# Patient Record
Sex: Female | Born: 1980 | Race: White | Hispanic: No | Smoking: Current every day smoker
Health system: Southern US, Academic
[De-identification: ages and names within clinical notes are randomized; demographics above are authoritative.]

## PROBLEM LIST (undated history)

## (undated) DIAGNOSIS — IMO0002 Reserved for concepts with insufficient information to code with codable children: Secondary | ICD-10-CM

## (undated) DIAGNOSIS — R51 Headache: Secondary | ICD-10-CM

## (undated) DIAGNOSIS — B019 Varicella without complication: Secondary | ICD-10-CM

## (undated) DIAGNOSIS — N390 Urinary tract infection, site not specified: Secondary | ICD-10-CM

## (undated) DIAGNOSIS — J309 Allergic rhinitis, unspecified: Secondary | ICD-10-CM

## (undated) DIAGNOSIS — F419 Anxiety disorder, unspecified: Secondary | ICD-10-CM

## (undated) DIAGNOSIS — Q796 Ehlers-Danlos syndrome, unspecified: Secondary | ICD-10-CM

## (undated) DIAGNOSIS — G43909 Migraine, unspecified, not intractable, without status migrainosus: Secondary | ICD-10-CM

## (undated) DIAGNOSIS — K219 Gastro-esophageal reflux disease without esophagitis: Secondary | ICD-10-CM

## (undated) DIAGNOSIS — R519 Headache, unspecified: Secondary | ICD-10-CM

## (undated) DIAGNOSIS — M1990 Unspecified osteoarthritis, unspecified site: Secondary | ICD-10-CM

## (undated) DIAGNOSIS — T8853XA Unintended awareness under general anesthesia during procedure, initial encounter: Secondary | ICD-10-CM

## (undated) DIAGNOSIS — G90A Postural orthostatic tachycardia syndrome (POTS): Secondary | ICD-10-CM

## (undated) DIAGNOSIS — H9190 Unspecified hearing loss, unspecified ear: Secondary | ICD-10-CM

## (undated) DIAGNOSIS — H5111 Convergence insufficiency: Secondary | ICD-10-CM

## (undated) DIAGNOSIS — Z973 Presence of spectacles and contact lenses: Secondary | ICD-10-CM

## (undated) DIAGNOSIS — Z87898 Personal history of other specified conditions: Secondary | ICD-10-CM

## (undated) DIAGNOSIS — K3184 Gastroparesis: Secondary | ICD-10-CM

## (undated) DIAGNOSIS — N809 Endometriosis, unspecified: Secondary | ICD-10-CM

## (undated) DIAGNOSIS — M419 Scoliosis, unspecified: Secondary | ICD-10-CM

## (undated) DIAGNOSIS — G8929 Other chronic pain: Secondary | ICD-10-CM

## (undated) DIAGNOSIS — G901 Familial dysautonomia [Riley-Day]: Secondary | ICD-10-CM

## (undated) DIAGNOSIS — E041 Nontoxic single thyroid nodule: Secondary | ICD-10-CM

## (undated) DIAGNOSIS — D8989 Other specified disorders involving the immune mechanism, not elsewhere classified: Secondary | ICD-10-CM

## (undated) DIAGNOSIS — M530 Cervicocranial syndrome: Secondary | ICD-10-CM

## (undated) DIAGNOSIS — R0602 Shortness of breath: Secondary | ICD-10-CM

## (undated) DIAGNOSIS — R6889 Other general symptoms and signs: Secondary | ICD-10-CM

## (undated) DIAGNOSIS — G935 Compression of brain: Secondary | ICD-10-CM

## (undated) HISTORY — DX: Unspecified osteoarthritis, unspecified site: M19.90

## (undated) HISTORY — DX: Gastro-esophageal reflux disease without esophagitis: K21.9

## (undated) HISTORY — PX: MYOMECTOMY: SHX85

## (undated) HISTORY — DX: Reserved for concepts with insufficient information to code with codable children: IMO0002

## (undated) HISTORY — DX: Anxiety disorder, unspecified: F41.9

## (undated) HISTORY — DX: Headache: R51

## (undated) HISTORY — DX: Urinary tract infection, site not specified: N39.0

## (undated) HISTORY — DX: Migraine, unspecified, not intractable, without status migrainosus: G43.909

## (undated) HISTORY — DX: Varicella without complication: B01.9

## (undated) HISTORY — DX: Headache, unspecified: R51.9

## (undated) HISTORY — DX: Ehlers-Danlos syndrome, unspecified: Q79.60

## (undated) HISTORY — PX: CERVICAL POLYPECTOMY: SHX88

## (undated) HISTORY — PX: OTHER SURGICAL HISTORY: SHX169

## (undated) HISTORY — DX: Allergic rhinitis, unspecified: J30.9

## (undated) HISTORY — DX: Cervicocranial syndrome: M53.0

## (undated) HISTORY — PX: ESOPHAGEAL DILATION: SHX303

## (undated) HISTORY — PX: HX MYOMECTOMY: SHX85

## (undated) HISTORY — PX: HX WISDOM TEETH EXTRACTION: SHX21

## (undated) HISTORY — DX: Unspecified hearing loss, unspecified ear: H91.90

## (undated) HISTORY — DX: Endometriosis, unspecified: N80.9

## (undated) HISTORY — PX: HX LAP CHOLECYSTECTOMY: SHX56

## (undated) HISTORY — PX: POSTERIOR FOSSA DECOMPRESSION: SHX2249

## (undated) HISTORY — DX: Scoliosis, unspecified: M41.9

## (undated) HISTORY — DX: Nontoxic single thyroid nodule: E04.1

---
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
## 2000-04-16 ENCOUNTER — Other Ambulatory Visit: Admission: RE | Admit: 2000-04-16 | Discharge: 2000-04-16 | Payer: Self-pay | Admitting: *Deleted

## 2000-04-17 ENCOUNTER — Other Ambulatory Visit: Admission: RE | Admit: 2000-04-17 | Discharge: 2000-04-17 | Payer: Self-pay | Admitting: *Deleted

## 2000-05-29 ENCOUNTER — Ambulatory Visit (HOSPITAL_COMMUNITY): Admission: RE | Admit: 2000-05-29 | Discharge: 2000-05-29 | Payer: Self-pay | Admitting: *Deleted

## 2000-07-16 ENCOUNTER — Ambulatory Visit: Admission: RE | Admit: 2000-07-16 | Discharge: 2000-07-16 | Payer: Self-pay | Admitting: Gynecology

## 2000-09-17 ENCOUNTER — Other Ambulatory Visit: Admission: RE | Admit: 2000-09-17 | Discharge: 2000-09-17 | Payer: Self-pay | Admitting: Gynecology

## 2000-09-17 ENCOUNTER — Ambulatory Visit: Admission: RE | Admit: 2000-09-17 | Discharge: 2000-09-17 | Payer: Self-pay | Admitting: Gynecology

## 2001-01-27 ENCOUNTER — Other Ambulatory Visit: Admission: RE | Admit: 2001-01-27 | Discharge: 2001-01-27 | Payer: Self-pay | Admitting: Gynecology

## 2001-01-27 ENCOUNTER — Ambulatory Visit: Admission: RE | Admit: 2001-01-27 | Discharge: 2001-01-27 | Payer: Self-pay | Admitting: Gynecology

## 2001-05-19 ENCOUNTER — Other Ambulatory Visit: Admission: RE | Admit: 2001-05-19 | Discharge: 2001-05-19 | Payer: Self-pay | Admitting: Gynecologic Oncology

## 2001-05-19 ENCOUNTER — Ambulatory Visit: Admission: RE | Admit: 2001-05-19 | Discharge: 2001-05-19 | Payer: Self-pay | Admitting: Gynecologic Oncology

## 2001-11-04 ENCOUNTER — Ambulatory Visit: Admission: RE | Admit: 2001-11-04 | Discharge: 2001-11-04 | Payer: Self-pay | Admitting: Gynecology

## 2001-11-04 ENCOUNTER — Other Ambulatory Visit: Admission: RE | Admit: 2001-11-04 | Discharge: 2001-11-04 | Payer: Self-pay | Admitting: Gynecology

## 2002-06-16 ENCOUNTER — Ambulatory Visit: Admission: RE | Admit: 2002-06-16 | Discharge: 2002-06-16 | Payer: Self-pay | Admitting: Gynecology

## 2002-06-16 ENCOUNTER — Other Ambulatory Visit: Admission: RE | Admit: 2002-06-16 | Discharge: 2002-06-16 | Payer: Self-pay | Admitting: Gynecology

## 2005-05-16 ENCOUNTER — Emergency Department: Payer: Self-pay | Admitting: Unknown Physician Specialty

## 2005-05-18 ENCOUNTER — Ambulatory Visit: Payer: Self-pay | Admitting: Unknown Physician Specialty

## 2006-02-24 ENCOUNTER — Ambulatory Visit: Payer: Self-pay | Admitting: Gastroenterology

## 2006-03-11 ENCOUNTER — Ambulatory Visit: Payer: Self-pay | Admitting: Gastroenterology

## 2006-03-26 ENCOUNTER — Ambulatory Visit: Payer: Self-pay | Admitting: Gastroenterology

## 2007-11-24 ENCOUNTER — Ambulatory Visit: Payer: Self-pay | Admitting: Obstetrics and Gynecology

## 2007-11-27 ENCOUNTER — Ambulatory Visit: Payer: Self-pay | Admitting: Obstetrics and Gynecology

## 2012-09-01 DIAGNOSIS — R634 Abnormal weight loss: Secondary | ICD-10-CM | POA: Insufficient documentation

## 2012-09-01 DIAGNOSIS — N96 Recurrent pregnancy loss: Secondary | ICD-10-CM | POA: Insufficient documentation

## 2012-09-01 DIAGNOSIS — M2550 Pain in unspecified joint: Secondary | ICD-10-CM | POA: Insufficient documentation

## 2014-04-05 NOTE — Telephone Encounter (Signed)
Returned pt call. 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 She has been referred to an EDS specialist near [facility], but the waiting list is 23 months. She is wondering if there is any EDS specialist closer that may be able to see her before her scheduled decompression surgery with her neurosurgeon next month. I discussed that I was not aware of anyone in the area that specializes in EDS. I would be happy to research this for her, but I would recommend formal genetic testing first. Will refer to genetics here at [facility] for this.

## 2014-06-17 DIAGNOSIS — Q796 Ehlers-Danlos syndrome, unspecified: Secondary | ICD-10-CM

## 2014-06-17 DIAGNOSIS — Q828 Other specified congenital malformations of skin: Secondary | ICD-10-CM | POA: Insufficient documentation

## 2014-11-23 ENCOUNTER — Ambulatory Visit (INDEPENDENT_AMBULATORY_CARE_PROVIDER_SITE_OTHER): Payer: BLUE CROSS/BLUE SHIELD | Admitting: Family Medicine

## 2014-11-23 ENCOUNTER — Encounter: Payer: Self-pay | Admitting: Family Medicine

## 2014-11-23 VITALS — BP 116/82 | HR 91 | Temp 98.5°F | Ht 64.17 in | Wt 104.2 lb

## 2014-11-23 DIAGNOSIS — R109 Unspecified abdominal pain: Secondary | ICD-10-CM

## 2014-11-23 DIAGNOSIS — M62838 Other muscle spasm: Secondary | ICD-10-CM | POA: Diagnosis not present

## 2014-11-23 DIAGNOSIS — R51 Headache: Secondary | ICD-10-CM

## 2014-11-23 DIAGNOSIS — R258 Other abnormal involuntary movements: Secondary | ICD-10-CM

## 2014-11-23 DIAGNOSIS — R519 Headache, unspecified: Secondary | ICD-10-CM

## 2014-11-23 DIAGNOSIS — Q796 Ehlers-Danlos syndrome, unspecified: Secondary | ICD-10-CM

## 2014-11-23 DIAGNOSIS — R253 Fasciculation: Secondary | ICD-10-CM

## 2014-11-23 DIAGNOSIS — R002 Palpitations: Secondary | ICD-10-CM | POA: Diagnosis not present

## 2014-11-23 DIAGNOSIS — R350 Frequency of micturition: Secondary | ICD-10-CM | POA: Diagnosis not present

## 2014-11-23 DIAGNOSIS — G8929 Other chronic pain: Secondary | ICD-10-CM

## 2014-11-23 LAB — POCT URINALYSIS DIPSTICK
Bilirubin, UA: NEGATIVE
Glucose, UA: NEGATIVE
Ketones, UA: NEGATIVE
LEUKOCYTES UA: NEGATIVE
NITRITE UA: NEGATIVE
PH UA: 6
PROTEIN UA: NEGATIVE
Spec Grav, UA: 1.02
UROBILINOGEN UA: 0.2

## 2014-11-23 LAB — BASIC METABOLIC PANEL
BUN: 17 mg/dL (ref 6–23)
CHLORIDE: 106 meq/L (ref 96–112)
CO2: 31 meq/L (ref 19–32)
CREATININE: 0.68 mg/dL (ref 0.40–1.20)
Calcium: 9.2 mg/dL (ref 8.4–10.5)
GFR: 104.85 mL/min (ref >60.00)
GLUCOSE: 79 mg/dL (ref 70–99)
Potassium: 4 mEq/L (ref 3.5–5.1)
Sodium: 141 mEq/L (ref 135–145)

## 2014-11-23 LAB — URINALYSIS, MICROSCOPIC ONLY

## 2014-11-23 LAB — POCT URINE PREGNANCY: PREG TEST UR: NEGATIVE

## 2014-11-23 NOTE — Progress Notes (Signed)
Pre visit review using our clinic review tool, if applicable. No additional management support is needed unless otherwise documented below in the visit note. 

## 2014-11-23 NOTE — Progress Notes (Signed)
Patient ID: Belinda Bryant, female   DOB: [birth year], 34 y.o.   MRN: [id number]  Belinda Rumps, MD Phone: [phone]  Belinda Bryant is a 34 y.o. female who presents today for new patient visit.   Patient notes a number of chronic issues today. These are unchanged in many years. She notes the only new issue is muscle twitching in her right arm that occurred several weeks ago and lasted for 4 days. She notes this occurred following being in bright lights in a store. She notes she feels like this is going to come on when she is in stores since that time. Has not recurred in several days. She noted a numb sensation in her posterior neck with this. She notes no numbness since the initial episode. No pain with this. No LOC. No generalized shaking. No post ictal issues. No incontinence with this.   Notes she has a history of chiari malformation. Has had headaches for decades and was recently diagnosed with chiari. 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 She notes constant headaches with this. She has had balance issues in the past. Bright lights bother her. She has been on fiorecet for headaches with some benefit. She has been to a nutritionist who tested a number of vitamin and minerals. Has anxiety and gets palpitations with this. No CP or SOB with this. Notes she gets flushed intermittently on standing and has light headedness on standing. 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 Had an echo that reportedly was normal. Has seen geneticist for her issues. Notes swallowing issues with solids. Has had EGD and colo, gastric emptying study, and barium swallow that were reportedly normal. Notes chronic abdominal pain due to fibroids and endometriosis. Has history of painful intercourse. Has seen GI and Gyn for these issues. Reports having CT abd/pelvis and [place] to  evaluate this with no apparent cause found. She notes all of these issues are chronic and stable. She is her today to meet a new PCP and for assistance in coordination of care.   Active Ambulatory Problems    Diagnosis Date Noted  . Muscle twitching 11/23/2014  . Headache 11/24/2014  . Chronic abdominal pain 11/24/2014  . Ehlers-Danlos disease 11/24/2014  . Palpitations 11/24/2014   Resolved Ambulatory Problems    Diagnosis Date Noted  . No Resolved Ambulatory Problems   Past Medical History  Diagnosis Date  . Arthritis   . Chicken pox   . Chiari malformation   . GERD (gastroesophageal reflux disease)   . Allergic rhinitis   . Migraine   . UTI (lower urinary tract infection)     Family History  Problem Relation Age of Onset  . Arthritis      parents, gradnparents, siblings  . Lung cancer      grandparent  . Hypertension      parent    Social History   Social History  . Marital Status: Single    Spouse Name: N/A  . Number of Children: N/A  . Years of Education: N/A   Occupational History  . Not on file.   Social History Main Topics  . Smoking status: Current Every Day Smoker  . Smokeless tobacco: Not on file  . Alcohol Use: No  . Drug Use: Yes     Comment: Marijuana  . Sexual Activity: Not on file  Other Topics Concern  . Not on file   Social History Narrative    ROS   General:  Positive for unexplained weight loss, Negative for fever Skin: Negative for new or changing mole, sore that won't heal HEENT: positive for trouble hearing, trouble seeing, ringing in ears, mouth sores, change in voice, dysphagia. CV:  Negative for chest pain, dyspnea, positive for palpitations Resp: positive for cough, Negative for dyspnea, hemoptysis GI: positive for nausea and abdominal pain, Negative for vomiting, diarrhea, constipation, melena, hematochezia. GU: positive for frequent urination, sexual difficulty, Negative for dysuria, incontinence, urinary hesitance,  hematuria, vaginal or penile discharge, polyuria, lumps in testicle or breasts MSK: positive for muscle cramps or aches, joint pain or swelling Neuro: Positive for headaches, weakness, numbness, dizziness, negative for passing out/fainting Psych: Negative for depression, positive for anxiety, memory problems  Objective  Physical Exam Filed Vitals:   11/23/14 0858  BP: 116/82  Pulse: 91  Temp: 98.5 F (36.9 C)   Lying BP 102/70 P 53 Sitting BP 96/68 P 57 Standing BP 96/68 P 53  Physical Exam  Constitutional:  Well appearing, thin, NAD  HENT:  Head: Normocephalic and atraumatic.  Right Ear: External ear normal.  Left Ear: External ear normal.  Mouth/Throat: No oropharyngeal exudate.  Eyes: Conjunctivae are normal. Pupils are equal, round, and reactive to light.  Neck: Neck supple.  Cardiovascular: Normal rate, regular rhythm and normal heart sounds.  Exam reveals no gallop and no friction rub.   No murmur heard. Pulmonary/Chest: Effort normal and breath sounds normal. No respiratory distress. She has no wheezes. She has no rales.  Abdominal: Soft. Bowel sounds are normal. She exhibits no distension and no mass. There is no tenderness. There is no rebound and no guarding.  Lymphadenopathy:    She has no cervical adenopathy.  Neurological: She is alert. Gait normal.  CN 2-12 intact, 5/5 strength in bilateral biceps, triceps, grip, quads, hamstrings, plantar and dorsiflexion, sensation to light touch intact in bilateral UE and LE, normal gait, 2+ patellar reflexes  Skin: Skin is warm and dry.  Psychiatric:  Mood anxious, affect anxious  Extremities WWP  EKG: sinus bradycardia, rate 52, no ST or T wave changes  Assessment/Plan:   Muscle twitching Patient with episode of muscle twitching recently. Mostly in RUE, though some in LUE. No other abnormal movements. No tonic clonic movements. No LOC. No post ictal phase. Unlikely seizure activity. Could be dehydration related or  electrolyte imbalance. Could be related to her chiari malformation. Neurologically intact. Will check BMET. Will refer to neurology for management of her headaches and for further evaluation of these twitches.   Headache Likely related to chiari malformation. Is neurologically intact. Will request records from [facility] from her neurosurgeon. Will refer to neurology at [facility] for further management of headaches. Given return precautions.   Chronic abdominal pain Chronic issue. Sounds like she has had an extensive work up for this with endoscopy, colonoscopy, CT scan, and [place]. Has seen GI and Gyn for this and has had several pelvic operations for fibroids and polyps. Also has swallowing issues, though has had extensive work up with barium swallow and gastric emptying study. Could be related to [employer]. UA with no signs of infection. Trace blood likely related to patient being on her period. Upreg negative. Benign exam today. Patient would like to continue to monitor this and consider referral to GI in the future. Given return precautions.   Ehlers-Danlos disease Has been evaluated by genetics  already. Will refer to Dr Oneida Alar at [facility] in [place] for further management of this issue.   Palpitations Occur mostly when anxious. Likely related to anxiety. No other cardiac symptoms. EKG with sinus brady, otherwise reassuring. 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 Discussed further evaluation with cardiology for this issue, though patient declined this opting to monitor and if remains persistent by her next visit she will consider referral. Given return precautions.     Orders Placed This Encounter  Procedures  . Basic Metabolic Panel (BMET)  . Urine Microscopic Only  . Ambulatory referral to Sports Medicine    Referral Priority:  Routine    Referral Type:  Consultation    Number of Visits Requested:  1  . Ambulatory referral to Neurology    Referral Priority:  Routine    Referral Type:   Consultation    Referral Reason:  Specialty Services Required    Requested Specialty:  Neurology    Number of Visits Requested:  1  . POCT Urinalysis Dipstick  . POCT urine pregnancy  . EKG 12-Lead    Belinda Bryant

## 2014-11-23 NOTE — Patient Instructions (Signed)
Nice to meet you. We will refer you to neurology for further management of your headaches and for further evaluation of your chiari malformation.  We will refer you to Dr Oneida Alar in [place] for your [employer]. If you have a change in your symptoms, nausea, vomiting, diarrhea, blood in your stool, abdominal pain, worsening headache, numbness, weakness, vision changes, tremor, shaking, loss of consciousness, vaginal discharge, or vaginal bleeding, or fever, please seek medical attention.

## 2014-11-24 ENCOUNTER — Encounter: Payer: Self-pay | Admitting: Family Medicine

## 2014-11-24 DIAGNOSIS — R109 Unspecified abdominal pain: Secondary | ICD-10-CM

## 2014-11-24 DIAGNOSIS — R002 Palpitations: Secondary | ICD-10-CM | POA: Insufficient documentation

## 2014-11-24 DIAGNOSIS — Q796 Ehlers-Danlos syndrome, unspecified: Secondary | ICD-10-CM | POA: Insufficient documentation

## 2014-11-24 DIAGNOSIS — R519 Headache, unspecified: Secondary | ICD-10-CM | POA: Insufficient documentation

## 2014-11-24 DIAGNOSIS — R51 Headache: Secondary | ICD-10-CM

## 2014-11-24 DIAGNOSIS — G8929 Other chronic pain: Secondary | ICD-10-CM | POA: Insufficient documentation

## 2014-11-24 NOTE — Assessment & Plan Note (Signed)
Has been evaluated by genetics already. Will refer to Dr Oneida Alar at [facility] in [place] for further management of this issue.

## 2014-11-24 NOTE — Assessment & Plan Note (Signed)
Likely related to chiari malformation. Is neurologically intact. Will request records from [facility] from her neurosurgeon. Will refer to neurology at [facility] for further management of headaches. Given return precautions.

## 2014-11-24 NOTE — Assessment & Plan Note (Signed)
Occur mostly when anxious. Likely related to anxiety. No other cardiac symptoms. EKG with sinus brady, otherwise reassuring. 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 Discussed further evaluation with cardiology for this issue, though patient declined this opting to monitor and if remains persistent by her next visit she will consider referral. Given return precautions.

## 2014-11-24 NOTE — Assessment & Plan Note (Signed)
Patient with episode of muscle twitching recently. Mostly in RUE, though some in LUE. No other abnormal movements. No tonic clonic movements. No LOC. No post ictal phase. Unlikely seizure activity. Could be dehydration related or electrolyte imbalance. Could be related to her chiari malformation. Neurologically intact. Will check BMET. Will refer to neurology for management of her headaches and for further evaluation of these twitches.

## 2014-11-24 NOTE — Assessment & Plan Note (Addendum)
Chronic issue. Sounds like she has had an extensive work up for this with endoscopy, colonoscopy, CT scan, and [place]. Has seen GI and Gyn for this and has had several pelvic operations for fibroids and polyps. Also has swallowing issues, though has had extensive work up with barium swallow and gastric emptying study. Could be related to [employer]. UA with no signs of infection. Trace blood likely related to patient being on her period. Upreg negative. Benign exam today. Patient would like to continue to monitor this and consider referral to GI in the future. Given return precautions.

## 2014-12-07 ENCOUNTER — Ambulatory Visit (INDEPENDENT_AMBULATORY_CARE_PROVIDER_SITE_OTHER): Payer: BLUE CROSS/BLUE SHIELD | Admitting: Family Medicine

## 2014-12-07 ENCOUNTER — Encounter: Payer: Self-pay | Admitting: Family Medicine

## 2014-12-07 VITALS — BP 96/58 | HR 75 | Temp 98.7°F | Ht 64.17 in | Wt 102.0 lb

## 2014-12-07 DIAGNOSIS — R519 Headache, unspecified: Secondary | ICD-10-CM

## 2014-12-07 DIAGNOSIS — R258 Other abnormal involuntary movements: Secondary | ICD-10-CM

## 2014-12-07 DIAGNOSIS — R253 Fasciculation: Secondary | ICD-10-CM

## 2014-12-07 DIAGNOSIS — M62838 Other muscle spasm: Secondary | ICD-10-CM | POA: Diagnosis not present

## 2014-12-07 DIAGNOSIS — E559 Vitamin D deficiency, unspecified: Secondary | ICD-10-CM | POA: Diagnosis not present

## 2014-12-07 DIAGNOSIS — Q796 Ehlers-Danlos syndrome, unspecified: Secondary | ICD-10-CM

## 2014-12-07 DIAGNOSIS — R05 Cough: Secondary | ICD-10-CM

## 2014-12-07 DIAGNOSIS — R51 Headache: Secondary | ICD-10-CM

## 2014-12-07 DIAGNOSIS — R059 Cough, unspecified: Secondary | ICD-10-CM

## 2014-12-07 DIAGNOSIS — J309 Allergic rhinitis, unspecified: Secondary | ICD-10-CM | POA: Insufficient documentation

## 2014-12-07 DIAGNOSIS — G8929 Other chronic pain: Secondary | ICD-10-CM

## 2014-12-07 DIAGNOSIS — F419 Anxiety disorder, unspecified: Secondary | ICD-10-CM

## 2014-12-07 LAB — VITAMIN D 25 HYDROXY (VIT D DEFICIENCY, FRACTURES): VITD: 25.36 ng/mL — AB (ref 30.00–100.00)

## 2014-12-07 LAB — MAGNESIUM: MAGNESIUM: 2.1 mg/dL (ref 1.5–2.5)

## 2014-12-07 LAB — IRON: Iron: 35 ug/dL — ABNORMAL LOW (ref 42–145)

## 2014-12-07 MED ORDER — CLONAZEPAM 1 MG PO TABS: 1.0000 mg | ORAL_TABLET | Freq: Two times a day (BID) | ORAL | Status: DC | PRN

## 2014-12-07 MED ORDER — CLONAZEPAM 0.5 MG PO TABS: 0.5000 mg | ORAL_TABLET | Freq: Two times a day (BID) | ORAL | Status: DC | PRN

## 2014-12-07 NOTE — Assessment & Plan Note (Signed)
Check vitamin D today. 

## 2014-12-07 NOTE — Assessment & Plan Note (Signed)
No recurrence. We'll check magnesium and iron level.

## 2014-12-07 NOTE — Assessment & Plan Note (Signed)
Stable at this time. Patient has an appointment next month with sports medicine. She did ask whether or not she should wear her soft neck brace. I advised her that she should wear this and that she should contact her neurosurgeon to obtain their advice on this and for further recommendations on this. Advised that she should wear the soft neck brace until she heard back from the neurosurgeon. She was given return precautions.

## 2014-12-07 NOTE — Assessment & Plan Note (Addendum)
This is a persistent intermittent issue that only occurs at night. She has had benefit from Prilosec and Zantac in the past. She's not taking these at this time. She has no shortness of breath with this. She does note an incline has helped. I did have a discussion with patient regarding possible workup of this considering that she does not feel well rested in the morning. We did discuss sleep study though she declined this at this time opting to monitor. Discussed Prilosec and Zantac for this though she preferred to continue to monitor at this time as the inclined bed has helped her. Given return precautions.

## 2014-12-07 NOTE — Assessment & Plan Note (Signed)
Stable at this time. Based on PHQ 9 scoring she does appear to have some level of depression with this. She has been tried on SSRIs previously and they've not been beneficial. Discussed that there are other options for treatment, though patient opted to continue with Klonopin and therapy. She has no SI or HI at this time. Refill given for Klonopin 1 mg twice a day when necessary for anxiety. Given return precautions.

## 2014-12-07 NOTE — Patient Instructions (Addendum)
Nice to see you. We will attempt to get you in to a neurologist sooner than you February appointment.  Please call your neurosurgeon at [facility] to ask about the neck brace. I would favor wearing this until you discuss with them.  Please keep follow-up with the therapist.  If you develop worsening headaches, neck pain, numbness, weakness, tingling, nausea, vomiting, thoughts of harming yourself or others, or any new symptoms please seek medical attention.

## 2014-12-07 NOTE — Progress Notes (Signed)
Pre visit review using our clinic review tool, if applicable. No additional management support is needed unless otherwise documented below in the visit note. 

## 2014-12-07 NOTE — Assessment & Plan Note (Addendum)
Continues to have persistent headache. She has no acute neurologic changes and notes that the changes in sensation in her fingertips and toes and left thigh have been chronic issues for greater than a year. She had an appointment scheduled with neurology though this is in February and patient and I would prefer that she be seen sooner than that. We will refer her to [facility] to see if they can get her in sooner and if not we'll refer her to local neurologist for further evaluation. She will continue Fioricet as needed for headaches. She was given return precautions.

## 2014-12-07 NOTE — Progress Notes (Signed)
Patient ID: Belinda Bryant, female   DOB: November 03, 1980, 34 y.o.   MRN: [id number]  [name], MD Phone: [phone]  Belinda Bryant is a 34 y.o. female who presents today for follow-up.  Headaches: Patient notes long history of these related to her Chiari malformation. These are chronic in nature. She's had decade's worth of headaches. She notes her headaches have progressively gotten worse over a number of years. She notes today that are stable compared to her last visit. She does note continued sensory issues with regards to light. She denies any more twitching episodes. She denies any numbness or weakness, though then she states there is an area in her left thigh that has been decreased in sensation for years and that she had an injection in that thigh that resulted in this decreased sensation. She notes this area has been improving. She also notes that her fingertips and toes feel cold and intermittently numb bilaterally. This is been an issue for greater than a year. She denies any numbness anywhere else. She has been taking Fioricet for her headaches with some benefit. She notes the soonest they could get her in to [facility] for a neurology appointment was 03/20/2015.  Ehlers-Danlos syndrome: Patient notes no issues with dislocation with regards to this. She does note her neck pops if she moves it though has not become unstable. She does not note any increased laxity in her neck. She notes she's been wearing a soft collar when she is sleeping and this has helped with her headaches. She notes no neck pain. She notes no radiation of the popping sensation. Notes she has an appointment with sports medicine for evaluation of this next month. She wonders if she should be wearing a soft collar all the time.  Anxiety: she notes this as a constant issue. She takes half a milligram of Klonopin 1-2 times a day though occasionally will take 1 mg 1-2 times a day. Her prescription is for 1 mg twice  a day. She started seeing a therapist. She notes she feels this will help with her anxiety. She does not feel depressed. She has tried SSRIs in the past and has not reacted well to them. She has no HI. She has had side effects in the past with the last episode greater than a year ago. She's never had an intent or plan to harm herself. She needs a refill on Klonopin. PHQ 9 score 11 GAD 7 score 15  Vitamin D deficiency: She was diagnosed with this at her last PCPs office. They started her on 50,000 IUs per week to replace this. She would like this rechecked today.  Additionally notes having episodes where she wakes up at night coughing. She notes her prior PCP started her on Prilosec and Zantac for this and she had some improvement in this. She does note the last EGD she did not reveal any signs of reflux. Her fianc bought her an inclined bed and this has helped significantly with this issue. She denies any apneic episodes and snoring. She does not wake well rested. She will not fall asleep at a stoplight. She will fall sleep in front of the TV. She states it has been suggested to her in the past that she obtain a sleep study though she has not done this yet. Denies reflux symptoms at this time.  Patient does additionally note nasal and sinus congestion intermittently. She notes this is a persistent issue for number of years. She notes no other symptoms  with this. She has just recently started on Zantac and notes this is helped some. She has tried Flonase in the past of this is made her jittery.  PMH: Smoker   ROS see history of present illness  Objective  Physical Exam Filed Vitals:   12/07/14 0956  BP: 96/58  Pulse: 75  Temp: 98.7 F (37.1 C)    Physical Exam  Constitutional: She is well-developed, well-nourished, and in no distress.  HENT:  Head: Normocephalic and atraumatic.  Right Ear: External ear normal.  Left Ear: External ear normal.  Mouth/Throat: Oropharynx is clear and  moist. No oropharyngeal exudate.  Normal TMs bilaterally  Eyes: Conjunctivae are normal. Pupils are equal, round, and reactive to light.  Cardiovascular: Normal rate, regular rhythm and normal heart sounds.  Exam reveals no gallop and no friction rub.   No murmur heard. Pulmonary/Chest: Effort normal and breath sounds normal. No respiratory distress. She has no wheezes. She has no rales.  Musculoskeletal: She exhibits no edema.  No midline neck or spine tenderness, full range of motion to neck with no apparent laxity on rotational and flexion and extension movements, she did have a popping noise with range of motion, there is no muscular tenderness in her neck  Neurological: She is alert.  CN 2-12 intact, 5/5 strength in bilateral biceps, triceps, grip, quads, hamstrings, plantar and dorsiflexion, sensation to light touch mildly diminished in left anterior and lateral thigh, and bilateral toes and fingers (patient notes these are chronic issues with sensation that are unchanged recently), otherwise intact in bilateral UE and LE, normal gait, 2+ patellar reflexes  Skin: Skin is warm and dry. She is not diaphoretic.  Psychiatric:  Intermittently tearful     Assessment/Plan: Please see individual problem list.  Ehlers-Danlos disease Stable at this time. Patient has an appointment next month with sports medicine. She did ask whether or not she should wear her soft neck brace. I advised her that she should wear this and that she should contact her neurosurgeon to obtain their advice on this and for further recommendations on this. Advised that she should wear the soft neck brace until she heard back from the neurosurgeon. She was given return precautions.  Headache Continues to have persistent headache. She has no acute neurologic changes and notes that the changes in sensation in her fingertips and toes and left thigh have been chronic issues for greater than a year. She had an appointment  scheduled with neurology though this is in February and patient and I would prefer that she be seen sooner than that. We will refer her to [facility] to see if they can get her in sooner and if not we'll refer her to local neurologist for further evaluation. She will continue Fioricet as needed for headaches. She was given return precautions.  Muscle twitching No recurrence. We'll check magnesium and iron level.  Allergic rhinitis Nasal symptoms likely related to allergic rhinitis. Patient was recently started on Zyrtec. She'll continue this and we will monitor for improvement. Given return precautions.  Cough This is a persistent intermittent issue that only occurs at night. She has had benefit from Prilosec and Zantac in the past. She's not taking these at this time. She has no shortness of breath with this. She does note an incline has helped. I did have a discussion with patient regarding possible workup of this considering that she does not feel well rested in the morning. We did discuss sleep study though she declined this at  this time opting to monitor. Discussed Prilosec and Zantac for this though she preferred to continue to monitor at this time as the inclined bed has helped her. Given return precautions.  Vitamin D deficiency Check vitamin D today.  Anxiety Stable at this time. Based on PHQ 9 scoring she does appear to have some level of depression with this. She has been tried on SSRIs previously and they've not been beneficial. Discussed that there are other options for treatment, though patient opted to continue with Klonopin and therapy. She has no SI or HI at this time. Refill given for Klonopin 1 mg twice a day when necessary for anxiety. Given return precautions.    Orders Placed This Encounter  Procedures  . Magnesium  . Vitamin D (25 hydroxy)  . Iron    [name]

## 2014-12-07 NOTE — Assessment & Plan Note (Signed)
Nasal symptoms likely related to allergic rhinitis. Patient was recently started on Zyrtec. She'll continue this and we will monitor for improvement. Given return precautions.

## 2014-12-13 ENCOUNTER — Other Ambulatory Visit: Payer: Self-pay | Admitting: Family Medicine

## 2014-12-13 MED ORDER — VITAMIN D3 1.25 MG (50000 UT) PO CAPS: 1.0000 | ORAL_CAPSULE | ORAL | Status: AC

## 2014-12-27 ENCOUNTER — Ambulatory Visit (INDEPENDENT_AMBULATORY_CARE_PROVIDER_SITE_OTHER): Payer: BLUE CROSS/BLUE SHIELD | Admitting: Neurology

## 2014-12-27 ENCOUNTER — Encounter: Payer: Self-pay | Admitting: Neurology

## 2014-12-27 VITALS — BP 104/60 | HR 89 | Ht 64.0 in | Wt 104.3 lb

## 2014-12-27 DIAGNOSIS — G935 Compression of brain: Secondary | ICD-10-CM | POA: Diagnosis not present

## 2014-12-27 DIAGNOSIS — Q796 Ehlers-Danlos syndrome, unspecified: Secondary | ICD-10-CM

## 2014-12-27 DIAGNOSIS — G43719 Chronic migraine without aura, intractable, without status migrainosus: Secondary | ICD-10-CM | POA: Diagnosis not present

## 2014-12-27 MED ORDER — PREDNISONE 10 MG PO TABS: ORAL_TABLET | ORAL | Status: DC

## 2014-12-27 NOTE — Progress Notes (Signed)
NEUROLOGY CONSULTATION NOTE  Belinda Bryant MRN: [id number] DOB: 08-24-1980  Referring provider: Dr. Caryl Bis Primary care provider: Dr. 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  History obtained by patient, her sister and PCP note.  Labs and MRI of brain and spinal cord MRI reviewed.  She has suffered from headaches since childhood.  They are holocephalic, pressure-like 123456 pain, in back of head and traveling in band like distribution.  There is associated nausea, photophobia, phonophobia, osmophobia.  It is constant.  For many years, she has taken Excedrin and Fioricet daily, which only dulls the pain.  Coughing, laughing, bending over, or sneezing triggers it.  Any photic or phonic stimulation is distressing.  Heating pads or ice packs help.  She cannot function.  She has tried multiple anti-depressants in the past, such as Effexor, Zoloft, Cymbalta, and amitriptyline, all with side effects and were ineffective.  She says topiramate is not an option as she already has poor appetite and weight loss and beta blockers are not an option because she has baseline low-blood pressure.  She also has significant lightheadedness and dizziness.  She feels unsteady on her feet.  She notes blurred vision.  She notes trouble with swallowing liquids, food and pills.  She sometimes wakes up at night choking.  She also reports problems with short term memory.  She will quickly forget conversations.  She also reports difficulty with spelling and recalling definitions of words.  She sometimes slurs her words.  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  She has not worked for a year due to her medical conditions.  When she gets episodes of lightheadedness and sensory overload, she sometimes has tremor.  Once, it  lasted a couple of days and she was unable to control it.  MRI of brain, cervical and thoracic spines from 05/17/14 showed "5-6 mm cerebellar tonsillar descent below the level of the foramen magnum" with no ventriculomegaly or syringohydromyelia.  Also, "patent fluid signal and biphasic flow is visualized within the cerebral aqueduct and foramen of Magendie."  This is apparently stable compared to prior imaging in October 2015.  Imaging of the cervical spine showed mild disc bulges but without significant central or foraminal stenosis.  She was evaluated by multiple specialists, including neurosurgery, dermatology, rheumatology, and GI.  She was seen by Dr. Ronalee Red, a neurosurgeon at [facility].  Due to hypermobile joints, she tested positive for Ehlers-Danlos syndrome.  As a result, Dr. Ronalee Red did not feel she was a surgical candidate.  Multiple labs, including hypercoagulable panel, lupus, Sed rate, ANA, HIV, and TSH, were unremarkable.  PAST MEDICAL HISTORY: Past Medical History  Diagnosis Date  . Arthritis   . Chicken pox   . Headache   . Chiari malformation   . GERD (gastroesophageal reflux disease)   . Allergic rhinitis   . Migraine   . UTI (lower urinary tract infection)   . Ehlers-Danlos disease     PAST SURGICAL HISTORY: Past Surgical History  Procedure Laterality Date  . Myomectomy    . Wisdom    . Dnc    . Cervical polypectomy      MEDICATIONS: Current Outpatient Prescriptions on File Prior to Visit  Medication Sig Dispense Refill  . aspirin-acetaminophen-caffeine (EXCEDRIN MIGRAINE) 250-250-65 MG tablet Take by mouth.    . butalbital-acetaminophen-caffeine (FIORICET, Gilliam)  50-325-40 MG tablet Take by mouth.    . Cholecalciferol (VITAMIN D3) 50000 UNITS CAPS Take 1 capsule by mouth once a week. 8 capsule 0  . clonazePAM (KLONOPIN) 1 MG tablet Take 1 tablet (1 mg total) by mouth 2 (two) times daily as needed for anxiety. 180 tablet 0  . cyclobenzaprine (FLEXERIL) 10 MG tablet  Take 10 mg by mouth.    Marland Kitchen ibuprofen (ADVIL,MOTRIN) 600 MG tablet Take 600 mg by mouth.    . Meclizine HCl 25 MG CHEW Chew 32 mg by mouth.     No current facility-administered medications on file prior to visit.    ALLERGIES: Allergies  Allergen Reactions  . Penicillins     When she was a child, was told don't take penicillin  . Doxycycline Rash    Agitation    FAMILY HISTORY: Family History  Problem Relation Age of Onset  . Arthritis      parents, gradnparents, siblings  . Lung cancer      grandparent  . Hypertension      parent    SOCIAL HISTORY: Social History   Social History  . Marital Status: Single    Spouse Name: N/A  . Number of Children: N/A  . Years of Education: N/A   Occupational History  . Not on file.   Social History Main Topics  . Smoking status: Current Every Day Smoker  . Smokeless tobacco: Not on file  . Alcohol Use: No  . Drug Use: Yes     Comment: Marijuana  . Sexual Activity: Not on file   Other Topics Concern  . Not on file   Social History Narrative   Pt lives with her mom. Lives on the second floor of an apt building, does have issues with the stairs. Pt has a bachelors degree    REVIEW OF SYSTEMS: Constitutional: No fevers, chills, or sweats, no generalized fatigue, change in appetite Eyes: No visual changes, double vision, eye pain Ear, nose and throat: No hearing loss, ear pain, nasal congestion, sore throat Cardiovascular: No chest pain, palpitations Respiratory:  No shortness of breath at rest or with exertion, wheezes GastrointestinaI: No nausea, vomiting, diarrhea, abdominal pain, fecal incontinence Genitourinary:  No dysuria, urinary retention or frequency Musculoskeletal:  No neck pain, back pain Integumentary: No rash, pruritus, skin lesions Neurological: as above Psychiatric: depression, anxiety Allergic/Immunologic: no itchy/runny eyes, nasal congestion, recent allergic reactions, rashes  PHYSICAL EXAM: Filed  Vitals:   12/27/14 0801  BP: 104/60  Pulse: 89   General: No acute distress.  Patient appears well-groomed.  Thin Head:  Normocephalic/atraumatic Eyes:  fundi unremarkable, without vessel changes, exudates, hemorrhages or papilledema. Neck: supple, no paraspinal tenderness, full range of motion Back: No paraspinal tenderness Heart: regular rate and rhythm Lungs: Clear to auscultation bilaterally. Vascular: No carotid bruits. Neurological Exam: Mental status: alert and oriented to person, place, and time, recent and remote memory intact, fund of knowledge intact, attention and concentration intact, speech fluent and not dysarthric, language intact. MMSE - Mini Mental State Exam 12/27/2014  Orientation to time 5  Orientation to Place 5  Registration 3  Attention/ Calculation 4  Recall 3  Language- name 2 objects 2  Language- repeat 1  Language- follow 3 step command 3  Language- read & follow direction 1  Write a sentence 1  Copy design 1  Total score 29   Cranial nerves: CN I: not tested CN II: pupils equal, round and reactive to light, visual fields intact, fundi  unremarkable, without vessel changes, exudates, hemorrhages or papilledema. CN III, IV, VI:  full range of motion, no nystagmus, no ptosis CN V: facial sensation intact CN VII: upper and lower face symmetric CN VIII: hearing intact CN IX, X: gag intact, uvula midline CN XI: sternocleidomastoid and trapezius muscles intact CN XII: tongue midline Bulk & Tone: normal, no fasciculations. Motor:  5/5 throughout (decreased effort with triceps and quads) Sensation:  Reduced pinprick sensation in feet and right hand.  Vibration sensation intact. Deep Tendon Reflexes:  2+ throughout, toes downgoing.  Finger to nose testing:  Without dysmetria.  Heel to shin:  Without dysmetria.   Gait:  Cautious and slow.  Able to turn and tandem walk. Romberg positive.  IMPRESSION: Chronic migraine without aura, complicated by  medication overuse Arnold Chiari malformation type I.   Memory deficits Tremor  She exhibits multiple somatic complaints.  Some of these symptoms, such as dysphagia, may be due to Chiari, however it appears quite mild without any evidence of syrinx or brainstem compression.  I don't appreciate any cognitive impairment today.  PLAN: Although there is no absolute contraindication for Botox with EDS, I would like to hold off on it for now and look up if there is any literature regarding it, especially since she already complains of swallowing difficulties.  I would have her consider trying topiramate or possibly Depakote.  I told her she cannot get pregnant while on these medications.  60 minutes spent with patient, over 50% spent discussing symptoms, etiology and management options.  Thank you for allowing me to take part in the care of this patient.  Metta Clines, DO  CC:  [name], MD

## 2014-12-27 NOTE — Patient Instructions (Addendum)
Migraine Recommendations: 1.  Will check to see if Botox is an option.  Otherwise, I would consider taking Depakote 2.  Be aware of common food triggers such as processed sweets, processed foods with nitrites (such as deli meat, hot dogs, sausages), foods with MSG, alcohol (such as wine), chocolate, certain cheeses, certain fruits (dried fruits, some citrus fruit), vinegar, diet soda. 3.  You must limit use of pain relievers to no more than 2 days out of the week.  You may taper down to no more than 6 days for one week, then 5 days for one week, then 4 days for one week, then 3 days for one week, then 2 days.  Continue excedrin but try to avoid Fioricet.  Will prescribe prednisone taper to help.  Take 6 tablets for day one, then 5 tablets for day two, then 4 tablets for day three, then 3 tablets for day four, then 2 tablets for day five, then 1 tablet for day six, then STOP 4.  Avoid caffeine 5.  Routine exercise 6.  Proper sleep hygiene 7.  Stay adequately hydrated with water 8.  Keep a headache diary. 9.  Maintain proper stress management. 10.  Do not skip meals. 11.  Consider supplements:  Magnesium oxide 400mg  to 600mg  daily, riboflavin 400mg , Coenzyme Q 10 100mg  three times daily 12.  Massage therapy of neck may be helpful 13.  Follow up in 3 months or sooner for Botox if that is an option.

## 2014-12-28 ENCOUNTER — Other Ambulatory Visit: Payer: Self-pay | Admitting: Neurology

## 2014-12-28 ENCOUNTER — Encounter: Payer: Self-pay | Admitting: Neurology

## 2014-12-28 ENCOUNTER — Telehealth: Payer: Self-pay | Admitting: Neurology

## 2014-12-28 MED ORDER — TOPIRAMATE 25 MG PO TABS: 25.0000 mg | ORAL_TABLET | Freq: Every day | ORAL | Status: DC

## 2014-12-28 NOTE — Telephone Encounter (Signed)
I spoke with Belinda Bryant.  At this time, we will hold off on Botox.  Instead, we will try topiramate 25mg  at bedtime.  Side effects discussed.  She has no plans of getting pregnant but I suggested taking folic acid 4mg  daily anyway.  She will call in 4 weeks with update and we can adjust dose if needed.

## 2014-12-28 NOTE — Telephone Encounter (Signed)
Left message on voicemail for patient to call back and discuss managment

## 2014-12-29 ENCOUNTER — Encounter: Payer: Self-pay | Admitting: Sports Medicine

## 2014-12-29 ENCOUNTER — Ambulatory Visit (INDEPENDENT_AMBULATORY_CARE_PROVIDER_SITE_OTHER): Payer: BLUE CROSS/BLUE SHIELD | Admitting: Sports Medicine

## 2014-12-29 VITALS — BP 118/83 | HR 78 | Ht 64.0 in | Wt 104.0 lb

## 2014-12-29 DIAGNOSIS — G935 Compression of brain: Secondary | ICD-10-CM | POA: Diagnosis not present

## 2014-12-29 DIAGNOSIS — Q796 Ehlers-Danlos syndrome, unspecified: Secondary | ICD-10-CM

## 2014-12-29 DIAGNOSIS — G43719 Chronic migraine without aura, intractable, without status migrainosus: Secondary | ICD-10-CM | POA: Diagnosis not present

## 2014-12-29 DIAGNOSIS — R269 Unspecified abnormalities of gait and mobility: Secondary | ICD-10-CM

## 2014-12-29 MED ORDER — TRAMADOL HCL 50 MG PO TABS: ORAL_TABLET | ORAL | Status: DC

## 2014-12-29 MED ORDER — CYCLOBENZAPRINE HCL 5 MG PO TABS: 5.0000 mg | ORAL_TABLET | Freq: Every day | ORAL | Status: DC

## 2014-12-29 NOTE — Assessment & Plan Note (Signed)
Start with some HEP  Address shoe inserts in future

## 2014-12-29 NOTE — Assessment & Plan Note (Signed)
Trigger is likely both the ED and Chiari 1 malformation so that the neck position and spasm worsens or causes onset of headache  We will try pm flexeril and bid tramadol  Neck collar

## 2014-12-29 NOTE — Assessment & Plan Note (Signed)
See detailed plan on OV

## 2014-12-29 NOTE — Assessment & Plan Note (Signed)
Focus rehab on isometric   Try to stabilize the upper neck

## 2014-12-29 NOTE — Progress Notes (Signed)
Subjective:    Patient ID: Belinda Bryant, female    DOB: [birth year], 34 y.o.   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   She has been seen by 3 separate neurosurgeons and found not to be a candidate for correction of her Arnold-Chiari malformation. She was recently diagnosed at [facility] in genetics department as a type III patient based on family history, personal history and hypermobility score. Dr. Josephina Gip her PCPt sent her here for further evaluation and treatment.  PMHx -- reviewed and updated in EMR; relevant portions impacting decision making: Patient with Arnold-Chiari malformation, 62mm depression that related by multiple neurosurgeons and found not to be a surgical candidate based on her underlying connective tissue disorder. Subtle headache disorder. 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 Other medical problems consistent with hypermobility including uterine prolapse (despite being nulliparous), anxiety disorder and autonomic dysfunction. 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  Her partner helps her with normal activities.  Meds -- reviewed and updated in EMR; relevant portions impacting decision making: Current medications were reviewed and the side effects of prior medications, mainly TCAs and other anti-depressants were reviewed in detail. She has had difficulty tolerating all of these and so now primarily uses Klonopin prn for anxiety and NSAIDS for pain.  Does take Ca and Vit  D.  Family hx -- eviewed and updated in EMR; mother with history of several relatively atraumatic dislocations of the hip and shoulder. Maternal grandfather died in his 61s of an aortic aneurysm. Maternal grandmother and several maternal aunts with significant uterine and pelvic prolapse, starting at a very young age.  Allergies, Social Hx -- reviewed and updated in EMR   Review of Systems 10-point ROS was positive for several symptoms of autonomic dysfunction including:  Neuro: -- Several POTS like episodes -- Frequent occipital origin, with radiation to frontal scalp -- Episodes of autonomic dysfunction manifested by irritiable bowel, tachycardia, dizziness/LH with position changes and episodes of Raynaud-like phenomenon  GI, Cardiology: -- See above  Derm: -- Episodes described above, also brief episodes of dysgraphia Has easy bruising and capillary rupture Raynaud's type changes      Objective:   Physical Exam  General: Resting comfortably on exam table and in no acute distress/ looks tired/ ? Depressed BP 118/83 mmHg  Pulse 78  Ht 5\' 4"  (1.626 m)  Wt 104 lb (47.174 kg)  BMI 17.84 kg/m2 Neurologic: Distal sensory and motor exam in bilateral lower extremities is unremarkable  Cardiopulmonary: 2+ pulses in bilateral distal lower extremity; nonlabored respiration  Psychiatric exam: Normal mood, blunted affect. Appropriate thought content   Musculoskeletal exam: Head carriage is anterior with flexed posturing secondary to chronic instability/postural weakness  The patient exhibits flexion to 40 extension to 10 rotation to 60 bilaterally and side bending to 30 bilaterally in the cervical spine There is a positive Spurling's, causing pain down the center of the cervical spine without radiation to either upper extremity Pain and symptoms improved with traction  Shoulders are IR with scap protraction Slt thoracic kyphosis at rest  The patient has symmetric leg length  in  both a supine and seated position  Beighton score 5/9 -- unable to place palms on ground -- LEFT thumb is unable to reach radius (previous laceration to the area repaired surgically as a child) -- The knees do NOT hyperextend  Foot exam: -- Cavus, rigid arch bilaterally -- Transverse arch collapse bilaterally -- Pronation on standing, more pronounced with ambulation -- Achilles transverse diameter enlargement bilaterally, significant valgus deformity =  Gait assessment: With ambulation the patient has an unsteady pelvis. She has a positive Trendelenburg on the LEFT. Her hindfoot valgus and forefoot pronation become more pronounced with walking. She spends an equal amount of time in stance and swing phase. Her gait exhibits a appropriate/normal base. She is unable to ambulate on her toes or heels across the room, secondary to dizziness when losing contact with both her forefoot and hindfoot.  HIP exam: Hip exam performed after watching gait analysis above The patient has 4/5 strength bilaterally in hip abduction, but weakness is more pronounced and pain present with resisted abduction of the LEFT  Flexion to 140, extension to 30, arc of rotation ranging to 160 (40 internal rotation, 120 external rotation) 5/5 strength in hip extension, flexion and internal rotation/external rotation  The patient had a negative Faber bilaterally The patient had a negative logroll and axial load/circumduction of the bilateral femurs     Assessment & Plan:   #1. Chronic Pain Syndrome, Secondary to Ehlers-Danlos Syndrome Patient presenting on referral from primary care physician for Erhler-Danlos syndrome as primary etiology for chronic pain syndrome. Her treatment is complicated by an Arnold-Chiari malformation that neurosurgery at multiple hospitals are refusing to treat given her underlying connective tissue disorder. Patient is a longer working secondary to chronic pain and fatigue from her  hypermobility syndrome and autonomic dysfunction. She is previously tried several different antidepressants with either paradoxical side effects or no/limited relief. She would like to avoid narcotics at all costs. She has not tried any muscle relaxers or tramadol.  #2 Chronic headache Probably migraine variant and triggered by neck instability.  She says pain usually 8 to 9/10  Takes migraine medicines at 10/10 periodically - - 1 to 2x week  We spent 45 mins face to face in evaluating heaches, gait, neck and other joint issues.  Extensive amount of time was spent (greater than 50% of this appointment) reviewing the pathology, pathophysiology and rehabilitative practices necessary to help the patient understand her disease process and improve her quality of life through rehabilitation.  Plan: -- See patient instructions for detailed program for postural and stability improvement -- Tramadol 50mg  given TWICE Daily, up to 3 times daily for chronic pain -- Daily Flexeril 5mg  qHS for baseline spasm control -- Follow-up in 12 weeks to assess response to this program, after undersigned returned from surgery  ===

## 2014-12-29 NOTE — Patient Instructions (Addendum)
The goal of our management for your Ehlers-Danlos syndrome is to improve posture, strength and stability to decrease your excess motion, decrease your pain and improve your quality of life.   Cervical Spine Program: 1. Please wear a soft-plastic Cervical collar at home, at least 4 hours/day to off-load the C-spine  2. Isometric (resistance without moving) of the Cervical Spine --- Flexion (pushing forehead against the hand) --- Extension (pushing the back of the head against a hand or wall/talbe) --- Side-bending (place your right ear toward your right shoulder, using your hand for resistance...vice-versa)  COMPLETE THESE EXERCISES 5-6 days/week, twice daily for 10 seconds each rep, 5 reps for 3 sets (15 total in each direction)  3. Postural Exercise / Improvement: --- Laying flat on your back, move into the T-position and H-position and hold for 30 seconds in each position, 3 sets on 5-6 days/week --- T-position --->>> On your back, move your arms to your side and roll your shoulders back into a neutral posture --- H-position --->>> On your back, move your arms above your head and roll your shoulders back into a neutral posture  While sitting upright, do postural stabilizing exercise (reverse curve exercise) --- Move into upright row position, by moving shoulder blades together and holding at the back for 5 seconds each, 5 reps   4. Hip stabilization exercises: --- Lay on your right side and lift your left leg up in a side position, start with 3 sets of 10 without weight --- Move up to 3 sets of 15, 3 sets of 20 and ultimately 3 sets of 30 seconds --- REPEAT ON THE LEFT SIDE --- Perform these 5-6x/week    5. SELF REDUCTION TECHNIQUE FOR NECK -- Place bridges of thumbs under the mandible -- Left upright to alleviate pressure from the neck -- Hold for 10 seconds, 3 sets.........Marland Kitchenperform twice daily on every day or at least 5 days/week  =

## 2015-01-17 ENCOUNTER — Ambulatory Visit (INDEPENDENT_AMBULATORY_CARE_PROVIDER_SITE_OTHER): Payer: BLUE CROSS/BLUE SHIELD | Admitting: Family Medicine

## 2015-01-17 ENCOUNTER — Encounter (INDEPENDENT_AMBULATORY_CARE_PROVIDER_SITE_OTHER): Payer: Self-pay

## 2015-01-17 ENCOUNTER — Encounter: Payer: Self-pay | Admitting: Family Medicine

## 2015-01-17 VITALS — BP 98/64 | HR 81 | Temp 98.3°F | Ht 64.0 in | Wt 100.8 lb

## 2015-01-17 DIAGNOSIS — F329 Major depressive disorder, single episode, unspecified: Secondary | ICD-10-CM | POA: Diagnosis not present

## 2015-01-17 DIAGNOSIS — IMO0001 Reserved for inherently not codable concepts without codable children: Secondary | ICD-10-CM | POA: Insufficient documentation

## 2015-01-17 DIAGNOSIS — F32A Depression, unspecified: Secondary | ICD-10-CM | POA: Insufficient documentation

## 2015-01-17 DIAGNOSIS — G43719 Chronic migraine without aura, intractable, without status migrainosus: Secondary | ICD-10-CM | POA: Diagnosis not present

## 2015-01-17 DIAGNOSIS — R61 Generalized hyperhidrosis: Secondary | ICD-10-CM | POA: Diagnosis not present

## 2015-01-17 NOTE — Patient Instructions (Signed)
Nice to see you. Please call Dr. Jasmine December to set up an appointment for therapy. Please keep your follow-up with [facility]. If you develop intent or plan to harm herself or worsening depression please seek medical attention immediately. If you develop worsening headaches, or develop numbness, tingling, weakness, vision changes, or any new or change in symptoms please seek medical attention.

## 2015-01-17 NOTE — Progress Notes (Signed)
Pre visit review using our clinic review tool, if applicable. No additional management support is needed unless otherwise documented below in the visit note. 

## 2015-01-17 NOTE — Assessment & Plan Note (Signed)
Discussed that night sweats and hot flashes are likely related to her autonomic dysfunction. She reports having had an extensive workup previously. We will continue to monitor this at this time. Given return precautions.

## 2015-01-17 NOTE — Progress Notes (Signed)
Patient ID: Belinda Bryant, female   DOB: [birth year], 34 y.o.   MRN: [id number]  [name], MD Phone: [phone]  Belinda Bryant is a 34 y.o. female who presents today for follow-up.  Depression: Patient notes increasing depression and feelings of hopelessness. She notes all of her chronic medical issues have started to wear on her increasingly. She notes some day she feels like it would be better if the Lord took her to heaven. She notes these are not suicidal thoughts. She adamantly denies that these are suicidal thoughts. She has no intent or plan to harm herself. She just feels like there are some days that it would be better for her not to be here anymore. She notes issues with sleeping and feeling tired. Her appetite is poor. She feels bad about herself. She has trouble concentrating. She notes she has tried multiple SSRIs and other antidepressants in the past and always had a bad reaction to them. She is now interested in seeing a therapist.  Patient continues to have headaches. She describes these as an all over aching sensation in her head. She saw neurology and they started her on Topamax. It was felt they were migraine in nature. She notes they also advised her to stop the Fioricet. She states she is still taking the Fioricet and did not start the Topamax. She also saw sports medicine who gave her exercises for her neck and prescribed Flexeril and tramadol. She notes the Flexeril helps some with her headaches. Tramadol she has taken a few times and has not helped at all. She has been wearing a neck brace at night and sometimes during the day. She's not had any weakness or vision changes. She does note what she describes as sensory changes in her fingers and toes, though states this is not numbness or tingling. She additionally continues to have hot flashes and night sweats. She notes that she has been evaluated for HIV, thyroid issues, TB, other infectious causes, and had  multiple lab workups for this. Nobody has ever found a cause for this.  PMH: Smoker   ROS see history of present illness  Objective  Physical Exam Filed Vitals:   01/17/15 1032  BP: 98/64  Pulse: 81  Temp: 98.3 F (36.8 C)    BP Readings from Last 3 Encounters:  01/17/15 98/64  12/29/14 118/83  12/27/14 104/60   Wt Readings from Last 3 Encounters:  01/17/15 100 lb 12.8 oz (45.723 kg)  12/29/14 104 lb (47.174 kg)  12/27/14 104 lb 4.8 oz (47.31 kg)    Physical Exam  Constitutional:  No acute distress, not diaphoretic  HENT:  Head: Normocephalic and atraumatic.  Right Ear: External ear normal.  Left Ear: External ear normal.  Cardiovascular: Normal rate, regular rhythm and normal heart sounds.  Exam reveals no gallop.   No murmur heard. Pulmonary/Chest: Effort normal and breath sounds normal. No respiratory distress. She has no wheezes. She has no rales.  Neurological: She is alert.  CN 2-12 intact, 5/5 strength in bilateral biceps, triceps, grip, quads, hamstrings, plantar and dorsiflexion, sensation to light touch intact in bilateral UE and LE, normal gait, 2+ patellar reflexes  Skin: Skin is warm and dry.  Psychiatric:  Mood depressed, affect depressed     Assessment/Plan: Please see individual problem list.  Depression Patient reports increasing depression. PHQ 9 score of 20. She does have thoughts of being better off going to heaven, though has no intent or plan to harm herself. She  is getting married in the next 2 weeks and is very much looking forward to that. She would like help, though does not want medication for this. She would like to see a therapist. She was given the name of a local therapist to call and set up an appointment. She is given return precautions.  Intractable chronic migraine without aura and without status migrainosus Continues to have issues with headaches. 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 She has seen neurology  though did not follow through with the recommendations of the neurologist and would prefer to see a different neurologist. She still has an appointment at [facility] in February and she will keep this appointment. I discussed at length repeating an MRI given persistent and worsening headaches and possible extremity sensory changes, though the patient declined this at this time. I also discussed further pain management of her headaches and other scattered pains and discussed narcotic and nonnarcotic pain regimens, though the patient declined this at this time. At this point she will keep her follow-up with [facility] in February. She is given return precautions.  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 She reports having had an extensive workup previously. We will continue to monitor this at this time. Given return precautions.     [name]

## 2015-01-17 NOTE — Assessment & Plan Note (Signed)
Patient reports increasing depression. PHQ 9 score of 20. She does have thoughts of being better off going to heaven, though has no intent or plan to harm herself. She is getting married in the next 2 weeks and is very much looking forward to that. She would like help, though does not want medication for this. She would like to see a therapist. She was given the name of a local therapist to call and set up an appointment. She is given return precautions.

## 2015-01-17 NOTE — Assessment & Plan Note (Signed)
Continues to have issues with headaches. 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 She has seen neurology though did not follow through with the recommendations of the neurologist and would prefer to see a different neurologist. She still has an appointment at [facility] in February and she will keep this appointment. I discussed at length repeating an MRI given persistent and worsening headaches and possible extremity sensory changes, though the patient declined this at this time. I also discussed further pain management of her headaches and other scattered pains and discussed narcotic and nonnarcotic pain regimens, though the patient declined this at this time. At this point she will keep her follow-up with [facility] in February. She is given return precautions.

## 2015-01-31 ENCOUNTER — Encounter: Payer: Self-pay | Admitting: Family Medicine

## 2015-01-31 ENCOUNTER — Other Ambulatory Visit (HOSPITAL_COMMUNITY)
Admission: RE | Admit: 2015-01-31 | Discharge: 2015-01-31 | Disposition: A | Discharge status: Home / Self Care | Payer: BLUE CROSS/BLUE SHIELD | Source: Ambulatory Visit | Attending: Family Medicine | Admitting: Family Medicine

## 2015-01-31 ENCOUNTER — Ambulatory Visit (INDEPENDENT_AMBULATORY_CARE_PROVIDER_SITE_OTHER): Payer: BLUE CROSS/BLUE SHIELD | Admitting: Family Medicine

## 2015-01-31 VITALS — BP 96/62 | HR 98 | Temp 99.0°F | Ht 64.0 in | Wt 99.6 lb

## 2015-01-31 DIAGNOSIS — R319 Hematuria, unspecified: Secondary | ICD-10-CM

## 2015-01-31 DIAGNOSIS — N898 Other specified noninflammatory disorders of vagina: Secondary | ICD-10-CM

## 2015-01-31 DIAGNOSIS — Z01419 Encounter for gynecological examination (general) (routine) without abnormal findings: Secondary | ICD-10-CM | POA: Insufficient documentation

## 2015-01-31 DIAGNOSIS — N760 Acute vaginitis: Secondary | ICD-10-CM | POA: Insufficient documentation

## 2015-01-31 DIAGNOSIS — Z1151 Encounter for screening for human papillomavirus (HPV): Secondary | ICD-10-CM | POA: Insufficient documentation

## 2015-01-31 DIAGNOSIS — Z113 Encounter for screening for infections with a predominantly sexual mode of transmission: Secondary | ICD-10-CM | POA: Insufficient documentation

## 2015-01-31 DIAGNOSIS — R109 Unspecified abdominal pain: Secondary | ICD-10-CM

## 2015-01-31 LAB — CBC
HEMATOCRIT: 38 % (ref 36.0–46.0)
Hemoglobin: 12.7 g/dL (ref 12.0–15.0)
MCHC: 33.5 g/dL (ref 30.0–36.0)
MCV: 91.2 fl (ref 78.0–100.0)
PLATELETS: 240 10*3/uL (ref 150.0–400.0)
RBC: 4.17 Mil/uL (ref 3.87–5.11)
RDW: 14.7 % (ref 11.5–15.5)
WBC: 6 10*3/uL (ref 4.0–10.5)

## 2015-01-31 LAB — URINALYSIS, MICROSCOPIC ONLY

## 2015-01-31 LAB — POCT URINALYSIS DIPSTICK
Bilirubin, UA: NEGATIVE
Glucose, UA: NEGATIVE
Ketones, UA: NEGATIVE
NITRITE UA: NEGATIVE
PH UA: 6
PROTEIN UA: NEGATIVE
Spec Grav, UA: 1.015
UROBILINOGEN UA: 0.2

## 2015-01-31 LAB — POCT URINE PREGNANCY: PREG TEST UR: NEGATIVE

## 2015-01-31 MED ORDER — NITROFURANTOIN MONOHYD MACRO 100 MG PO CAPS: 100.0000 mg | ORAL_CAPSULE | Freq: Two times a day (BID) | ORAL | Status: DC

## 2015-01-31 NOTE — Progress Notes (Signed)
Patient ID: Belinda Bryant, female   DOB: 1980-11-28, 34 y.o.   MRN: [id number]  [name], MD Phone: [phone]  Belinda Bryant is a 34 y.o. female who presents today for  Same-day visit.  Hematuria: Patient notes last Wednesday her bilateral flanks started to hurt. She notes on Thursday she began to urinate blood. She had this Thursday, Friday, and Saturday. She noted this was with her urination and not from her vagina. She had no blood on her pad between urinating episodes. She then noted Sunday she felt like something moved down from her left flank down to her left lower quadrant. She notes she went to sleep and the pain was gone. She also notes some urinary frequency and urgency. She notes a vaginal burning discomfort. She does note some extra discharge. She has additionally had some constipation as well. She notes she has chronic abdominal pain that is at its baseline. She has no history of stone. She denies numbness and weakness. She has no saddle anesthesia, bowel or bladder incontinence, fever, or history of cancer.  PMH:  Current smoker.   ROS see history of present illness  Objective  Physical Exam Filed Vitals:   01/31/15 0940  BP: 96/62  Pulse: 98  Temp: 99 F (37.2 C)    Physical Exam  Constitutional: She is well-developed, well-nourished, and in no distress.  HENT:  Head: Normocephalic and atraumatic.  Right Ear: External ear normal.  Left Ear: External ear normal.  Mouth/Throat: Oropharynx is clear and moist. No oropharyngeal exudate.  Eyes: Conjunctivae are normal. Pupils are equal, round, and reactive to light.  Neck: Neck supple.  Cardiovascular: Normal rate, regular rhythm and normal heart sounds.  Exam reveals no gallop and no friction rub.   No murmur heard. Pulmonary/Chest: Effort normal and breath sounds normal. No respiratory distress. She has no wheezes. She has no rales.  Abdominal: Soft. Bowel sounds are normal. She exhibits no  distension and no mass. There is tenderness ( mild suprapubic discomfort, no right lower quadrant or left lower quadrant discomfort). There is no rebound and no guarding.  No CVA tenderness  Genitourinary:  Normal labia, normal vaginal mucosa, no vaginal discharge, cervix with irritation of the os, there is no discharge from the os, patient had discomfort on insertion of the speculum, patient also had discomfort on insertion of finger for bimanual exam, patient did have discomfort with contact with cervix on bimanual exam that she notes is at baseline and has been occurring for the last 8 or so years, no adnexal masses or tenderness or suprapubic masses palpated  Lymphadenopathy:    She has no cervical adenopathy.  Neurological: She is alert. Gait normal.  5 out of 5 strength in bilateral quads, hamstrings, plantar flexion, dorsiflexion, sensation to light touch intact in bilateral lower extremities, 2+ patellar reflexes  Skin: Skin is warm and dry. She is not diaphoretic.     Assessment/Plan: Please see individual problem list.  Hematuria Patient notes several day history of hematuria and bilateral flank pain followed by radiation of pain down left flank to left lower quadrant. This pain is improved, the patient does still have some suprapubic discomfort. She has urgency and frequency which could be consistent with UTI. History of gross hematuria with flank pain could be consistent with a kidney stone. She additionally has some vaginal discharge and vaginal irritation as well. She has no peritoneal signs on exam. She had a negative pregnancy test in the office. She appears to  be at her baseline. I'll signs are stable. Suspect somewhat that her chronic abdominal pain is playing a part in this in addition to an acute cause. I discussed at length workup for this issue. Discussed that obtaining CT scan abdomen and pelvis to evaluate for stone would be the methodology for proving she had a stone. She  could've also passed it at this point and if that was the case a CT scan will not reveal stone. Advised that we needed to treat her for UTI given her urgency and frequency and UA findings. Also advised that we would send off Pap smear given cervical irritation, wet prep, and gonorrhea and chlamydia testing. We'll send her urine for culture and micro-. At this point the patient opted to not do the CT scan. She opted to see what the urine culture and microscopy and cervical samples showed prior to proceeding with imaging. We will start her on nitrofurantoin for UTI. We'll also obtain a CBC to evaluate for infectious process and to evaluate her hemoglobin. She will continue to monitor. She is given return precautions.     Orders Placed This Encounter  Procedures  . Urine Culture  . Urine Microscopic Only  . CBC  . POCT Urinalysis Dipstick  . POCT urine pregnancy    Meds ordered this encounter  Medications  . nitrofurantoin, macrocrystal-monohydrate, (MACROBID) 100 MG capsule    Sig: Take 1 capsule (100 mg total) by mouth 2 (two) times daily.    Dispense:  14 capsule    Refill:  0    Dragon voice recognition software was used during the dictation process of this note. If any phrases or words seem inappropriate it is likely secondary to the translation process being inefficient.  [name]

## 2015-01-31 NOTE — Patient Instructions (Signed)
Nice to see you. Your symptoms are likely related to urinary tract infection, though could be related to kidney stone. Given that the discomfort you had has improved this is less likely to be related to kidney stone. We'll send your urine for culture and have it looked under microscope. We will also send your vaginal studies as well.  We will treat you with Macrobid for a urinary tract infection. If you develop abdominal pain, nausea, vomiting, fever, blood in your urine, blood in her stool, or any new or change in symptoms please seek medical attention.

## 2015-01-31 NOTE — Progress Notes (Signed)
Pre visit review using our clinic review tool, if applicable. No additional management support is needed unless otherwise documented below in the visit note. 

## 2015-01-31 NOTE — Assessment & Plan Note (Addendum)
Patient notes several day history of hematuria and bilateral flank pain followed by radiation of pain down left flank to left lower quadrant. This pain is improved, the patient does still have some suprapubic discomfort. She has urgency and frequency which could be consistent with UTI. History of gross hematuria with flank pain could be consistent with a kidney stone. She additionally has some vaginal discharge and vaginal irritation as well. She has no peritoneal signs on exam. She had a negative pregnancy test in the office. She appears to be at her baseline. I'll signs are stable. Suspect somewhat that her chronic abdominal pain is playing a part in this in addition to an acute cause. I discussed at length workup for this issue. Discussed that obtaining CT scan abdomen and pelvis to evaluate for stone would be the methodology for proving she had a stone. She could've also passed it at this point and if that was the case a CT scan will not reveal stone. Advised that we needed to treat her for UTI given her urgency and frequency and UA findings. Also advised that we would send off Pap smear given cervical irritation, wet prep, and gonorrhea and chlamydia testing. We'll send her urine for culture and micro-. At this point the patient opted to not do the CT scan. She opted to see what the urine culture and microscopy and cervical samples showed prior to proceeding with imaging. We will start her on nitrofurantoin for UTI. We'll also obtain a CBC to evaluate for infectious process and to evaluate her hemoglobin. She will continue to monitor. She is given return precautions.

## 2015-02-01 ENCOUNTER — Telehealth: Payer: Self-pay | Admitting: *Deleted

## 2015-02-01 LAB — URINE CULTURE
COLONY COUNT: NO GROWTH
Organism ID, Bacteria: NO GROWTH

## 2015-02-01 NOTE — Telephone Encounter (Signed)
Noted  

## 2015-02-01 NOTE — Telephone Encounter (Signed)
Patient should be evaluated again. I can see her in the office or she can be evaluated at an urgent care or the ED. If she has had intractable vomiting and abdominal pain it may be beneficial to be evaluated somewhere that could give her IV fluids and obtain imaging. If her symptoms are intermittent she can be evaluated in the office.

## 2015-02-01 NOTE — Telephone Encounter (Signed)
Please advise 

## 2015-02-01 NOTE — Telephone Encounter (Signed)
Spoke with patient and she stated that after she left here yesterday that the pain got worse and started to radiate down the groin area. She started vomiting last night for several hours, but it seems to have subsided some. She is not drinking much fluids so advised her that it would be best to be evaluated at the ER because she will probably need fluids and possible scan. Patient said that she will go to [facility] ER.

## 2015-02-01 NOTE — Telephone Encounter (Signed)
Patient stated that she was seen, yesterday. Patient has  pain on her left side that  has not subsided. She reported that she had vomiting through out the night, with pain that radiated down to her vaginal area. She started her antibiotic on yesterday, and has had two doses. Patient requested a call back, from Dr. Caryl Bis in regards to her coming back to see him, or going to the emergency room. Please Advise.

## 2015-02-02 LAB — CYTOLOGY - PAP

## 2015-02-03 LAB — CERVICOVAGINAL ANCILLARY ONLY
Bacterial vaginitis: NEGATIVE
Candida vaginitis: NEGATIVE

## 2015-02-06 ENCOUNTER — Encounter: Payer: Self-pay | Admitting: Emergency Medicine

## 2015-02-06 ENCOUNTER — Emergency Department: Payer: BLUE CROSS/BLUE SHIELD

## 2015-02-06 ENCOUNTER — Emergency Department
Admission: EM | Admit: 2015-02-06 | Discharge: 2015-02-06 | Disposition: A | Discharge status: Home / Self Care | Payer: BLUE CROSS/BLUE SHIELD | Attending: Emergency Medicine | Admitting: Emergency Medicine

## 2015-02-06 DIAGNOSIS — Z7982 Long term (current) use of aspirin: Secondary | ICD-10-CM | POA: Insufficient documentation

## 2015-02-06 DIAGNOSIS — Z79899 Other long term (current) drug therapy: Secondary | ICD-10-CM | POA: Diagnosis not present

## 2015-02-06 DIAGNOSIS — N83201 Unspecified ovarian cyst, right side: Secondary | ICD-10-CM | POA: Diagnosis not present

## 2015-02-06 DIAGNOSIS — N200 Calculus of kidney: Secondary | ICD-10-CM | POA: Diagnosis not present

## 2015-02-06 DIAGNOSIS — Z880 Allergy status to penicillin: Secondary | ICD-10-CM | POA: Insufficient documentation

## 2015-02-06 DIAGNOSIS — F17210 Nicotine dependence, cigarettes, uncomplicated: Secondary | ICD-10-CM | POA: Diagnosis not present

## 2015-02-06 DIAGNOSIS — Z3202 Encounter for pregnancy test, result negative: Secondary | ICD-10-CM | POA: Insufficient documentation

## 2015-02-06 DIAGNOSIS — Z792 Long term (current) use of antibiotics: Secondary | ICD-10-CM | POA: Diagnosis not present

## 2015-02-06 DIAGNOSIS — Z7952 Long term (current) use of systemic steroids: Secondary | ICD-10-CM | POA: Diagnosis not present

## 2015-02-06 DIAGNOSIS — R1032 Left lower quadrant pain: Secondary | ICD-10-CM | POA: Diagnosis present

## 2015-02-06 LAB — COMPREHENSIVE METABOLIC PANEL
ALBUMIN: 4.5 g/dL (ref 3.5–5.0)
ALK PHOS: 48 U/L (ref 38–126)
ALT: 13 U/L — ABNORMAL LOW (ref 14–54)
ANION GAP: 6 (ref 5–15)
AST: 14 U/L — ABNORMAL LOW (ref 15–41)
BILIRUBIN TOTAL: 0.5 mg/dL (ref 0.3–1.2)
BUN: 12 mg/dL (ref 6–20)
CALCIUM: 9.2 mg/dL (ref 8.9–10.3)
CO2: 29 mmol/L (ref 22–32)
Chloride: 107 mmol/L (ref 101–111)
Creatinine, Ser: 0.78 mg/dL (ref 0.44–1.00)
GFR calc Af Amer: >60 mL/min (ref >60)
GLUCOSE: 90 mg/dL (ref 65–99)
POTASSIUM: 3.6 mmol/L (ref 3.5–5.1)
Sodium: 142 mmol/L (ref 135–145)
TOTAL PROTEIN: 7.3 g/dL (ref 6.5–8.1)

## 2015-02-06 LAB — CBC
HCT: 41.1 % (ref 35.0–47.0)
HEMOGLOBIN: 13.9 g/dL (ref 12.0–16.0)
MCH: 31.1 pg (ref 26.0–34.0)
MCHC: 33.9 g/dL (ref 32.0–36.0)
MCV: 91.8 fL (ref 80.0–100.0)
Platelets: 269 10*3/uL (ref 150–440)
RBC: 4.48 MIL/uL (ref 3.80–5.20)
RDW: 14.6 % — AB (ref 11.5–14.5)
WBC: 7.5 10*3/uL (ref 3.6–11.0)

## 2015-02-06 LAB — URINALYSIS COMPLETE WITH MICROSCOPIC (ARMC ONLY)
Bilirubin Urine: NEGATIVE
GLUCOSE, UA: NEGATIVE mg/dL
KETONES UR: NEGATIVE mg/dL
LEUKOCYTES UA: NEGATIVE
NITRITE: NEGATIVE
Protein, ur: NEGATIVE mg/dL
SPECIFIC GRAVITY, URINE: 1.005 (ref 1.005–1.030)
pH: 6 (ref 5.0–8.0)

## 2015-02-06 LAB — LIPASE, BLOOD: Lipase: 18 U/L (ref 11–51)

## 2015-02-06 LAB — POCT PREGNANCY, URINE: PREG TEST UR: NEGATIVE

## 2015-02-06 MED ORDER — ONDANSETRON 4 MG PO TBDP
4.0000 mg | ORAL_TABLET | Freq: Once | ORAL | Status: AC
  Administered 2015-02-06: 4 mg via ORAL

## 2015-02-06 MED ORDER — SODIUM CHLORIDE 0.9 % IV BOLUS (SEPSIS)
1000.0000 mL | Freq: Once | INTRAVENOUS | Status: AC
  Administered 2015-02-06: 1000 mL via INTRAVENOUS

## 2015-02-06 MED ORDER — IOHEXOL 240 MG/ML SOLN
25.0000 mL | INTRAMUSCULAR | Status: AC
  Administered 2015-02-06: 50 mL via ORAL
  Filled 2015-02-06 (×2): qty 25

## 2015-02-06 MED ORDER — ONDANSETRON 4 MG PO TBDP: 4.0000 mg | ORAL_TABLET | Freq: Three times a day (TID) | ORAL | Status: DC | PRN

## 2015-02-06 MED ORDER — OXYCODONE-ACETAMINOPHEN 5-325 MG PO TABS
1.0000 | ORAL_TABLET | Freq: Once | ORAL | Status: AC
  Administered 2015-02-06: 1 via ORAL

## 2015-02-06 MED ORDER — OXYCODONE-ACETAMINOPHEN 5-325 MG PO TABS: 1.0000 | ORAL_TABLET | ORAL | Status: DC | PRN

## 2015-02-06 MED ORDER — ONDANSETRON HCL 4 MG/2ML IJ SOLN
INTRAMUSCULAR | Status: AC
  Administered 2015-02-06: 4 mg via INTRAVENOUS
  Filled 2015-02-06: qty 2

## 2015-02-06 MED ORDER — ONDANSETRON 4 MG PO TBDP
ORAL_TABLET | ORAL | Status: AC
  Administered 2015-02-06: 4 mg via ORAL
  Filled 2015-02-06: qty 1

## 2015-02-06 MED ORDER — OXYCODONE-ACETAMINOPHEN 5-325 MG PO TABS
ORAL_TABLET | ORAL | Status: AC
  Administered 2015-02-06: 1 via ORAL
  Filled 2015-02-06: qty 1

## 2015-02-06 MED ORDER — ONDANSETRON HCL 4 MG/2ML IJ SOLN
4.0000 mg | Freq: Once | INTRAMUSCULAR | Status: AC
  Administered 2015-02-06: 4 mg via INTRAVENOUS

## 2015-02-06 MED ORDER — IOHEXOL 300 MG/ML  SOLN
75.0000 mL | Freq: Once | INTRAMUSCULAR | Status: AC | PRN
  Administered 2015-02-06: 75 mL via INTRAVENOUS
  Filled 2015-02-06: qty 75

## 2015-02-06 NOTE — ED Notes (Signed)
Pt having left sided flank pain and LLQ pain, states she was peeing blood 2 weeks ago and seen by PCP, is having constipation and eating issues, states when she eats, her stomach hurts really bad. Pt very thin, emotional in triage, states she has been through a lot with her stomach. Pt states she does smoke pot, her Dr's are aware, states she smokes so she can eat.

## 2015-02-06 NOTE — ED Notes (Signed)
Pt states near syncope when eating, states right sided abd pain radiating down to her groin and to left side of abd, states last week she peed blood for 3 days, states inability to eat and drink, some days she only eats applesauce, states nausea

## 2015-02-06 NOTE — ED Provider Notes (Signed)
[facility] Emergency Department Provider Note  ____________________________________________  Time seen: 1:45 PM  I have reviewed the triage vital signs and the nursing notes.   HISTORY  Chief Complaint Flank Pain and Abdominal Pain      HPI Belinda Bryant is a 34 y.o. female presents with left upper quadrant left lower quadrant left flank pain that is currently 7 out of 102 weeks. Patient states she also has had constipation and difficulty eating. She states that she has lost considerable amount of weight going from 03/04/1996 pounds in the past few months secondary to very poor appetite. Patient states that she was recently diagnosed with Chiari malformation as well as [name]    Past Medical History  Diagnosis Date  . Arthritis   . Chicken pox   . Headache   . Chiari malformation   . GERD (gastroesophageal reflux disease)   . Allergic rhinitis   . Migraine   . UTI (lower urinary tract infection)   . Ehlers-Danlos disease     Patient Active Problem List   Diagnosis Date Noted  . Hematuria 01/31/2015  . Depression 01/17/2015  . Sweating 01/17/2015  . Abnormality of gait 12/29/2014  . Intractable chronic migraine without aura and without status migrainosus 12/27/2014  . 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 Allergic rhinitis 12/07/2014  . Cough 12/07/2014  . Vitamin D deficiency 12/07/2014  . Anxiety 12/07/2014  . Headache 11/24/2014  . Chronic abdominal pain 11/24/2014  . Ehlers-Danlos disease 11/24/2014  . Palpitations 11/24/2014  . Muscle twitching 11/23/2014    Past Surgical History  Procedure Laterality Date  . Myomectomy    . Wisdom    . Dnc    . Cervical polypectomy      Current Outpatient Rx  Name  Route  Sig  Dispense  Refill  . aspirin-acetaminophen-caffeine (EXCEDRIN MIGRAINE) 250-250-65 MG tablet   Oral   Take by mouth.         . butalbital-acetaminophen-caffeine (FIORICET, ESGIC)  50-325-40 MG tablet   Oral   Take by mouth.         . butalbital-aspirin-caffeine (FIORINAL) 50-325-40 MG capsule   Oral   Take by mouth. Reported on 01/31/2015         . Cholecalciferol (VITAMIN D3) 50000 UNITS CAPS   Oral   Take 1 capsule by mouth once a week.   8 capsule   0   . clonazePAM (KLONOPIN) 1 MG tablet   Oral   Take 1 tablet (1 mg total) by mouth 2 (two) times daily as needed for anxiety.   180 tablet   0   . cyclobenzaprine (FLEXERIL) 5 MG tablet   Oral   Take 1 tablet (5 mg total) by mouth at bedtime.   30 tablet   2   . DULoxetine (CYMBALTA) 20 MG capsule   Oral   Take by mouth. Reported on 01/31/2015         . ibuprofen (ADVIL,MOTRIN) 600 MG tablet   Oral   Take 600 mg by mouth.         . Iron TABS   Oral   Take 60 mg by mouth.         . loratadine (CLARITIN) 10 MG tablet   Oral   Take 10 mg by mouth.         . Meclizine HCl 25 MG CHEW   Oral   Chew 32 mg by mouth.         Marland Kitchen  mirtazapine (REMERON) 15 MG tablet   Oral   Take 15 mg by mouth. Reported on 01/31/2015         . nitrofurantoin, macrocrystal-monohydrate, (MACROBID) 100 MG capsule   Oral   Take 1 capsule (100 mg total) by mouth 2 (two) times daily.   14 capsule   0   . omeprazole (PRILOSEC) 20 MG capsule   Oral   Take 20 mg by mouth daily.         . predniSONE (DELTASONE) 10 MG tablet      Take 6tabs x1day, then 5tabs x1day, then 4tabs x1day, then 3tabs x1day, then 2tabs x1day, then 1tab x1day, then STOP   21 tablet   0   . ranitidine (ZANTAC) 300 MG tablet   Oral   Take 300 mg by mouth at bedtime.         . topiramate (TOPAMAX) 25 MG tablet   Oral   Take 1 tablet (25 mg total) by mouth at bedtime. Call office for refill Patient not taking: Reported on 01/31/2015   30 tablet   0   . traMADol (ULTRAM) 50 MG tablet      Take 1 tablet twice a day   60 tablet   2   . Vitamin D, Ergocalciferol, (DRISDOL) 50000 UNITS CAPS capsule   Oral   Take  by mouth.           Allergies Penicillins and Doxycycline  Family History  Problem Relation Age of Onset  . Arthritis      parents, gradnparents, siblings  . Lung cancer      grandparent  . Hypertension      parent    Social History Social History  Substance Use Topics  . Smoking status: Current Every Day Smoker -- 0.50 packs/day    Types: Cigarettes  . Smokeless tobacco: None  . Alcohol Use: No    Review of Systems  Constitutional: Negative for fever. Eyes: Negative for visual changes. ENT: Negative for sore throat. Cardiovascular: Negative for chest pain. Respiratory: Negative for shortness of breath. Gastrointestinal: Positive for abdominal pain and nausea Genitourinary: Negative for dysuria. Musculoskeletal: Negative for back pain. Skin: Negative for rash. Neurological: Negative for headaches, focal weakness or numbness.   10-point ROS otherwise negative.  ____________________________________________   PHYSICAL EXAM:  VITAL SIGNS: ED Triage Vitals  Enc Vitals Group     BP 02/06/15 1215 119/83 mmHg     Pulse Rate 02/06/15 1215 74     Resp 02/06/15 1215 18     Temp 02/06/15 1215 98.5 F (36.9 C)     Temp Source 02/06/15 1215 Oral     SpO2 02/06/15 1215 100 %     Weight 02/06/15 1215 100 lb (45.36 kg)     Height 02/06/15 1215 5\' 4"  (1.626 m)     Head Cir --      Peak Flow --      Pain Score 02/06/15 1232 7     Pain Loc --      Pain Edu? --      Excl. in [place]? --      Constitutional: Alert and oriented. Well appearing and in no distress. Eyes: Conjunctivae are normal. PERRL. Normal extraocular movements. ENT   Head: Normocephalic and atraumatic.   Nose: No congestion/rhinnorhea.   Mouth/Throat: Mucous membranes are moist.   Neck: No stridor. Hematological/Lymphatic/Immunilogical: No cervical lymphadenopathy. Cardiovascular: Normal rate, regular rhythm. Normal and symmetric distal pulses are present in all extremities. No murmurs,  rubs, or gallops. Respiratory: Normal respiratory effort without tachypnea nor retractions. Breath sounds are clear and equal bilaterally. No wheezes/rales/rhonchi. Gastrointestinal: Soft and nontender. No distention. There is no CVA tenderness. Genitourinary: deferred Musculoskeletal: Nontender with normal range of motion in all extremities. No joint effusions.  No lower extremity tenderness nor edema. Neurologic:  Normal speech and language. No gross focal neurologic deficits are appreciated. Speech is normal.  Skin:  Skin is warm, dry and intact. No rash noted. Psychiatric: Mood and affect are normal. Speech and behavior are normal. Patient exhibits appropriate insight and judgment.  ____________________________________________    LABS (pertinent positives/negatives)  Labs Reviewed  COMPREHENSIVE METABOLIC PANEL - Abnormal; Notable for the following:    AST 14 (*)    ALT 13 (*)    All other components within normal limits  CBC - Abnormal; Notable for the following:    RDW 14.6 (*)    All other components within normal limits  URINALYSIS COMPLETEWITH MICROSCOPIC ([facility] ONLY) - Abnormal; Notable for the following:    Color, Urine YELLOW (*)    APPearance CLEAR (*)    Hgb urine dipstick 2+ (*)    Bacteria, UA RARE (*)    Squamous Epithelial / LPF 0-5 (*)    All other components within normal limits  LIPASE, BLOOD  POCT PREGNANCY, URINE         RADIOLOGY CT Abdomen Pelvis W Contrast (Final result) Result time: 02/06/15 16:30:51   Final result by Rad Results In Interface (02/06/15 16:30:51)   Narrative:   CLINICAL DATA: CP while eating. RIGHT-sided abdominal pain. RIGHT groin. LEFT side abdominal pain. Hematuria.  EXAM: CT ABDOMEN AND PELVIS WITH CONTRAST  TECHNIQUE: Multidetector CT imaging of the abdomen and pelvis was performed using the standard protocol following bolus administration of intravenous contrast.  CONTRAST: 54mL OMNIPAQUE IOHEXOL 300 MG/ML  SOLN  COMPARISON: None.  FINDINGS: Lower chest: Lung bases are clear.  Hepatobiliary: No focal hepatic lesion. There is periportal edema noted. No biliary duct dilatation. Gallbladder is normal and collapsed. Common bile duct normal caliber.  Pancreas: Pancreas is normal. No ductal dilatation. No pancreatic inflammation.  Spleen: Normal spleen  Adrenals/urinary tract: No nephrolithiasis noncontrast exam. There is mild bilateral hydroureter. There is a calculus in the distal LEFT ureter approximately 1 cm from the vascular junction. This calculus measures 2 mm on image 73, series 2. No bladder calculi.  Stomach/Bowel: Stomach, small bowel, appendix, and cecum are normal. Appendix is partially imaged and appears normal (image 33, series 5). The colon and rectosigmoid colon are normal.  Vascular/Lymphatic: Abdominal aorta is normal caliber. There is no retroperitoneal or periportal lymphadenopathy. No pelvic lymphadenopathy.  Reproductive: 3.5 cm simple appearing cyst associated with the RIGHT ovary. Probable corpus luteal cyst the LEFT ovary. Small amount free fluid the pelvis has simple fluid attenuation.  Other: Small free fluid the pelvis as above.  Musculoskeletal: No aggressive osseous lesion.  IMPRESSION: 1. Minimally obstructing calculus within distal LEFT ureter approximately 1 cm from the vesicoureteral junction. 2. Periportal edema likely relates to fluid resuscitation. 3. 3.5 cm cyst of the RIGHT ovary has benign characteristics and is likely a functional ovarian cyst. Cysts of this size in this age group do not require specific follow-up. This recommendation follows ACR consensus guidelines: White Paper of the ACR Incidental Findings Committee II on Adnexal Findings. J Am Coll Radiol [phone].   Electronically Signed By: Suzy Bouchard M.D. On: 02/06/2015 16:30          INITIAL IMPRESSION /  ASSESSMENT AND PLAN / ED  COURSE  Pertinent labs & imaging results that were available during my care of the patient were reviewed by me and considered in my medical decision making (see chart for details).  History physical exam consistent with CT scan finding of a left ureterolithiasis. Patient referred to Dr. Yves Dill urologist on call for outpatient management.  ____________________________________________   FINAL CLINICAL IMPRESSION(S) / ED DIAGNOSES  Final diagnoses:  Kidney stone on left side  Right ovarian cyst      Gregor Hams, MD 02/06/15 1708

## 2015-02-06 NOTE — Discharge Instructions (Signed)
Kidney Stones °Kidney stones (urolithiasis) are deposits that form inside your kidneys. The intense pain is caused by the stone moving through the urinary tract. When the stone moves, the ureter goes into spasm around the stone. The stone is usually passed in the urine.  °CAUSES  °· A disorder that makes certain neck glands produce too much parathyroid hormone (primary hyperparathyroidism). °· A buildup of uric acid crystals, similar to gout in your joints. °· Narrowing (stricture) of the ureter. °· A kidney obstruction present at birth (congenital obstruction). °· Previous surgery on the kidney or ureters. °· Numerous kidney infections. °SYMPTOMS  °· Feeling sick to your stomach (nauseous). °· Throwing up (vomiting). °· Blood in the urine (hematuria). °· Pain that usually spreads (radiates) to the groin. °· Frequency or urgency of urination. °DIAGNOSIS  °· Taking a history and physical exam. °· Blood or urine tests. °· CT scan. °· Occasionally, an examination of the inside of the urinary bladder (cystoscopy) is performed. °TREATMENT  °· Observation. °· Increasing your fluid intake. °· Extracorporeal shock wave lithotripsy--This is a noninvasive procedure that uses shock waves to break up kidney stones. °· Surgery may be needed if you have severe pain or persistent obstruction. There are various surgical procedures. Most of the procedures are performed with the use of small instruments. Only small incisions are needed to accommodate these instruments, so recovery time is minimized. °The size, location, and chemical composition are all important variables that will determine the proper choice of action for you. Talk to your health care provider to better understand your situation so that you will minimize the risk of injury to yourself and your kidney.  °HOME CARE INSTRUCTIONS  °· Drink enough water and fluids to keep your urine clear or pale yellow. This will help you to pass the stone or stone fragments. °· Strain  all urine through the provided strainer. Keep all particulate matter and stones for your health care provider to see. The stone causing the pain may be as small as a grain of salt. It is very important to use the strainer each and every time you pass your urine. The collection of your stone will allow your health care provider to analyze it and verify that a stone has actually passed. The stone analysis will often identify what you can do to reduce the incidence of recurrences. °· Only take over-the-counter or prescription medicines for pain, discomfort, or fever as directed by your health care provider. °· Keep all follow-up visits as told by your health care provider. This is important. °· Get follow-up X-rays if required. The absence of pain does not always mean that the stone has passed. It may have only stopped moving. If the urine remains completely obstructed, it can cause loss of kidney function or even complete destruction of the kidney. It is your responsibility to make sure X-rays and follow-ups are completed. Ultrasounds of the kidney can show blockages and the status of the kidney. Ultrasounds are not associated with any radiation and can be performed easily in a matter of minutes. °· Make changes to your daily diet as told by your health care provider. You may be told to: °¨ Limit the amount of salt that you eat. °¨ Eat 5 or more servings of fruits and vegetables each day. °¨ Limit the amount of meat, poultry, fish, and eggs that you eat. °· Collect a 24-hour urine sample as told by your health care provider. You may need to collect another urine sample every 6-12   months. °SEEK MEDICAL CARE IF: °· You experience pain that is progressive and unresponsive to any pain medicine you have been prescribed. °SEEK IMMEDIATE MEDICAL CARE IF:  °· Pain cannot be controlled with the prescribed medicine. °· You have a fever or shaking chills. °· The severity or intensity of pain increases over 18 hours and is not  relieved by pain medicine. °· You develop a new onset of abdominal pain. °· You feel faint or pass out. °· You are unable to urinate. °  °This information is not intended to replace advice given to you by your health care provider. Make sure you discuss any questions you have with your health care provider. °  °Document Released: 01/28/2005 Document Revised: 10/19/2014 Document Reviewed: 07/01/2012 °Elsevier Interactive Patient Education ©2016 [employer] Inc. ° °

## 2015-02-07 ENCOUNTER — Telehealth: Payer: Self-pay | Admitting: Neurology

## 2015-02-07 NOTE — Telephone Encounter (Signed)
Message relayed to patient. Verbalized understanding and denied questions. Pt states she does not want CD lost in mail, as she had to pay for those. CD's are at front desk, waiting for pickup.

## 2015-02-07 NOTE — Telephone Encounter (Signed)
We have not been able to find any published well-controlled studies or relevant clinical information regarding EDS and Botox for migraine.  However, since she already reports problems with swallowing, I am hesitant about trying Botox.

## 2015-02-07 NOTE — Telephone Encounter (Signed)
VM-PT left message about Botox and also wanting to know where here MRI cd's are/Dawn CB# [phone]

## 2015-02-07 NOTE — Telephone Encounter (Signed)
Patient called to see if she was able to get Botox or not. Per last A&P, "Although there is no absolute contraindication for Botox with EDS, I would like to hold off on it for now and look up if there is any literature regarding it, especially since she already complains of swallowing difficulties. I would have her consider trying topiramate or possibly Depakote. I told her she cannot get pregnant while on these medications."  Pt would also like to get MRI cd back. Please advise.

## 2015-03-10 ENCOUNTER — Telehealth: Payer: Self-pay | Admitting: Family Medicine

## 2015-03-10 NOTE — Telephone Encounter (Signed)
Spoke with patient and she stated that she has had a headache for the last two weeks and nothing has touched the pain. She said that her pain scale is at a 10 right now. She has tried Tramadol, 2 Fioricet, klonopin without any relief. She has had some vision changes a long with the headache. Per Dr. Caryl Bis advised patient that she will need to be seen, but since there is no appointments available in the office today then she needs to go to Urgent care or the ED.

## 2015-03-10 NOTE — Telephone Encounter (Signed)
Pt mom called stating  that Belinda Bryant has had a headache for the past 2-wks.. Pt mom also stated that no medication are touching the pain and wanted to know if there was something else to try before she see NEUROLOGY. Please advise pt [phone].Marland Kitchen

## 2015-03-10 NOTE — Telephone Encounter (Signed)
Noted. Patient was advised by CMA to seek evaluation today.

## 2015-03-13 ENCOUNTER — Ambulatory Visit (INDEPENDENT_AMBULATORY_CARE_PROVIDER_SITE_OTHER): Payer: Self-pay | Admitting: Family Medicine

## 2015-03-13 ENCOUNTER — Inpatient Hospital Stay
Admission: EM | Admit: 2015-03-13 | Discharge: 2015-03-14 | DRG: 081 | Disposition: A | Discharge status: Home / Self Care | Payer: BLUE CROSS/BLUE SHIELD | Attending: Internal Medicine | Admitting: Internal Medicine

## 2015-03-13 ENCOUNTER — Emergency Department: Payer: BLUE CROSS/BLUE SHIELD

## 2015-03-13 ENCOUNTER — Encounter: Payer: Self-pay | Admitting: Emergency Medicine

## 2015-03-13 ENCOUNTER — Encounter: Payer: Self-pay | Admitting: Family Medicine

## 2015-03-13 VITALS — BP 98/68 | HR 81 | Temp 98.7°F | Ht 64.0 in | Wt 99.0 lb

## 2015-03-13 DIAGNOSIS — R42 Dizziness and giddiness: Secondary | ICD-10-CM

## 2015-03-13 DIAGNOSIS — R4781 Slurred speech: Secondary | ICD-10-CM

## 2015-03-13 DIAGNOSIS — F17200 Nicotine dependence, unspecified, uncomplicated: Secondary | ICD-10-CM | POA: Diagnosis present

## 2015-03-13 DIAGNOSIS — R51 Headache: Secondary | ICD-10-CM

## 2015-03-13 DIAGNOSIS — G935 Compression of brain: Principal | ICD-10-CM | POA: Diagnosis present

## 2015-03-13 DIAGNOSIS — R2681 Unsteadiness on feet: Secondary | ICD-10-CM

## 2015-03-13 DIAGNOSIS — R519 Headache, unspecified: Secondary | ICD-10-CM

## 2015-03-13 LAB — BASIC METABOLIC PANEL
Anion gap: 7 (ref 5–15)
BUN: 9 mg/dL (ref 6–20)
CALCIUM: 8.9 mg/dL (ref 8.9–10.3)
CO2: 28 mmol/L (ref 22–32)
CREATININE: 0.69 mg/dL (ref 0.44–1.00)
Chloride: 105 mmol/L (ref 101–111)
GFR calc Af Amer: >60 mL/min (ref >60)
GLUCOSE: 97 mg/dL (ref 65–99)
Potassium: 3.3 mmol/L — ABNORMAL LOW (ref 3.5–5.1)
SODIUM: 140 mmol/L (ref 135–145)

## 2015-03-13 LAB — CBC WITH DIFFERENTIAL/PLATELET
Basophils Absolute: 0 10*3/uL (ref 0–0.1)
Basophils Relative: 0 %
EOS ABS: 0.1 10*3/uL (ref 0–0.7)
EOS PCT: 1 %
HCT: 38.9 % (ref 35.0–47.0)
Hemoglobin: 13.3 g/dL (ref 12.0–16.0)
LYMPHS ABS: 2.7 10*3/uL (ref 1.0–3.6)
LYMPHS PCT: 34 %
MCH: 30.9 pg (ref 26.0–34.0)
MCHC: 34.1 g/dL (ref 32.0–36.0)
MCV: 90.8 fL (ref 80.0–100.0)
MONO ABS: 0.5 10*3/uL (ref 0.2–0.9)
Monocytes Relative: 7 %
Neutro Abs: 4.7 10*3/uL (ref 1.4–6.5)
Neutrophils Relative %: 58 %
PLATELETS: 258 10*3/uL (ref 150–440)
RBC: 4.29 MIL/uL (ref 3.80–5.20)
RDW: 14.5 % (ref 11.5–14.5)
WBC: 8 10*3/uL (ref 3.6–11.0)

## 2015-03-13 MED ORDER — SODIUM CHLORIDE 0.9 % IV BOLUS (SEPSIS)
1000.0000 mL | Freq: Once | INTRAVENOUS | Status: AC
  Administered 2015-03-13: 1000 mL via INTRAVENOUS

## 2015-03-13 MED ORDER — FENTANYL CITRATE (PF) 100 MCG/2ML IJ SOLN
25.0000 ug | Freq: Once | INTRAMUSCULAR | Status: AC
  Administered 2015-03-13: 25 ug via INTRAVENOUS
  Filled 2015-03-13: qty 2

## 2015-03-13 MED ORDER — FENTANYL CITRATE (PF) 100 MCG/2ML IJ SOLN
12.5000 ug | Freq: Once | INTRAMUSCULAR | Status: AC
  Administered 2015-03-13: 12.5 ug via INTRAVENOUS
  Filled 2015-03-13: qty 2

## 2015-03-13 NOTE — Assessment & Plan Note (Signed)
Patient with intractable headache worsened since late last week. She notes blurry vision with this, vomiting with this, and difficulty moving her extremities with this. She has a history of Chiari malformation and Ehlers-Danlos. The concern with her degree of headache and these other issues would be possible progression of her Chiari malformation versus other intracranial process. Given this I advised evaluation in the emergency room for imaging of her head and neck. I advised EMS transport, though she declined this and signed [place] paperwork. She will be transported by her sister. She is given precautions to call EMS in route if they occurred. CMA called the ED charge nurse to inform the patient was on her way.

## 2015-03-13 NOTE — Progress Notes (Signed)
Patient ID: Belinda Bryant, female   DOB: 1980-09-24, 35 y.o.   MRN: [id number]  [name], MD Phone: [phone]  Belinda Marliss Czar Bryant is a 35 y.o. female who presents today for same-day visit.  Patient notes chronic headache. This is worsened over the last several weeks, particularly over the last several days. Trazodone as 25 out of 10. Notes increasing intensity and pressure in her head. She reports that her speech has been off. Her walking has been scuffled as though she can't pick up her feet. Trouble moving her arms as well. She had vomiting over the weekend. She notes at some point she was sitting on the couch and ended up sliding off the couch due to weakness in her body. She notes when she was on the floor she could not move her body. She had no loss of consciousness with this. She also notes some blurry vision. She notes trouble swallowing even applesauce now. She's not drinking very much. She notes feeling a drip in the back of her throat, though no drainage from her nose. She notes the headache starts in the back of her neck and moves over the top of her head. She has taken Fioricet, Excedrin migraine, Klonopin, Flexeril, and tramadol with no benefit.   PMH: Smoker   ROS see history of present illness   Objective  Physical Exam Filed Vitals:   03/13/15 1457  BP: 98/68  Pulse: 81  Temp: 98.7 F (37.1 C)    BP Readings from Last 3 Encounters:  03/13/15 98/68  02/06/15 111/72  01/31/15 96/62   Wt Readings from Last 3 Encounters:  03/13/15 99 lb (44.906 kg)  02/06/15 99 lb (44.906 kg)  01/31/15 99 lb 9.6 oz (45.178 kg)    Physical Exam  Constitutional:  Tired appearing, not diaphoretic, not distressed  HENT:  Head: Normocephalic and atraumatic.  Right Ear: External ear normal.  Left Ear: External ear normal.  Mild posterior oropharyngeal erythema, no exudate or tonsillar swelling  Eyes: Conjunctivae are normal. Pupils are equal, round, and reactive  to light.  Neck: Neck supple.  Cardiovascular: Normal rate, regular rhythm and normal heart sounds.  Exam reveals no gallop and no friction rub.   No murmur heard. Pulmonary/Chest: Effort normal and breath sounds normal. No respiratory distress. She has no wheezes. She has no rales.  Lymphadenopathy:    She has no cervical adenopathy.  Neurological: She is alert.  Slowed speech, otherwise CN 2-12 intact, 4/5 strength in bilateral biceps, triceps, grip, quads, hamstrings, plantar and dorsiflexion, sensation to light touch intact in bilateral UE and LE, slow deliberate gait, 2+ patellar reflexes, positive Romberg, negative pronator drift  Skin: Skin is warm and dry.     Assessment/Plan: Please see individual problem list.  Intractable headache Patient with intractable headache worsened since late last week. She notes blurry vision with this, vomiting with this, and difficulty moving her extremities with this. She has a history of Chiari malformation and Ehlers-Danlos. The concern with her degree of headache and these other issues would be possible progression of her Chiari malformation versus other intracranial process. Given this I advised evaluation in the emergency room for imaging of her head and neck. I advised EMS transport, though she declined this and signed [place] paperwork. She will be transported by her sister. She is given precautions to call EMS in route if they occurred. CMA called the ED charge nurse to inform the patient was on her way.     [name]

## 2015-03-13 NOTE — ED Notes (Addendum)
Pt in via triage w/ complaints of headache since Friday; pt reports changes in vision, speech, unsteady gait.  Pt reports hx of Chiari malformation but states, this headache is worse than usual and unresolved with excedrin and fioricet; states the pressure is too much.  Pt reports nausea/vomiting on Saturday due to headache.  Pt was seen at PCP today and sent to ED.  Pt A/Ox4, with slow speech, vitals WDL.  Family at bedside.

## 2015-03-13 NOTE — ED Provider Notes (Signed)
[facility] Emergency Department Provider Note  ____________________________________________  Time seen: 11:05 PM  I have reviewed the triage vital signs and the nursing notes.   HISTORY  Chief Complaint Headache      HPI Belinda Bryant is a 35 y.o. female presents with persistent progressive headache since Saturday with unsteady gait nausea and vomiting as well as slurred speech and blurred vision. Patient states current pain score 7 out of 10 after receiving fentanyl or my arrival to the room. Patient states that pain is not controlled Excedrin Migraine as well as Fioricet at home. Of note patient was seen by Dr. Caryl Bis today and referred to the emergency department for an MRI.    Past Medical History  Diagnosis Date  . Arthritis   . Chicken pox   . Headache   . Chiari malformation   . GERD (gastroesophageal reflux disease)   . Allergic rhinitis   . Migraine   . UTI (lower urinary tract infection)   . Ehlers-Danlos disease     Patient Active Problem List   Diagnosis Date Noted  . Intractable headache 03/13/2015  . Hematuria 01/31/2015  . Depression 01/17/2015  . Sweating 01/17/2015  . Abnormality of gait 12/29/2014  . Intractable chronic migraine without aura and without status migrainosus 12/27/2014  . 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 Allergic rhinitis 12/07/2014  . Cough 12/07/2014  . Vitamin D deficiency 12/07/2014  . Anxiety 12/07/2014  . Headache 11/24/2014  . Chronic abdominal pain 11/24/2014  . Ehlers-Danlos disease 11/24/2014  . Palpitations 11/24/2014  . Muscle twitching 11/23/2014    Past Surgical History  Procedure Laterality Date  . Myomectomy    . Wisdom    . Dnc    . Cervical polypectomy      Current Outpatient Rx  Name  Route  Sig  Dispense  Refill  . aspirin-acetaminophen-caffeine (EXCEDRIN MIGRAINE) 250-250-65 MG tablet   Oral   Take by mouth.         .  butalbital-acetaminophen-caffeine (FIORICET, ESGIC) 50-325-40 MG tablet   Oral   Take by mouth.         . butalbital-aspirin-caffeine (FIORINAL) 50-325-40 MG capsule   Oral   Take by mouth. Reported on 01/31/2015         . Cholecalciferol (VITAMIN D3) 50000 UNITS CAPS   Oral   Take 1 capsule by mouth once a week.   8 capsule   0   . clonazePAM (KLONOPIN) 1 MG tablet   Oral   Take 1 tablet (1 mg total) by mouth 2 (two) times daily as needed for anxiety.   180 tablet   0   . cyclobenzaprine (FLEXERIL) 5 MG tablet   Oral   Take 1 tablet (5 mg total) by mouth at bedtime.   30 tablet   2   . DULoxetine (CYMBALTA) 20 MG capsule   Oral   Take by mouth. Reported on 01/31/2015         . ibuprofen (ADVIL,MOTRIN) 600 MG tablet   Oral   Take 600 mg by mouth.         . Iron TABS   Oral   Take 60 mg by mouth.         . loratadine (CLARITIN) 10 MG tablet   Oral   Take 10 mg by mouth.         . Meclizine HCl 25 MG CHEW   Oral   Chew 32 mg by mouth.         Marland Kitchen  mirtazapine (REMERON) 15 MG tablet   Oral   Take 15 mg by mouth. Reported on 01/31/2015         . nitrofurantoin, macrocrystal-monohydrate, (MACROBID) 100 MG capsule   Oral   Take 1 capsule (100 mg total) by mouth 2 (two) times daily.   14 capsule   0   . omeprazole (PRILOSEC) 20 MG capsule   Oral   Take 20 mg by mouth daily.         . ondansetron (ZOFRAN-ODT) 4 MG disintegrating tablet   Oral   Take 1 tablet (4 mg total) by mouth every 8 (eight) hours as needed for nausea or vomiting.   20 tablet   0   . oxyCODONE-acetaminophen (PERCOCET/ROXICET) 5-325 MG tablet   Oral   Take 1 tablet by mouth every 4 (four) hours as needed for severe pain.   20 tablet   0   . predniSONE (DELTASONE) 10 MG tablet      Take 6tabs x1day, then 5tabs x1day, then 4tabs x1day, then 3tabs x1day, then 2tabs x1day, then 1tab x1day, then STOP   21 tablet   0   . ranitidine (ZANTAC) 300 MG tablet   Oral    Take 300 mg by mouth at bedtime.         . topiramate (TOPAMAX) 25 MG tablet   Oral   Take 1 tablet (25 mg total) by mouth at bedtime. Call office for refill   30 tablet   0   . traMADol (ULTRAM) 50 MG tablet      Take 1 tablet twice a day   60 tablet   2   . Vitamin D, Ergocalciferol, (DRISDOL) 50000 UNITS CAPS capsule   Oral   Take by mouth.           Allergies Penicillins and Doxycycline  Family History  Problem Relation Age of Onset  . Arthritis      parents, gradnparents, siblings  . Lung cancer      grandparent  . Hypertension      parent    Social History Social History  Substance Use Topics  . Smoking status: Current Every Day Smoker -- 0.50 packs/day    Types: Cigarettes  . Smokeless tobacco: None  . Alcohol Use: No    Review of Systems  Constitutional: Negative for fever. Eyes: Negative for visual changes. ENT: Negative for sore throat. Cardiovascular: Negative for chest pain. Respiratory: Negative for shortness of breath. Gastrointestinal: Negative for abdominal pain, vomiting and diarrhea. Genitourinary: Negative for dysuria. Musculoskeletal: Negative for back pain. Skin: Negative for rash. Neurological: Positive for headaches, slurred speech blurred vision and unsteady gait.   10-point ROS otherwise negative.  ____________________________________________   PHYSICAL EXAM:  VITAL SIGNS: ED Triage Vitals  Enc Vitals Group     BP 03/13/15 1654 115/77 mmHg     Pulse Rate 03/13/15 1654 76     Resp 03/13/15 1654 19     Temp 03/13/15 1654 97.8 F (36.6 C)     Temp Source 03/13/15 1654 Oral     SpO2 03/13/15 1654 99 %     Weight 03/13/15 1654 99 lb (44.906 kg)     Height 03/13/15 1654 5\' 4"  (1.626 m)     Head Cir --      Peak Flow --      Pain Score 03/13/15 1703 10     Pain Loc --      Pain Edu? --      Excl.  in [place]? --      Constitutional: Alert and oriented. Well appearing and in no distress. Eyes: Conjunctivae are normal.  PERRL. Normal extraocular movements. ENT   Head: Normocephalic and atraumatic.   Nose: No congestion/rhinnorhea.   Mouth/Throat: Mucous membranes are moist.   Neck: No stridor. Hematological/Lymphatic/Immunilogical: No cervical lymphadenopathy. Cardiovascular: Normal rate, regular rhythm. Normal and symmetric distal pulses are present in all extremities. No murmurs, rubs, or gallops. Respiratory: Normal respiratory effort without tachypnea nor retractions. Breath sounds are clear and equal bilaterally. No wheezes/rales/rhonchi. Gastrointestinal: Soft and nontender. No distention. There is no CVA tenderness. Genitourinary: deferred Musculoskeletal: Nontender with normal range of motion in all extremities. No joint effusions.  No lower extremity tenderness nor edema. Neurologic:  Normal speech and language. No gross focal neurologic deficits are appreciated. Speech is normal.  Skin:  Skin is warm, dry and intact. No rash noted. Psychiatric: Mood and affect are normal. Speech and behavior are normal. Patient exhibits appropriate insight and judgment.  ____________________________________________    LABS (pertinent positives/negatives)  Labs Reviewed  BASIC METABOLIC PANEL - Abnormal; Notable for the following:    Potassium 3.3 (*)    All other components within normal limits  CBC WITH DIFFERENTIAL/PLATELET       RADIOLOGY      MR Angiogram Neck W Wo Contrast (Final result) Result time: 03/14/15 03:16:50   Procedure changed from MR Angiogram Neck W Contrast      Final result by Rad Results In Interface (03/14/15 03:16:50)   Narrative:   CLINICAL DATA: Initial evaluation for 1 week history of increasing headaches and nausea.  EXAM: MRI HEAD WITHOUT AND WITH CONTRAST  MRA HEAD WITHOUT CONTRAST  MRA NECK WITHOUT AND WITH CONTRAST  TECHNIQUE: Multiplanar, multiecho pulse sequences of the brain and surrounding structures were obtained without and with  intravenous contrast. Angiographic images of the Circle of Willis were obtained using MRA technique without intravenous contrast. Angiographic images of the neck were obtained using MRA technique without and with intravenous contrast. Carotid stenosis measurements (when applicable) are obtained utilizing NASCET criteria, using the distal internal carotid diameter as the denominator.  CONTRAST: 11mL MULTIHANCE GADOBENATE DIMEGLUMINE 529 MG/ML IV SOLN  COMPARISON: None.  FINDINGS: MRI HEAD FINDINGS  Cerebral volume within normal limits for patient age. No focal parenchymal signal abnormality.  No abnormal foci of restricted diffusion to suggest acute intracranial infarct. Gray-white matter differentiation well maintained. Normal intravascular flow voids preserved. No acute or chronic intracranial hemorrhage. No areas of chronic infarction.  No mass lesion, midline shift, or mass effect. No hydrocephalus. No extra-axial fluid collection. No abnormal enhancement.  Chiari 1 malformation present with the cerebellar tonsils positioned up to 1.6 cm below the foramen magnum. The cerebellar tonsils are somewhat peak on sagittal FLAIR sequence. No associated syrinx.  Pituitary gland normal. Globes and orbits within normal limits.  Paranasal sinuses and mastoid air cells are clear. Inner ear structures normal.  Bone marrow signal intensity within normal limits. No scalp soft tissue abnormality.  MRA HEAD FINDINGS  ANTERIOR CIRCULATION:  Visualized distal cervical segments of the internal carotid arteries are widely patent with antegrade flow. Petrous, cavernous, and supraclinoid segments are widely patent. A1 segments, anterior communicating artery, anterior slow or arteries well opacified and normal. M1 segments widely patent without stenosis or occlusion. MCA bifurcations within normal limits. Distal MCA branches well opacified and symmetric.  POSTERIOR  CIRCULATION:  Vertebral arteries are widely patent to the vertebrobasilar junction. Posterior inferior cerebral arteries patent bilaterally. Basilar  artery widely patent to its distal aspect. Dominant left anterior inferior cerebral artery noted. Superior cerebral arteries patent bilaterally. Both posterior cerebral arteries arise from the basilar artery and are well opacified to their distal aspects. Small posterior communicating arteries noted bilaterally.  No aneurysm or vascular malformation.  MRA NECK FINDINGS  Partially visualized aortic arch grossly unremarkable. No high-grade stenosis at the origin of the great vessels. Partially visualized subclavian arteries within normal limits.  Right common carotid artery widely patent from its origin to the bifurcation. No significant atheromatous disease about the right trifurcation. Right ICA widely patent from the bifurcation to the circle of Willis. No stenosis, evidence for dissection, or vascular occlusion within the right carotid artery system. Right external carotid artery is branches grossly normal.  The left common carotid artery widely patent from its origin to the bifurcation. No significant atheromatous disease about the bifurcation. Left ICA widely patent from the bifurcation to the circle of Willis. No stenosis, dissection, or vascular occlusion within the left carotid artery system. Left external carotid artery and its branches grossly normal.  Both vertebral arteries arise from the subclavian arteries. Vertebral arteries widely patent with antegrade flow without evidence for dissection, stenosis, or occlusion.  IMPRESSION: MRI HEAD IMPRESSION:  1. Chiari 1 malformation. 2. Otherwise normal brain MRI.  MRA HEAD IMPRESSION:  Normal intracranial MRA.  MRA NECK IMPRESSION:  Normal MRA of the neck.   Electronically Signed By: Jeannine Boga M.D. On: 03/14/2015 03:16          MR MRA HEAD  WO CONTRAST (Final result) Result time: 03/14/15 03:16:50   Procedure changed from MR Angiogram Head W Contrast      Final result by Rad Results In Interface (03/14/15 03:16:50)   Narrative:   CLINICAL DATA: Initial evaluation for 1 week history of increasing headaches and nausea.  EXAM: MRI HEAD WITHOUT AND WITH CONTRAST  MRA HEAD WITHOUT CONTRAST  MRA NECK WITHOUT AND WITH CONTRAST  TECHNIQUE: Multiplanar, multiecho pulse sequences of the brain and surrounding structures were obtained without and with intravenous contrast. Angiographic images of the Circle of Willis were obtained using MRA technique without intravenous contrast. Angiographic images of the neck were obtained using MRA technique without and with intravenous contrast. Carotid stenosis measurements (when applicable) are obtained utilizing NASCET criteria, using the distal internal carotid diameter as the denominator.  CONTRAST: 54mL MULTIHANCE GADOBENATE DIMEGLUMINE 529 MG/ML IV SOLN  COMPARISON: None.  FINDINGS: MRI HEAD FINDINGS  Cerebral volume within normal limits for patient age. No focal parenchymal signal abnormality.  No abnormal foci of restricted diffusion to suggest acute intracranial infarct. Gray-white matter differentiation well maintained. Normal intravascular flow voids preserved. No acute or chronic intracranial hemorrhage. No areas of chronic infarction.  No mass lesion, midline shift, or mass effect. No hydrocephalus. No extra-axial fluid collection. No abnormal enhancement.  Chiari 1 malformation present with the cerebellar tonsils positioned up to 1.6 cm below the foramen magnum. The cerebellar tonsils are somewhat peak on sagittal FLAIR sequence. No associated syrinx.  Pituitary gland normal. Globes and orbits within normal limits.  Paranasal sinuses and mastoid air cells are clear. Inner ear structures normal.  Bone marrow signal intensity within normal limits. No scalp  soft tissue abnormality.  MRA HEAD FINDINGS  ANTERIOR CIRCULATION:  Visualized distal cervical segments of the internal carotid arteries are widely patent with antegrade flow. Petrous, cavernous, and supraclinoid segments are widely patent. A1 segments, anterior communicating artery, anterior slow or arteries well opacified and normal. M1 segments  widely patent without stenosis or occlusion. MCA bifurcations within normal limits. Distal MCA branches well opacified and symmetric.  POSTERIOR CIRCULATION:  Vertebral arteries are widely patent to the vertebrobasilar junction. Posterior inferior cerebral arteries patent bilaterally. Basilar artery widely patent to its distal aspect. Dominant left anterior inferior cerebral artery noted. Superior cerebral arteries patent bilaterally. Both posterior cerebral arteries arise from the basilar artery and are well opacified to their distal aspects. Small posterior communicating arteries noted bilaterally.  No aneurysm or vascular malformation.  MRA NECK FINDINGS  Partially visualized aortic arch grossly unremarkable. No high-grade stenosis at the origin of the great vessels. Partially visualized subclavian arteries within normal limits.  Right common carotid artery widely patent from its origin to the bifurcation. No significant atheromatous disease about the right trifurcation. Right ICA widely patent from the bifurcation to the circle of Willis. No stenosis, evidence for dissection, or vascular occlusion within the right carotid artery system. Right external carotid artery is branches grossly normal.  The left common carotid artery widely patent from its origin to the bifurcation. No significant atheromatous disease about the bifurcation. Left ICA widely patent from the bifurcation to the circle of Willis. No stenosis, dissection, or vascular occlusion within the left carotid artery system. Left external carotid artery and its  branches grossly normal.  Both vertebral arteries arise from the subclavian arteries. Vertebral arteries widely patent with antegrade flow without evidence for dissection, stenosis, or occlusion.  IMPRESSION: MRI HEAD IMPRESSION:  1. Chiari 1 malformation. 2. Otherwise normal brain MRI.  MRA HEAD IMPRESSION:  Normal intracranial MRA.  MRA NECK IMPRESSION:  Normal MRA of the neck.   Electronically Signed By: Jeannine Boga M.D. On: 03/14/2015 03:16          MR Brain W Wo Contrast (Final result) Result time: 03/14/15 03:16:50   Final result by Rad Results In Interface (03/14/15 03:16:50)   Narrative:   CLINICAL DATA: Initial evaluation for 1 week history of increasing headaches and nausea.  EXAM: MRI HEAD WITHOUT AND WITH CONTRAST  MRA HEAD WITHOUT CONTRAST  MRA NECK WITHOUT AND WITH CONTRAST  TECHNIQUE: Multiplanar, multiecho pulse sequences of the brain and surrounding structures were obtained without and with intravenous contrast. Angiographic images of the Circle of Willis were obtained using MRA technique without intravenous contrast. Angiographic images of the neck were obtained using MRA technique without and with intravenous contrast. Carotid stenosis measurements (when applicable) are obtained utilizing NASCET criteria, using the distal internal carotid diameter as the denominator.  CONTRAST: 63mL MULTIHANCE GADOBENATE DIMEGLUMINE 529 MG/ML IV SOLN  COMPARISON: None.  FINDINGS: MRI HEAD FINDINGS  Cerebral volume within normal limits for patient age. No focal parenchymal signal abnormality.  No abnormal foci of restricted diffusion to suggest acute intracranial infarct. Gray-white matter differentiation well maintained. Normal intravascular flow voids preserved. No acute or chronic intracranial hemorrhage. No areas of chronic infarction.  No mass lesion, midline shift, or mass effect. No hydrocephalus. No extra-axial fluid  collection. No abnormal enhancement.  Chiari 1 malformation present with the cerebellar tonsils positioned up to 1.6 cm below the foramen magnum. The cerebellar tonsils are somewhat peak on sagittal FLAIR sequence. No associated syrinx.  Pituitary gland normal. Globes and orbits within normal limits.  Paranasal sinuses and mastoid air cells are clear. Inner ear structures normal.  Bone marrow signal intensity within normal limits. No scalp soft tissue abnormality.  MRA HEAD FINDINGS  ANTERIOR CIRCULATION:  Visualized distal cervical segments of the internal carotid arteries are widely patent with  antegrade flow. Petrous, cavernous, and supraclinoid segments are widely patent. A1 segments, anterior communicating artery, anterior slow or arteries well opacified and normal. M1 segments widely patent without stenosis or occlusion. MCA bifurcations within normal limits. Distal MCA branches well opacified and symmetric.  POSTERIOR CIRCULATION:  Vertebral arteries are widely patent to the vertebrobasilar junction. Posterior inferior cerebral arteries patent bilaterally. Basilar artery widely patent to its distal aspect. Dominant left anterior inferior cerebral artery noted. Superior cerebral arteries patent bilaterally. Both posterior cerebral arteries arise from the basilar artery and are well opacified to their distal aspects. Small posterior communicating arteries noted bilaterally.  No aneurysm or vascular malformation.  MRA NECK FINDINGS  Partially visualized aortic arch grossly unremarkable. No high-grade stenosis at the origin of the great vessels. Partially visualized subclavian arteries within normal limits.  Right common carotid artery widely patent from its origin to the bifurcation. No significant atheromatous disease about the right trifurcation. Right ICA widely patent from the bifurcation to the circle of Willis. No stenosis, evidence for dissection, or  vascular occlusion within the right carotid artery system. Right external carotid artery is branches grossly normal.  The left common carotid artery widely patent from its origin to the bifurcation. No significant atheromatous disease about the bifurcation. Left ICA widely patent from the bifurcation to the circle of Willis. No stenosis, dissection, or vascular occlusion within the left carotid artery system. Left external carotid artery and its branches grossly normal.  Both vertebral arteries arise from the subclavian arteries. Vertebral arteries widely patent with antegrade flow without evidence for dissection, stenosis, or occlusion.  IMPRESSION: MRI HEAD IMPRESSION:  1. Chiari 1 malformation. 2. Otherwise normal brain MRI.  MRA HEAD IMPRESSION:  Normal intracranial MRA.  MRA NECK IMPRESSION:  Normal MRA of the neck.   Electronically Signed By: Jeannine Boga M.D. On: 03/14/2015 03:16          CT Head Wo Contrast (Final result) Result time: 03/13/15 19:25:54   Final result by Rad Results In Interface (03/13/15 19:25:54)   Narrative:   CLINICAL DATA: Unsteady gait with increased headache. Patient with history of Chiari malformation.  EXAM: CT HEAD WITHOUT CONTRAST  TECHNIQUE: Contiguous axial images were obtained from the base of the skull through the vertex without intravenous contrast.  COMPARISON: None.  FINDINGS: Questionable cerebellar tonsillar crowding at the skullbase, supporting stated history of Chiari malformation. No intracranial hemorrhage, mass effect, or midline shift. No hydrocephalus. No evidence of territorial infarct or acute ischemia. No intracranial fluid collection. Calvarium is intact. Included paranasal sinuses and mastoid air cells are well aerated.  IMPRESSION: Questionable cerebellar tonsillar crowding at skullbase, supporting stated history of Chiari malformation. No acute intracranial abnormality is  seen.   Electronically Signed By: Jeb Levering M.D. On: 03/13/2015 19:25          INITIAL IMPRESSION / ASSESSMENT AND PLAN / ED COURSE  Pertinent labs & imaging results that were available during my care of the patient were reviewed by me and considered in my medical decision making (see chart for details).  Patient received IV Fentanyl for analgesia. I discussed the MRI findings with the patient and offered admission for further evaluation and management however she refused stating that she has a neurology appointment on February 5  ____________________________________________   FINAL CLINICAL IMPRESSION(S) / ED DIAGNOSES  Final diagnoses:  Intractable headache  Chiari malformation type I ([place])      Gregor Hams, MD 03/14/15 [phone]

## 2015-03-13 NOTE — Patient Instructions (Signed)
Nice to see you. Please go immediately to the emergency room for evaluation of your issues. I advised EMS transport your opting to transport herself. If you develop worsening headache, numbness, weakness, vision changes, slurred speech, chest pain, shortness of breath, syncope, or any new or changing symptoms please call 911 immediately.

## 2015-03-13 NOTE — ED Notes (Signed)
Patient transported to MRI 

## 2015-03-13 NOTE — ED Notes (Signed)
Spoke with dr Clearnce Hasten about pt. Ordered testing per his recommendation.

## 2015-03-13 NOTE — ED Notes (Signed)
Over last week pt has been having increasing headaches and nausea.  Started having vomiting over weekend.  Pt has had difficulty walking and moving right arm on Friday but mostly Saturday. Also had trouble moving right arm on Saturday.   Still has some difficulty walking per family.  On Saturday pt began slow getting slow to respond but was still speaking.  Hx chiari malformation I; unable to have surgery at this time.  PCP thinks pt needs a MRI.  Pain worse so sent pt to ED.

## 2015-03-13 NOTE — ED Notes (Signed)
MD at bedside. 

## 2015-03-13 NOTE — Progress Notes (Signed)
Pre visit review using our clinic review tool, if applicable. No additional management support is needed unless otherwise documented below in the visit note. 

## 2015-03-14 DIAGNOSIS — R42 Dizziness and giddiness: Secondary | ICD-10-CM

## 2015-03-14 MED ORDER — GADOBENATE DIMEGLUMINE 529 MG/ML IV SOLN
10.0000 mL | Freq: Once | INTRAVENOUS | Status: AC | PRN
  Administered 2015-03-14: 8 mL via INTRAVENOUS

## 2015-03-14 NOTE — ED Notes (Signed)
Pt. Going home with family. 

## 2015-03-14 NOTE — ED Notes (Signed)
Pt. Advised she would be admitted to hospital.

## 2015-03-14 NOTE — ED Notes (Signed)
Pt. Decided to go home.  Pt. States she will return if HA worsens.

## 2015-03-14 NOTE — ED Notes (Signed)
Patient returned from MRI.

## 2015-03-14 NOTE — Discharge Instructions (Signed)
Chiari Malformation Chiari malformation (CM) is a type of brain abnormality that involves the parts of your brain that are important for balance (cerebellum) and basic body functions (brain stem). Normally, the cerebellum is located in a space at the back part of the skull, just above the opening for the spinal cord (foramen magnum). With CM, part of the cerebellum is located below the foramen magnum instead. This can cause neck pain, balance problems, and other symptoms. There are five types of CM:  Type I.  This is the most common type. It can cause cerebrospinal fluid (CSF) to not flow between your brain and spinal cord as it normally should.  This type may not cause symptoms, and it often goes unnoticed.  Type II.  This type is present at birth (congenital), and it usually involves a condition in which the spine does not form properly (spina bifida).  Type II also involves having a larger portion of brain structures move down and push through (herniate) the foramen magnum into the spinal canal.  Type III.  This type is more severe than Types I and II, and it is the least common type.  Type III often occurs with a type of congenital disability in which an opening in the skull causes the cerebellum and brain stem and their coverings to bulge out in a sac (encephalocele).  Type IV.  This is also a severe form of CM.  Part of the cerebellum may be missing, and parts of the spine and skull may be visible.  Type 0.  In this type, the cerebellum does not herniate into or below the foramen magnum. However, abnormal flow of CSF between the brain and spinal cord creates a collection of fluid inside the spinal cord (syrinx). This may cause symptoms. CAUSES CM is most commonly congenital. CM can occur when the skull forms incorrectly and provides less space for the cerebellum than normal. In rare cases, CM may also develop later in life (acquired CM or secondary CM). These cases may be  caused by:  Injury.  Infection. Abnormal structure or pressure develops in the brain, and this pushes the cerebellum down into the foramen magnum. RISK FACTORS Congenital CM is more likely to occur in:  Females.  People who have a family history of CM. SYMPTOMS CM may not cause any symptoms. Symptoms may also vary depending on the type and severity of CM. Symptoms may also come and go. The most common symptom is a severe headache in the back of the head. Headache pain may come and go. It may also spread to your neck and shoulders. The pain may be worse when you cough, sneeze, or strain. Other symptoms include:  Difficulty balancing.  Loss of coordination.  Trouble swallowing or speaking.  Muscle weakness.  Feeling dizzy.  Ringing in the ears.  Fainting.  Trouble sleeping.  Fatigue.  Tingling or burning sensations in the fingers or toes.  Hearing problems.  Vision problems.  Vomiting.  Depression.  Seizures. These are only present in more severe types of CM. DIAGNOSIS This condition may be diagnosed with a medical history and physical exam. This may include tests to check your balance and your memory. You may also have other tests, including:  X-ray.  CT scan.  MRI. TREATMENT Treatment for this condition depends on your symptoms and the type of CM that you have. If you have headaches or neck pain, you may be treated with pain medicine or massage therapy. If you have symptoms of  CM that are severe or are getting worse, your health care provider may recommend surgery to control your symptoms and prevent the malformation from getting worse. If you do not have symptoms, you may not need treatment. HOME CARE INSTRUCTIONS  Take medicines only as directed by your health care provider.  Avoid activities that involve straining and heavy lifting.  Consider joining a CM support group.  Keep all follow-up visits as directed by your health care provider. This is  important. SEEK MEDICAL CARE IF:  You have new symptoms.  Your symptoms get worse. SEEK IMMEDIATE MEDICAL CARE IF:  You have seizures that are new or different than before.   This information is not intended to replace advice given to you by your health care provider. Make sure you discuss any questions you have with your health care provider.   Document Released: 01/18/2002 Document Revised: 06/14/2014 Document Reviewed: 10/06/2013 Elsevier Interactive Patient Education [employer].

## 2015-03-16 ENCOUNTER — Other Ambulatory Visit: Payer: Self-pay | Admitting: Family Medicine

## 2015-03-17 NOTE — Telephone Encounter (Signed)
Please advise on refill.

## 2015-03-17 NOTE — Telephone Encounter (Signed)
Refill given

## 2015-03-17 NOTE — Telephone Encounter (Signed)
LM on patients phone that RX was faxed to pharmacy

## 2015-03-20 DIAGNOSIS — F09 Unspecified mental disorder due to known physiological condition: Secondary | ICD-10-CM | POA: Insufficient documentation

## 2015-03-20 DIAGNOSIS — R51 Headache: Secondary | ICD-10-CM

## 2015-03-20 DIAGNOSIS — R519 Headache, unspecified: Secondary | ICD-10-CM | POA: Insufficient documentation

## 2015-03-20 DIAGNOSIS — R2689 Other abnormalities of gait and mobility: Secondary | ICD-10-CM | POA: Insufficient documentation

## 2015-03-20 DIAGNOSIS — Q0700 Arnold-Chiari syndrome without spina bifida or hydrocephalus: Secondary | ICD-10-CM | POA: Insufficient documentation

## 2015-03-24 ENCOUNTER — Ambulatory Visit (INDEPENDENT_AMBULATORY_CARE_PROVIDER_SITE_OTHER): Payer: BLUE CROSS/BLUE SHIELD | Admitting: Family Medicine

## 2015-03-24 ENCOUNTER — Encounter: Payer: Self-pay | Admitting: Family Medicine

## 2015-03-24 VITALS — BP 102/70 | HR 102 | Temp 98.9°F | Ht 64.0 in | Wt 98.2 lb

## 2015-03-24 DIAGNOSIS — R109 Unspecified abdominal pain: Secondary | ICD-10-CM | POA: Diagnosis not present

## 2015-03-24 DIAGNOSIS — G8929 Other chronic pain: Secondary | ICD-10-CM

## 2015-03-24 DIAGNOSIS — G43719 Chronic migraine without aura, intractable, without status migrainosus: Secondary | ICD-10-CM

## 2015-03-24 DIAGNOSIS — F329 Major depressive disorder, single episode, unspecified: Secondary | ICD-10-CM | POA: Diagnosis not present

## 2015-03-24 DIAGNOSIS — N83209 Unspecified ovarian cyst, unspecified side: Secondary | ICD-10-CM

## 2015-03-24 DIAGNOSIS — F32A Depression, unspecified: Secondary | ICD-10-CM

## 2015-03-24 MED ORDER — BUTALBITAL-APAP-CAFFEINE 50-325-40 MG PO TABS: 1.0000 | ORAL_TABLET | Freq: Two times a day (BID) | ORAL | Status: DC | PRN

## 2015-03-24 MED ORDER — ONDANSETRON 4 MG PO TBDP: 4.0000 mg | ORAL_TABLET | Freq: Three times a day (TID) | ORAL | Status: DC | PRN

## 2015-03-24 NOTE — Progress Notes (Signed)
Pre visit review using our clinic review tool, if applicable. No additional management support is needed unless otherwise documented below in the visit note. 

## 2015-03-24 NOTE — Patient Instructions (Addendum)
Nice to see you. I have refilled her Zofran. You can take this as prescribed for nausea. Please keep an eye out for the gastroenterology follow-up. Please continue to see a therapist. Please keep working on taking the gabapentin. If you are not noticing a difference or your jitteriness is continuing please let us know. And please let the neurologist know. If you develop worsening headache, numbness, weakness, thoughts of harming herself or others, abdominal pain or stool, or any new change in symptoms please seek medical attention.  Please take the Fioricet 2 times daily for the next 2 weeks and then 1 time daily for 2 weeks following that. And then stopped taking it.

## 2015-03-26 DIAGNOSIS — N83209 Unspecified ovarian cyst, unspecified side: Secondary | ICD-10-CM | POA: Insufficient documentation

## 2015-03-26 NOTE — Progress Notes (Signed)
Patient ID: Belinda Bryant, female   DOB: [birth year], 35 y.o.   MRN: [id number]  [name], MD Phone: [phone]  Belinda Bryant is a 35 y.o. female who presents today for follow-up.  Patient previously seen for headaches and sent to emergency room for evaluation. 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 She followed up with neurology for this. States the neurologist was okay. States that the headaches are likely due to the Chiari malformation. Surgery not the best option. She was surprised that the herniation has occurred so quickly. She started her on gabapentin. Notes this is not improved. She notes she feels jittery on the gabapentin. No changes in her weakness. No changes in her headaches. Note she has seen a therapist as well. This is a chronic pain therapist named Renata Caprice. She sees him once a week. She feels this is helping some. She is able to talk to him. They're going to possibly try hypnosis. She's taking Klonopin 1 mg 1-3 times per day. No SI or HI. Note she also saw gynecology for her ovarian cyst. 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 She notes some bloating in her stomach. Some mild constipation. No blood in her stool. No abdominal pain. Nausea is increased.  PMH: Current smoker   ROS see history of present illness  Objective  Physical Exam Filed Vitals:   03/24/15 1037  BP: 102/70  Pulse: 102  Temp: 98.9 F (37.2 C)    BP Readings from Last 3 Encounters:  03/24/15 102/70  03/14/15 111/76  03/13/15 98/68   Wt Readings from Last 3 Encounters:  03/24/15 98 lb 3.2 oz (44.543 kg)  03/13/15 99 lb (44.906 kg)  03/13/15 99 lb (44.906 kg)    Physical Exam  Constitutional: No distress.  thin  HENT:  Head: Normocephalic and atraumatic.  Right Ear: External ear normal.  Left Ear: External ear normal.  Mouth/Throat: Oropharynx is clear and moist.  No oropharyngeal exudate.  Eyes: Conjunctivae are normal. Pupils are equal, round, and reactive to light.  Cardiovascular: Normal rate, regular rhythm and normal heart sounds.   Pulmonary/Chest: Effort normal and breath sounds normal.  Abdominal: Soft. Bowel sounds are normal. She exhibits no distension. There is no tenderness. There is no rebound and no guarding.  Neurological: She is alert.  CN 2-12 intact, 5/5 strength in bilateral biceps, triceps, grip, quads, hamstrings, plantar and dorsiflexion, sensation to light touch intact in bilateral UE and LE, normal gait, 2+ patellar reflexes  Skin: Skin is warm and dry. She is not diaphoretic.  Psychiatric:  Mood depressed, flat affect     Assessment/Plan: Please see individual problem list.  Intractable chronic migraine without aura and without status migrainosus Headaches likely related to her Chiari malformation. Has been on gabapentin. Only taking this once a day. Was advised by her neurologist to limit use of Fioricet for her headaches. Patient states Fioricet as it only thing that helps. She is neurologically intact. We will refill her Fioricet with the instructions to taper off using 2 per day for the next 2 weeks and then 1 per day for the 2 weeks after that and then stopping. She will continue gabapentin and try to increase this as directed by the neurologist. She is given return precautions.  Chronic abdominal pain No abdominal pain at this time. Does note bloating and nausea. Likely related to her Ehlers-Danlos. Benign abdominal exam. She's been referred to GI that  specializes in Ehlers-Danlos to evaluate this. She will follow-up with them. We will refill her Zofran. Given return precautions.  Depression Stable. No SI or HI. Working with the therapist as she refuses medications such as SSRIs and similar medications. We will continue Klonopin. Advised not to take more than 3 tablets per day. Given return precautions.  Ovarian  cyst Continue to follow with gynecology.    No orders of the defined types were placed in this encounter.    Meds ordered this encounter  Medications  . ondansetron (ZOFRAN-ODT) 4 MG disintegrating tablet    Sig: Take 1 tablet (4 mg total) by mouth every 8 (eight) hours as needed for nausea or vomiting.    Dispense:  60 tablet    Refill:  0  . butalbital-acetaminophen-caffeine (FIORICET, ESGIC) 50-325-40 MG tablet    Sig: Take 1 tablet by mouth 2 (two) times daily as needed for headache.    Dispense:  30 tablet    Refill:  0    [name]

## 2015-03-26 NOTE — Assessment & Plan Note (Signed)
Continue to follow with gynecology.

## 2015-03-26 NOTE — Assessment & Plan Note (Signed)
Stable. No SI or HI. Working with the therapist as she refuses medications such as SSRIs and similar medications. We will continue Klonopin. Advised not to take more than 3 tablets per day. Given return precautions.

## 2015-03-26 NOTE — Assessment & Plan Note (Signed)
Headaches likely related to her Chiari malformation. Has been on gabapentin. Only taking this once a day. Was advised by her neurologist to limit use of Fioricet for her headaches. Patient states Fioricet as it only thing that helps. She is neurologically intact. We will refill her Fioricet with the instructions to taper off using 2 per day for the next 2 weeks and then 1 per day for the 2 weeks after that and then stopping. She will continue gabapentin and try to increase this as directed by the neurologist. She is given return precautions.

## 2015-03-26 NOTE — Assessment & Plan Note (Signed)
No abdominal pain at this time. Does note bloating and nausea. Likely related to her Ehlers-Danlos. Benign abdominal exam. She's been referred to GI that specializes in Ehlers-Danlos to evaluate this. She will follow-up with them. We will refill her Zofran. Given return precautions.

## 2015-04-11 ENCOUNTER — Ambulatory Visit: Payer: BLUE CROSS/BLUE SHIELD | Admitting: Sports Medicine

## 2015-04-14 ENCOUNTER — Other Ambulatory Visit: Payer: Self-pay | Admitting: Family Medicine

## 2015-04-14 NOTE — Telephone Encounter (Signed)
Reviewed chart.  She was evaluated by neurology and the recommended start gabapentin and decrease use of fioricet.  She was given 30 tablets on 03/24/15.  Should not be out of fioricet.  Does not need to be taking this on a regular basis.  This can causer her to have rebound headaches.

## 2015-04-14 NOTE — Telephone Encounter (Signed)
Please advise refill in Dr. Ellen Henri absence. Thanks.

## 2015-04-18 ENCOUNTER — Other Ambulatory Visit: Payer: Self-pay | Admitting: Family Medicine

## 2015-04-18 ENCOUNTER — Ambulatory Visit: Payer: BLUE CROSS/BLUE SHIELD | Admitting: Sports Medicine

## 2015-04-18 NOTE — Telephone Encounter (Signed)
Please advise on refill of Fioricet

## 2015-04-18 NOTE — Telephone Encounter (Signed)
Pt called back about a refill on the butalbital-acetaminophen-caffeine (FIORICET, ESGIC) 50-325-40 MG tablet. Pharmacy is [facility] [place], [place]. There is notes from Dr Caryl Bis about the medication. Pt states she did go and see the neurologist. Pt is still having headaches. Call pt @ [phone]. Thank you!

## 2015-04-18 NOTE — Telephone Encounter (Signed)
Please see if the patient is still taking this. Based on our last office visit she was supposed to taper off this medication. Thanks.

## 2015-04-18 NOTE — Telephone Encounter (Signed)
Spoke with patient and she stated that she tried tapering off this medication but she is taking at least one a day due to her going through withdrawal when she tries to cut it out completely. She stated that she is now taking more excedrin and ibuprofen. She only has 5 pills left.

## 2015-04-19 NOTE — Telephone Encounter (Signed)
Refill given. Will need to discuss at her next office visit.

## 2015-04-20 NOTE — Telephone Encounter (Signed)
RX faxed to [facility].  

## 2015-04-24 ENCOUNTER — Encounter: Payer: Self-pay | Admitting: Family Medicine

## 2015-04-24 ENCOUNTER — Ambulatory Visit (INDEPENDENT_AMBULATORY_CARE_PROVIDER_SITE_OTHER): Payer: BLUE CROSS/BLUE SHIELD | Admitting: Family Medicine

## 2015-04-24 VITALS — BP 98/64 | HR 73 | Temp 98.3°F | Ht 64.0 in | Wt 99.4 lb

## 2015-04-24 DIAGNOSIS — G8929 Other chronic pain: Secondary | ICD-10-CM | POA: Diagnosis not present

## 2015-04-24 DIAGNOSIS — G43719 Chronic migraine without aura, intractable, without status migrainosus: Secondary | ICD-10-CM

## 2015-04-24 DIAGNOSIS — R109 Unspecified abdominal pain: Secondary | ICD-10-CM | POA: Diagnosis not present

## 2015-04-24 DIAGNOSIS — F32A Depression, unspecified: Secondary | ICD-10-CM

## 2015-04-24 DIAGNOSIS — F329 Major depressive disorder, single episode, unspecified: Secondary | ICD-10-CM

## 2015-04-24 NOTE — Patient Instructions (Signed)
Nice to see you.  Please follow-up with your GI physician's office on your test results. Please call your neurologist to let them know you have stopped the gabapentin and to obtain further recommendations for headaches. We will refer you to a therapist and a psychiatrist. Please keep your appointment with Dr. Oneida Alar. If you develop worsening headaches, abdominal pain, vomiting, numbness, weakness, vision changes, thoughts of harming herself or others, or any other change in symptoms please seek medical attention.

## 2015-04-24 NOTE — Assessment & Plan Note (Signed)
Worsened. Poorly controlled. She does have thoughts that she would better off dead as this would help relieve her pain, though she has no intent or plan to harm herself. No HI. Depression and anxiety may be affecting how she is experiencing her pain and given this and Given her inability to tolerate psychiatric medicines we will refer her to psychiatry. We will also refer her to a different therapist she feels her current therapist is not helping very much. She will continue Klonopin as needed. She is given return precautions.

## 2015-04-24 NOTE — Progress Notes (Signed)
Pre visit review using our clinic review tool, if applicable. No additional management support is needed unless otherwise documented below in the visit note. 

## 2015-04-24 NOTE — Assessment & Plan Note (Signed)
Headaches continue to be an issue. Likely related to her Chiari malformation and Ehlers-Danlos. Did not tolerate gabapentin. She has been taking Fioricet and decreased doses compared to previously. She is taking Excedrin again. I'm not quite sure what to do with her headaches given that she has been on a multitude of medications in the past for this including narcotics, muscle relaxers, and NSAIDs with no benefit. She did have some benefit with steroids, though this is not a long-term solution. Given that she has been on so many medications that have not worked I advised that she needed to follow-up with her neurologist for further management of this issue. She is given return precautions.

## 2015-04-24 NOTE — Assessment & Plan Note (Signed)
Unchanged from previously. Notes bloating and nausea. Suspect related to her Ehlers-Danlos. Benign abdominal exam today. She has seen GI to evaluate this and is awaiting results of recent studies. I advised her to call her GI physician to get these results so they can proceed with further workup. She will continue Zofran. She is given return precautions.

## 2015-04-24 NOTE — Progress Notes (Signed)
Patient ID: Belinda Bryant, female   DOB: 03/19/1980, 35 y.o.   MRN: [id number]  [name], MD Phone: [phone]  Belinda Bryant is a 35 y.o. female who presents today for follow-up.  Patient notes headaches remain present. Notes they can be 10 out of 10 in nature. She notes not much helps with these. She is taking Fioricet about once a day. She stopped taking the gabapentin as it made her too jittery and angry. She is taking more Excedrin. She notes some weakness in her legs though nothing focal. No numbness. She notes tramadol has helped some in the past though not much. She is awaiting a call back from neurology regarding further management of this issue.  Patient additionally notes she has seen GI since we last saw each other. They did a gastric emptying study last week of which she has not heard about the results. 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 She gave a 24-hour urine sample to measure arsenic sometime last week as well. They are considering doing capsule endoscopy versus EGD depending on the gastric emptying results. She notes significant bloating and nausea with this. Some upper abdominal discomfort that is chronic and unchanged. She notes she has not been able to eat much due to these issues. She is taking 2 Zofran a day. They've also given her medical food to supplement her current diet.  Patient additionally note she feels depressed with her current situation. She's been seeing a psychologist, though she does not feel he has been of much use. She's taking Klonopin 2 times a day. She has been getting more angry lately. She notes increasing anxiety as well. She notes thoughts of being better off not being around, though no intent or plan to harm herself. No HI. Has previously been on a number of psychiatric medicines of which she was unable to tolerate them.  PMH: Current smoker   ROS see history of present illness  Objective  Physical  Exam Filed Vitals:   04/24/15 1358  BP: 98/64  Pulse: 73  Temp: 98.3 F (36.8 C)    BP Readings from Last 3 Encounters:  04/24/15 98/64  03/24/15 102/70  03/14/15 111/76   Wt Readings from Last 3 Encounters:  04/24/15 99 lb 6.4 oz (45.088 kg)  03/24/15 98 lb 3.2 oz (44.543 kg)  03/13/15 99 lb (44.906 kg)    Physical Exam  Constitutional: No distress.  HENT:  Head: Normocephalic and atraumatic.  Mouth/Throat: Oropharynx is clear and moist. No oropharyngeal exudate.  Eyes: Conjunctivae are normal. Pupils are equal, round, and reactive to light.  Cardiovascular: Normal rate, regular rhythm and normal heart sounds.  Exam reveals no gallop and no friction rub.   No murmur heard. Pulmonary/Chest: Effort normal and breath sounds normal. No respiratory distress. She has no wheezes. She has no rales.  Abdominal: Soft. Bowel sounds are normal. She exhibits no distension. There is no tenderness. There is no rebound and no guarding.  Musculoskeletal: She exhibits no edema.  Neurological: She is alert.  CN 2-12 intact, 4+/5 strength bilateral hamstrings, 5/5 strength in bilateral biceps, triceps, grip, quads, plantar and dorsiflexion, sensation to light touch intact in bilateral UE and LE, normal gait, 2+ patellar reflexes  Skin: Skin is warm and dry. She is not diaphoretic.  Psychiatric:  Mood depressed, affect depressed and intermittently tearful     Assessment/Plan: Please see individual problem list.  Depression Worsened. Poorly controlled. She does have thoughts that  she would better off dead as this would help relieve her pain, though she has no intent or plan to harm herself. No HI. Depression and anxiety may be affecting how she is experiencing her pain and given this and Given her inability to tolerate psychiatric medicines we will refer her to psychiatry. We will also refer her to a different therapist she feels her current therapist is not helping very much. She will continue  Klonopin as needed. She is given return precautions.  Chronic abdominal pain Unchanged from previously. Notes bloating and nausea. Suspect related to her Ehlers-Danlos. Benign abdominal exam today. She has seen GI to evaluate this and is awaiting results of recent studies. I advised her to call her GI physician to get these results so they can proceed with further workup. She will continue Zofran. She is given return precautions.  Intractable chronic migraine without aura and without status migrainosus Headaches continue to be an issue. Likely related to her Chiari malformation and Ehlers-Danlos. Did not tolerate gabapentin. She has been taking Fioricet and decreased doses compared to previously. She is taking Excedrin again. I'm not quite sure what to do with her headaches given that she has been on a multitude of medications in the past for this including narcotics, muscle relaxers, and NSAIDs with no benefit. She did have some benefit with steroids, though this is not a long-term solution. Given that she has been on so many medications that have not worked I advised that she needed to follow-up with her neurologist for further management of this issue. She is given return precautions.    Orders Placed This Encounter  Procedures  . Ambulatory referral to Psychiatry    Referral Priority:  Routine    Referral Type:  Psychiatric    Referral Reason:  Specialty Services Required    Requested Specialty:  Psychiatry    Number of Visits Requested:  1  . Ambulatory referral to Psychology    Referral Priority:  Routine    Referral Type:  Psychiatric    Referral Reason:  Specialty Services Required    Requested Specialty:  Psychology    Number of Visits Requested:  [place], MD [place]

## 2015-04-26 ENCOUNTER — Ambulatory Visit (INDEPENDENT_AMBULATORY_CARE_PROVIDER_SITE_OTHER): Payer: BLUE CROSS/BLUE SHIELD | Admitting: Sports Medicine

## 2015-04-26 ENCOUNTER — Encounter: Payer: Self-pay | Admitting: Sports Medicine

## 2015-04-26 VITALS — BP 110/70 | HR 78 | Ht 64.0 in | Wt 99.0 lb

## 2015-04-26 DIAGNOSIS — G935 Compression of brain: Secondary | ICD-10-CM

## 2015-04-26 DIAGNOSIS — R29898 Other symptoms and signs involving the musculoskeletal system: Secondary | ICD-10-CM

## 2015-04-26 DIAGNOSIS — R519 Headache, unspecified: Secondary | ICD-10-CM

## 2015-04-26 DIAGNOSIS — Q796 Ehlers-Danlos syndrome, unspecified: Secondary | ICD-10-CM

## 2015-04-26 DIAGNOSIS — G43719 Chronic migraine without aura, intractable, without status migrainosus: Secondary | ICD-10-CM

## 2015-04-26 DIAGNOSIS — R51 Headache: Secondary | ICD-10-CM

## 2015-04-26 DIAGNOSIS — G8929 Other chronic pain: Secondary | ICD-10-CM

## 2015-04-26 MED ORDER — NORTRIPTYLINE HCL 10 MG PO CAPS: 10.0000 mg | ORAL_CAPSULE | Freq: Three times a day (TID) | ORAL | Status: DC

## 2015-04-26 MED ORDER — NORTRIPTYLINE HCL 10 MG PO CAPS: 10.0000 mg | ORAL_CAPSULE | Freq: Every day | ORAL | Status: DC

## 2015-04-26 NOTE — Patient Instructions (Addendum)
-   Wear neck brace 15 minutes at a time. - Continue cervical traction exercises. - Continue easy isometric exercises - Stop Cyclobenzaprine/ Tramadol and start Nortriptyline 10mg .    Start Nortriptyline with 1 capsule daily.  If you tolerate this well, you may increase up to 3 times daily. - Straight leg raises for your right leg - Wear compression sleeve on your knee - You should perform aerobic activity at least 10-15 minutes daily (the aquatics PT sounds great)

## 2015-04-26 NOTE — Assessment & Plan Note (Signed)
See my notes   She is getting more doctors to help manage her complex situation  I strongly agree with getting her into some activity and cont. Home exercises I gave her

## 2015-04-26 NOTE — Progress Notes (Signed)
   Subjective:    Patient ID: Belinda Bryant, female    DOB: September 15, 1980, 35 y.o.   MRN: [id number]  HPI Patient presents to [facility] for headache.  Patient notes that she has been doing worse since last visit.  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 She notes that she has about a 28mm increase in her herniation, for a total of 39mm now.  She has now found a Gyn (Dr Delfin Gant, [phone]) and a GI (Dr Gus Height at [facility], [phone]) provider that worse closely with patient with [name].  Patient went to [facility] and saw [name] [phone]) and she was placed on Neurontin 100mg , she discontinued medication because of excessive sedation.  Also recommended to discontinue Fioricet.  Headaches are daily and start in the occipital area.  Headaches radiate to eyes and neck.  She has been using a soft collar neck brace at night.  If she wears it during the day she gets lots of popping of her neck.  She does not plan on continuing with this neurologist.    Patient notes that her Right knee has been feeling like it is clicking out of place with certain movements.  This is intermittent.   She also notes that she was also referred to an aquatics PT in [place] but patient has not started yet.        Soc Hx:  Lives with partner who is helpful Unable to work 2/2 fatigue  Review of Systems Excessive somnolence Daily fatigue Shoulder pop with some of home exercises    Objective:   Physical Exam Blood pressure 110/70, pulse 78, height 5\' 4"  (1.626 m), weight 99 lb (44.906 kg). Gen: awake, alert, wearing sunglasses Pulm: breathing normally on room air MSK: FROM, no trapezius spasm. 5/5 strength UE. Hyperextension noted in bilateral elbows.  Scapular protraction present. Right knee click appreciated on exam, mild hyperextension of R knee appreciated, no joint effusion or erythema. Neuro: follows commands/ normal strength C5 - T1 No sensory changes in arms/ hands  She  still gets relief from some upward traction on neck    Assessment & Plan:   1. Ehlers-Danlos disease.  Headache does not appear to be coming from laxity in the cervical spine/ nerve irritation.  Had full strength in the UEs. 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  I want to try low dose nortriptyline to see if this helps. We need to get her into consistent low degree of aerobic exercise to see if this lessens the fatigue.  2. Arnold-Chiari malformation, type I ([place]). Concern that her headache may be a result of this. - Recommend that patient establish with neurology for continued management of this  3. Chronic nonintractable headache, unspecified headache type. Likely related to Arnold-Chiari.  - Wear neck brace no more than 15 minutes at a time. - Continue cervical traction exercises. - Continue easy isometric exercises - Stop Flexeril/ Tramadol since this is not helping. - Nortriptyline 10mg  TID.  Start with 1 capsule daily.  May increase up to TID if tolerated.  4. Knee clicking. 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   - Compression sleeve provided today for Right knee - Straight leg raises recommended - Perform aerobic activity at least 10-15 minutes daily (aquatics PT sounds great)

## 2015-04-26 NOTE — Assessment & Plan Note (Signed)
See visit note and concern about changes

## 2015-04-26 NOTE — Assessment & Plan Note (Signed)
Best response is to fioricet or excedrin  Not sure this is triggered by EDS/  ? Any issue with Chiari 1??  notriptyline 10 tid to see if lessen pain

## 2015-04-27 ENCOUNTER — Ambulatory Visit: Payer: BLUE CROSS/BLUE SHIELD | Admitting: Neurology

## 2015-05-03 ENCOUNTER — Ambulatory Visit (INDEPENDENT_AMBULATORY_CARE_PROVIDER_SITE_OTHER): Payer: BLUE CROSS/BLUE SHIELD | Admitting: Licensed Clinical Social Worker

## 2015-05-03 ENCOUNTER — Encounter: Payer: Self-pay | Admitting: Licensed Clinical Social Worker

## 2015-05-03 DIAGNOSIS — F1220 Cannabis dependence, uncomplicated: Secondary | ICD-10-CM

## 2015-05-03 DIAGNOSIS — F332 Major depressive disorder, recurrent severe without psychotic features: Secondary | ICD-10-CM | POA: Diagnosis not present

## 2015-05-03 NOTE — Progress Notes (Addendum)
Comprehensive Clinical Assessment (CCA) Note  05/03/2015 Belinda Bryant [id number]  Visit Diagnosis:      ICD-9-CM ICD-10-CM   1. Severe episode of recurrent major depressive disorder, without psychotic features ([place]) 296.33 F33.2   2. Cannabis use disorder, moderate, dependence (HCC) 304.30 F12.20       CCA Part One  Part One has been completed on paper by the patient.  (See scanned document in Chart Review)  CCA Part Two A  Intake/Chief Complaint:  CCA Intake With Chief Complaint CCA Part Two Date: 05/03/15 CCA Part Two Time: 1135 Chief Complaint/Presenting Problem: She has depression and anger related to medical issues. She has had anxiety for a long time but it is worse because of the unknowns. Her medical issues include Ehlers-Darlos and Chiari Malformation. She has been battling sickness for most of her life but the realization of her diagnosis and what that means is leading to main stressor. She was diagnosed in the last couple of years.  Patients Currently Reported Symptoms/Problems: Last night she got really angry and led to headaches. She has many sensory disorders so she can't go outside, go long distances so she doesn't have somebody to take her anywhere. The more she lies around the more she hurts. But she can't physically do things which leads to pain. She ends up doing too much. Her mom is supposed to take care of her but she is doing a terrible job. One thing yesterday with mom set her off. She does not want to live but does not want to kill herself. She is shaky, her vision is not clear, body aches, feels weak which are her typical symptoms. She was tearful discussing symptoms. This is why she has tried a pain therapist.   Collateral Involvement: She wants her mom to come but she doesn't think she would. Husband travels and does not live around here. Her sister is her biggest support but in [place].  Individual's Strengths: She used to be the backbone to her family  prior to medical issues. She cares a lot with people. She worked with people with disabilities. She is a helper, a lover, loves music.  Individual's Preferences: Therapist Individual's Abilities: At this point she can't identify because she is not physically able. In the past she worked with people with disabilities, she was a Librarian, academic, she had a lot of motivation and passion for helping people.  Type of Services Patient Feels Are Needed: Therapy, medical doctor will continue to manage her medications as he is managing psychiatric with medical issues.  Initial Clinical Notes/Concerns: Prior to medical diagnosis, nobody believed her with medical issues because she was too young to have so many symptoms. Medical doctors thought she had depression and put her on medications. She started on psychiatric medications in 2005. 2015 she finally found a new doctor who started process of referrals instead of medications. She saw a therapist two times a week, then once a week for six months and she ruled out that psychiatric issues were not causing issues. She worked with her on issues such as miscarriages. She had to start over a couple months ago when she had to get a new primary care  because she moved to [place]. She thinks that the medical is causing depression as there is no cure and won't get better. She started to pain management therapist in January. It isn't helping so wants to try something new.    Mental Health Symptoms Depression:  Depression: Change in energy/activity, Fatigue, Difficulty  Concentrating, Hopelessness, Increase/decrease in appetite, Irritability, Sleep (too much or little), Tearfulness, Weight gain/loss (headaches are a factor. No appetite because sick. On powdered food. She doesn't feel worthless but feels a burden. No past SA, denies SIB but describes "deferred pain", when has a headache she hits leg. She says that she has difficulty tolerating psychiatric medications)  Mania:  Mania:  N/A  Anxiety:   Anxiety: Difficulty concentrating, Fatigue, Irritability, Restlessness, Sleep, Worrying, Tension  Psychosis:  Psychosis: N/A  Trauma:   N/A  Obsessions:  Obsessions:   (OCD cleaner. She obsesses about room being clean or things being organized but can't do anything about it now with medical issues. )    Compulsions:  Compulsions: In the past she had to make sure that the house was clean and organized.    Inattention:  Inattention: N/A  Hyperactivity/Impulsivity:  Hyperactivity/Impulsivity: N/A  Oppositional/Defiant Behaviors:  Oppositional/Defiant Behaviors: N/A  Borderline Personality:  Emotional Irregularity: N/A  Other Mood/Personality Symptoms:   does not endorse long term symptoms   Mental Status Exam Appearance and self-care  Stature:  Stature: Average  Weight:  Weight: Thin  Clothing:  Clothing: Casual  Grooming:  Grooming: Normal  Cosmetic use:  Cosmetic Use: None  Posture/gait:  Posture/Gait: Normal  Motor activity:  Motor Activity: Not Remarkable  Sensorium  Attention:  Attention: Normal  Concentration:  Concentration: Normal  Orientation:  Orientation: Object, Person, Place, Situation, Time  Recall/memory:  Recall/Memory: Normal  Affect and Mood  Affect:  Affect: Depressed  Mood:  Mood: Depressed  Relating  Eye contact:  Eye Contact: Normal  Facial expression:  Facial Expression: Constricted  Attitude toward examiner:  Attitude Toward Examiner: Cooperative  Thought and Language  Speech flow:    Thought content:  Thought Content: Appropriate to mood and circumstances  Preoccupation:     Hallucinations:     Organization:     Transport planner of Knowledge:  Fund of Knowledge: Average  Intelligence:  Intelligence: Average  Abstraction:  Abstraction: Normal  Judgement:     Reality Testing:  Reality Testing: Adequate  Insight:  Insight: Fair  Decision Making:     Social Functioning  Social Maturity:  Social Maturity: Self-centered  Social  Judgement:  Social Judgement: Normal  Stress  Stressors:  Stressors: Illness, Money, Transitions, Work  Coping Ability:  Coping Ability: Overwhelmed, Deficient supports  Skill Deficits:     Supports:      Family and Psychosocial History: Family history Number of Years Married:  (Married in December but been together for 15 years. ) What types of issues is patient dealing with in the relationship?: none Are you sexually active?: No What is your sexual orientation?: heterosexual Has your sexual activity been affected by drugs, alcohol, medication, or emotional stress?: physical illness-pelvic prolaspe so painful.  Does patient have children?: No (two miscarriages)  Childhood History:  Childhood History By whom was/is the patient raised?: Mother Additional childhood history information: It was nothing bad. They moved a lot. Her dad left when she was [age]. there was 15 years without contact. Last year he came back into her lives but only talked to him a few times since then.  Description of patient's relationship with caregiver when they were a child: She wasn't around a lot. She learned to stay by herself since young.  Patient's description of current relationship with people who raised him/her: Her dad not much a fixture in her life and just moved back in with mom since June. She moved  her to have her mom take care of her. She left support in [place]. Conflict with mom because she is trying to understand but unable to.  How were you disciplined when you got in trouble as a child/adolescent?: She wasn't bad. She had time outs and grounding.  Does patient have siblings?: Yes Number of Siblings: 4 Description of patient's current relationship with siblings: Besides her half-brother and doesn't know him, he is 81, has three sisters one older(38) and two younger(20, 25). Great relationship.  Did patient suffer any verbal/emotional/physical/sexual abuse as a child?: No Has patient ever been  sexually abused/assaulted/raped as an adolescent or adult?: No Was the patient ever a victim of a crime or a disaster?: No Witnessed domestic violence?: No Has patient been effected by domestic violence as an adult?: No  CCA Part Two B  Employment/Work Situation: Employment / Work Copywriter, advertising Employment situation: Unemployed (She is not working due to medical issues) What is the longest time patient has a held a job?: 7 years Where was the patient employed at that time?: [employer] Has patient ever been in the [employer]?: No Has patient ever served in combat?: No Did You Receive Any Psychiatric Treatment/Services While in Passenger transport manager?: No Are There Guns or Other Weapons in [place]?: No  Education: Education Last Grade Completed: 16 Name of [place]: [place] Did Teacher, adult education From [employer]?: Yes Did Physicist, medical?: Yes What Type of College Degree Do you Have?: B.A Psychology Did Heritage manager?: No Did You Have Any Special Interests In School?: psychology Did You Have An Individualized Education Program (IIEP): No Did You Have Any Difficulty At School?: No  Religion: Religion/Spirituality Are You A Religious Person?: Yes What is Your Religious Affiliation?: None How Might This Affect Treatment?: no unless somebody was against  Leisure/Recreation: Leisure / Recreation Leisure and [place]: none currently. She used to love to color  Exercise/Diet: Exercise/Diet Do You Exercise?: Yes What Type of Exercise Do You Do?: Other (Comment) (stretches) How Many Times a Week Do You Exercise?: 4-5 times a week Have You Gained or Lost A Significant Amount of Weight in the Past Six Months?:  (in past few years-15, in last 6 months 5 lbs. She is around 97-100 LB) Do You Follow a Special Diet?: Yes (Has to do a low hystomine diet. She has issues with preservatives and dyes. She is allergic to a lot of foods so she has to figure out what she can eat. Right now it  is mostly a liquid diet) Do You Have Any Trouble Sleeping?: Yes (She sleeps a lot. She doesn't consistently stay asleep. )  CCA Part Two C  Alcohol/Drug Use: Alcohol / Drug Use Pain Medications: none Prescriptions: See med list Over the Counter: see med list.  History of alcohol / drug use?: Yes (Taking CBD oil and it is legal. It doesn't have the [place]. It is more of pain management. She takes it daily) Substance #1 Name of Substance 1: marijuana 1 - Age of First Use: 18 1 - Amount (size/oz): gram a week 1 - Frequency: most every day 1 - Duration: Every day for last couple of years.  1 - Last Use / Amount: 05/03/15 3 bowl hits      Pa              CCA Part Three  ASAM's:  Six Dimensions of Multidimensional Assessment  Dimension 1:  Acute Intoxication and/or Withdrawal Potential:  Dimension 1:  Comments: no  signs symptoms of withdrawal  Dimension 2:  Biomedical Conditions and Complications:  Dimension 2:  Comments: Severe medical problems are present but stable. Poor ability to cope with physical problems  Dimension 3:  Emotional, Behavioral, or Cognitive Conditions and Complications:  Dimension 3:  Comments: Patient has depression, anxiety and anger that are persistent that distract from recovery efforts but are not immediate threat to safety and do not prevent independent functioning  Dimension 4:  Readiness to Change:  Dimension 4:  Comments: No motivation to change. She is in precontemplative stage  Dimension 5:  Relapse, Continued use, or Continued Problem Potential:  Dimension 5:  Comments: Little understanding of relapse issues, but she is able to self-manage  Dimension 6:  Recovery/Living Environment:  Dimension 6:  Recovery/Living Environment Comments: Patient reports environment is not supportive but with clinical structure she is able to cope most of the time.    Substance use Disorder (SUD) Substance Use Disorder (SUD)  Checklist Symptoms of Substance Use: Evidence  of tolerance  Social Function:  Social Functioning Social Maturity: Self-centered Social Judgement: Normal  Stress:  Stress Stressors: Illness, Money, Transitions, Work Coping Ability: Overwhelmed, Deficient supports Patient Takes Medications The Way The Doctor Instructed?: Yes Priority Risk: Low Acuity  Risk Assessment- Self-Harm Potential: Risk Assessment For Self-Harm Potential Thoughts of Self-Harm:  (passive suicidal but no current thoughts to harm herself. No plan or intent) Method: No plan Availability of Means: No access/NA  Risk Assessment -Dangerous to Others Potential: Risk Assessment For Dangerous to Others Potential Method: No Plan Availability of Means: No access or NA Notification Required: No need or identified person  DSM5 Diagnoses: Patient Active Problem List   Diagnosis Date Noted  . Major depressive disorder, recurrent episode, severe ([place]) 05/03/2015  . Cannabis use disorder, moderate, dependence ([place]) 05/03/2015  . Ovarian cyst 03/26/2015  . Dizziness 03/14/2015  . Intractable headache 03/13/2015  . Hematuria 01/31/2015  . Depression 01/17/2015  . Sweating 01/17/2015  . Abnormality of gait 12/29/2014  . Intractable chronic migraine without aura and without status migrainosus 12/27/2014  . Arnold-Chiari malformation, type I ([place]) 12/27/2014  . Allergic rhinitis 12/07/2014  . Cough 12/07/2014  . Vitamin D deficiency 12/07/2014  . Anxiety 12/07/2014  . Headache 11/24/2014  . Chronic abdominal pain 11/24/2014  . Ehlers-Danlos disease 11/24/2014  . Palpitations 11/24/2014  . Muscle twitching 11/23/2014    Patient Centered Plan: Patient is on the following Treatment Plan(s):  Anxiety and Depression, anger. Therapist will complete treatment plan with patient during next session.   Recommendations for Services/Supports/Treatments: Therapist recommended patient to individual therapy weekly. Patient feels that her doctor who is managing her medical  issues would best manage medications. Therapist will need to discuss with patient his note recommending psychiatric services related to inability to tolerate medications and worsening symptoms.     Treatment Plan Summary: Patient is a 35 year old married female who reports depression, anxiety and anger issues related to medical issues Ehlers-Darlos and Chiari Malformation. She has been battling sickness for most of her life but the realization of her diagnosis and what that means is leading to main stressor. She relates that there is not a cure for her medical diagnosis but it is something she has to manage. She was diagnosed in the last couple of years. She is shaky, her vision is not clear, body aches, feels weak which are her typical symptoms. She also gets headaches and not much has helped with these. She has chronic abdominal pain.  Her depression has worsened. She does have thoughts that she would better off dead as this would help relieve her pain, though she has no intent or plan to harm herself. Her depressive symptoms include change in energy/activity, fatigue, difficulty concentrating, hopelessness, decrease in appetite, irritability, sleeps a lot but has trouble staying asleep, tearfulness, weight loss (headaches are a factor in depression), no appetite because sick and she says she feels like she is a burden. She reports she has had anxiety for a long time but it has worsened because of the unknowns. She has difficulty concentrating, fatigue, irritability, restlessness, problems with sleep, worrying, tension. no HI. She relates that medical issues lead to depression, anxiety and anger and these mental health symptoms she believes affect her medical issues and how she experiences pain. She also relates that she has difficulty tolerating psychiatric medications. She feels that her current therapist is not helping much so is looking for a therapeutic approach that will be more helpful. Prior to medical  diagnosis, nobody believed her with medical issues because she was too young to have so many symptoms. Medical doctors thought she had depression and put her on medications. She started on psychiatric medications in 2005. Patient relates taking CBD oil daily and smokes marijuana daily and amount is a gram a week. Patient would benefit from supportive interventions and coping strategies to manage both medical and mental health symptoms.     Referrals to Alternative Service(s): Referred to Alternative Service(s):   Place:   Date:   Time:    Referred to Alternative Service(s):   Place:   Date:   Time:    Referred to Alternative Service(s):   Place:   Date:   Time:    Referred to Alternative Service(s):   Place:   Date:   Time:     Bowman,Mary A

## 2015-05-04 ENCOUNTER — Telehealth: Payer: Self-pay | Admitting: Licensed Clinical Social Worker

## 2015-05-05 ENCOUNTER — Telehealth: Payer: Self-pay | Admitting: Family Medicine

## 2015-05-05 NOTE — Telephone Encounter (Signed)
Therapist discussed with patient Dr. Ellen Henri request for referral to psychiatrist and patient related she has an appointment set up with psychiatrist in this office in [place].

## 2015-05-05 NOTE — Telephone Encounter (Signed)
Pt mother-Kimberly called requesting an appt. Joelene Millin stated that the patient may have had a seizure last night and is currently having a bad headache, weakness, and is unable to eat anything. Spoke with Jamie-CMA to see if the patient could be worked in today. Jamie relayed message to Dr. Caryl Bis and he advised that the patient go to the ER. Anselm Jungling that Dr. Caryl Bis request for her to take patient to the [facility]

## 2015-05-10 ENCOUNTER — Ambulatory Visit (INDEPENDENT_AMBULATORY_CARE_PROVIDER_SITE_OTHER): Payer: BLUE CROSS/BLUE SHIELD | Admitting: Licensed Clinical Social Worker

## 2015-05-10 DIAGNOSIS — F332 Major depressive disorder, recurrent severe without psychotic features: Secondary | ICD-10-CM

## 2015-05-10 DIAGNOSIS — F1220 Cannabis dependence, uncomplicated: Secondary | ICD-10-CM | POA: Diagnosis not present

## 2015-05-10 NOTE — Progress Notes (Signed)
THERAPIST PROGRESS NOTE  Session Time: 2:05 PM- 2:55 PM  Participation Level: Active  Behavioral Response: CasualAlertEuthymic  Type of Therapy: Individual Therapy  Treatment Goals addressed: Anxiety, Coping and Diagnosis: MDD, severe, recurrent, challenge negative thought patterns, strategies to manage pain.   Interventions: CBT, DBT, Strength-based, Supportive, Reframing and Other: Solution Focused  Summary: Belinda Bryant is a 35 y.o. female who presents saying she has been struggling this last week. It started with a bad headache and got worse. She then go tremors on her right side. She called her neurologist and PCP Friday. Her speech had gotten slow. A couple times on Thursday and Friday she fell to the floor and not sure what is going on. Her doctors suggested that she go to ER but she went to ER two months ago for the same thing. She isn't sure if this is just her new norm. She decided to just handle it herself. Her husband thinks it could be that she is malnourished but more she ate the more problems she had with stomach. She went to gastroenterologist in March and she needs to go back to them. When she is eating she explains that she passes out and speech gets slurred. Her symptoms include tremors, weakness, headaches. At this point physical therapy has been helpful and she looks forward to physical therapist. She has found Dr. Oneida Alar in [place] who also knows about both diagnosis. Patient presents as extremely thin and therapist encouraged patient to contact gastroenterologist.    Therapist completed treatment plan with patient. The focus is on strategies to manage her emotions, specifically decrease in anger, tearfulness, and find ways to deal with negative thought patterns. Patient recognizes that emotions impact her physical problems so that she needs a plan to better manage. Therapist points out that it will be helpful to have a dual approach while both the medical and  emotional are being addressed. Patient has found Dr. Oneida Alar in [place] who is able to manage both. Patient said she needs to find the link that breaks the cycle. She cries daily and this causes more pain and getting upset can cause more tremor and her speech to be slower and the more pain causes her mental health to be worse. Therapist introduced mindfulness and patient believes in mind body connection. Discussed patient being upset with her mom who didn't take her to the ER. Discussed that sister and husband are supportive, sister here this week and better and she is going to sister's house. She belongs to a couple support grop onlines. Patient says she hasn't giving up finding ways to manage her medical issues. Recognizes that it is benefical to help talk about feelings and that managing issues is a dual process that involves both medical and mental health. l and a dual process. Let's look for what works right now physical thearpy, informaton on mindfulness.  Suicidal/Homicidal: No  Therapist Response: Therapist interventions focused on what is working for patient right now. Therapist used problem solving so patient finds the right doctors who can best manage her symptoms and give her the best quality of life. Therapist explored supports and encouraged patient to use as helpful coping strategy. Therapist introduced mindfulness as coping for feelings as well as pain as well as encouraging patient to continue to explore strategies that are effective. Therapist educated patient on managing emotions through expressing them and mindfulness.    Plan: Return again in 1 week.2.Read handout on mindfulness.  Diagnosis: Axis I: Major Depression, Recurrent severe,  Cannabis Use Disorder, Moderate    Axis II: none    Bowman,Mary A 05/10/2015

## 2015-05-18 ENCOUNTER — Ambulatory Visit: Payer: BLUE CROSS/BLUE SHIELD | Admitting: Family Medicine

## 2015-05-18 ENCOUNTER — Ambulatory Visit (INDEPENDENT_AMBULATORY_CARE_PROVIDER_SITE_OTHER): Payer: BLUE CROSS/BLUE SHIELD | Admitting: Licensed Clinical Social Worker

## 2015-05-18 DIAGNOSIS — F332 Major depressive disorder, recurrent severe without psychotic features: Secondary | ICD-10-CM

## 2015-05-18 DIAGNOSIS — F1220 Cannabis dependence, uncomplicated: Secondary | ICD-10-CM | POA: Diagnosis not present

## 2015-05-18 NOTE — Progress Notes (Signed)
   THERAPIST PROGRESS NOTE  Session Time: 2:00 PM-2:50 PM  Participation Level: Active  Behavioral Response: CasualAlertDysphoric  Type of Therapy: Individual Therapy  Treatment Goals addressed: Coping and Diagnosis: MDD  Interventions: DBT, Supportive and Other: coping strategies for stressors  Summary: Franchesca Kaleesha Kallal is a 35 y.o. female who related that she thinks she wants to spend time with her sister who is more supportive for her. Therapist pointed out that it helps to be in a supportive environment. Patients related there will be more positive activities and she will be around more people. Woke up feeling like "crud". Explained that her mom continues to be unsupportive. Anger is helping her to stay awake and get things done. Unsure how to tell her mom that she has not been helpful and wants to stay with sister. Plans to see the neurologist on 26. She continues to get spells, tremors and headaches. Appointment with sports medicine doctor on 22. The gastroenterologist  is one of the most important things for her. Describes this week as an up and down week. She tried to get out. She went to church by herself. They play live music and that is her thing. She prays to get the strength. Noretripyline not helping pain and headaches. She feels from this medication that her emotions are not her. She cried yesterday and explained that she is not her. Doesn't know if it is the pain, or the unknown for her medical issues. Discussed homework of reading about mindfulness. She understands living in the moment. It is hard to live in the moment because she has to continue to figure out how to get better. Her mind races with worries. Mindfulness exercise to focus on the breath helped her to sleep.  She knows there is mind gut connection and that mind is interfering with process. Patient related that she is open to exercises such as relaxation exercises to help with pain and anger issues.      Suicidal/Homicidal: No  Therapist Response: LCSW provided patient with ongoing emotional support and encouragement. Encouraged patient to engage in positive activities to increase positive mood that are in the range of activities that she can do. Encouraged patient on implementing coping strategies that work for her. Processed various strategies for dealing with stressors. Reviewed mindfulness and how it can be used to separate from thoughts and emotions so that we can detach from them and have control of them. Explained mindfulness as a pain management strategy.   Plan: Return again in 2 weeks.  Diagnosis: Axis I: Major Depression, Recurrent severe, Cannabis Use Disorder, Moderate    Axis II: n/a    Bowman,Mary A 05/18/2015 f

## 2015-05-30 ENCOUNTER — Encounter: Payer: Self-pay | Admitting: Sports Medicine

## 2015-05-30 ENCOUNTER — Other Ambulatory Visit: Payer: Self-pay | Admitting: Family Medicine

## 2015-05-30 ENCOUNTER — Ambulatory Visit (INDEPENDENT_AMBULATORY_CARE_PROVIDER_SITE_OTHER): Payer: BLUE CROSS/BLUE SHIELD | Admitting: Sports Medicine

## 2015-05-30 VITALS — BP 116/83 | HR 70 | Ht 64.0 in | Wt 97.0 lb

## 2015-05-30 DIAGNOSIS — G43719 Chronic migraine without aura, intractable, without status migrainosus: Secondary | ICD-10-CM

## 2015-05-30 DIAGNOSIS — Q796 Ehlers-Danlos syndrome, unspecified: Secondary | ICD-10-CM

## 2015-05-30 NOTE — Assessment & Plan Note (Signed)
We need neurology input to be sure no issue with chiari malfmx  Chronic pain may contribute to her depressed mood

## 2015-05-30 NOTE — Assessment & Plan Note (Signed)
I think a lot of her issues may have MSK origin Until she gets better control of neck issues and headaches cannot effectively add low stress aerobic activity  I suggested keeping up water PT

## 2015-05-30 NOTE — Telephone Encounter (Signed)
Rx faxed to pharmacy  

## 2015-05-30 NOTE — Telephone Encounter (Signed)
Last OV 3/17 ok to refill Fioricet?

## 2015-05-30 NOTE — Telephone Encounter (Signed)
Refill given. Please fax to pharmacy. 

## 2015-05-30 NOTE — Progress Notes (Signed)
Patient ID: Belinda Bryant, female   DOB: 1981-01-03, 35 y.o.   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 She is accompanied by sister with whom she will be living soon. Reports her poor sleep and severe headaches persist. Her anxiety is worse. Nortriptyline and gabapentin cause side effects without improvement in pain or sleep so she has stopped those. She has taken Cymbalta and Effexor previously and "many" other antidepressants, all of which destabilize mood and make her angry and/or shaky. Still takes Klonopin 1 mg daily as needed for anxiety. She takes Fioricet and Excedrin as needed for severe headaches. Neurologist had no additional recommendations at this time. She is scheduled to meet psychiatrist tomorrow. She is generally weak and tired, with increased discomfort to sensory stimuli such as bright lights and sounds. 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 Additionally, she and her sister are interested in other modalities of therapy such as OT.   Her right knee pain is better, but her neck and left elbow are popping more. She has established visits with aquatic therapy, which she is encouraged will help, but she is deconditioned and is not able to participate as much as she wants.   One of her primary concerns is being unable to eat and maintain weight. She is down to 95 lbs. She tolerates liquids and pureed consistency ok, but if she eats regular food she passes out and will sleep for hours after. Sister has to wake her up at the table. No nausea, vomiting or choking. She is established with GI has had several upper GI studies, including EGD two years ago and recent gastric emptying study which were normal.  She is hesitant to pursue any additional testing. She denies other episodes of syncope not related to eating.   ROS: As above, no  recent illness or other specific complaints.   PMH: Chronic abdominal pain and headache, fatigue, EDS, Chiari malformation, anxiety.  PE: BP 116/83 mmHg  Pulse 70  Ht 5\' 4"  (1.626 m)  Wt 97 lb (43.999 kg)  BMI 16.64 kg/m2  GEN: Thin, pale-appearing woman sitting on exam room table. NAD, pleasant and conversant.  CV/PULM: Nonlabored breathing, skin is warm, no edema, peripheral circulation grossly intact.  MSK: Subluxation of left medial triceps tendon over medial condyle with flexion/extension. Not observed on right.  Neck: holds in neutral position/ turns body rather than rotating No trapezius spasm noted  A/P:  1. Headaches. History of Chiari malformation and EDS. Headaches may be related to this pathology, either from Chiari directly or tension headache component given neck instability. Migraine headache component also possible. She will establish care with a new neurologist for second opinion. May continue Fioricet and Excedrin as needed since those help the most with fewest side effects.   Also contributing to pain is anxiety and poor nutrition and sleep. She has not tolerated typical sedating/pain/anxiety/depression medications. She will be meeting with psychiatrist tomorrow who may have medication recommendations.   2. Deconditioning, hypermobility. Continue gentle ROM strengthening exercises with aquatic therapy. Look into OT. Wear compression sleeve on left elbow to help tendon popping symptoms. Return to clinic here only needed with concerns, no specific follow up.  3. Difficulty swallowing/deglutition syncope? Able to tolerate liquids, but seems to gradually lose consciousness when trying to swallow food. As a result, she has lost weight and has had  digestive irregularities as well. Possibilities include features of dysautonomia, and/or vasovagal/carotid sinus stimulation with swallowing due to head or neck position given ligament instability. Postprandial hypotension unlikely given  she is still seated when symptoms occur, she does not take in a large amount of food, and gastric emptying study was normal. Larger amount of calories when liquid do not cause symptoms. Recommend she find a high calorie liquid/pureed diet she likes in an attempt to maintain at least 1200-1500 calories intake daily. Her BMR is 1200 calories. See if limiting the swallowing mechanics might relieve any neck/carotid sinus strain and limit her symptoms. Sister will check her pulse/BP at onset of future episodes. Pulse and BP are regular here.

## 2015-05-31 ENCOUNTER — Ambulatory Visit (INDEPENDENT_AMBULATORY_CARE_PROVIDER_SITE_OTHER): Payer: BLUE CROSS/BLUE SHIELD | Admitting: Licensed Clinical Social Worker

## 2015-05-31 ENCOUNTER — Encounter: Payer: Self-pay | Admitting: Psychiatry

## 2015-05-31 ENCOUNTER — Ambulatory Visit (INDEPENDENT_AMBULATORY_CARE_PROVIDER_SITE_OTHER): Payer: BLUE CROSS/BLUE SHIELD | Admitting: Psychiatry

## 2015-05-31 VITALS — BP 100/62 | HR 97 | Temp 97.2°F | Ht 64.0 in | Wt 99.2 lb

## 2015-05-31 DIAGNOSIS — F332 Major depressive disorder, recurrent severe without psychotic features: Secondary | ICD-10-CM

## 2015-05-31 DIAGNOSIS — N809 Endometriosis, unspecified: Secondary | ICD-10-CM | POA: Insufficient documentation

## 2015-05-31 DIAGNOSIS — F1220 Cannabis dependence, uncomplicated: Secondary | ICD-10-CM

## 2015-05-31 DIAGNOSIS — F331 Major depressive disorder, recurrent, moderate: Secondary | ICD-10-CM

## 2015-05-31 DIAGNOSIS — D219 Benign neoplasm of connective and other soft tissue, unspecified: Secondary | ICD-10-CM | POA: Insufficient documentation

## 2015-05-31 DIAGNOSIS — F5000 Anorexia nervosa, unspecified: Secondary | ICD-10-CM

## 2015-05-31 DIAGNOSIS — Z8759 Personal history of other complications of pregnancy, childbirth and the puerperium: Secondary | ICD-10-CM | POA: Insufficient documentation

## 2015-05-31 MED ORDER — VALPROATE SODIUM 250 MG/5ML PO SYRP: 250.0000 mg | ORAL_SOLUTION | Freq: Every day | ORAL | Status: AC

## 2015-05-31 NOTE — Progress Notes (Addendum)
   THERAPIST PROGRESS NOTE  Session Time: 11:05AM-11:50 AM  Participation Level: Active  Behavioral Response: CasualAlertDysphoric and tearful  Type of Therapy: Individual Therapy  Treatment Goals addressed: Coping and Diagnosis: MDD severe, recurrent, improvement of psychiatric symptoms, challenge negative thought patterns, elevate mood and return to effective level of functioning, learn to deal with pain in a better way.   Interventions: Solution Focused, Strength-based, Supportive and Other: coping strategies for depression  Summary: Belinda Bryant is a 35 y.o. female who presents with related that she went to her sisters last week. It was nice to just sit out on the porch and it was nice to relax. She has an appointment with gastroenterologist next Tuesday. She has not been able to eat. Dr. Lyla Glassing doctor) thinks she needs to do simple things to build strength. Due to neck instability can not have endoscopy. With Belinda Bryant a lot can go wrong with motility. Her doctor thinks that there could be something wrong with vagus vein. She is seeing the neurologist next week. Mom continues to be a trigger. She was at a carnival and ate food but got really sick afterwards. She is going back to sisters. Her sister is doing research and they are on a mission to find people to help her. She is depressed because she is getting sicker. Discussed pain management doctor because she reacts to so many medications. Pamelor has not worked and currently on Neurontin. She can't even take pain medicine to relieve the pain. She doesn't even want to go in that direction as she does not want to develop dependency. PT has been helpful. She was referred to Gastroenterologist because they specializes in EDS. She is going to see how her appointment goes with gastroenterologist and neurologist but also looking for specialist who can work on [employer] and EDS. She said that her medical issues require a  whole team of specialist. She is hoping to have maintenance with the right treatment. Summarized the session and patient said it helps to get things out in session. Patient does not want to give up in addressing her medical issues and therapist reinforced her positive approach.   Suicidal/Homicidal: No  Therapist Response: Therapist pointed out that medical is tied up with her mental health so she needs to address medical to feel better. Therapist pointed out that she needs to find somebody who specializes in EDS and who is going to actively work with her. Therapist patient with ongoing emotional support and encouragement. Reinforced the importance of client staying focused on her own strengths and taking a positive approach to find effective treatment for her medical issues. Provided positive feedback about patient taking a positive approach. Therapist encouraged patient to do what works and it was therapeutic for her to stay with sister so therapist encouraged her to stay with her and relax.    Plan: Return again in 2 weeks. 2.Patient research providers who specialize in Chiari Malformation and EDS  Diagnosis: Axis I: Major Depression, Recurrent severe,Cannabis Use Disorder, Moderate Dependence    Axis II: n/a    Belinda Bryant A 05/31/2015

## 2015-05-31 NOTE — Progress Notes (Signed)
Psychiatric Initial Adult Assessment   Patient Identification: Belinda Bryant MRN:  [id number] Date of Evaluation:  05/31/2015 Referral Source: Stanton Kidney- Therapist  Chief Complaint:   Chief Complaint    Establish Care; Anxiety; Other; Fatigue; Stress     Visit Diagnosis:    ICD-9-CM ICD-10-CM   1. 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 Cannabis use disorder, moderate, dependence (HCC) 304.30 F12.20   3. MDD (major depressive disorder), recurrent episode, moderate (HCC) 296.32 F33.1     History of Present Illness:    Patient is a 35 year old single white female with history of Ehlers-Danlos and Chiari  Malformation Presented for initial assessment. She was referred by her primary care physician. Patient reported that she has been diagnosed with congenital anomaly in 2016. However she has been following with [facility] genetics department for a long period of time since she was 35 years old as she has been trying to find a " diagnosis ". She stated that she does not remember the name of the physicians she saw over there. She has been seeing multiple physicians at this time. She is seeing a rheumatologist, neurologist as well as a primary care physician. She is recently seeing a therapist in our practice. She has seen 2 neurologists in the past week as well as one neurosurgeon. However she is unhappy with them as she does not like their diagnosis. She is currently living with her mother who is a Marine scientist but she does not like staying with her as she is trying to control her symptoms. She is planning to relocate to her sister in [place]. Patient has started doing her therapy in [place] and is enjoying the same. She reported that they are very meticulous and very supportive. She reported that she has been feeling a lot of symptoms due to her Ehlers-Danlos as she feels that her body is prolapsing including her uterus and rectum. However she never had any surgery done for the same. She stated that  she has been having neck instability and headaches on a regular basis.  She feels that she is unable to eat and has been losing weight. She has been on liquid diet and is only consuming proteinwater, protein shakes and tea. She is also trying to eat and has been only using Triscuits. She has lost a significant amount of weight. However she denied that she has been trying to lose weight. She was minimizing her weight loss. Patient is currently at 99 pounds. She is using marijuana on a regular basis and also uses CBD oil daily at night. Feels weak and tired and does not have any energy to do anything else. She reported that she needs help with other people for  transportation to her appointments.  Patient currently denied having any suicidal homicidal ideations or plans. She was minimizing her symptoms of eating disorder and reported that she is willing to have medications for the same.   Associated Signs/Symptoms: Depression Symptoms:  depressed mood, psychomotor retardation, fatigue, feelings of worthlessness/guilt, anxiety, weight loss, decreased appetite, (Hypo) Manic Symptoms:  Labiality of Mood, Anxiety Symptoms:  Excessive Worry, Psychotic Symptoms:  none PTSD Symptoms: Negative NA  Past Psychiatric History:  H/o anxiety and has been taking Klonopin 1 mg by mouth twice a day as prescribed by her primary care physician.She  that she has tried several psychotropic medications in the past. She denied any history of suicide attempts. Never admitted to a psychiatric hospital  Previous Psychotropic Medications: cymbalta effexor amitriptyline nortriptyline Gabapentin  lexapro Valium  ativan  Klonopin- 1mg  bid - taking for last 5 years  Substance Abuse History in the last 12 months:  Yes.    Consequences of Substance Abuse: also uses CBD oil in addition to daily use of MJ.   Past Medical History:  Past Medical History  Diagnosis Date  . Arthritis   . Chicken pox   .  Headache   . Chiari malformation   . GERD (gastroesophageal reflux disease)   . Allergic rhinitis   . Migraine   . UTI (lower urinary tract infection)   . Ehlers-Danlos disease   . Anxiety     Past Surgical History  Procedure Laterality Date  . Myomectomy    . Wisdom    . Dnc    . Cervical polypectomy      Family Psychiatric History:  Depression in sister.   Family History:  Family History  Problem Relation Age of Onset  . Arthritis      parents, gradnparents, siblings  . Lung cancer      grandparent  . Hypertension      parent  . Arthritis Mother   . Anxiety disorder Sister   . Depression Sister   . Arthritis Sister     Social History:   Social History   Social History  . Marital Status: Single    Spouse Name: N/A  . Number of Children: N/A  . Years of Education: N/A   Social History Main Topics  . Smoking status: Current Every Day Smoker -- 0.50 packs/day    Types: Cigarettes    Start date: 05/30/2000  . Smokeless tobacco: Never Used  . Alcohol Use: No  . Drug Use: Yes    Special: Marijuana     Comment: Marijuana - uses daily  . Sexual Activity: No   Other Topics Concern  . None   Social History Narrative   Pt lives with her mom. Lives on the second floor of an apt building, does have issues with the stairs. Pt has a bachelors degree    Additional Social History:  Married since December. Known him for the past 15 years, he has  Consulting civil engineer job.  She is living with her mother . She is planning to move out to her sister.   Allergies:   Allergies  Allergen Reactions  . Penicillins     When she was a child, was told don't take penicillin   . Doxycycline Rash    Agitation  . Tape Rash    Metabolic Disorder Labs: No results found for: HGBA1C, MPG No results found for: PROLACTIN No results found for: CHOL, TRIG, HDL, CHOLHDL, VLDL, LDLCALC   Current Medications: Current Outpatient Prescriptions  Medication Sig Dispense Refill  .  aspirin-acetaminophen-caffeine (EXCEDRIN MIGRAINE) 250-250-65 MG tablet Take 1 tablet by mouth every 6 (six) hours as needed for headache or migraine.     . butalbital-acetaminophen-caffeine (FIORICET, ESGIC) 50-325-40 MG tablet TAKE ONE TABLET BY MOUTH TWICE DAILY AS NEEDED FOR HEADACHE 60 tablet 0  . butalbital-aspirin-caffeine (FIORINAL) 50-325-40 MG capsule Take by mouth.    . chlorpheniramine (CHLOR-TRIMETON) 4 MG tablet Take 4 mg by mouth 2 (two) times daily as needed for allergies.    . Cholecalciferol (VITAMIN D3) 50000 UNITS CAPS Take 1 capsule by mouth once a week. 8 capsule 0  . clonazePAM (KLONOPIN) 1 MG tablet TAKE ONE TABLET BY MOUTH TWICE DAILY AS NEEDED FOR ANXIETY 180 tablet 0  . cyclobenzaprine (FLEXERIL) 10 MG tablet Take by  mouth.    . ibuprofen (ADVIL,MOTRIN) 600 MG tablet Take 600 mg by mouth.    . Iron TABS Take 60 mg by mouth. Reported on 05/03/2015    . Meclizine HCl 25 MG CHEW Chew by mouth.    . Multiple Vitamin (MULTI VITAMIN DAILY PO) Take by mouth.    . ondansetron (ZOFRAN-ODT) 4 MG disintegrating tablet Take 1 tablet (4 mg total) by mouth every 8 (eight) hours as needed for nausea or vomiting. 60 tablet 0  . phenyltoloxamine-acetaminophen 30-325 MG tablet Take 1 tablet by mouth every 4 (four) hours as needed for pain.    Marland Kitchen valproic acid (DEPAKENE) 250 MG/5ML syrup Take 5 mLs (250 mg total) by mouth at bedtime. 240 mL 12   No current facility-administered medications for this visit.    Neurologic: Headache: Yes Seizure: No Paresthesias:No  Musculoskeletal: Strength & Muscle Tone: decreased Gait & Station: normal Patient leans: N/A  Psychiatric Specialty Exam: ROS  Blood pressure 100/62, pulse 97, temperature 97.2 F (36.2 C), temperature source Tympanic, height 5\' 4"  (1.626 m), weight 99 lb 3.2 oz (44.997 kg), last menstrual period 05/24/2015, SpO2 98 %.Body mass index is 17.02 kg/(m^2).  General Appearance: Casual and Fairly Groomed  Eye Contact:  Fair   Speech:  Clear and Coherent  Volume:  Normal  Mood:  Anxious  Affect:  Congruent  Thought Process:  Coherent  Orientation:  Full (Time, Place, and Person)  Thought Content:  WDL  Suicidal Thoughts:  No  Homicidal Thoughts:  No  Memory:  Immediate;   Fair  Judgement:  Fair  Insight:  Lacking  Psychomotor Activity:  Normal  Concentration:  Fair  Recall:  [employer] of Knowledge:Fair  Language: Fair  Akathisia:  No  Handed:  Right  AIMS (if indicated):    Assets:  Communication Skills Desire for Improvement Social Support  ADL's:  Intact  Cognition: WNL  Sleep:      Treatment Plan Summary: Medication management    Discussed with patient about her medications. She reported that she has problems swallowing pill and is willing to take the liquid medication. I will start her on Depakene liquid 250 mg on a daily basis. She agreed with the plan  we discussed about the side effects in detail. She'll also continue on Klonopin as prescribed by her primary care physician. She will follow-up in 2 weeks or earlier She is also following with the therapist on a regular basis.   More than 50% of the time spent in psychoeducation, counseling and coordination of care.    This note was generated in part or whole with voice recognition software. Voice regonition is usually quite accurate but there are transcription errors that can and very often do occur. I apologize for any typographical errors that were not detected and corrected.    Rainey Pines, MD 4/19/20173:33 PM

## 2015-06-12 ENCOUNTER — Other Ambulatory Visit: Payer: Self-pay | Admitting: Family Medicine

## 2015-06-12 NOTE — Telephone Encounter (Signed)
RX faxed

## 2015-06-12 NOTE — Telephone Encounter (Signed)
Refill given. Please fax to pharmacy. 

## 2015-06-12 NOTE — Telephone Encounter (Signed)
Please advise on refill.

## 2015-06-14 ENCOUNTER — Ambulatory Visit: Payer: No Typology Code available for payment source | Admitting: Licensed Clinical Social Worker

## 2015-06-14 ENCOUNTER — Ambulatory Visit: Payer: No Typology Code available for payment source | Admitting: Psychiatry

## 2015-06-15 ENCOUNTER — Ambulatory Visit: Payer: BLUE CROSS/BLUE SHIELD | Admitting: Family Medicine

## 2015-06-26 ENCOUNTER — Ambulatory Visit: Payer: BLUE CROSS/BLUE SHIELD | Admitting: Family Medicine

## 2015-07-17 ENCOUNTER — Telehealth: Payer: Self-pay | Admitting: Family Medicine

## 2015-07-17 MED ORDER — BUTALBITAL-APAP-CAFFEINE 50-325-40 MG PO TABS: ORAL_TABLET | ORAL | Status: AC

## 2015-07-17 MED ORDER — ONDANSETRON 4 MG PO TBDP: 4.0000 mg | ORAL_TABLET | Freq: Three times a day (TID) | ORAL | Status: DC | PRN

## 2015-07-17 NOTE — Telephone Encounter (Signed)
Faxed

## 2015-07-17 NOTE — Telephone Encounter (Signed)
Pt called needing a refill for her medication butalbital-acetaminophen-caffeine (FIORICET, ESGIC) 50-325-40 MG tablet and ondansetron (ZOFRAN-ODT) 4 MG disintegrating tablet.   Pharmacy is [facility] [place].  Call pt @ [phone].

## 2015-07-17 NOTE — Telephone Encounter (Signed)
Please advise for refills. thanks 

## 2015-07-18 ENCOUNTER — Other Ambulatory Visit: Payer: Self-pay

## 2015-07-18 ENCOUNTER — Other Ambulatory Visit: Payer: Self-pay | Admitting: Family Medicine

## 2015-07-18 MED ORDER — ONDANSETRON 4 MG PO TBDP: 4.0000 mg | ORAL_TABLET | Freq: Three times a day (TID) | ORAL | Status: AC | PRN

## 2015-07-18 NOTE — Telephone Encounter (Signed)
Patient stated that [facility] did not receive Rx.

## 2015-07-18 NOTE — Telephone Encounter (Signed)
refaxed the [place] via fax machine.  Resending the Zofran. thanks

## 2015-07-26 ENCOUNTER — Telehealth: Payer: Self-pay | Admitting: Family Medicine

## 2015-07-26 NOTE — Telephone Encounter (Signed)
Pt called to let us know that she is in the hospital in [place] and will be having brain surgery in the next couple of weeks.. Please advise pt.Belinda Bryant

## 2015-07-26 NOTE — Telephone Encounter (Signed)
FYI

## 2015-07-27 NOTE — Telephone Encounter (Signed)
Noted  

## 2015-07-28 ENCOUNTER — Ambulatory Visit: Payer: BLUE CROSS/BLUE SHIELD | Admitting: Family Medicine

## 2015-09-01 ENCOUNTER — Telehealth: Payer: Self-pay | Admitting: Family Medicine

## 2015-09-01 NOTE — Telephone Encounter (Signed)
-----   Message from [place] sent at 09/01/2015  1:25 PM EDT ----- Dr. Lamona Curl, located in [place] would like to speak to you about Belinda Bryant. Date of birth [birth year]. I think she is trying to receive care from Dr. Nile Riggs. He said he is confused because she has you as a doctor in [place] and two other doctors in [place].  Please call his cell phone; [phone].

## 2015-09-01 NOTE — Telephone Encounter (Signed)
Spoke with pt she said she's currently staying in [place] where had the brain surgery. She said she has a follow up with gastro and neuro next week at [facility]. She said she's thinking about finding a PCP in [place] during her recovery.

## 2015-09-01 NOTE — Telephone Encounter (Signed)
Spoke with Dr Nile Riggs regarding the patient. 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 Discussed that I had not seen her in the last several months and that she has been in [place] at [facility]. Advised that she does have chronic headaches and had been on Fioricet for this though had not requested any chronic narcotics from me. He advised that he is 6-8 weeks out from being able to schedule a new patient appointment and noted that the patient's significant other came in stating that she would be out of her medicine in 7 days. I will have our office staff try to contact the patient to get a little bit more information regarding her current status and where she is seeking medical care.

## 2015-09-04 NOTE — Telephone Encounter (Signed)
Noted. Please let the patient know that if she does this she should let us know so we can fax any needed records.

## 2015-09-05 NOTE — Telephone Encounter (Signed)
Lm for patient to return call.

## 2015-11-13 ENCOUNTER — Ambulatory Visit: Payer: BC Managed Care – PPO | Attending: MEDICAL ONCOLOGY

## 2015-11-13 DIAGNOSIS — D509 Iron deficiency anemia, unspecified: Secondary | ICD-10-CM

## 2015-11-13 DIAGNOSIS — I82409 Acute embolism and thrombosis of unspecified deep veins of unspecified lower extremity: Secondary | ICD-10-CM | POA: Insufficient documentation

## 2015-11-13 DIAGNOSIS — D500 Iron deficiency anemia secondary to blood loss (chronic): Secondary | ICD-10-CM | POA: Insufficient documentation

## 2015-11-13 DIAGNOSIS — Q796 Ehlers-Danlos syndrome, unspecified: Secondary | ICD-10-CM

## 2015-11-13 DIAGNOSIS — G935 Compression of brain: Secondary | ICD-10-CM | POA: Insufficient documentation

## 2015-11-13 DIAGNOSIS — T82868A Thrombosis of vascular prosthetic devices, implants and grafts, initial encounter: Secondary | ICD-10-CM | POA: Insufficient documentation

## 2015-11-13 LAB — IRON TRANSFERRIN AND TIBC
IRON (TRANSFERRIN) SATURATION: 28 % (ref 16–50)
IRON: 76 ug/dL (ref 45–170)
TOTAL IRON BINDING CAPACITY: 269 ug/dL (ref 260–400)
TRANSFERRIN: 192 mg/dL (ref 180–360)

## 2015-11-13 LAB — CBC WITH DIFF
BASOPHIL #: 0.01 x10ˆ3/uL (ref 0.00–0.10)
BASOPHIL %: 0 % (ref 0–2)
EOSINOPHIL #: 0.08 x10ˆ3/uL (ref 0.00–0.40)
EOSINOPHIL %: 1 % (ref 0–6)
HCT: 35 % — ABNORMAL LOW (ref 37.0–47.0)
HGB: 11.6 g/dL — ABNORMAL LOW (ref 12.0–16.0)
LYMPHOCYTE #: 2.15 x10ˆ3/uL (ref 1.00–3.70)
LYMPHOCYTE %: 38 % (ref 26–54)
MCH: 29.9 pg (ref 24.0–32.0)
MCHC: 33.1 g/dL (ref 33.0–37.0)
MCV: 90.2 fL (ref 73.0–91.0)
MONOCYTE #: 0.38 x10ˆ3/uL (ref 0.00–2.00)
MONOCYTE %: 7 % (ref 0–10)
MONOCYTE %: 7 % (ref 0–10)
MPV: 11.3 fL — ABNORMAL HIGH (ref 7.4–10.4)
NEUTROPHIL #: 3.09 x10ˆ3/uL (ref 1.50–7.00)
NEUTROPHIL %: 54 % (ref 27–75)
PLATELETS: 287 x10?3/uL (ref 130–400)
PLATELETS: 287 x10ˆ3/uL (ref 130–400)
RBC: 3.88 x10ˆ6/uL — ABNORMAL LOW (ref 4.20–5.40)
RDW-CV: 21.3 FL
RDW-SD: 67 %
WBC: 5.7 x10ˆ3/uL (ref 4.8–10.8)

## 2015-11-13 LAB — FERRITIN: FERRITIN: 162 ng/mL (ref 10–200)

## 2015-11-13 LAB — D-DIMER: D-DIMER: 163 ng/mL DDU (ref <200)

## 2015-11-14 LAB — COMPREHENSIVE METABOLIC PANEL, NON-FASTING
ALBUMIN: 3.8 g/dL (ref 3.5–5.0)
ALKALINE PHOSPHATASE: 61 U/L (ref 38–126)
ALT (SGPT): 43 U/L (ref 9–52)
ANION GAP: 9 mmol/L — ABNORMAL LOW (ref 10–20)
AST (SGOT): 23 U/L (ref 14–36)
BILIRUBIN TOTAL: 0.1 mg/dL — ABNORMAL LOW (ref 0.3–1.3)
BUN/CREA RATIO: 25
BUN: 15 mg/dL (ref 8–25)
CALCIUM: 9.3 mg/dL (ref 8.5–10.1)
CHLORIDE: 108 mmol/L — ABNORMAL HIGH (ref 98–107)
CO2 TOTAL: 24 mmol/L (ref 22–32)
CREATININE: 0.6 mg/dL (ref 0.49–1.10)
ESTIMATED GFR: 114 mL/min/1.73mˆ2
GLUCOSE: 76 mg/dL (ref 65–139)
POTASSIUM: 4.1 mmol/L (ref 3.5–5.1)
PROTEIN TOTAL: 6.5 g/dL (ref 6.4–8.3)
SODIUM: 141 mmol/L (ref 137–145)

## 2016-02-26 IMAGING — MR MR HEAD WO/W CM
12 of 13 series · 40 of 48 positions shown · IV contrast (multihance)
Comparison: None.

CLINICAL DATA: Initial evaluation for 1 week history of increasing
headaches and nausea.

EXAM:
MRI HEAD WITHOUT AND WITH CONTRAST
MRA HEAD WITHOUT CONTRAST
MRA NECK WITHOUT AND WITH CONTRAST
TECHNIQUE: Multiplanar, multiecho pulse sequences of the brain and surrounding
structures were obtained without and with intravenous contrast.
Angiographic images of the Circle of Willis were obtained using MRA
technique without intravenous contrast. Angiographic images of the
neck were obtained using MRA technique without and with intravenous
contrast. Carotid stenosis measurements (when applicable) are
obtained utilizing NASCET criteria, using the distal internal
carotid diameter as the denominator.
CONTRAST:  8mL MULTIHANCE GADOBENATE DIMEGLUMINE 529 MG/ML IV SOLN

[Series 2: T1 · sagittal · 5.0mm · 0.45mm/px · 1 of 27 slices shown]
[im 1/27]
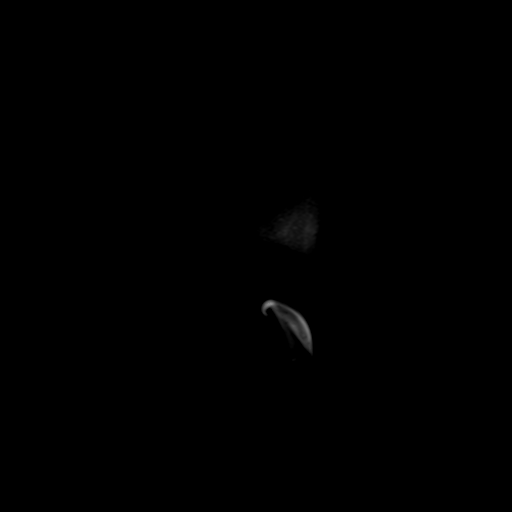

[Series 4: DWI · axial · 3.0mm · 1.80mm/px · z∈[-96,+60]mm · 5 of 52 slices shown (1 of 4)]
[im 1/52]
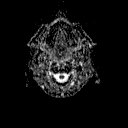
[im 13/52]
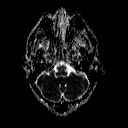
[im 26/52]
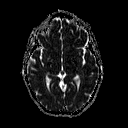
[im 39/52]
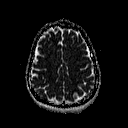
[im 52/52]
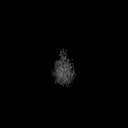

[Series 6: DWI · coronal · 3.0mm · 1.80mm/px · 5 of 46 slices shown (2 of 4)]
[im 1/46]
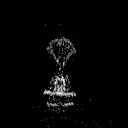
[im 12/46]
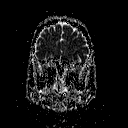
[im 23/46]
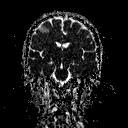
[im 34/46]
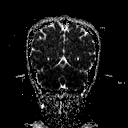
[im 46/46]
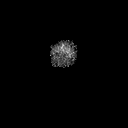

[Series 7: T2 · axial · 5.0mm · 0.60mm/px · z∈[-92,+64]mm · 2 of 25 slices shown (1 of 2)]
[im 1/25]
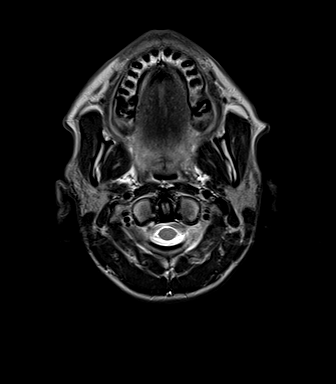
[im 25/25]
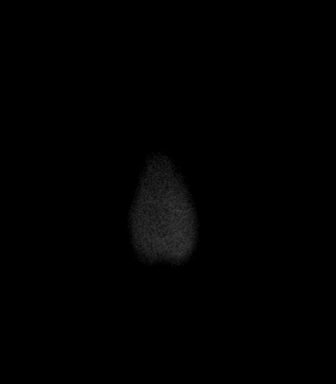

[Series 8: FLAIR · axial · 5.0mm · 0.45mm/px · z∈[-92,+64]mm · 2 of 25 slices shown (1 of 2)]
[im 1/25]
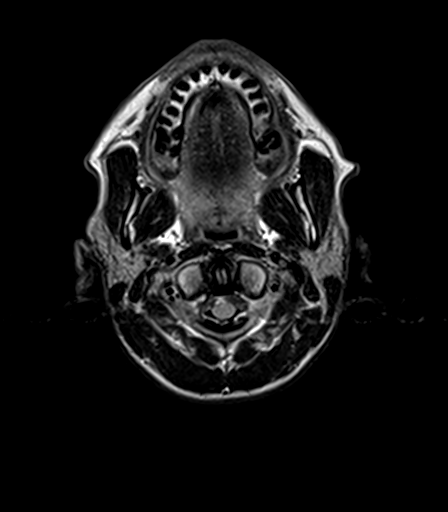
[im 25/25]
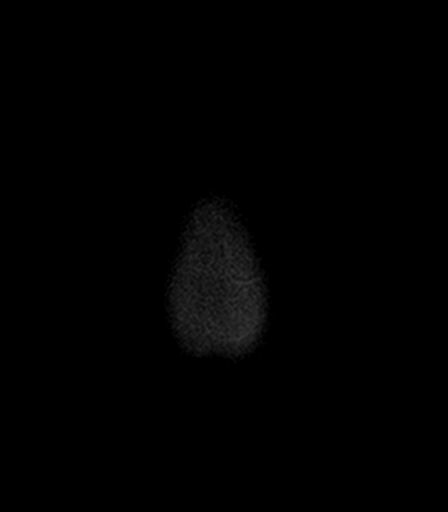

[Series 9: T2 · axial · 5.0mm · 0.45mm/px · z∈[-92,+64]mm · 2 of 25 slices shown (2 of 2)]
[im 1/25]
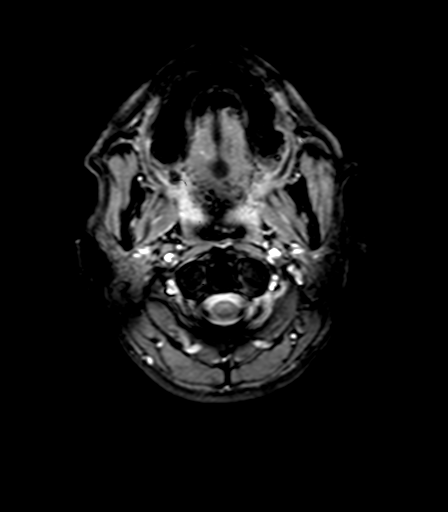
[im 25/25]
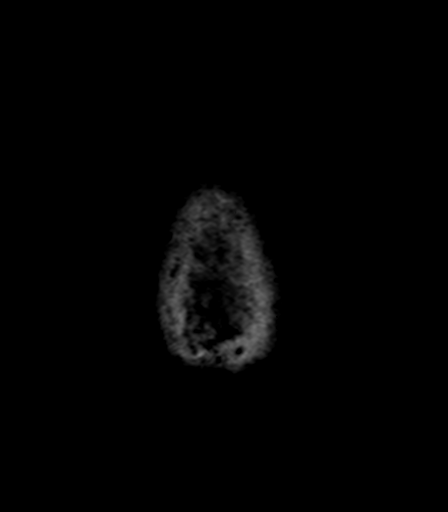

[Series 11: FLAIR · sagittal · 5.0mm · 0.43mm/px · 3 of 27 slices shown (2 of 2)]
[im 1/27]
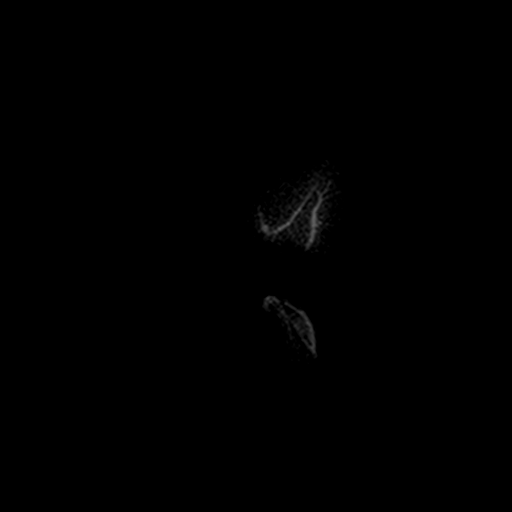
[im 14/27]
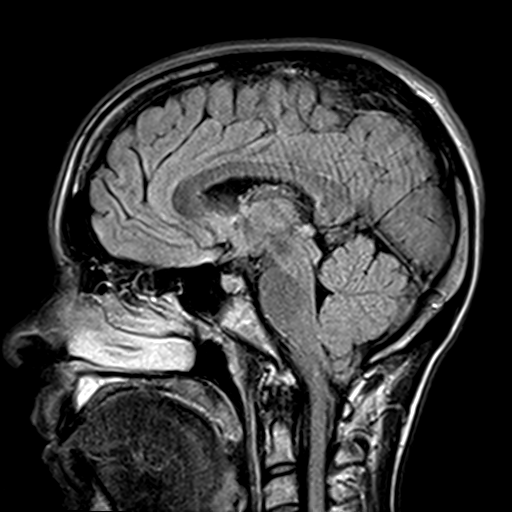
[im 27/27]
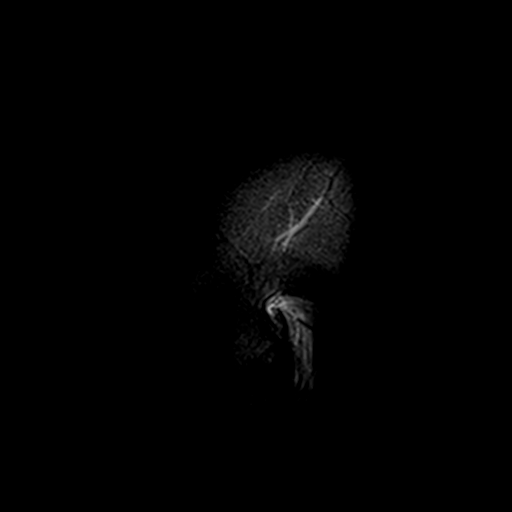

[Series 13: T2 post-contrast · coronal · 5.0mm · 0.49mm/px · 3 of 29 slices shown]
[im 1/29]
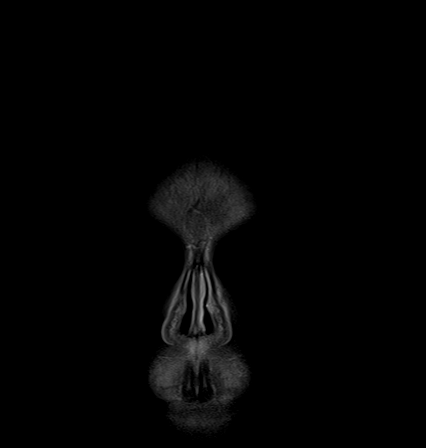
[im 15/29]
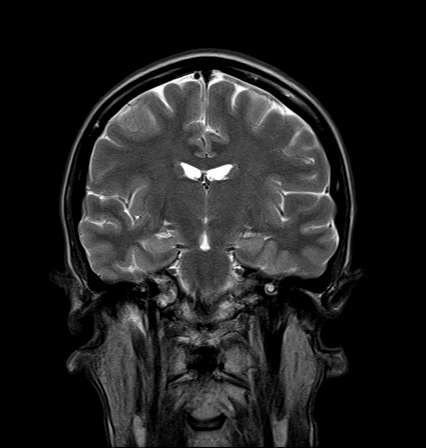
[im 29/29]
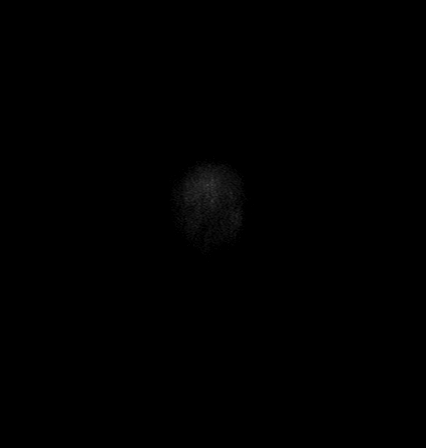

[Series 14: T1 post-contrast · axial · 3.0mm · 1.00mm/px · z∈[-93,+60]mm · 5 of 52 slices shown (1 of 2)]
[im 1/52]
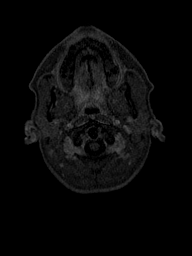
[im 13/52]
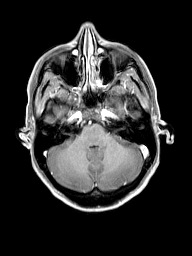
[im 26/52]
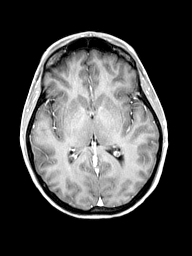
[im 39/52]
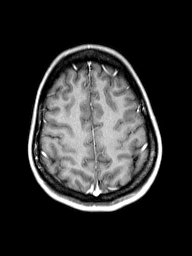
[im 52/52]
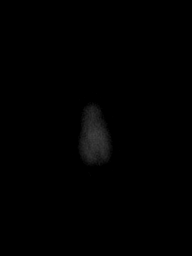

[Series 15: T1 post-contrast · coronal · 5.0mm · 0.43mm/px · 3 of 29 slices shown (2 of 2)]
[im 1/29]
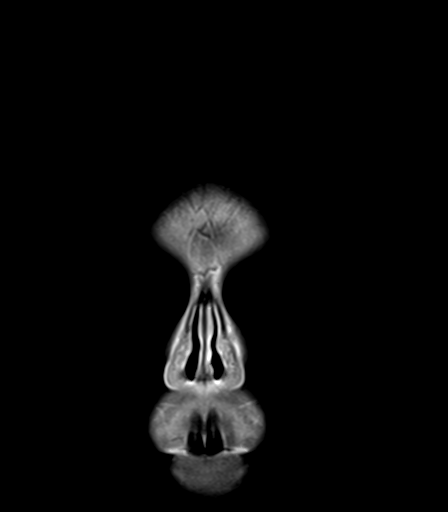
[im 15/29]
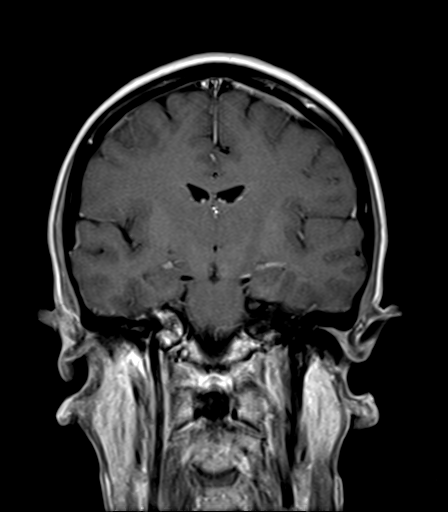
[im 29/29]
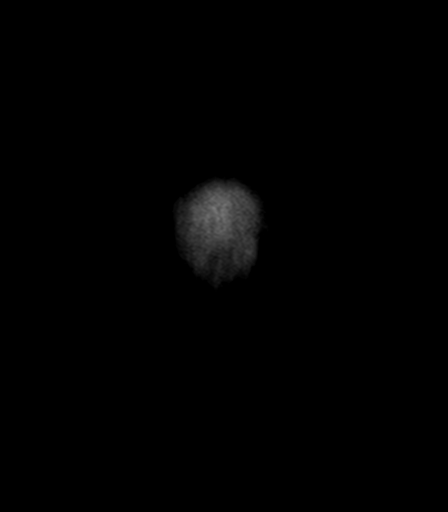

[Series 100: DWI · axial · 3.0mm · 1.80mm/px · z∈[-96,+60]mm · 5 of 53 slices shown (3 of 4)]
[im 1/53]
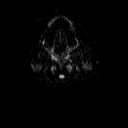
[im 14/53]
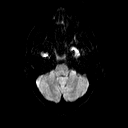
[im 27/53]
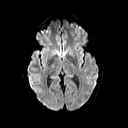
[im 40/53]
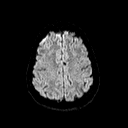
[im 53/53]
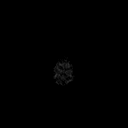

[Series 101: DWI · coronal · 3.0mm · 1.80mm/px · 4 of 44 slices shown (4 of 4)]
[im 1/44]
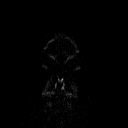
[im 15/44]
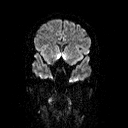
[im 29/44]
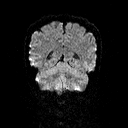
[im 44/44]
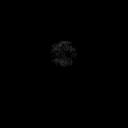

[40 of 48 positions shown; findings below may reference images not displayed]

FINDINGS: MRI HEAD FINDINGS

Cerebral volume within normal limits for patient age. No focal
parenchymal signal abnormality.

No abnormal foci of restricted diffusion to suggest acute
intracranial infarct. Gray-white matter differentiation well
maintained. Normal intravascular flow voids preserved. No acute or
chronic intracranial hemorrhage. No areas of chronic infarction.

No mass lesion, midline shift, or mass effect. No hydrocephalus. No
extra-axial fluid collection. No abnormal enhancement.

Chiari 1 malformation present with the cerebellar tonsils positioned
up to 1.6 cm below the foramen magnum. The cerebellar tonsils are
somewhat peak on sagittal FLAIR sequence. No associated syrinx.

Pituitary gland normal.  Globes and orbits within normal limits.

Paranasal sinuses and mastoid air cells are clear. Inner ear
structures normal.

Bone marrow signal intensity within normal limits. No scalp soft
tissue abnormality.

MRA HEAD FINDINGS

ANTERIOR CIRCULATION:

Visualized distal cervical segments of the internal carotid arteries
are widely patent with antegrade flow. Petrous, cavernous, and
supraclinoid segments are widely patent. A1 segments, anterior
communicating artery, anterior slow or arteries well opacified and
normal. M1 segments widely patent without stenosis or occlusion. MCA
bifurcations within normal limits. Distal MCA branches well
opacified and symmetric.

POSTERIOR CIRCULATION:

Vertebral arteries are widely patent to the vertebrobasilar
junction. Posterior inferior cerebral arteries patent bilaterally.
Basilar artery widely patent to its distal aspect. Dominant left
anterior inferior cerebral artery noted. Superior cerebral arteries
patent bilaterally. Both posterior cerebral arteries arise from the
basilar artery and are well opacified to their distal aspects. Small
posterior communicating arteries noted bilaterally.

No aneurysm or vascular malformation.

MRA NECK FINDINGS

Partially visualized aortic arch grossly unremarkable. No high-grade
stenosis at the origin of the great vessels. Partially visualized
subclavian arteries within normal limits.

Right common carotid artery widely patent from its origin to the
bifurcation. No significant atheromatous disease about the right
trifurcation. Right ICA widely patent from the bifurcation to the
circle of Willis. No stenosis, evidence for dissection, or vascular
occlusion within the right carotid artery system. Right external
carotid artery is branches grossly normal.

The left common carotid artery widely patent from its origin to the
bifurcation. No significant atheromatous disease about the
bifurcation. Left ICA widely patent from the bifurcation to the
circle of Willis. No stenosis, dissection, or vascular occlusion
within the left carotid artery system. Left external carotid artery
and its branches grossly normal.

Both vertebral arteries arise from the subclavian arteries.
Vertebral arteries widely patent with antegrade flow without
evidence for dissection, stenosis, or occlusion.
IMPRESSION: MRI HEAD IMPRESSION:

1. Chiari 1 malformation.
2. Otherwise normal brain MRI.

MRA HEAD IMPRESSION:

Normal intracranial MRA.

MRA NECK IMPRESSION:

Normal MRA of the neck.

## 2016-02-26 IMAGING — MR MR MRA NECK WO/W CM
3 series · 37 of 48 positions shown · IV contrast (multihance)
Comparison: None.

CLINICAL DATA: Initial evaluation for 1 week history of increasing
headaches and nausea.

EXAM:
MRI HEAD WITHOUT AND WITH CONTRAST
MRA HEAD WITHOUT CONTRAST
MRA NECK WITHOUT AND WITH CONTRAST
TECHNIQUE: Multiplanar, multiecho pulse sequences of the brain and surrounding
structures were obtained without and with intravenous contrast.
Angiographic images of the Circle of Willis were obtained using MRA
technique without intravenous contrast. Angiographic images of the
neck were obtained using MRA technique without and with intravenous
contrast. Carotid stenosis measurements (when applicable) are
obtained utilizing NASCET criteria, using the distal internal
carotid diameter as the denominator.
CONTRAST:  8mL MULTIHANCE GADOBENATE DIMEGLUMINE 529 MG/ML IV SOLN

[Series 3: (id) · axial · 1.0mm · 0.52mm/px · z∈[-118,+100]mm · 15 of 230 slices shown]
[im 1/230]
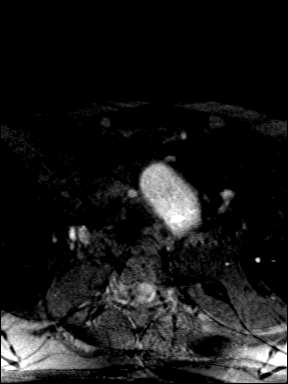
[im 10/230]
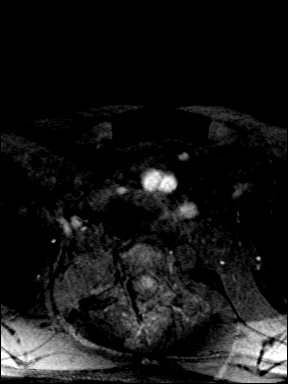
[im 19/230]
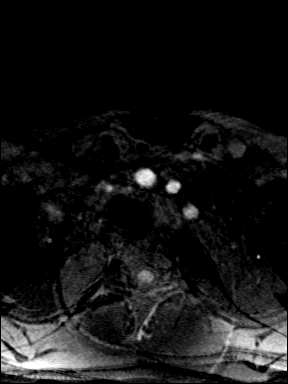
[im 28/230]
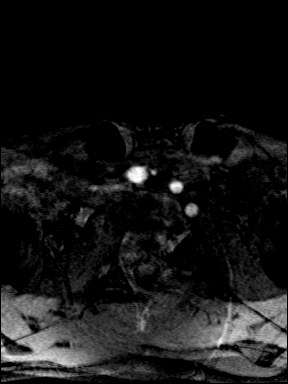
[im 37/230]
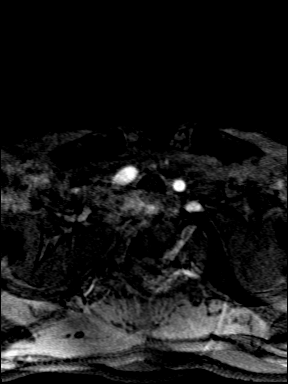
[im 46/230]
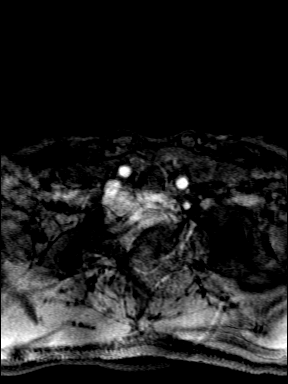
[im 55/230]
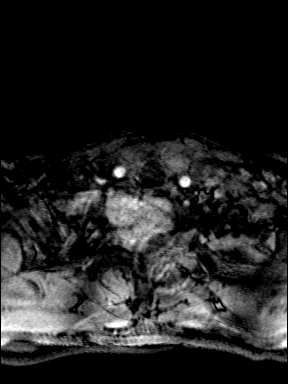
[im 65/230]
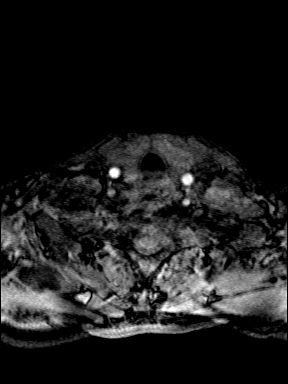
[im 74/230]
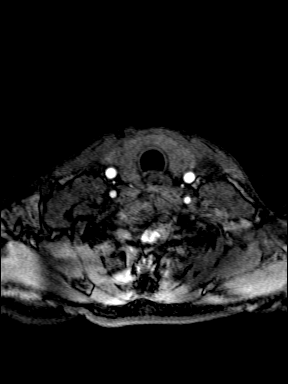
[im 101/230]
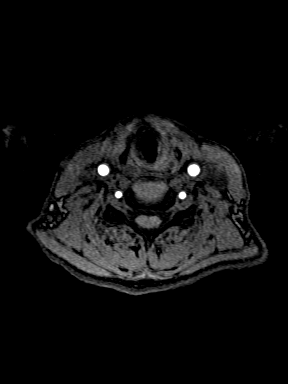
[im 120/230]
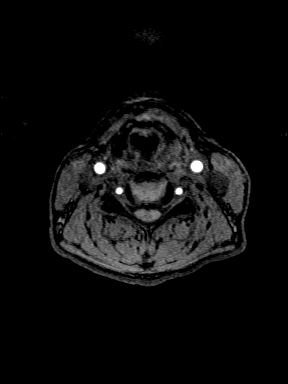
[im 129/230]
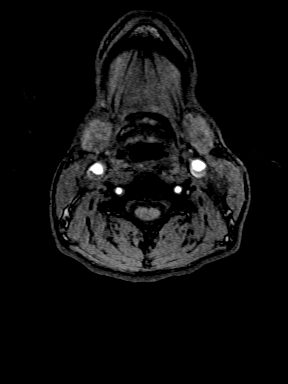
[im 156/230]
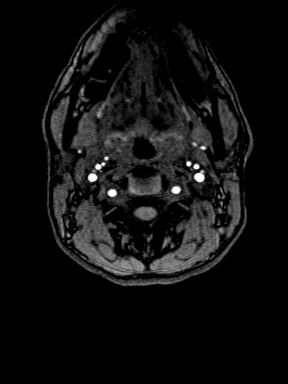
[im 193/230]
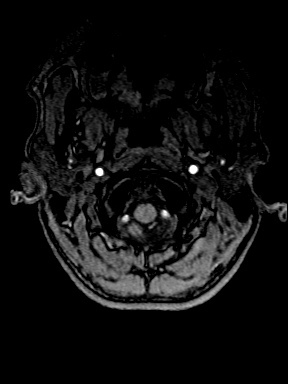
[im 220/230]
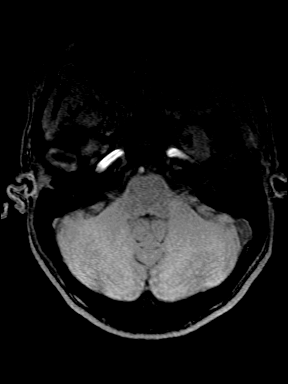

[Series 9: MRA · coronal · 0.7mm · 0.84mm/px · 11 of 101 slices shown (1 of 2)]
[im 1/101]
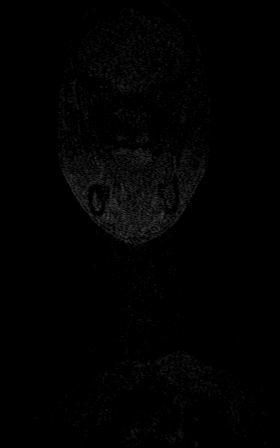
[im 11/101]
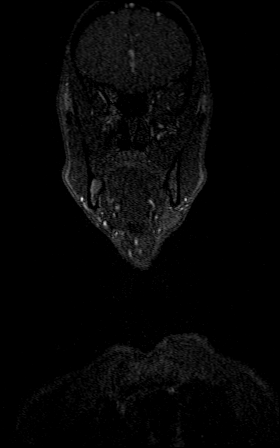
[im 21/101]
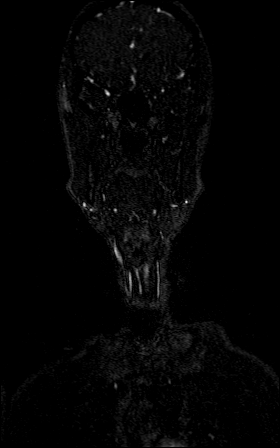
[im 31/101]
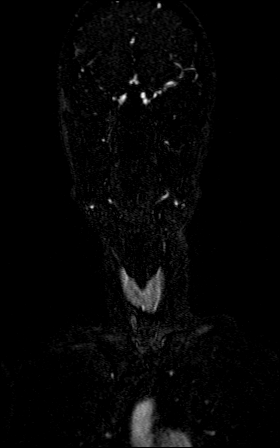
[im 41/101]
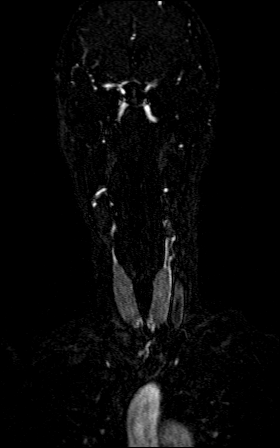
[im 51/101]
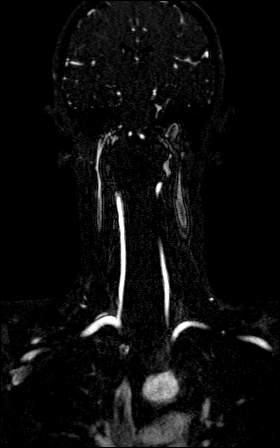
[im 61/101]
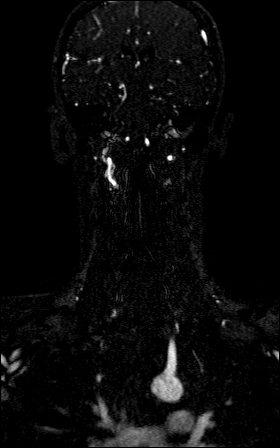
[im 71/101]
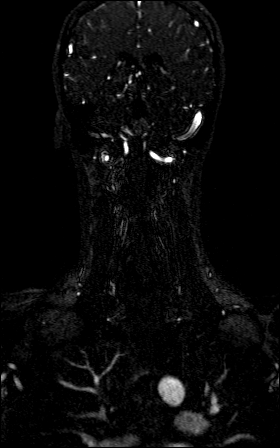
[im 81/101]
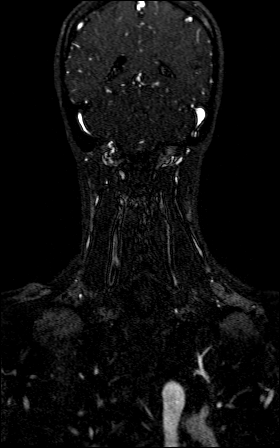
[im 91/101]
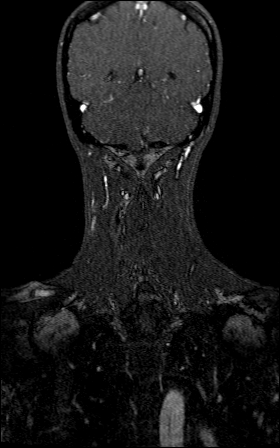
[im 101/101]
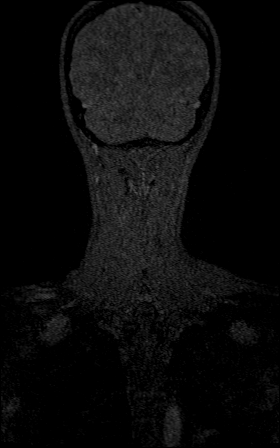

[Series 10: MRA · coronal · 0.7mm · 0.84mm/px · 11 of 98 slices shown (2 of 2)]
[im 1/98]
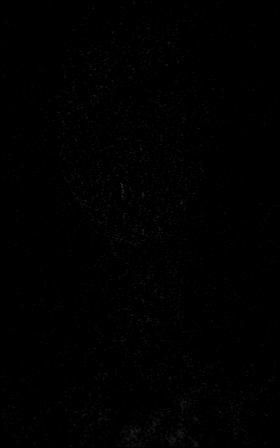
[im 10/98]
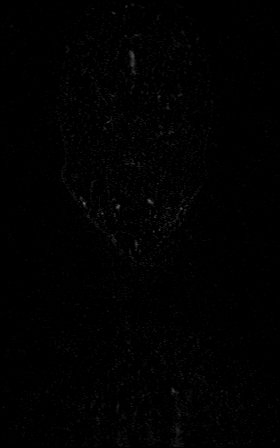
[im 20/98]
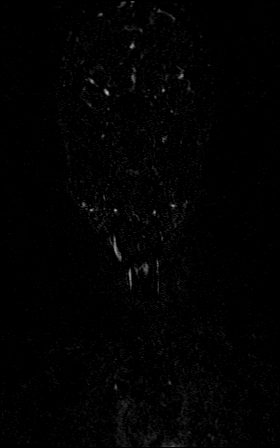
[im 30/98]
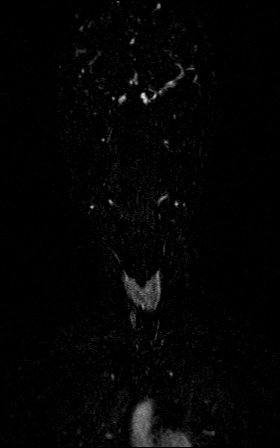
[im 39/98]
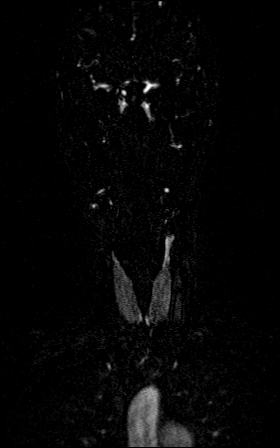
[im 49/98]
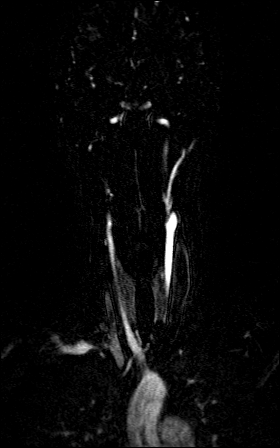
[im 59/98]
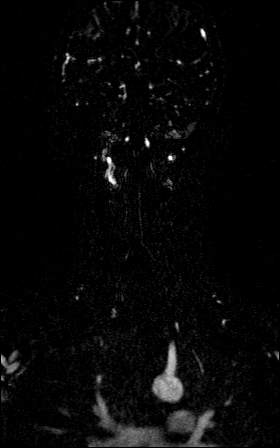
[im 68/98]
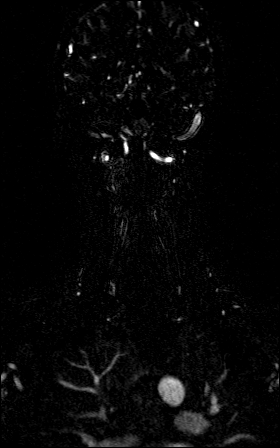
[im 78/98]
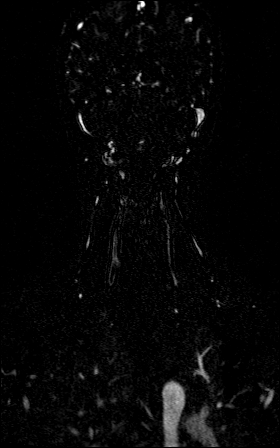
[im 88/98]
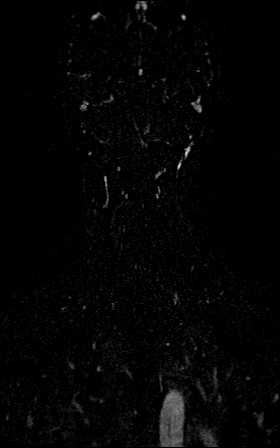
[im 98/98]
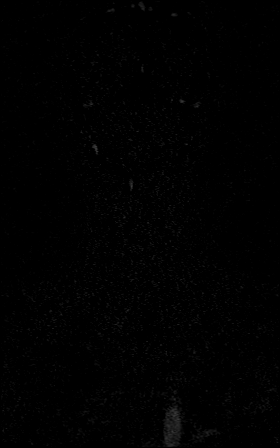

[37 of 48 positions shown; findings below may reference images not displayed]

FINDINGS: MRI HEAD FINDINGS

Cerebral volume within normal limits for patient age. No focal
parenchymal signal abnormality.

No abnormal foci of restricted diffusion to suggest acute
intracranial infarct. Gray-white matter differentiation well
maintained. Normal intravascular flow voids preserved. No acute or
chronic intracranial hemorrhage. No areas of chronic infarction.

No mass lesion, midline shift, or mass effect. No hydrocephalus. No
extra-axial fluid collection. No abnormal enhancement.

Chiari 1 malformation present with the cerebellar tonsils positioned
up to 1.6 cm below the foramen magnum. The cerebellar tonsils are
somewhat peak on sagittal FLAIR sequence. No associated syrinx.

Pituitary gland normal.  Globes and orbits within normal limits.

Paranasal sinuses and mastoid air cells are clear. Inner ear
structures normal.

Bone marrow signal intensity within normal limits. No scalp soft
tissue abnormality.

MRA HEAD FINDINGS

ANTERIOR CIRCULATION:

Visualized distal cervical segments of the internal carotid arteries
are widely patent with antegrade flow. Petrous, cavernous, and
supraclinoid segments are widely patent. A1 segments, anterior
communicating artery, anterior slow or arteries well opacified and
normal. M1 segments widely patent without stenosis or occlusion. MCA
bifurcations within normal limits. Distal MCA branches well
opacified and symmetric.

POSTERIOR CIRCULATION:

Vertebral arteries are widely patent to the vertebrobasilar
junction. Posterior inferior cerebral arteries patent bilaterally.
Basilar artery widely patent to its distal aspect. Dominant left
anterior inferior cerebral artery noted. Superior cerebral arteries
patent bilaterally. Both posterior cerebral arteries arise from the
basilar artery and are well opacified to their distal aspects. Small
posterior communicating arteries noted bilaterally.

No aneurysm or vascular malformation.

MRA NECK FINDINGS

Partially visualized aortic arch grossly unremarkable. No high-grade
stenosis at the origin of the great vessels. Partially visualized
subclavian arteries within normal limits.

Right common carotid artery widely patent from its origin to the
bifurcation. No significant atheromatous disease about the right
trifurcation. Right ICA widely patent from the bifurcation to the
circle of Willis. No stenosis, evidence for dissection, or vascular
occlusion within the right carotid artery system. Right external
carotid artery is branches grossly normal.

The left common carotid artery widely patent from its origin to the
bifurcation. No significant atheromatous disease about the
bifurcation. Left ICA widely patent from the bifurcation to the
circle of Willis. No stenosis, dissection, or vascular occlusion
within the left carotid artery system. Left external carotid artery
and its branches grossly normal.

Both vertebral arteries arise from the subclavian arteries.
Vertebral arteries widely patent with antegrade flow without
evidence for dissection, stenosis, or occlusion.
IMPRESSION: MRI HEAD IMPRESSION:

1. Chiari 1 malformation.
2. Otherwise normal brain MRI.

MRA HEAD IMPRESSION:

Normal intracranial MRA.

MRA NECK IMPRESSION:

Normal MRA of the neck.

## 2016-04-12 ENCOUNTER — Ambulatory Visit: Payer: BC Managed Care – PPO | Attending: EXTERNAL

## 2016-04-12 DIAGNOSIS — R300 Dysuria: Secondary | ICD-10-CM | POA: Insufficient documentation

## 2016-04-12 LAB — URINALYSIS, MACROSCOPIC
BILIRUBIN: NEGATIVE mg/dL
BLOOD: NEGATIVE mg/dL
GLUCOSE: NEGATIVE mg/dL
LEUKOCYTES: NEGATIVE WBCs/uL
LEUKOCYTES: NEGATIVE WBCs/uL
NITRITE: NEGATIVE
PH: 5.5 (ref 5.0–8.0)
PROTEIN: NEGATIVE mg/dL
SPECIFIC GRAVITY: 1.02 (ref 1.005–1.030)
UROBILINOGEN: 0.2 mg/dL (ref 0.2–1.0)

## 2016-04-12 LAB — URINALYSIS, MICROSCOPIC

## 2017-06-27 ENCOUNTER — Emergency Department
Admission: EM | Admit: 2017-06-27 | Discharge: 2017-06-27 | Disposition: A | Discharge status: Home / Self Care | Payer: BC Managed Care – PPO

## 2017-06-27 ENCOUNTER — Other Ambulatory Visit: Payer: Self-pay

## 2017-06-27 ENCOUNTER — Emergency Department (HOSPITAL_COMMUNITY): Payer: BC Managed Care – PPO

## 2017-06-27 ENCOUNTER — Encounter (HOSPITAL_COMMUNITY): Payer: Self-pay

## 2017-06-27 DIAGNOSIS — G935 Compression of brain: Secondary | ICD-10-CM | POA: Insufficient documentation

## 2017-06-27 DIAGNOSIS — Z79899 Other long term (current) drug therapy: Secondary | ICD-10-CM | POA: Insufficient documentation

## 2017-06-27 DIAGNOSIS — Q796 Ehlers-Danlos syndrome: Secondary | ICD-10-CM | POA: Insufficient documentation

## 2017-06-27 DIAGNOSIS — R42 Dizziness and giddiness: Secondary | ICD-10-CM | POA: Insufficient documentation

## 2017-06-27 DIAGNOSIS — Z791 Long term (current) use of non-steroidal anti-inflammatories (NSAID): Secondary | ICD-10-CM | POA: Insufficient documentation

## 2017-06-27 DIAGNOSIS — Z7982 Long term (current) use of aspirin: Secondary | ICD-10-CM | POA: Insufficient documentation

## 2017-06-27 HISTORY — DX: Convergence insufficiency: H51.11

## 2017-06-27 HISTORY — DX: Gastroparesis: K31.84

## 2017-06-27 HISTORY — DX: Familial dysautonomia (riley-day): G90.1

## 2017-06-27 HISTORY — DX: Compression of brain: G93.5

## 2017-06-27 HISTORY — DX: Other chronic pain: G89.29

## 2017-06-27 HISTORY — DX: Other specified disorders involving the immune mechanism, not elsewhere classified: D89.89

## 2017-06-27 HISTORY — DX: Ehlers-Danlos syndrome, unspecified: Q79.60

## 2017-06-27 LAB — CBC WITH DIFF
BASOPHIL #: <0.04 x10ˆ3/uL (ref <=0.20)
BASOPHIL %: 0 %
EOSINOPHIL #: 0.09 x10ˆ3/uL (ref <=0.50)
EOSINOPHIL %: 1 %
HCT: 35.2 % (ref 34.8–46.0)
HGB: 11.9 g/dL (ref 11.5–16.0)
HGB: 11.9 g/dL (ref 11.5–16.0)
IMMATURE GRANULOCYTE #: <0.04 x10ˆ3/uL (ref <0.10)
IMMATURE GRANULOCYTE %: 0 % (ref 0–1)
LYMPHOCYTE #: 3.39 x10ˆ3/uL (ref 1.00–4.80)
LYMPHOCYTE %: 35 %
MCH: 31.8 pg (ref 26.0–32.0)
MCHC: 33.8 g/dL (ref 31.0–35.5)
MCHC: 33.8 g/dL (ref 31.0–35.5)
MCV: 94.1 fL (ref 78.0–100.0)
MONOCYTE #: 0.68 x10ˆ3/uL (ref 0.20–1.10)
MONOCYTE %: 7 %
MPV: 9.5 fL (ref 8.7–12.5)
NEUTROPHIL #: 5.39 x10ˆ3/uL (ref 1.50–7.70)
NEUTROPHIL %: 57 %
PLATELETS: 301 x10ˆ3/uL (ref 150–400)
RBC: 3.74 x10ˆ6/uL — ABNORMAL LOW (ref 3.85–5.22)
RDW-CV: 12.8 % (ref 11.5–15.5)
WBC: 9.6 x10ˆ3/uL (ref 3.7–11.0)

## 2017-06-27 LAB — URINALYSIS, MACROSCOPIC
BILIRUBIN: NEGATIVE mg/dL
BLOOD: NEGATIVE mg/dL
GLUCOSE: NEGATIVE mg/dL
KETONES: NEGATIVE mg/dL
LEUKOCYTES: NEGATIVE WBCs/uL
NITRITE: NEGATIVE
PH: 6 (ref 5.0–8.0)
PROTEIN: NEGATIVE mg/dL
SPECIFIC GRAVITY: 1.01 (ref 1.005–1.030)
UROBILINOGEN: 0.2 mg/dL (ref 0.2–1.0)

## 2017-06-27 LAB — GRAY TOP TUBE

## 2017-06-27 LAB — COMPREHENSIVE METABOLIC PANEL, NON-FASTING
ALBUMIN: 3.9 g/dL (ref 3.5–5.0)
ALKALINE PHOSPHATASE: 62 U/L (ref 38–126)
ALKALINE PHOSPHATASE: 62 U/L (ref 38–126)
ALT (SGPT): 18 U/L (ref 9–52)
ANION GAP: 4 mmol/L — ABNORMAL LOW (ref 10–20)
AST (SGOT): 17 U/L (ref 14–36)
BILIRUBIN TOTAL: 0.3 mg/dL (ref 0.3–1.3)
BUN/CREA RATIO: 17
BUN: 12 mg/dL (ref 8–25)
CALCIUM: 9.2 mg/dL (ref 8.5–10.4)
CHLORIDE: 109 mmol/L — ABNORMAL HIGH (ref 98–107)
CO2 TOTAL: 28 mmol/L (ref 22–32)
CREATININE: 0.7 mg/dL (ref 0.49–1.10)
ESTIMATED GFR: 94 mL/min/{1.73_m2}
GLUCOSE: 79 mg/dL (ref 70–105)
POTASSIUM: 3.7 mmol/L (ref 3.5–5.1)
PROTEIN TOTAL: 6.5 g/dL (ref 6.4–8.3)
SODIUM: 141 mmol/L (ref 137–145)

## 2017-06-27 LAB — URINE DRUG SCREEN
BARBITURATES URINE: NOT DETECTED
COCAINE METABOLITES URINE: NOT DETECTED
METHADONE URINE: NOT DETECTED
TRICYCLIC ANTIDEPRESSANTS URINE: NOT DETECTED

## 2017-06-27 LAB — URINALYSIS, MICROSCOPIC

## 2017-06-27 LAB — THYROID STIMULATING HORMONE (SENSITIVE TSH)
TSH: 1.13 u[IU]/mL (ref 0.350–5.000)
TSH: 1.13 u[IU]/mL (ref 0.350–5.000)

## 2017-06-27 LAB — MAGNESIUM: MAGNESIUM: 1.9 mg/dL (ref 1.6–2.5)

## 2017-06-27 LAB — BLUE TOP TUBE

## 2017-06-27 MED ORDER — HEPARIN, PORCINE (PF) 100 UNIT/ML INTRAVENOUS SYRINGE
3.0000 mL | INJECTION | Freq: Once | INTRAVENOUS | Status: AC
Start: 2017-06-27 — End: 2017-06-27
  Administered 2017-06-27: 3 mL
  Filled 2017-06-27: qty 3

## 2017-06-27 MED ORDER — MECLIZINE 12.5 MG TABLET
50.0000 mg | ORAL_TABLET | Freq: Once | ORAL | Status: AC
Start: 2017-06-27 — End: 2017-06-27
  Administered 2017-06-27: 50 mg via ORAL
  Filled 2017-06-27: qty 4

## 2017-06-27 MED ORDER — MECLIZINE 25 MG TABLET
50.00 mg | ORAL_TABLET | Freq: Two times a day (BID) | ORAL | 0 refills | Status: DC | PRN
Start: 2017-06-27 — End: 2018-05-03

## 2017-06-27 MED ORDER — MECLIZINE 12.5 MG TABLET
12.50 mg | ORAL_TABLET | Freq: Three times a day (TID) | ORAL | Status: DC | PRN
Start: 2017-06-27 — End: 2017-06-27

## 2017-06-27 NOTE — ED Triage Notes (Addendum)
Patient states for the past few weeks her dizziness has been getting worse and she started a new medication for headaches per a neurologist. Home health nurse states she hears a slower heart rate and abnormal heart sound. Patient states she is also nauseous and has issues with sitting and standing up right. Patient also states headaches are worse. Patient states she has a hickman catheter in place.

## 2017-06-27 NOTE — ED Provider Notes (Signed)
[place] OF EMERGENCY MEDICINE  EMERGENCY DEPARTMENT HISTORY AND PHYSICAL      Chief Complaint:    Patient is a 37 y.o.  female presenting to the ED with chief complaint of DIZZINESS     History of Present Illness:  THIS IS A 57-YEAR-OLD FEMALE PRESENTS TO THE EMERGENCY DEPARTMENT WITH THE ABOVE-STATED CHIEF COMPLAINT.  SHE HAS HISTORY OF CHIARI MALFORMATION AND HAS BEEN EXPERIENCING INCREASED DIZZINESS OVER THE PAST COUPLE OF WEEKS.  SHE DESCRIBES THE DIZZINESS AS A SENSATION OF MOTION.  SHE WAS RECENTLY STARTED ON A BETA BLOCKER TO HELP DECREASE HER MIGRAINES AND REPORTED TO HER DOCTOR WHAT IT BEEN GOING ON AND THEY STOPPED HER TAKING IT 2 DAYS AGO.  SHE REPORTS NAUSEA ASSOCIATED WITH THE DIZZINESS.  SHE REPORTS HER HEADACHES ARE WORSE IN THE PAST WEEK.  SHE DENIES OTHER ACUTE CHANGES OR COMPLAINTS AT THIS TIME.      Review of Systems:  12 POINT REVIEW OF SYSTEMS OBTAINED FROM PATIENT IS NEGATIVE FOR ACUTE CHANGES OTHER THAN WHAT IS DOCUMENTED ABOVE.      Past Medical History:  Past Medical History:   Diagnosis Date   . Autoimmune disorder (CMS HCC)    . Chiari I malformation (CMS HCC)    . Chronic pain    . Convergence insufficiency    . Dysautonomia (CMS [place])    . Ehlers-Danlos syndrome    . Ehlers-Danlos syndrome    . Gastroparesis      Past Surgical History:   Procedure Laterality Date   . Esophageal dilation     . Hx myomectomy     . Hx wisdom teeth extraction     . Posterior fossa decompression         Above history reviewed with patient.  Allergies, medication list, and old records also reviewed.     There is no problem list on file for this patient.      Social History:    Social History     Tobacco Use   . Smoking status: Current Every Day Smoker     Packs/day: 1.50     Years: 20.00     Pack years: 30.00   . Smokeless tobacco: Never Used   Substance Use Topics   . Alcohol use: Never     Frequency: Never   . Drug use: Yes     Types: Marijuana         Filed Vitals:    06/27/17 1716 06/27/17  1919   BP: 118/81 115/75   Pulse: 82 66   Resp: 17 18   Temp: 37.2 C (98.9 F) 36.7 C (98.1 F)   SpO2: 98% 93%       Physical Exam:     Nursing note and vitals reviewed.  Vital signs reviewed as above. No acute distress.   Constitutional: Pt is well-developed and well-nourished.   Head: Normocephalic and atraumatic.   Eyes: Conjunctivae are normal. Pupils are equal, round, and reactive to light. EOM are intact  Ears: TM's clear bilaterally  Mouth: Moist, without erythema  Neck: Soft, supple, full range of motion.  Heart: Normal rate AND RHYTHM  Pulmonary/Chest: No respiratory distress.  BREATH SOUNDS CLEAR TO AUSCULTATION BILATERALLY  Abdomen: Soft, active bowel sounds.  NONTENDER TO PALPATION  Musculoskeletal: Normal range of motion. No deformities.   Neurological: No focal deficits noted.  Skin: No rash or lesions  Psychiatric: Patient has a normal mood and affect.       Labs:  Results for orders placed or performed during the hospital encounter of 06/27/17   COMPREHENSIVE METABOLIC PANEL, NON-FASTING   Result Value Ref Range    SODIUM 141 137 - 145 mmol/L    POTASSIUM 3.7 3.5 - 5.1 mmol/L    CHLORIDE 109 (H) 98 - 107 mmol/L    CO2 TOTAL 28 22 - 32 mmol/L    ANION GAP 4 (L) 10 - 20 mmol/L    BUN 12 8 - 25 mg/dL    CREATININE 0.70 0.49 - 1.10 mg/dL    BUN/CREA RATIO 17     ESTIMATED GFR 94 mL/min/1.2m^2    ALBUMIN 3.9 3.5 - 5.0 g/dL    CALCIUM 9.2 8.5 - 10.4 mg/dL    GLUCOSE 79 70 - 105 mg/dL    ALKALINE PHOSPHATASE 62 38 - 126 U/L    ALT (SGPT) 18 9 - 52 U/L    AST (SGOT) 17 14 - 36 U/L    BILIRUBIN TOTAL 0.3 0.3 - 1.3 mg/dL    PROTEIN TOTAL 6.5 6.4 - 8.3 g/dL   MAGNESIUM   Result Value Ref Range    MAGNESIUM 1.9 1.6 - 2.5 mg/dL   THYROID STIMULATING HORMONE (SENSITIVE TSH)   Result Value Ref Range    TSH 1.130 0.350 - 5.000 uIU/mL   HCG, URINE QUALITATIVE, PREGNANCY   Result Value Ref Range    HCG URINE QUALITATIVE Negative    URINE DRUG SCREEN   Result Value Ref Range    TRICYCLIC ANTIDEPRESSANTS URINE  Not Detected Not Detected    BARBITURATES URINE Not Detected Not Detected    METHADONE URINE Not Detected Not Detected    BENZODIAZEPINES URINE Not Detected Not Detected    CANNABINOIDS URINE Detected (A) Not Detected    OPIATES URINE Not Detected Not Detected    AMPHETAMINES URINE Not Detected Not Detected    COCAINE METABOLITES URINE Not Detected Not Detected    PPX Not Detected Not Detected    METHAMPHETAMINES URINE None Detected None Detected   CBC WITH DIFF   Result Value Ref Range    WBC 9.6 3.7 - 11.0 x10^3/uL    RBC 3.74 (L) 3.85 - 5.22 x10^6/uL    HGB 11.9 11.5 - 16.0 g/dL    HCT 35.2 34.8 - 46.0 %    MCV 94.1 78.0 - 100.0 fL    MCH 31.8 26.0 - 32.0 pg    MCHC 33.8 31.0 - 35.5 g/dL    RDW-CV 12.8 11.5 - 15.5 %    PLATELETS 301 150 - 400 x10^3/uL    MPV 9.5 8.7 - 12.5 fL    NEUTROPHIL % 57 %    LYMPHOCYTE % 35 %    MONOCYTE % 7 %    EOSINOPHIL % 1 %    BASOPHIL % 0 %    NEUTROPHIL # 5.39 1.50 - 7.70 x10^3/uL    LYMPHOCYTE # 3.39 1.00 - 4.80 x10^3/uL    MONOCYTE # 0.68 0.20 - 1.10 x10^3/uL    EOSINOPHIL # 0.09 <=0.50 x10^3/uL    BASOPHIL # <0.04 <=0.20 x10^3/uL    IMMATURE GRANULOCYTE % 0 0 - 1 %    IMMATURE GRANULOCYTE # <0.04 <0.10 x10^3/uL   URINALYSIS, MACROSCOPIC   Result Value Ref Range    COLOR Yellow     APPEARANCE Clear     SPECIFIC GRAVITY 1.010 1.005 - 1.030    PH 6.0 5.0 - 8.0    LEUKOCYTES Negative Negative WBCs/uL    NITRITE Negative Negative  PROTEIN Negative Negative mg/dL    GLUCOSE Negative Negative mg/dL    KETONES Negative Negative mg/dL    UROBILINOGEN 0.2  0.2 - 1.0 mg/dL    BILIRUBIN Negative Negative mg/dL    BLOOD Negative Negative mg/dL   URINALYSIS, MICROSCOPIC   Result Value Ref Range    RBCS  Occasional or less, 0-2, 1-3, None /hpf    WBCS 0-2 Occasional or less, 0-2, 1-3, None /hpf    BACTERIA  None /hpf    SQUAMOUS EPITHELIAL Moderate /hpf       Imaging:  NONCONTRAST CT BRAIN SHOWS NO ACUTE PARENCHYMAL PROCESS.         Orders Placed This Encounter   . CT BRAIN WO IV CONTRAST      . CBC/DIFF   . COMPREHENSIVE METABOLIC PANEL, NON-FASTING   . MAGNESIUM   . THYROID STIMULATING HORMONE (SENSITIVE TSH)   . HCG, URINE QUALITATIVE, PREGNANCY   . URINALYSIS, MACROSCOPIC AND MICROSCOPIC W/CULTURE REFLEX   . URINE DRUG SCREEN   . CBC WITH DIFF   . URINALYSIS, MACROSCOPIC   . URINALYSIS, MICROSCOPIC   . BLUE TOP TUBE   . GOLD TOP TUBE   . GRAY TOP TUBE   . meclizine (ANTIVERT) 25 mg Oral Tablet           MDM:      has a current medication list which includes the following prescription(s): aspirin-acetaminophen-caffeine, capsaicin, cetirizine, cholecalciferol (vitamin d3), diphenhydramine, hydroxyzine pamoate, ibuprofen, meclizine, methocarbamol, mirtazapine, naproxen, olanzapine, olanzapine, ondansetron, pantoprazole, ranitidine, sodium chloride, trazodone, and vitamin a.     During the patient's stay in the emergency department, the above listed imaging and/or labs were performed to assist with medical decision making and were reviewed by myself as available for review.    Patient rechecked and remained stable throughout the emergency department course.      Results discussed with patient.    All questions/concerns addressed, and patient agrees with disposition plan.    Advised that patient return to ED immediately for any new or worsening symptoms; otherwise, follow up with PCP.    IMPRESSION:   Encounter Diagnoses   Name Primary?   . Vertigo Yes   . Dizziness            DISPOSITION/PLAN:    Discharged  Emilie Rutter, [place]  [address]  [address]  [place] [address]  [phone]    In 4 days

## 2017-06-27 NOTE — ED Nurses Note (Signed)
Verbal and written discharge instructions reviewed with patient. Rx given. Patient verbalized understanding of all instructions. Refused w/c for transport out. Ambulatory to vehicle with family. Tolerated activity well.

## 2017-06-27 NOTE — ED Nurses Note (Addendum)
To xray per w/c with ED staff.

## 2017-06-27 NOTE — ED Nurses Note (Signed)
Returned to ED#2 per w/c with radiology staff. Tolerated activity well.

## 2017-06-28 LAB — GOLD TOP TUBE

## 2017-07-18 ENCOUNTER — Ambulatory Visit: Payer: BC Managed Care – PPO | Attending: Psychiatric/Mental Health

## 2017-07-18 DIAGNOSIS — Z5181 Encounter for therapeutic drug level monitoring: Secondary | ICD-10-CM | POA: Insufficient documentation

## 2017-07-18 DIAGNOSIS — Z79899 Other long term (current) drug therapy: Secondary | ICD-10-CM | POA: Insufficient documentation

## 2017-07-18 LAB — CBC WITH DIFF
BASOPHIL #: <0.04 10*3/uL (ref <=0.20)
BASOPHIL %: 1 %
EOSINOPHIL #: 0.05 10*3/uL (ref <=0.50)
EOSINOPHIL %: 1 %
HCT: 38.3 % (ref 34.8–46.0)
HGB: 12.9 g/dL (ref 11.5–16.0)
IMMATURE GRANULOCYTE #: <0.04 10*3/uL (ref <0.10)
IMMATURE GRANULOCYTE %: 0 % (ref 0–1)
LYMPHOCYTE #: 1.59 10*3/uL (ref 1.00–4.80)
LYMPHOCYTE %: 31 %
MCH: 31.2 pg (ref 26.0–32.0)
MCHC: 33.7 g/dL (ref 31.0–35.5)
MCV: 92.7 fL (ref 78.0–100.0)
MONOCYTE #: 0.51 10*3/uL (ref 0.20–1.10)
MONOCYTE %: 10 %
MPV: 9.2 fL (ref 8.7–12.5)
NEUTROPHIL #: 3.02 10*3/uL (ref 1.50–7.70)
NEUTROPHIL %: 57 %
PLATELETS: 329 10*3/uL (ref 150–400)
RBC: 4.13 10*6/uL (ref 3.85–5.22)
RDW-CV: 12.1 % (ref 11.5–15.5)
WBC: 5.2 x10ˆ3/uL (ref 3.7–11.0)

## 2017-07-18 LAB — THYROID STIMULATING HORMONE (SENSITIVE TSH): TSH: 0.517 u[IU]/mL (ref 0.350–5.000)

## 2017-07-18 LAB — VITAMIN D 25 TOTAL: VITAMIN D 25, TOTAL: 31.6 ng/mL

## 2017-07-18 LAB — THYROXINE, FREE (FREE T4): THYROXINE (T4), FREE: 0.99 ng/dL (ref 0.78–2.19)

## 2017-08-23 ENCOUNTER — Other Ambulatory Visit: Payer: Self-pay

## 2017-08-23 ENCOUNTER — Encounter (HOSPITAL_COMMUNITY): Payer: Self-pay

## 2017-08-23 ENCOUNTER — Emergency Department
Admission: EM | Admit: 2017-08-23 | Discharge: 2017-08-23 | Disposition: A | Discharge status: Home / Self Care | Payer: BC Managed Care – PPO | Attending: INTERNAL MEDICINE | Admitting: INTERNAL MEDICINE

## 2017-08-23 DIAGNOSIS — Q796 Ehlers-Danlos syndrome, unspecified: Secondary | ICD-10-CM

## 2017-08-23 DIAGNOSIS — Z79899 Other long term (current) drug therapy: Secondary | ICD-10-CM | POA: Insufficient documentation

## 2017-08-23 DIAGNOSIS — Z880 Allergy status to penicillin: Secondary | ICD-10-CM | POA: Insufficient documentation

## 2017-08-23 DIAGNOSIS — A1801 Tuberculosis of spine: Secondary | ICD-10-CM | POA: Insufficient documentation

## 2017-08-23 DIAGNOSIS — T80219A Unspecified infection due to central venous catheter, initial encounter: Secondary | ICD-10-CM | POA: Insufficient documentation

## 2017-08-23 DIAGNOSIS — F17210 Nicotine dependence, cigarettes, uncomplicated: Secondary | ICD-10-CM | POA: Insufficient documentation

## 2017-08-23 DIAGNOSIS — Z881 Allergy status to other antibiotic agents status: Secondary | ICD-10-CM | POA: Insufficient documentation

## 2017-08-23 DIAGNOSIS — Z7982 Long term (current) use of aspirin: Secondary | ICD-10-CM | POA: Insufficient documentation

## 2017-08-23 DIAGNOSIS — G935 Compression of brain: Secondary | ICD-10-CM | POA: Insufficient documentation

## 2017-08-23 HISTORY — DX: Headache, unspecified: R51.9

## 2017-08-23 NOTE — ED Nurses Note (Signed)
Pt given discharge instructions. Verbalized understanding. Pt left via amb in stable cond

## 2017-08-23 NOTE — ED Triage Notes (Signed)
Patient present sot ED with c/o white milky substance noted in central line

## 2017-08-23 NOTE — ED Nurses Note (Signed)
cvc dressing changed by morris rn

## 2017-08-23 NOTE — ED Provider Notes (Signed)
[facility]  Emergency Department Note        Date:   08/23/2017  Name: Holly Fritz  Age: 37 y.o.  MRN: [id number]      Chief Complaint: Infection  History by patient and family.  History of Present Illness  Patient is a 37 year old female who has quite a few chronic issues she was on TPN for year and a half is which was stopped in November she has a subclavian central line on the right side and she thought that there was an infection yesterday she saw little bit of milky stuff in the his line but right now it has been blood in it.  The dressing has not been changed over the top of the central line.  She is not spiking any fevers she does not have any chest pain chest tightness no shortness of breath no headache no dizziness there is actually no redness around the central line no pus no discharge.    Past Medical History  Past Medical History:   Diagnosis Date   . Autoimmune disorder (CMS HCC)    . Chiari I malformation (CMS HCC)    . Chronic pain    . Convergence insufficiency    . Dysautonomia (CMS [place])    . Ehlers-Danlos syndrome    . Ehlers-Danlos syndrome    . Gastroparesis    . Headache         Past Surgical History:   Procedure Laterality Date   . ESOPHAGEAL DILATION     . HX MYOMECTOMY     . HX WISDOM TEETH EXTRACTION     . POSTERIOR FOSSA DECOMPRESSION              Current Outpatient Medications   Medication Sig   . aspirin-acetaminophen-caffeine (EXCEDRIN) 250-250-65 mg Oral Tablet Take 2 Tabs by mouth Every 6 hours as needed for Migraine   . capsaicin (ZOSTRIX) 0.025 % Cream by Apply Topically route Twice daily   . cetirizine (ZYRTEC) 10 mg Oral Tablet Take 10 mg by mouth Once a day   . cholecalciferol, vitamin D3, 1,000 unit Oral Tablet Take 1,000 Units by mouth Once a day   . diphenhydrAMINE (BENADRYL) 25 mg Oral Capsule Take 25 mg by mouth Once per day as needed   . hydrOXYzine pamoate (VISTARIL) 50 mg Oral Capsule Take 50 mg by mouth Three times a day as needed for Itching   .  Ibuprofen (MOTRIN) 200 mg Oral Tablet Take 200 mg by mouth Four times a day as needed for Pain   . meclizine (ANTIVERT) 25 mg Oral Tablet Take 2 Tabs (50 mg total) by mouth Twice per day as needed for Dizziness   . methocarbamol (ROBAXIN) 750 mg Oral Tablet Take 750 mg by mouth Once per day as needed   . mirtazapine (REMERON) 30 mg Oral Tablet Take 30 mg by mouth Every night   . naproxen (NAPROSYN) 250 mg Oral Tablet Take 220 mg by mouth Once per day as needed for Pain   . OLANZapine (ZYPREXA) 10 mg Oral Tablet Take 10 mg by mouth Every night   . OLANZapine (ZYPREXA) 5 mg Oral Tablet Take 5 mg by mouth Every morning   . ondansetron (ZOFRAN ODT) 4 mg Oral Tablet, Rapid Dissolve Take 4 mg by mouth Every 8 hours as needed for nausea/vomiting   . pantoprazole (PROTONIX) 40 mg Oral Tablet, Delayed Release (E.C.) Take 40 mg by mouth Twice daily   . raNITIdine (ZANTAC) 150 mg  Oral Tablet Take 150 mg by mouth Once per day as needed   . sodium chloride (SALINE NOSE NASL) by Nasal route   . traZODone (DESYREL) 150 mg Oral Tablet Take 150 mg by mouth Every night   . vitamin A (AQUASOL A) 10,000 unit Oral Capsule Take 10,000 Units by mouth Once a day     Allergies   Allergen Reactions   . Doxycycline    . Penicillins        Family History  Family Medical History:     None                  noncontributory    Social History     Tobacco Use   . Smoking status: Current Every Day Smoker     Packs/day: 1.50     Years: 20.00     Pack years: 30.00   . Smokeless tobacco: Never Used   Substance Use Topics   . Alcohol use: Not on file       Review of Systems  12 systems reviewed all are  negative with the exception of what is listed in the HPI. Pt in no acute distress.    Physical Examination:    BP 104/79   Pulse 92   Temp 36.9 C (98.5 F)   Resp 18   Ht 1.626 m (5\' 4" )   Wt 58.1 kg (128 lb)   SpO2 94%   BMI 21.97 kg/m         General appearance: alert, cooperative, no distress, appears stated age  Head: Normocephalic, without  obvious abnormality, atraumatic  Eyes: conjunctivae/corneas clear. PERRLA  Ears: normal TM's and external ear canals clear  Nose: Nares normal. Mucosa normal.  Throat: Lips, mucosa, and tongue normal.   Neck: supple, symmetrical, trachea midline, no adenopathy  Lungs: clear to auscultation bilaterally, no crackles, no wheezes   Heart: regular rate and rhythm, , no significant murmurs, no carotid bruits  Abdomen: no abdominal pain,tenderness, guarding, rigidity and rebound tenderness    Extremities: extremities normal, atraumatic, no cyanosis or edema  Lymph nodes: Cervical, nodes normal.  Affect : pleasant and interactive.  Skin: no rash  Right subclavian site area looks okay to me there is no tenderness no pus or drainage around the site we are going to do a dressing change.  The catheters looked okay.  No results found for any visits on 08/23/17.  THE AREA OF THE CENTRAL LINE WAS CLEANED AND NEW DRESSING WAS DONE  Assessment and Plan    ENCOUNTER DIAGNOSES     ICD-10-CM   1. Central venous line infection, initial encounter T80.219A   2. 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 ADULT ROUTINE BLOOD CULTURE, SET OF 2 BOTTLES (BACTERIA AND YEAST)   . ADULT ROUTINE BLOOD CULTURE, SET OF 2 BOTTLES (BACTERIA AND YEAST)   . Refer to [facility] General Surgery    I explained to her that since she is not having any symptoms at this has line has been in there for 2 years which causes a chronic tunneling.  It is best that a surgeon take it out.  Is under control setting.  He definitely needs to come out because it is not serving any purpose right now it is been there for long time I will get her in follow-up appointment with Dr. Myriam Jacobson next week.  I will also get some blood cultures before we  let her go she otherwise is asymptomatic will also do the dressing change.  Two sites of blood culture were done 1 from central line and 1 peripheral access  Outpatient referral to [facility] Dr.  Myriam Jacobson was done  Patient happy with her care understands agrees with the plan if there is any problems issues any new drainage fever to come back right away to the ER otherwise patient will be discharged from the ER in stable condition.            Dyanne Iha, MD

## 2017-08-23 NOTE — Discharge Instructions (Signed)
Please follow up with Dr. Myriam Jacobson surgery is for possible removal of the central line or central line care.

## 2017-08-26 ENCOUNTER — Encounter (HOSPITAL_COMMUNITY): Payer: Self-pay

## 2017-08-26 ENCOUNTER — Other Ambulatory Visit: Payer: Self-pay

## 2017-08-26 ENCOUNTER — Emergency Department (HOSPITAL_COMMUNITY): Payer: BC Managed Care – PPO | Admitting: Radiology

## 2017-08-26 ENCOUNTER — Inpatient Hospital Stay
Admission: EM | Admit: 2017-08-26 | Discharge: 2017-08-29 | DRG: 314 | Disposition: A | Discharge status: Home / Self Care | Payer: BC Managed Care – PPO | Attending: INTERNAL MEDICINE | Admitting: INTERNAL MEDICINE

## 2017-08-26 DIAGNOSIS — Q796 Ehlers-Danlos syndrome, unspecified: Secondary | ICD-10-CM | POA: Diagnosis present

## 2017-08-26 DIAGNOSIS — H5111 Convergence insufficiency: Secondary | ICD-10-CM | POA: Diagnosis present

## 2017-08-26 DIAGNOSIS — Z880 Allergy status to penicillin: Secondary | ICD-10-CM

## 2017-08-26 DIAGNOSIS — T80219A Unspecified infection due to central venous catheter, initial encounter: Secondary | ICD-10-CM

## 2017-08-26 DIAGNOSIS — Y848 Other medical procedures as the cause of abnormal reaction of the patient, or of later complication, without mention of misadventure at the time of the procedure: Secondary | ICD-10-CM | POA: Diagnosis present

## 2017-08-26 DIAGNOSIS — K3184 Gastroparesis: Secondary | ICD-10-CM | POA: Diagnosis present

## 2017-08-26 DIAGNOSIS — G901 Familial dysautonomia [Riley-Day]: Secondary | ICD-10-CM | POA: Diagnosis present

## 2017-08-26 DIAGNOSIS — Z79899 Other long term (current) drug therapy: Secondary | ICD-10-CM

## 2017-08-26 DIAGNOSIS — R7881 Bacteremia: Secondary | ICD-10-CM | POA: Diagnosis present

## 2017-08-26 DIAGNOSIS — B957 Other staphylococcus as the cause of diseases classified elsewhere: Secondary | ICD-10-CM | POA: Diagnosis present

## 2017-08-26 DIAGNOSIS — T80211A Bloodstream infection due to central venous catheter, initial encounter: Principal | ICD-10-CM | POA: Diagnosis present

## 2017-08-26 DIAGNOSIS — G935 Compression of brain: Secondary | ICD-10-CM | POA: Diagnosis present

## 2017-08-26 DIAGNOSIS — Z87891 Personal history of nicotine dependence: Secondary | ICD-10-CM

## 2017-08-26 DIAGNOSIS — Z881 Allergy status to other antibiotic agents status: Secondary | ICD-10-CM

## 2017-08-26 DIAGNOSIS — T827XXA Infection and inflammatory reaction due to other cardiac and vascular devices, implants and grafts, initial encounter: Secondary | ICD-10-CM

## 2017-08-26 DIAGNOSIS — D8989 Other specified disorders involving the immune mechanism, not elsewhere classified: Secondary | ICD-10-CM | POA: Diagnosis present

## 2017-08-26 DIAGNOSIS — Z9889 Other specified postprocedural states: Secondary | ICD-10-CM

## 2017-08-26 LAB — ADULT ROUTINE BLOOD CULTURE, SET OF 2 BOTTLES (BACTERIA AND YEAST): BLOOD CULTURE, ROUTINE: ABNORMAL — CR

## 2017-08-26 LAB — MANUAL DIFF AND MORPHOLOGY-SYSMEX
BASOPHIL #: <0.04 x10ˆ3/uL (ref <=0.20)
BASOPHIL %: 0 %
EOSINOPHIL #: <0.04 x10ˆ3/uL (ref <=0.50)
EOSINOPHIL %: 0 %
LYMPHOCYTE #: 2.32 x10ˆ3/uL (ref 1.00–4.80)
LYMPHOCYTE %: 26 %
MONOCYTE #: 0.25 x10ˆ3/uL (ref 0.20–1.10)
MONOCYTE %: 3 %
NEUTROPHIL #: 5.73 x10ˆ3/uL (ref 1.50–7.70)
NEUTROPHIL %: 69 %
RBC MORPHOLOGY: NORMAL
REACTIVE LYMPHOCYTE %: 2 %

## 2017-08-26 LAB — COMPREHENSIVE METABOLIC PANEL, NON-FASTING
ALBUMIN: 4.3 g/dL (ref 3.5–5.0)
ALKALINE PHOSPHATASE: 120 U/L (ref 38–126)
ALT (SGPT): 47 U/L — ABNORMAL HIGH (ref <=35)
ANION GAP: 6 mmol/L — ABNORMAL LOW (ref 10–20)
AST (SGOT): 48 U/L — ABNORMAL HIGH (ref 14–36)
BILIRUBIN TOTAL: 0.2 mg/dL — ABNORMAL LOW (ref 0.3–1.3)
BUN/CREA RATIO: 10
BUN: 7 mg/dL — ABNORMAL LOW (ref 8–25)
CALCIUM: 9.7 mg/dL (ref 8.5–10.4)
CHLORIDE: 103 mmol/L (ref 98–107)
CO2 TOTAL: 29 mmol/L (ref 22–32)
CREATININE: 0.7 mg/dL (ref 0.49–1.10)
ESTIMATED GFR: 94 mL/min/1.73mˆ2
GLUCOSE: 95 mg/dL (ref 70–105)
POTASSIUM: 3.7 mmol/L (ref 3.5–5.1)
PROTEIN TOTAL: 7.5 g/dL (ref 6.4–8.3)
SODIUM: 138 mmol/L (ref 137–145)

## 2017-08-26 LAB — CBC WITH DIFF
HCT: 35.7 % (ref 34.8–46.0)
HGB: 11.8 g/dL (ref 11.5–16.0)
MCH: 28.6 pg (ref 26.0–32.0)
MCHC: 33.1 g/dL (ref 31.0–35.5)
MCV: 86.4 fL (ref 78.0–100.0)
MPV: 9.1 fL (ref 8.7–12.5)
PLATELETS: 296 x10ˆ3/uL (ref 150–400)
RBC: 4.13 x10ˆ6/uL (ref 3.85–5.22)
RDW-CV: 12.8 % (ref 11.5–15.5)
WBC: 8.3 x10ˆ3/uL (ref 3.7–11.0)

## 2017-08-26 LAB — LACTIC ACID LEVEL: LACTIC ACID: 0.9 mmol/L (ref 0.7–2.1)

## 2017-08-26 MED ORDER — VANCOMYCIN 1,000 MG (50 MG/ML) INTRAVENOUS INJECTION
20.00 mg/kg | INTRAVENOUS | Status: AC
Start: 2017-08-26 — End: 2017-08-27
  Administered 2017-08-26: 1000 mg via INTRAVENOUS
  Administered 2017-08-27: 0 mg via INTRAVENOUS
  Filled 2017-08-26: qty 10

## 2017-08-26 MED ORDER — VANCOMYCIN 1,000 MG (50 MG/ML) INTRAVENOUS INJECTION
1000.00 mg | Freq: Two times a day (BID) | INTRAVENOUS | Status: DC
Start: 2017-08-27 — End: 2017-08-28
  Administered 2017-08-27: 0 mg via INTRAVENOUS
  Administered 2017-08-27: 1000 mg via INTRAVENOUS
  Administered 2017-08-28 (×2): 0 mg via INTRAVENOUS
  Administered 2017-08-28 (×2): 1000 mg via INTRAVENOUS
  Filled 2017-08-26 (×5): qty 20

## 2017-08-26 MED ORDER — VANCOMYCIN IV - PHARMACIST TO DOSE PER PROTOCOL
Freq: Every day | Status: DC | PRN
Start: 2017-08-26 — End: 2017-08-29

## 2017-08-26 MED ORDER — SODIUM CHLORIDE 0.9 % INTRAVENOUS SOLUTION
INTRAVENOUS | Status: DC
Start: 2017-08-26 — End: 2017-08-27
  Filled 2017-08-26: qty 1000

## 2017-08-26 MED ORDER — ONDANSETRON HCL (PF) 4 MG/2 ML INJECTION SOLUTION
4.00 mg | INTRAMUSCULAR | Status: AC
Start: 2017-08-26 — End: 2017-08-26
  Administered 2017-08-26: 4 mg via INTRAVENOUS
  Filled 2017-08-26: qty 2

## 2017-08-26 MED ORDER — HYDROMORPHONE 1 MG/ML INJECTION WRAPPER
0.5000 mg | INJECTION | INTRAMUSCULAR | Status: DC | PRN
Start: 2017-08-26 — End: 2017-08-29
  Administered 2017-08-27 – 2017-08-29 (×6): 0.5 mg via INTRAVENOUS
  Filled 2017-08-26 (×6): qty 1

## 2017-08-26 MED ORDER — ONDANSETRON HCL (PF) 4 MG/2 ML INJECTION SOLUTION
4.0000 mg | Freq: Four times a day (QID) | INTRAMUSCULAR | Status: DC | PRN
Start: 2017-08-26 — End: 2017-08-29
  Administered 2017-08-27 – 2017-08-28 (×2): 4 mg via INTRAVENOUS
  Filled 2017-08-26 (×2): qty 2

## 2017-08-26 MED ORDER — SODIUM CHLORIDE 0.9 % INTRAVENOUS SOLUTION
INTRAVENOUS | Status: DC
Start: 2017-08-27 — End: 2017-08-28
  Filled 2017-08-26 (×3): qty 1000

## 2017-08-26 MED ORDER — HYDROMORPHONE 1 MG/ML INJECTION WRAPPER
0.50 mg | INJECTION | INTRAMUSCULAR | Status: AC
Start: 2017-08-26 — End: 2017-08-26
  Administered 2017-08-26: 0.5 mg via INTRAVENOUS
  Filled 2017-08-26: qty 1

## 2017-08-26 NOTE — ED Nurses Note (Signed)
PT TRANSPORTED TO ROOM 9A VIA WHEELCHAIR BY CNA AT THIS TIME. ALL BELONGINGS SENT WITH PT.

## 2017-08-26 NOTE — ED Triage Notes (Signed)
Pt states she was seen on Saturday for infected central line. Was called today for positive blood cultures.

## 2017-08-26 NOTE — Pharmacy Vancomycin Dosing (Signed)
[facility] / Department of Pharmaceutical Services  Therapeutic Drug Monitoring: Vancomycin  08/26/2017      Patient name: Holly Fritz, Holly Fritz  Date of Birth:  Aug 13, 1980    Actual Weight:  Weight: 56.7 kg (125 lb) (08/26/17 2106)     BMI:  BMI (Calculated): 21.5 (08/26/17 2106)    Date RPh Current regimen (including mg/kg) Indication Target Levels (mcg/mL) SCr (mg/dL) CrCl* (mL/min) Measured level (mcg/mL) Plan (including when levels are due) Comments   08/26/2017   Clement Sayres, PHARMD    1000 mg (17.6 mg/kg) IV q12h Bacteremia 15-20 0.7 95 NA 1000 mg x 1 given in ED at 2300. Start 1000 mg IV q12h at 1100 tomorrow (1100/2300). Vanc trough due 7/18 at 1000, one hour prior to dose.  WBC 8.3, BUN 7, Temp 98.5.                                                                              *Creatinine clearance is estimated by using the Cockcroft-Gault equation for adult patients and the Carol Ada for pediatric patients.    The decision to discontinue vancomycin therapy will be determined by the primary service.  Please contact the pharmacist with any questions regarding this patient's medication regimen.

## 2017-08-26 NOTE — ED Nurses Note (Signed)
REPORT CALLED TO ALEXIS, RN AT THIS TIME.

## 2017-08-27 ENCOUNTER — Encounter (HOSPITAL_COMMUNITY)
Admission: EM | Disposition: A | Discharge status: Home / Self Care | Payer: Self-pay | Source: Home / Self Care | Attending: INTERNAL MEDICINE

## 2017-08-27 ENCOUNTER — Encounter (HOSPITAL_COMMUNITY): Payer: Self-pay | Admitting: Certified Registered"

## 2017-08-27 ENCOUNTER — Inpatient Hospital Stay (HOSPITAL_COMMUNITY): Payer: BC Managed Care – PPO | Admitting: Certified Registered"

## 2017-08-27 ENCOUNTER — Inpatient Hospital Stay (HOSPITAL_COMMUNITY): Payer: BC Managed Care – PPO

## 2017-08-27 DIAGNOSIS — G935 Compression of brain: Secondary | ICD-10-CM | POA: Diagnosis present

## 2017-08-27 DIAGNOSIS — R7881 Bacteremia: Secondary | ICD-10-CM

## 2017-08-27 DIAGNOSIS — K3184 Gastroparesis: Secondary | ICD-10-CM | POA: Diagnosis present

## 2017-08-27 DIAGNOSIS — Q796 Ehlers-Danlos syndrome, unspecified: Secondary | ICD-10-CM | POA: Diagnosis present

## 2017-08-27 DIAGNOSIS — I361 Nonrheumatic tricuspid (valve) insufficiency: Secondary | ICD-10-CM

## 2017-08-27 LAB — TRANSTHORACIC ECHOCARDIOGRAM - ADULT
AV LVOT peak gradient: 4 mmHg
AV mean gradient: 6 mmHg
AV mean gradient: 6 mmHg
Ao VTI: 37.3 cm
Ao peak vel: 148 cm/s
Ao root annulus: 2 cm
Ao root annulus: 2 cm
Aortic Valve Area by Continuity of Peak Velocity: 1.22 cm2
Aortic Valve Area by Continuity of VTI: 1.23 cm2
Ascending aorta: 2.7 cm
E wave decelartion time: 268 ms
E/A ratio: 1.7
E/E' ratio: 7.1
Inferior Vena Cava Diameter: 1.95 cm
Inferior Vena Cava Diameter: 1.95 cm
LA Volume Index: 16.9 ml/m2
LA size: 3 cm
LA size: 3 cm
LA volume: 19.9 cm3
LVOT diameter: 1.5 cm
LVOT stroke volume: 46 cm3
Left Atrium to Aortic Root Ratio: 1.5
Left Ventricular Outflow Tract Peak Velocity: 102 cm/s
MV Comp VTI: 39.2 cm
MV Peak A Vel: 62.4 cm/s
MV Peak E Vel: 107 cm/s
MV mean gradient: 2 mmHg
MV stenosis pressure 1/2 time: 5 ms
Mitral Valve Max Velocity: 131 cm/s
Mr max vel: 310 cm/s
PV mean gradient: 1 mmHg
PV peak gradient: 2 mmHg
PV peak gradient: 2 mmHg
Pulmonic Regurgitant Max Peak Gradiant: 1 mmHg
Pulmonic Valve Acceleration Time: 165 ms
Pulmonic Valve Max Velocity: 67.4 cm/s
Pulmonic Valve Systolic Velocity Time Integral: 22.3 cm
RVDD: 4.07 cm
RVDD: 4.07 cm
RVDD: 4.07 cm
Right Atrium Pressure: 3 mmHg
TDI: 15.1 cm/s
TDI: 15.1 cm/s

## 2017-08-27 LAB — BLUE TOP TUBE

## 2017-08-27 LAB — GOLD TOP TUBE

## 2017-08-27 LAB — SEDIMENTATION RATE: ERYTHROCYTE SEDIMENTATION RATE (ESR): 7 mm/h (ref 0–20)

## 2017-08-27 SURGERY — REMOVAL INFUSAPORT
Anesthesia: Monitor Anesthesia Care | Laterality: Right | Wound class: Dirty or Infected Wounds-Include old traumatic wounds

## 2017-08-27 MED ORDER — NICOTINE 21 MG/24 HR DAILY TRANSDERMAL PATCH
21.0000 mg | MEDICATED_PATCH | Freq: Every day | TRANSDERMAL | Status: DC
Start: 2017-08-27 — End: 2017-08-29
  Administered 2017-08-27 – 2017-08-29 (×3): 21 mg via TRANSDERMAL
  Filled 2017-08-27 (×3): qty 1

## 2017-08-27 MED ORDER — LIDOCAINE 1 %-EPINEPHRINE 1:100,000 INJECTION SOLUTION
Freq: Once | INTRAMUSCULAR | Status: DC | PRN
Start: 2017-08-27 — End: 2017-08-27
  Administered 2017-08-27: 2 mL via INTRAMUSCULAR

## 2017-08-27 MED ORDER — DEXMEDETOMIDINE 100 MCG/ML INTRAVENOUS SOLUTION
Freq: Once | INTRAVENOUS | Status: DC | PRN
Start: 2017-08-27 — End: 2017-08-27
  Administered 2017-08-27: 30 ug via INTRAVENOUS

## 2017-08-27 MED ORDER — CAPSAICIN 0.025 % TOPICAL CREAM
TOPICAL_CREAM | Freq: Two times a day (BID) | CUTANEOUS | Status: DC
Start: 2017-08-27 — End: 2017-08-29
  Administered 2017-08-28: 0 via TOPICAL
  Filled 2017-08-27: qty 60

## 2017-08-27 MED ORDER — SODIUM CHLORIDE 0.9 % (FLUSH) INJECTION SYRINGE
3.0000 mL | INJECTION | INTRAMUSCULAR | Status: DC | PRN
Start: 2017-08-27 — End: 2017-08-29

## 2017-08-27 MED ORDER — CHOLECALCIFEROL (VITAMIN D3) 25 MCG (1,000 UNIT) TABLET
1000.0000 [IU] | ORAL_TABLET | Freq: Every day | ORAL | Status: DC
Start: 2017-08-27 — End: 2017-08-29
  Administered 2017-08-27 – 2017-08-29 (×3): 1000 [IU] via ORAL
  Filled 2017-08-27 (×3): qty 1

## 2017-08-27 MED ORDER — LACTATED RINGERS INTRAVENOUS SOLUTION
INTRAVENOUS | Status: DC | PRN
Start: 2017-08-27 — End: 2017-08-27

## 2017-08-27 MED ORDER — OLANZAPINE 2.5 MG TABLET
10.00 mg | ORAL_TABLET | Freq: Every evening | ORAL | Status: DC
Start: 2017-08-27 — End: 2017-08-29
  Administered 2017-08-27 – 2017-08-28 (×2): 10 mg via ORAL
  Filled 2017-08-27 (×2): qty 4

## 2017-08-27 MED ORDER — PANTOPRAZOLE 40 MG INTRAVENOUS SOLUTION
40.0000 mg | Freq: Once | INTRAVENOUS | Status: DC
Start: 2017-08-27 — End: 2017-08-27

## 2017-08-27 MED ORDER — METHOCARBAMOL 500 MG TABLET
750.00 mg | ORAL_TABLET | Freq: Every day | ORAL | Status: DC | PRN
Start: 2017-08-27 — End: 2017-08-29

## 2017-08-27 MED ORDER — PROPOFOL 10 MG/ML INTRAVENOUS EMULSION
INTRAVENOUS | Status: DC | PRN
Start: 2017-08-27 — End: 2017-08-27
  Administered 2017-08-27: 100 ug/kg/min via INTRAVENOUS
  Administered 2017-08-27: 120 ug/kg/min via INTRAVENOUS
  Administered 2017-08-27: 13:00:00 0 ug/kg/min via INTRAVENOUS

## 2017-08-27 MED ORDER — GLYCOPYRROLATE 0.2 MG/ML INJECTION SOLUTION
Freq: Once | INTRAMUSCULAR | Status: DC | PRN
Start: 2017-08-27 — End: 2017-08-27
  Administered 2017-08-27: 0.2 mg via INTRAVENOUS

## 2017-08-27 MED ORDER — HYDROXYZINE PAMOATE 25 MG CAPSULE
50.00 mg | ORAL_CAPSULE | Freq: Three times a day (TID) | ORAL | Status: DC | PRN
Start: 2017-08-27 — End: 2017-08-29
  Administered 2017-08-27 (×2): 50 mg via ORAL
  Filled 2017-08-27 (×4): qty 2

## 2017-08-27 MED ORDER — LIDOCAINE (PF) 100 MG/5 ML (2 %) INTRAVENOUS SYRINGE
INJECTION | Freq: Once | INTRAVENOUS | Status: DC | PRN
Start: 2017-08-27 — End: 2017-08-27
  Administered 2017-08-27: 100 mg via INTRAVENOUS

## 2017-08-27 MED ORDER — ONDANSETRON 4 MG DISINTEGRATING TABLET
4.00 mg | ORAL_TABLET | Freq: Three times a day (TID) | ORAL | Status: DC | PRN
Start: 2017-08-27 — End: 2017-08-29
  Administered 2017-08-27 – 2017-08-29 (×3): 4 mg via ORAL
  Filled 2017-08-27 (×4): qty 1

## 2017-08-27 MED ORDER — TRAZODONE 50 MG TABLET
150.00 mg | ORAL_TABLET | Freq: Every evening | ORAL | Status: DC
Start: 2017-08-27 — End: 2017-08-29
  Administered 2017-08-27 – 2017-08-28 (×2): 150 mg via ORAL
  Filled 2017-08-27 (×2): qty 3

## 2017-08-27 MED ORDER — LIDOCAINE 1 %-EPINEPHRINE 1:100,000 INJECTION SOLUTION
INTRAMUSCULAR | Status: AC
Start: 2017-08-27 — End: 2017-08-28
  Filled 2017-08-27: qty 20

## 2017-08-27 MED ORDER — DEXAMETHASONE SODIUM PHOSPHATE 4 MG/ML INJECTION SOLUTION
Freq: Once | INTRAMUSCULAR | Status: DC | PRN
Start: 2017-08-27 — End: 2017-08-27
  Administered 2017-08-27: 12:00:00 8 mg via INTRAVENOUS

## 2017-08-27 MED ORDER — ONDANSETRON HCL (PF) 4 MG/2 ML INJECTION SOLUTION
Freq: Once | INTRAMUSCULAR | Status: DC | PRN
Start: 2017-08-27 — End: 2017-08-27
  Administered 2017-08-27: 4 mg via INTRAVENOUS

## 2017-08-27 MED ORDER — VITAMIN A 3,000 MCG (10,000 UNIT) CAPSULE
10000.0000 [IU] | ORAL_CAPSULE | Freq: Every day | ORAL | Status: DC
Start: 2017-08-27 — End: 2017-08-29
  Administered 2017-08-27 – 2017-08-29 (×3): 0 [IU] via ORAL

## 2017-08-27 MED ORDER — CETIRIZINE 10 MG TABLET
10.0000 mg | ORAL_TABLET | Freq: Every day | ORAL | Status: DC
Start: 2017-08-27 — End: 2017-08-29
  Administered 2017-08-27 – 2017-08-29 (×3): 10 mg via ORAL
  Filled 2017-08-27 (×3): qty 1

## 2017-08-27 MED ORDER — MECLIZINE 25 MG TABLET
50.00 mg | ORAL_TABLET | Freq: Two times a day (BID) | ORAL | Status: DC | PRN
Start: 2017-08-27 — End: 2017-08-29

## 2017-08-27 MED ORDER — MIRTAZAPINE 15 MG TABLET
30.00 mg | ORAL_TABLET | Freq: Every evening | ORAL | Status: DC
Start: 2017-08-27 — End: 2017-08-29
  Administered 2017-08-27 – 2017-08-28 (×2): 30 mg via ORAL
  Filled 2017-08-27 (×2): qty 2

## 2017-08-27 MED ORDER — SODIUM CHLORIDE 0.9 % (FLUSH) INJECTION SYRINGE
3.0000 mL | INJECTION | Freq: Three times a day (TID) | INTRAMUSCULAR | Status: DC
Start: 2017-08-27 — End: 2017-08-29
  Administered 2017-08-27: 3 mL
  Administered 2017-08-27: 0 mL
  Administered 2017-08-28: 10 mL
  Administered 2017-08-28 – 2017-08-29 (×2): 3 mL
  Administered 2017-08-29: 10 mL
  Filled 2017-08-27: qty 10

## 2017-08-27 MED ORDER — ENOXAPARIN 40 MG/0.4 ML SUB-Q SYRINGE - EAST
40.0000 mg | INJECTION | Freq: Every day | SUBCUTANEOUS | Status: DC
Start: 2017-08-27 — End: 2017-08-29
  Administered 2017-08-28 – 2017-08-29 (×2): 40 mg via SUBCUTANEOUS
  Filled 2017-08-27 (×3): qty 0.4

## 2017-08-27 MED ORDER — PROPOFOL 10 MG/ML IV BOLUS
INJECTION | Freq: Once | INTRAVENOUS | Status: DC | PRN
Start: 2017-08-27 — End: 2017-08-27
  Administered 2017-08-27 (×2): 50 mg via INTRAVENOUS

## 2017-08-27 MED ORDER — LACTATED RINGERS INTRAVENOUS SOLUTION
INTRAVENOUS | Status: DC
Start: 2017-08-27 — End: 2017-08-28
  Administered 2017-08-27: 0 via INTRAVENOUS

## 2017-08-27 MED ORDER — BUPIVACAINE (PF) 0.25 % (2.5 MG/ML) INJECTION SOLUTION
INTRAMUSCULAR | Status: AC
Start: 2017-08-27 — End: 2017-08-28
  Filled 2017-08-27: qty 10

## 2017-08-27 MED ORDER — OLANZAPINE 2.5 MG TABLET
5.00 mg | ORAL_TABLET | Freq: Every morning | ORAL | Status: DC
Start: 2017-08-27 — End: 2017-08-29
  Administered 2017-08-27: 5 mg via ORAL
  Administered 2017-08-28: 2.5 mg via ORAL
  Administered 2017-08-29: 5 mg via ORAL
  Filled 2017-08-27 (×2): qty 2

## 2017-08-27 MED ORDER — PANTOPRAZOLE 40 MG TABLET,DELAYED RELEASE
40.00 mg | DELAYED_RELEASE_TABLET | Freq: Two times a day (BID) | ORAL | Status: DC
Start: 2017-08-27 — End: 2017-08-29
  Administered 2017-08-27 – 2017-08-29 (×6): 40 mg via ORAL
  Filled 2017-08-27 (×5): qty 1

## 2017-08-27 MED ORDER — BUPIVACAINE (PF) 0.25 % (2.5 MG/ML) INJECTION SOLUTION
Freq: Once | INTRAMUSCULAR | Status: DC | PRN
Start: 2017-08-27 — End: 2017-08-27
  Administered 2017-08-27: 2 mL via INTRAMUSCULAR

## 2017-08-27 MED ORDER — DIPHENHYDRAMINE 25 MG CAPSULE
25.00 mg | ORAL_CAPSULE | Freq: Every day | ORAL | Status: DC | PRN
Start: 2017-08-27 — End: 2017-08-29

## 2017-08-27 MED ORDER — METOCLOPRAMIDE 5 MG/ML INJECTION SOLUTION
10.0000 mg | Freq: Once | INTRAMUSCULAR | Status: DC
Start: 2017-08-27 — End: 2017-08-27

## 2017-08-27 MED ORDER — KETOROLAC 30 MG/ML (1 ML) INJECTION SOLUTION
Freq: Once | INTRAMUSCULAR | Status: DC | PRN
Start: 2017-08-27 — End: 2017-08-27
  Administered 2017-08-27: 30 mg via INTRAVENOUS

## 2017-08-27 MED ORDER — IBUPROFEN 200 MG TABLET
200.00 mg | ORAL_TABLET | Freq: Four times a day (QID) | ORAL | Status: DC | PRN
Start: 2017-08-27 — End: 2017-08-29
  Administered 2017-08-28: 200 mg via ORAL
  Filled 2017-08-27: qty 1

## 2017-08-27 MED ADMIN — mirtazapine 15 mg tablet: ORAL | @ 21:00:00

## 2017-08-27 MED ADMIN — HYDROcodone 5 mg-acetaminophen 325 mg tablet: INTRAVENOUS | @ 12:00:00

## 2017-08-27 SURGICAL SUPPLY — 24 items
APPL 70% ISPRP 2% CHG 26ML 13._2X13.2IN CHLRPRP PREP DEHP-FR (WOUND CARE/ENTEROSTOMAL SUPPLY) ×1
APPL 70% ISPRP 2% CHG 26ML CHLRPRP HI-LT ORNG PREP STRL LF  DISP CLR (WOUND CARE SUPPLY) ×1 IMPLANT
BLADE 15 BD RB-BCK CBNSTL SURG TISS STRL LF  DISP (SURGICAL CUTTING SUPPLIES) ×1 IMPLANT
BLADE 15 BD RB-BCK CBNSTL SURG_STRL LF (CUTTING ELEMENTS) ×1
CONV USE ITEM 311375 - DRAPE ADH SPLT SHEET USLT APRTR 72X60IN STRDRP LF  STRL DISP SURG 2.5X2.5IN BLU (PROTECTIVE PRODUCTS/GARMENTS) ×2 IMPLANT
DISC USE ITEM 163322 - SYRINGE LL 20ML LTX STRL MED (NEEDLES & SYRINGE SUPPLIES) ×2 IMPLANT
DISCONTINUED USE ITEM 82149 - NEEDLE HYPO  20GA 1.5IN REG WL PRCSNGL POLYPROP REG BVL LL HUB DEHP-FR YW STRL LF  DISP (NEEDLES & SYRINGE SUPPLIES) ×1 IMPLANT
DRAPE ADH SPLT SHEET USLT APRTR 72X60IN STRDRP LF  STRL DISP SURG 2.5X2.5IN BLU (PROTECTIVE PRODUCTS/GARMENTS) ×2
DRAPE ADH SPLT SHEET USLT APRT_R 72X60IN STRDRP LF STRL DISP (PROTECTIVE PRODUCTS/GARMENTS) ×2
DRAPE CARM POLY STRAP MBL XRY 72X42IN LF  EQP (DRAPE/PACKS/SHEETS/OR TOWEL) ×1 IMPLANT
DRAPE CARM POLY STRAP MBL XRY_72X42IN LF EQP (DRAPE/PACKS/SHEETS/OR TOWEL) ×1
ELECTRODE ESURG BLADE PNCL 10FT EDGE STRL PVC DISP BUTTON SWH CORD HLSTR LF  ACPT 3/32IN STD SHAFT (CAUTERY SUPPLIES) ×1 IMPLANT
ELECTRODE ESURG BLADE PNCL 10F_T EDGE STRL DISP BUTTON SWH (CAUTERY SUPPLIES) ×1
ELECTRODE PATIENT RTN 9FT VLAB C30- LB RM PHSV ACRL FOAM CORD NONIRRITATE NONSENSITIZE ADH STRP (CAUTERY SUPPLIES) ×1 IMPLANT
ELECTRODE PATIENT RTN 9FT VLAB_REM C30- LB PLHSV ACRL FOAM (CAUTERY SUPPLIES) ×1
GAUZE SPONGE 4 X 4IN STRL_NON21426 (WOUND CARE SUPPLY) ×1 IMPLANT
GAUZE SPONGE 4 X 4IN STRL_NON21426 (WOUND CARE/ENTEROSTOMAL SUPPLY) ×1
KIT CATH PALINDROME 45CM 28CM 11 SILVER ION SYRG SEAL CAP GAUZE SPONGE ANTIMIC SLEEVE 12ML STRL LF (DIALYSIS SUPPLIES) ×1 IMPLANT
KIT CATH PALINDROME 45CM 28CM_11 SLVR ION SYRG SEAL CAP (DIALYSIS SUPPLIES) ×1
NEEDLE HYPO  21GA 1IN TW SFGLD POLYPROP REG BVL LL SHIELD MECH DEHP-FR IM GRN STRL LF  DISP (NEEDLES & SYRINGE SUPPLIES) ×1 IMPLANT
NEEDLE HYPO 20GA 1.5IN REG WL_PRCSNGL POLYPROP REG BVL LL (NEEDLES & SYRINGE SUPPLIES) ×1
NEEDLE HYPO 21GA 1IN TW SFGLD_POLYPROP REG BVL LL HUB (NEEDLES & SYRINGE SUPPLIES) ×1
SUTURE 2-0 FS ETHILON MTPS 18IN BLK MONOF NONAB (SUTURE/WOUND CLOSURE) ×2 IMPLANT
SYRINGE LL 20ML LTX STRL MED (NEEDLES & SYRINGE SUPPLIES) ×2

## 2017-08-27 NOTE — Nurses Notes (Signed)
Pt resting in bed. No complaints at this time. Call light within reach. Bed alarm activated. Bed in lowest position. Will continue to monitor.

## 2017-08-27 NOTE — Nurses Notes (Signed)
PT TRANSPORTED TO MED SURG ROOM 9 A BY THIS NURSE AND PRESTON WOODBURN-CAMP, CRNA IN STABLE CONDITION. RESPIRATIONS WITH EASE. DRESSING TO RIGHT CHEST C/D/I. IV PATENT AND INFUSING. NO COMPLAINTS VOICED. REPORT GIVEN TO Livia Snellen, RN.

## 2017-08-27 NOTE — OR Nursing (Signed)
GROUNDING PAD REMOVED. SKIN C/D/I.

## 2017-08-27 NOTE — ED Provider Notes (Signed)
[facility]  EMERGENCY DEPARTMENT NOTE    Date:   08/26/2017  Name: Holly Fritz  Age: 37 y.o.  MRN: [id number]    Chief Complaint: Wound Infection    History of Present Illness  37 year old female significant past medical history of Ehlers-Danlos syndrome presents to [facility] ED due to possible central line infection.  Patient had blood cultures drawn from her right central line and peripheral stick several days ago; patient contacted today concerning possible central line infection.  Patient states she is otherwise asymptomatic; patient denies fever.    Past Medical History  Past Medical History:   Diagnosis Date   . Autoimmune disorder (CMS HCC)    . Chiari I malformation (CMS HCC)    . Chronic pain    . Convergence insufficiency    . Dysautonomia (CMS [place])    . Ehlers-Danlos syndrome    . Ehlers-Danlos syndrome    . Gastroparesis    . Headache          Past Surgical History:   Procedure Laterality Date   . ESOPHAGEAL DILATION     . HX MYOMECTOMY     . HX WISDOM TEETH EXTRACTION     . POSTERIOR FOSSA DECOMPRESSION       No current outpatient medications on file.     Allergies   Allergen Reactions   . Adhesive Rash   . Doxycycline    . Penicillins      Family History  Family Medical History:     None        Review of Systems  Twelve point review of systems unremarkable with the exception of positives listed in History of Present Illness.     Physical Examination:    BP 100/62   Pulse 77   Temp 36.5 C (97.7 F)   Resp 18   Ht 1.626 m (5\' 4" )   Wt 57.4 kg (126 lb 8 oz)   SpO2 96%   BMI 21.71 kg/m     General Appearance: Alert and oriented; No acute distress; Cooperative; Appears stated age  Head: Normocephalic, Atraumatic  Eyes: PERRL; EOMI; Conjunctivae clear   Nose: Nares patent; No drainage appreciated  Neck: Supple; Full ROM; No lymphadenopathy    Lungs: Clear to auscultation in all lung fields; No rhonchi; No wheezes    Heart: Regular rate and rhythm; No murmurs appreciated   Extremities:  Radial pulses (+); No edema  Skin: Central line right chest with no circumferential erythema or purulence  Musculoskeletal: No deformities; No swelling; No tenderness  Neurological: Alert and oriented    Results for orders placed or performed during the hospital encounter of 08/26/17   COMPREHENSIVE METABOLIC PANEL, NON-FASTING   Result Value Ref Range    SODIUM 138 137 - 145 mmol/L    POTASSIUM 3.7 3.5 - 5.1 mmol/L    CHLORIDE 103 98 - 107 mmol/L    CO2 TOTAL 29 22 - 32 mmol/L    ANION GAP 6 (L) 10 - 20 mmol/L    BUN 7 (L) 8 - 25 mg/dL    CREATININE 0.70 0.49 - 1.10 mg/dL    BUN/CREA RATIO 10     ESTIMATED GFR 94 mL/min/1.75m^2    ALBUMIN 4.3 3.5 - 5.0 g/dL    CALCIUM 9.7 8.5 - 10.4 mg/dL    GLUCOSE 95 70 - 105 mg/dL    ALKALINE PHOSPHATASE 120 38 - 126 U/L    ALT (SGPT) 47 (H) <=35 U/L  AST (SGOT) 48 (H) 14 - 36 U/L    BILIRUBIN TOTAL 0.2 (L) 0.3 - 1.3 mg/dL    PROTEIN TOTAL 7.5 6.4 - 8.3 g/dL   LACTIC ACID LEVEL   Result Value Ref Range    LACTIC ACID 0.9 0.7 - 2.1 mmol/L   CBC WITH DIFF   Result Value Ref Range    WBC 8.3 3.7 - 11.0 x10^3/uL    RBC 4.13 3.85 - 5.22 x10^6/uL    HGB 11.8 11.5 - 16.0 g/dL    HCT 35.7 34.8 - 46.0 %    MCV 86.4 78.0 - 100.0 fL    MCH 28.6 26.0 - 32.0 pg    MCHC 33.1 31.0 - 35.5 g/dL    RDW-CV 12.8 11.5 - 15.5 %    PLATELETS 296 150 - 400 x10^3/uL    MPV 9.1 8.7 - 12.5 fL   BLUE TOP TUBE   Result Value Ref Range    RAINBOW/EXTRA TUBE AUTO RESULT Yes    GOLD TOP TUBE   Result Value Ref Range    RAINBOW/EXTRA TUBE AUTO RESULT Yes    MANUAL DIFF AND MORPHOLOGY-SYSMEX   Result Value Ref Range    NEUTROPHIL % 69 %    LYMPHOCYTE %  26 %    MONOCYTE % 3 %    EOSINOPHIL % 0 %    BASOPHIL % 0 %    REACTIVE LYMPHOCYTE % 2 %    NEUTROPHIL # 5.73 1.50 - 7.70 x10^3/uL    LYMPHOCYTE # 2.32 1.00 - 4.80 x10^3/uL    MONOCYTE # 0.25 0.20 - 1.10 x10^3/uL    EOSINOPHIL # <0.04 <=0.50 x10^3/uL    BASOPHIL # <0.04 <=0.20 x10^3/uL    RBC MORPHOLOGY Normal RBC and PLT Morphology      CXR with no infiltrate  or abnormality appreciated    Assessment and Plan    ENCOUNTER DIAGNOSES     ICD-10-CM   1. Central line infection T80.219A   2. Coagulase negative Staphylococcus bacteremia R78.81     Course of care:  Blood cultures reviewed; blood culture from central line positive 5 hours prior to peripheral blood culture.  Dr. Idamae Lusher contacted; patient began vancomycin therapy be admitted with consult to Dr. Myriam Jacobson for line removal.  Dr. Myriam Jacobson made aware of consult.    Orders Placed This Encounter   . MOBILE CHEST X-RAY   . CBC/DIFF   . COMPREHENSIVE METABOLIC PANEL, NON-FASTING   . LACTIC ACID LEVEL   . CBC WITH DIFF   . EXTRA TUBES - [facility]   . BLUE TOP TUBE   . GOLD TOP TUBE   . MANUAL DIFF AND MORPHOLOGY-SYSMEX   . VANCOMYCIN, TROUGH   . PATIENT CLASS/LEVEL OF CARE DESIGNATION   . ondansetron (ZOFRAN) 2 mg/mL injection   . NS premix infusion   . vancomycin (VANCOCIN) 1,000 mg in NS 250 mL IVPB   . HYDROmorphone (DILAUDID) 1 mg/mL injection   . NS premix infusion   . ondansetron (ZOFRAN) 2 mg/mL injection   . HYDROmorphone (DILAUDID) 1 mg/mL injection   . vancomycin (VANCOCIN) 1,000 mg in NS 250 mL IVPB   . Vancomycin IV - Pharmacist to Dose per Protocol     Michaela Corner, MD

## 2017-08-27 NOTE — Nurses Notes (Signed)
Patient moved to a private room and voiced immediate relief of anxiety and was very thankful.

## 2017-08-27 NOTE — Anesthesia Preprocedure Evaluation (Signed)
ANESTHESIA PRE-OP EVALUATION  Planned Procedure: REMOVAL INFUSAPORT (Left )  Review of Systems    PONV       patient summary reviewed  nursing notes reviewed        Pulmonary   smoked in last 24hours,   Cardiovascular  negative cardio ROS,   ECG reviewed No peripheral edema,  Exercise Tolerance: > or = 4 METS        GI/Hepatic/Renal    "[place] esophagus" and esophageal disease     Endo/Other   neg endo/other ROS,        Neuro/Psych/MS    Ehler-Danlos syndrome, headaches, back abnormality, fibromyalgia and anxiety    peripheral neuropathy,  Cancer  negative hematology/oncology ROS,                    Physical Assessment      Patient summary reviewed and Nursing notes reviewed   Airway       Mallampati: III    TM distance: >3 FB    Neck ROM: limited  Mouth Opening: poor.  No Facial hair  No Beard  No endotracheal tube present  No Tracheostomy present    Dental       Dentition intact             Pulmonary    Breath sounds clear to auscultation  (-) no rhonchi, no decreased breath sounds, no wheezes, no rales and no stridor     Cardiovascular    Rhythm: regular  Rate: Normal  (-) no friction rub, carotid bruit is not present, no peripheral edema and no murmur     Other findings            Plan  Planned anesthesia type: MAC    ASA 3     Intravenous induction     Anesthetic plan and risks discussed with patient.     Anesthesia issues/risks discussed are: PONV, Post-op Cognitive Dysfunction, Dental Injuries, Stroke, Intraoperative Awareness/ Recall, Aspiration, Sore Throat, Difficult Airway, Post-op Intubation/Ventilation, Post-op Agitation/Tantrum and Cardiac Events/MI.        Patient's NPO status is appropriate for Anesthesia.

## 2017-08-27 NOTE — H&P (Signed)
[facility]  General Medicine  Admission H&P    Date of Service:  08/27/2017  Holly, Fritz y.o. female  Date of Admission:  08/26/2017  Date of Birth:  04/29/1980  PCP: Eddie Dibbles, MD    LAY CAREGIVER   Appointed Lay Caregiver?: I Decline     Information Obtained from: patient   Chief Complaint:  Bacteremia  HPI: Holly Fritz is a 37 y.o., [place] female who presents with with a central line that has been there for 2 years she suffers from a Ehlers-Danlos syndrome and other medical issues.  Was in the hospital in November after that she has not used her central line much other than do some blood draws.  She actually presented to me in the ER where she was asymptomatic but she wanted her central line area cleaned and checked we drew blood cultures and both sites have been positive for Staph coagulase negative.  Patient declines any chest pain chest tightness no shortness of breath no abdominal pain no nausea vomiting she suffers from chronic bone pain but she has noticed for the last 1 week that her back hurts a little more.  Declines any fever chills no headache no dizziness.    PAST MEDICAL:    Past Medical History:   Diagnosis Date   . Autoimmune disorder (CMS HCC)    . Chiari I malformation (CMS HCC)    . Chronic pain    . Convergence insufficiency    . Dysautonomia (CMS [place])    . Ehlers-Danlos syndrome    . Ehlers-Danlos syndrome    . Gastroparesis    . Headache         Past Surgical History:   Procedure Laterality Date   . ESOPHAGEAL DILATION     . HX MYOMECTOMY     . HX WISDOM TEETH EXTRACTION     . POSTERIOR FOSSA DECOMPRESSION              Medications Prior to Admission     Prescriptions    aspirin-acetaminophen-caffeine (EXCEDRIN) 250-250-65 mg Oral Tablet    Take 2 Tabs by mouth Every 6 hours as needed for Migraine    baclofen (LIORESAL) 10 mg Oral Tablet    Take 10 mg by mouth Every 72 hours as needed    capsaicin (ZOSTRIX) 0.025 % Cream    by Apply Topically route Twice  daily    cetirizine (ZYRTEC) 10 mg Oral Tablet    Take 10 mg by mouth Once a day    cholecalciferol, vitamin D3, 1,000 unit Oral Tablet    Take 1,000 Units by mouth Once a day    diphenhydrAMINE (BENADRYL) 25 mg Oral Capsule    Take 25 mg by mouth Once per day as needed    hydrOXYzine pamoate (VISTARIL) 50 mg Oral Capsule    Take 50 mg by mouth Three times a day as needed for Itching    Ibuprofen (MOTRIN) 200 mg Oral Tablet    Take 200 mg by mouth Four times a day as needed for Pain    meclizine (ANTIVERT) 25 mg Oral Tablet    Take 2 Tabs (50 mg total) by mouth Twice per day as needed for Dizziness    methocarbamol (ROBAXIN) 750 mg Oral Tablet    Take 750 mg by mouth Once per day as needed    mirtazapine (REMERON) 30 mg Oral Tablet    Take 30 mg by mouth Every night    naproxen (  NAPROSYN) 250 mg Oral Tablet    Take 220 mg by mouth Once per day as needed for Pain    OLANZapine (ZYPREXA) 10 mg Oral Tablet    Take 10 mg by mouth Every night    OLANZapine (ZYPREXA) 5 mg Oral Tablet    Take 5 mg by mouth Every morning    ondansetron (ZOFRAN ODT) 4 mg Oral Tablet, Rapid Dissolve    Take 4 mg by mouth Every 8 hours as needed for nausea/vomiting    pantoprazole (PROTONIX) 40 mg Oral Tablet, Delayed Release (E.C.)    Take 40 mg by mouth Twice daily    raNITIdine (ZANTAC) 150 mg Oral Tablet    Take 150 mg by mouth Once per day as needed    sodium chloride (SALINE NOSE NASL)    by Nasal route    traZODone (DESYREL) 150 mg Oral Tablet    Take 150 mg by mouth Every night    vitamin A (AQUASOL A) 10,000 unit Oral Capsule    Take 10,000 Units by mouth Once a day        Allergies   Allergen Reactions   . Adhesive Rash   . Doxycycline    . Penicillins        Family History  Family Medical History:     None              Social History  Social History     Tobacco Use   . Smoking status: Current Every Day Smoker     Packs/day: 1.50     Years: 20.00     Pack years: 30.00   . Smokeless tobacco: Never Used   Substance Use Topics   .  Alcohol use: Not on file          ROS: all point ROS negative other than mentioned above  Examination:  Temperature: 37 C (98.6 F) Heart Rate: 62 BP (Non-Invasive): (!) 92/51   Respiratory Rate: 18 SpO2: 96 % Pain Score (Numeric, Faces): 3   Constitutional:  appears in good health  Eyes:  Conjunctiva clear.  ENT:  Ear canals normal.  Neck:  supple, symmetrical, trachea midline  Respiratory:  Clear to auscultation and percussion bilaterally.   Cardiovascular:  regular rate and rhythm, S1, S2 normal, no murmur, click, rub or gallop I did not auscultate any murmurs  Gastrointestinal:  Soft, non-tender no guarding no rigidity no rebound tenderness  Musculoskeletal:  Able to move extremities no obvious weakness noted  Integumentary:  Skin warm and dry, No obvious  rashes   Neurologic:  Grossly normal, CN II - XII grossly intact , Normal mental status, sensory, motor, cranial nerves, , coordination, , Alert and oriented x3, No tremor, No asterixis  Lymphatic/Immunologic/Hematologic:  Cervical, ,  nodes normal.  Psychiatric:  Normal affect, behavior, memory, thought content, judgement, and speech.  Exam is fairly benign no point tenderness along the back.  Labs:    BMP  (Last 24 hours)    Date/Time Na K Cl CO2 BUN CREAT Calcium Glucose    08/26/17 2134 138    3.7    103    29    7 (L)    0.70    9.7    95         Hepatic Function  (Last 24 hours)    Date/Time Albumin Total PTN Total Bili Direct Bili AST/SGOT ALT/SGPT Alk Phos    08/26/17 2134 4.3    7.5  0.2 (L)    -- 48 (H)    47 (H)    120           CBC  (Last 24 hours)    Date/Time WBC HGB HCT MCV PLATELETS    08/26/17 2134 8.3    11.8    35.7    86.4    296         WBC/Diff  (Last 24 hours)    Date/Time WBC Bands PMNs Lymphs Mono Eos Basos    08/26/17 2134 8.3    -- -- -- -- -- --    08/26/17 2134 -- -- 69    26    3    0    0              PT:  No results found for this encounter  INR:   No results found for this encounter  PTT:   No results found for this  encounter  Most Recent Cardiac Markers:  No results found for this encounter  Routine U/A:   No results found for this encounter  Thyroid:  No results found for this encounter    Imaging Studies:   CXR:  Negative  KUB:    Code Status:  Full Code    Assessment/Plan:   Active Hospital Problems    Diagnosis   . Primary Problem: Bacteremia due to coagulase-negative Staphylococcus   . Chiari I malformation (CMS HCC)   . Ehlers-Danlos syndrome   . Gastroparesis     Since Staph coagulase negative is a very slow growing bacteria and patient has been asymptomatic be really do not know how long she has been having this bacteremia from the central line.  It could have been weeks or could have been going on for months.  I explained to her the workup for bacteremia which includes getting an echocardiogram and since she has back pain and this is a slow growing bacteria am going to scan her back to make sure there is no early osteomyelitis or a spinal abscess also do a CT chest CT abdomen pelvis to complete the workup.  Since she has allergies to doxycycline Cipro and she says she has some allergy to penicillin she is not sure though she has taken Keflex in the past but she does not remember taking amoxicillin I am going to continue on the IV vancomycin for now.  I have involved surgery for removing the central line.  Once a get all this workup done I will have a discussion with Infectious Disease specialist at [facility] to decide on the next plan of care in terms of discharge planning and long-term antibiotics.  Plan has been discussed with the patient in detail she understands agrees with the plan and management.  High-level history and physical full admit.    DVT/PE Prophylaxis: Enoxaparin    Dyanne Iha, MD    Medical Decision Making:    Problem: There is at least one new, significant, and acute issue with additional workup and management planned. Patient has comorbidity contributing to the  complexity of this case.    Data:   All applicable labs tests, EKG tracings, and radiographs have been reviewed, directly visualized and independently interpreted reviewed by me. Selected old records, if applicable, have been reviewed and summarized. I ordered multiple additional tests.     Risk: Risk of complications and/or morbidity or mortality is HIGH.

## 2017-08-27 NOTE — OR Surgeon (Signed)
[place], [place]  Operation Summary            Patient Name:  Holly Fritz  Patient MRN:  [id number]  Patient DOB:  [birth year]  Date of Service:  08/27/2017    PREOPERATIVE DIAGNOSIS:     1. Infection involving tunneled right internal jugular central venous catheter    POSTOPERATIVE DIAGNOSES:  Same as above  NAME OF PROCEDURE:  1. Removal of tunneled right internal jugular central venous catheter      OPERATIVE SUMMARY:  Patient was taken to the operating theater placed in a supine position her right neck chest and shoulder were draped and prepped in a sterile manner mac anesthesia was initiated.  A time-out was performed.  Using a mixture of half and half 1% lidocaine with 0.25% Marcaine we anesthetized around her catheter exit site and going along the subcutaneous tunnel with approximately 4 cc of our local anesthetic then using a hemostat we gently diet dissected around the catheter up to the Dacron cuff which were able to grasp and pull through the skin through our catheter exit site afterwards we applied pressure for several minutes and a sterile dressing was placed over the exit site patient tolerated the procedure well.  Was transferred back to her room.    ATTENDING SURGEON:  Ples Specter. Myriam Jacobson, MD  ASSISTANT SURGEON: None  ANESTHESIA:  Mac and local anesthetic  COMPLICATIONS:  None.       Audria Nine, MD  08/27/2017, 12:49

## 2017-08-27 NOTE — Anesthesia Postprocedure Evaluation (Signed)
Anesthesia Post Op Evaluation    Patient: Keyari Leigh [place]  Procedure(s) with comments:  REMOVAL INFUSAPORT - Removal of Central Line    Last Vitals:Temperature: 36.8 C (98.2 F) (08/27/17 1303)  Heart Rate: 65 (08/27/17 1303)  BP (Non-Invasive): 96/66 (08/27/17 1303)  Respiratory Rate: 16 (08/27/17 1303)  SpO2: 95 % (08/27/17 1303)  Pain Score (Numeric, Faces): 6 (08/27/17 1210)  Patient is sufficiently recovered from the effects of anesthesia to participate in the evaluation and has returned to their pre-procedure level.  Patient location during evaluation: PACU   Post-procedure handoff checklist completed    Patient participation: complete - patient participated  Level of consciousness: awake and alert and responsive to verbal stimuli  Pain score: 0  Pain management: adequate  Airway patency: patent  Anesthetic complications: no  Cardiovascular status: acceptable  Respiratory status: acceptable  Hydration status: acceptable  Patient post-procedure temperature: Pt Normothermic   PONV Status: Absent

## 2017-08-27 NOTE — Consults (Signed)
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  The catheter had been used for hyperalimentation for much of that to year.  But has not been used in some time during a recent coming to the our emergency room she had cultures taken from the line which showed her to have some Staph coagulase negative positive blood culture.  She denies any fevers chills sweats    PMH:  Autoimmune disorder  Chronic pain  Gastrointestinal paresis    PSH:  Esophageal dilatation  Esophageal myomectomy  Posterior fossa decompression    Allergies:  Adhesive tape Rash  Doxycycline  Penicillin    SAH:  30 pack year smoking history smokes 1 and half packs of cigarettes per day    ROS 12 point was performed no additional findings    PE:  VS:BP (!) 88/50   Pulse 69   Temp 36.9 C (98.4 F)   Resp 18   Ht 1.626 m (5\' 4" )   Wt 57.4 kg (126 lb 8 oz)   SpO2 96%   BMI 21.71 kg/m     HEENT:  Within normal limits  Chest:  Clear to auscultation and percussion she does have a double-lumen catheter to the right anterior chest wall below the clavicle that is her Hickman catheter no surrounding redness or erythema equal bilateral breath sounds    CV:  Rate and rhythm regular with normal S1 and S2 without S3 source murmurs or rubs    ABD:  Soft positive bowel sounds  Extremities:  Moves all extremities on command    Labs:Results for Holly, Fritz (MRN [id number]) as of 08/27/2017 12:46   Ref. Range 08/26/2017 21:34   WBC Latest Ref Range: 3.7 - 11.0 x10^3/uL 8.3   HGB Latest Ref Range: 11.5 - 16.0 g/dL 11.8   HCT Latest Ref Range: 34.8 - 46.0 % 35.7   PLATELET COUNT Latest Ref Range: 150 - 400 x10^3/uL 296   RBC Latest Ref Range: 3.85 - 5.22 x10^6/uL 4.13   MCV Latest Ref Range: 78.0 - 100.0 fL 86.4   MCHC Latest Ref Range: 31.0 - 35.5 g/dL 33.1   MCH Latest Ref  Range: 26.0 - 32.0 pg 28.6   RDW-CV Latest Ref Range: 11.5 - 15.5 % 12.8   MPV Latest Ref Range: 8.7 - 12.5 fL 9.1   PMN'S Latest Units: % 69   LYMPHOCYTES Latest Units: % 26   EOSINOPHIL Latest Units: % 0   MONOCYTES Latest Units: % 3   BASOPHILS Latest Units: % 0   REACTIVE LYMPHOCYTE % Latest Units: % 2   PMN ABS Latest Ref Range: 1.50 - 7.70 x10^3/uL 5.73   LYMPHS ABS Latest Ref Range: 1.00 - 4.80 x10^3/uL 2.32   EOS ABS Latest Ref Range: <=0.50 x10^3/uL <0.04   MONOS ABS Latest Ref Range: 0.20 - 1.10 x10^3/uL 0.25   BASOS ABS Latest Ref Range: <=0.20 x10^3/uL <0.04   RBC MORPHOLOGY Unknown Normal RBC and PLT Morphology   SODIUM Latest Ref Range: 137 - 145 mmol/L 138   POTASSIUM Latest Ref Range: 3.5 - 5.1 mmol/L 3.7   CHLORIDE Latest Ref Range: 98 - 107 mmol/L 103   CARBON DIOXIDE Latest Ref Range: 22 - 32 mmol/L 29   BUN Latest Ref Range: 8 - 25 mg/dL 7 (L)   CREATININE  Latest Ref Range: 0.49 - 1.10 mg/dL 0.70   GLUCOSE Latest Ref Range: 70 - 105 mg/dL 95   ANION GAP Latest Ref Range: 10 - 20 mmol/L 6 (L)   BUN/CREAT RATIO Unknown 10   ESTIMATED GLOMERULAR FILTRATION RATE Latest Units: mL/min/1.20m^2 94   CALCIUM Latest Ref Range: 8.5 - 10.4 mg/dL 9.7   TOTAL PROTEIN Latest Ref Range: 6.4 - 8.3 g/dL 7.5   ALBUMIN Latest Ref Range: 3.5 - 5.0 g/dL 4.3   BILIRUBIN, TOTAL Latest Ref Range: 0.3 - 1.3 mg/dL 0.2 (L)   AST (SGOT) Latest Ref Range: 14 - 36 U/L 48 (H)   ALT (SGPT) Latest Ref Range: <=35 U/L 47 (H)   ALKALINE PHOSPHATASE Latest Ref Range: 38 - 126 U/L 120     A/P 1. Positive both blood cultures from 7 the 13 2000 and 72 SH is staff o'clock this some coagulase-negative indicating there is infection involving the Hickman catheter.  I have discussed with the patient today the need for removal of the Hickman catheter and these in and the risk associated with removal in these include but are not limited to the risk of infection cardiac dysrhythmia MI DVT PE MI CVA the risk of further systemic a blood in his  infection related to the fibrin sheath that will probably still however the area and even the risk of death and the patient has agreed to surgical intervention her husband was in the room at the time of consenting the patient.

## 2017-08-27 NOTE — Nurses Notes (Signed)
Patient taken to the OR in stable condition with Jannett Celestine CRNA and Ginette Pitman CRNA.

## 2017-08-28 ENCOUNTER — Encounter (HOSPITAL_COMMUNITY): Payer: Self-pay

## 2017-08-28 LAB — CBC WITH DIFF
HCT: 31.3 % — ABNORMAL LOW (ref 34.8–46.0)
HGB: 10.5 g/dL — ABNORMAL LOW (ref 11.5–16.0)
MCH: 28.8 pg (ref 26.0–32.0)
MCHC: 33.5 g/dL (ref 31.0–35.5)
MCV: 85.8 fL (ref 78.0–100.0)
MPV: 9.6 fL (ref 8.7–12.5)
PLATELETS: 223 x10ˆ3/uL (ref 150–400)
RBC: 3.65 x10ˆ6/uL — ABNORMAL LOW (ref 3.85–5.22)
RDW-CV: 12.8 % (ref 11.5–15.5)
WBC: 7.2 x10ˆ3/uL (ref 3.7–11.0)

## 2017-08-28 LAB — COMPREHENSIVE METABOLIC PNL, FASTING
ALBUMIN: 3.3 g/dL — ABNORMAL LOW (ref 3.5–5.0)
ALKALINE PHOSPHATASE: 112 U/L (ref 38–126)
ALT (SGPT): 37 U/L — ABNORMAL HIGH (ref <=35)
ANION GAP: 5 mmol/L — ABNORMAL LOW (ref 10–20)
AST (SGOT): 35 U/L (ref 14–36)
BILIRUBIN TOTAL: 0.2 mg/dL — ABNORMAL LOW (ref 0.3–1.3)
BUN/CREA RATIO: 13
BUN: 9 mg/dL (ref 8–25)
CALCIUM: 8.2 mg/dL — ABNORMAL LOW (ref 8.5–10.4)
CHLORIDE: 114 mmol/L — ABNORMAL HIGH (ref 98–107)
CO2 TOTAL: 24 mmol/L (ref 22–32)
CREATININE: 0.7 mg/dL (ref 0.49–1.10)
ESTIMATED GFR: 94 mL/min/1.73mˆ2
GLUCOSE: 90 mg/dL (ref 70–105)
POTASSIUM: 3.9 mmol/L (ref 3.5–5.1)
PROTEIN TOTAL: 5.9 g/dL — ABNORMAL LOW (ref 6.4–8.3)
SODIUM: 143 mmol/L (ref 137–145)

## 2017-08-28 LAB — LACTIC ACID LEVEL: LACTIC ACID: 0.8 mmol/L (ref 0.7–2.1)

## 2017-08-28 LAB — C-REACTIVE PROTEIN(CRP),INFLAMMATION: CRP INFLAMMATION: 6 mg/L (ref <=8.0)

## 2017-08-28 LAB — VANCOMYCIN, TROUGH: VANCOMYCIN TROUGH: 10.9 ug/mL — ABNORMAL HIGH (ref 5.0–10.0)

## 2017-08-28 LAB — MAGNESIUM: MAGNESIUM: 1.9 mg/dL (ref 1.6–2.5)

## 2017-08-28 MED ORDER — SODIUM CHLORIDE 0.9 % INTRAVENOUS SOLUTION
1250.00 mg | Freq: Two times a day (BID) | INTRAVENOUS | Status: DC
Start: 2017-08-28 — End: 2017-08-29
  Administered 2017-08-28 – 2017-08-29 (×2): 1250 mg via INTRAVENOUS
  Administered 2017-08-29 (×2): 0 mg via INTRAVENOUS
  Filled 2017-08-28 (×4): qty 25

## 2017-08-28 MED ADMIN — furosemide 40 mg tablet: TRANSDERMAL | @ 08:00:00

## 2017-08-28 MED ADMIN — potassium chloride 20 mEq/L in dextrose 5 %-0.45 % sodium chloride IV: TOPICAL | @ 08:00:00 | NDC 00338067104

## 2017-08-28 NOTE — Progress Notes (Signed)
Date:  08/28/2017  Name:  Holly Fritz, Holly Fritz  MRN: [id number]    S:  Patient complains of some moderate tenderness around exit site just above the exit site of her central venous catheter that was removed.    O:BP 109/76   Pulse 66   Temp 36.8 C (98.3 F)   Resp 18   Ht 1.626 m (5\' 4" )   Wt 57.4 kg (126 lb 8 oz)   SpO2 97%   BMI 21.71 kg/m     Chest:  Clear to auscultation and percussion no rales rhonchi or wheezes there is a small amount of bruising about an inch and from the exit site of her tunneled central venous catheter which I explained to her and her husband are related to [place] reaching up around the catheter and grabbing the Dacron cuff and applying tension to it to separated from the surrounding scar tissue that it grows into.  So some tenderness in that area is to be expected.    Results for Holly, Fritz (MRN [id number]) as of 08/28/2017 09:54   Ref. Range 08/28/2017 06:09   WBC Latest Ref Range: 3.7 - 11.0 x10^3/uL 7.2   HGB Latest Ref Range: 11.5 - 16.0 g/dL 10.5 (L)   HCT Latest Ref Range: 34.8 - 46.0 % 31.3 (L)   PLATELET COUNT Latest Ref Range: 150 - 400 x10^3/uL 223   RBC Latest Ref Range: 3.85 - 5.22 x10^6/uL 3.65 (L)   MCV Latest Ref Range: 78.0 - 100.0 fL 85.8   MCHC Latest Ref Range: 31.0 - 35.5 g/dL 33.5   MCH Latest Ref Range: 26.0 - 32.0 pg 28.8   RDW-CV Latest Ref Range: 11.5 - 15.5 % 12.8   MPV Latest Ref Range: 8.7 - 12.5 fL 9.6   SODIUM Latest Ref Range: 137 - 145 mmol/L 143   POTASSIUM Latest Ref Range: 3.5 - 5.1 mmol/L 3.9   CHLORIDE Latest Ref Range: 98 - 107 mmol/L 114 (H)   CARBON DIOXIDE Latest Ref Range: 22 - 32 mmol/L 24   BUN Latest Ref Range: 8 - 25 mg/dL 9   CREATININE Latest Ref Range: 0.49 - 1.10 mg/dL 0.70   GLUCOSE Latest Ref Range: 70 - 105 mg/dL 90   ANION GAP Latest Ref Range: 10 - 20 mmol/L 5 (L)   BUN/CREAT RATIO Unknown 13   ESTIMATED GLOMERULAR FILTRATION RATE Latest Units: mL/min/1.90m^2 94   CALCIUM Latest Ref Range: 8.5 - 10.4 mg/dL 8.2 (L)   MAGNESIUM  Latest Ref Range: 1.6 - 2.5 mg/dL 1.9   TOTAL PROTEIN Latest Ref Range: 6.4 - 8.3 g/dL 5.9 (L)   ALBUMIN Latest Ref Range: 3.5 - 5.0 g/dL 3.3 (L)   BILIRUBIN, TOTAL Latest Ref Range: 0.3 - 1.3 mg/dL 0.2 (L)   AST (SGOT) Latest Ref Range: 14 - 36 U/L 35   ALT (SGPT) Latest Ref Range: <=35 U/L 37 (H)   ALKALINE PHOSPHATASE Latest Ref Range: 38 - 126 U/L 112   LACTIC ACID Latest Ref Range: 0.7 - 2.1 mmol/L 0.8     A/P:  Discussed with the patient and her husband on the risks that with the removal of the catheter to will be an area of tenderness more proximal than the exit site related to dislodgement of the Dacron cuff of the catheter from the surrounding scar tissue.  They seemed to be comfortable with the explanation I also let Dr. Idamae Lusher know that I talked to the family.  From a surgical standpoint there is no  need for further general surgical follow-up.

## 2017-08-28 NOTE — Progress Notes (Signed)
[facility]  Medicine Progress Note    Holly Fritz  Date of service: 08/28/2017  Date of Admission:  08/26/2017    Hospital Day:  LOS: 2 days   Subjective:  Patient is doing well no spike a fever no chest pain no chest tightness no shortness of breath.  She had some pain around her central line side but Dr. Myriam Jacobson looked at it and that looks okay.  She is resting comfortably.  Vital Signs:  Temp  Avg: 36.7 C (98.1 F)  Min: 36.4 C (97.6 F)  Max: 36.9 C (98.4 F)    Pulse  Avg: 60.8  Min: 52  Max: 70 BP  Min: 109/76  Max: 124/77   Resp  Avg: 17.8  Min: 17  Max: 18 SpO2  Avg: 96.8 %  Min: 96 %  Max: 98 %   Pain Score (Numeric, Faces): 5      Input/Output    Intake/Output Summary (Last 24 hours) at 08/28/2017 1457  Last data filed at 08/28/2017 0639  Gross per 24 hour   Intake 600 ml   Output --   Net 600 ml    I/O last shift:  No intake/output data recorded.     Current Facility-Administered Medications:  capsaicin (ZOSTRIX) 0.025 % topical cream  Apply Topically 2x/day   cetirizine (ZYRTEC) tablet 10 mg Oral Daily   cholecalciferol (VITAMIN D3) tablet 1,000 Units Oral Daily   diphenhydrAMINE (BENADRYL) capsule 25 mg Oral Daily PRN   enoxaparin (LOVENOX) 40 mg/0.4 mL SubQ injection 40 mg Subcutaneous Daily   HYDROmorphone (DILAUDID) 1 mg/mL injection 0.5 mg Intravenous Q4H PRN   hydrOXYzine pamoate (VISTARIL) capsule 50 mg Oral 3x/day PRN   ibuprofen (MOTRIN) tablet 200 mg Oral 4x/day PRN   meclizine (ANTIVERT) tablet 50 mg Oral 2x/day PRN   methocarbamol (ROBAXIN) tablet 750 mg Oral Daily PRN   mirtazapine (REMERON) tablet 30 mg Oral NIGHTLY   nicotine (NICODERM CQ) transdermal patch (mg/24 hr) 21 mg Transdermal Daily   NS flush syringe 3 mL Intracatheter Q8HRS   And      NS flush syringe 3 mL Intracatheter Q1 MIN PRN   OLANZapine (ZYPREXA) tablet 10 mg Oral NIGHTLY   OLANZapine (ZYPREXA) tablet 5 mg Oral QAM   ondansetron (ZOFRAN ODT) rapid dissolve tablet 4 mg Oral Q8H PRN   ondansetron  (ZOFRAN) 2 mg/mL injection 4 mg Intravenous Q6H PRN   pantoprazole (PROTONIX) delayed release tablet 40 mg Oral 2x/day   traZODone (DESYREL) tablet 150 mg Oral NIGHTLY   vancomycin (VANCOCIN) 1,250 mg in NS 250 mL IVPB 1,250 mg Intravenous Q12H   Vancomycin IV - Pharmacist to Dose per Protocol  Does not apply Daily PRN   vitamin A (AQUASOL A) capsule 10,000 Units Oral Daily       Physical Exam:  Constitutional:  appears in average health  Eyes:  Conjunctiva clear.  ENT:  Ear canals normal.  Neck:  supple, symmetrical, trachea midline  Respiratory:  Clear to auscultation and percussion bilaterally.   Cardiovascular:  regular rate and rhythm, S1, S2 normal, no murmur, click, rub or gallop  Gastrointestinal:  Soft, non-tender no guarding no rigidity no rebound tenderness  Musculoskeletal:  Able to move extremities no obvious weakness noted  Integumentary:  Skin warm and dry, No rashes and No lesions  Neurologic:  Grossly normal, CN II - XII grossly intact , Normal mental status, sensory, motor, cranial nerves, , coordination, , Alert and oriented x3, No tremor, No asterixis  Lymphatic/Immunologic/Hematologic:  Cervical, supraclavicular,  nodes normal.  Psychiatric:  Normal affect, behavior, memory, thought content, judgement, and speech.  Central line is out the site looks pretty good  Labs:  BMP  (Last 24 hours)    Date/Time Na K Cl CO2 BUN CREAT Calcium Glucose    08/28/17 0609 143    3.9    114 (H)    24    9    0.70    8.2 (L)    90         Hepatic Function  (Last 24 hours)    Date/Time Albumin Total PTN Total Bili Direct Bili AST/SGOT ALT/SGPT Alk Phos    08/28/17 0609 3.3 (L)    5.9 (L)    0.2 (L)    -- 35    37 (H)    112           CBC  (Last 24 hours)    Date/Time WBC HGB HCT MCV PLATELETS    08/28/17 0609 7.2    10.5 (L)    31.3 (L)    85.8    223         WBC/Diff  (Last 24 hours)    Date/Time WBC Bands PMNs Lymphs Mono Eos Basos    08/28/17 0609 7.2    -- -- -- -- -- --            Radiology:  Different scans  CT scans were all reviewed        Assessment/ Plan:   Active Hospital Problems    Diagnosis   . Primary Problem: Bacteremia due to coagulase-negative Staphylococcus   . Chiari I malformation (CMS HCC)   . Ehlers-Danlos syndrome   . Gastroparesis     Echocardiogram looked good.  CT abdomen pelvis CT chest CT back cervical spine all looks fine.  Patient has been afebrile white blood cell count has been normal.  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  From surgery in the past but she does not have any other hardware like a joint replacement or a pacemaker.  Patient has been asymptomatic so will go ahead and repeat the blood culture is and see if there is any regrowth of the bacteria.  If the bacteremia is resolved will continue with another 48 hr for a total 72 hr of IV vancomycin and then the patient should be ready to go home.  Plan has been discussed with the patient the patient understands agrees.      Moderate level progress note    Dyanne Iha, MD

## 2017-08-28 NOTE — Nurses Notes (Signed)
Patient resting, she complains of it being warm in her room. She refused to have her cream on her back tonight stated that it was really hot. Patient denies any pain at this time. Will continue to monitor.

## 2017-08-28 NOTE — Care Plan (Signed)
Problem: Adult Inpatient Plan of Care  Goal: Plan of Care Review  Outcome: Outcome Achieved  Goal: Patient-Specific Goal (Individualization)  Outcome: Outcome Achieved  Goal: Absence of Hospital-Acquired Illness or Injury  Outcome: Outcome Achieved  Goal: Optimal Comfort and Wellbeing  Outcome: Outcome Achieved  Goal: Rounds/Family Conference  Outcome: Outcome Achieved     Problem: Depression  Goal: Improved Mood  Outcome: Outcome Achieved     Problem: Fall Injury Risk  Goal: Absence of Fall and Fall-Related Injury  Outcome: Outcome Achieved     Patient is A&Ox4. Skin is pink, warm, dry. Lungs are clear and equal bilaterally with bowel sounds present in all 4 quads. Patient is very anxious and she was requesting to speak with the chaplain. Patient dressing is clean dry and intact. Patient is trying to sleep throughout the night. Pedal pulses present. Will continue to monitor patient.

## 2017-08-28 NOTE — Nurses Notes (Signed)
Patient and her husband are up ambulating in the halls.

## 2017-08-28 NOTE — Ancillary Notes (Signed)
Patient requested a consult to deliver her food likes and dislikes. She was given a menu "available at all meals" to use if she does not like the food served. Patient and husband ambulated to dining room for lunch today and she gave dietary her food preferences for supper.  Continue to monitor

## 2017-08-28 NOTE — Care Plan (Signed)
Patient resting in bed with eyes closed. She requested to be able to sleep after dinner with no interruptions, room darkened and signs placed on the door to not disturb. She was given Dilaudid and Zofran twice today for head/neck pain and preventing nausea. She was less anxious today despite still not resting well over night. She was not tearful today. Husband was able to get her out of the room and ate in the cafeteria. IV fluids discontinued which the patient was pleased with. Dressing to the right chest wall is CDI, the patient does express that it is sore and hurting her, Dr. Myriam Jacobson and Dr. Idamae Lusher are aware. Respirations even and unlabored on RA. Denies any needs or complaints. Call light is in reach.

## 2017-08-28 NOTE — Progress Notes (Signed)
Pharmacy consulted to dose Vancomycin for this 37 yo female patient with central line infection.  Vancomycin trough drawn today was under goal.  It appears that the drug has been administered correctly and that the trough was drawn correctly.  As such, we will increase the dose to Vancomycin 1.25gm IV q12hr with the first dose being tonight, 08/28/17, at 2300.  We will repeat the trough on Saturday, 08/30/17 @ 1000.  Pharmacy will continue to follow and adjust as clinically indicated.

## 2017-08-29 ENCOUNTER — Inpatient Hospital Stay (HOSPITAL_COMMUNITY): Payer: BC Managed Care – PPO | Admitting: Radiology

## 2017-08-29 LAB — CBC WITH DIFF
HCT: 28.4 % — ABNORMAL LOW (ref 34.8–46.0)
HGB: 9.7 g/dL — ABNORMAL LOW (ref 11.5–16.0)
MCH: 29.3 pg (ref 26.0–32.0)
MCHC: 34.2 g/dL (ref 31.0–35.5)
MCV: 85.8 fL (ref 78.0–100.0)
MPV: 9.5 fL (ref 8.7–12.5)
PLATELETS: 251 x10ˆ3/uL (ref 150–400)
RBC: 3.31 x10ˆ6/uL — ABNORMAL LOW (ref 3.85–5.22)
RDW-CV: 12.9 % (ref 11.5–15.5)
WBC: 6.2 x10ˆ3/uL (ref 3.7–11.0)

## 2017-08-29 LAB — BASIC METABOLIC PANEL
ANION GAP: 1 mmol/L — ABNORMAL LOW (ref 10–20)
BUN/CREA RATIO: 11
BUN: 8 mg/dL (ref 8–25)
CALCIUM: 8.2 mg/dL — ABNORMAL LOW (ref 8.5–10.4)
CHLORIDE: 115 mmol/L — ABNORMAL HIGH (ref 98–107)
CO2 TOTAL: 25 mmol/L (ref 22–32)
CREATININE: 0.7 mg/dL (ref 0.49–1.10)
ESTIMATED GFR: 94 mL/min/1.73mˆ2
GLUCOSE: 90 mg/dL (ref 70–105)
POTASSIUM: 3.7 mmol/L (ref 3.5–5.1)
SODIUM: 141 mmol/L (ref 137–145)

## 2017-08-29 MED ORDER — POLYETHYLENE GLYCOL 3350 17 GRAM/DOSE ORAL POWDER
17.0000 g | Freq: Every day | ORAL | Status: DC
Start: 2017-08-29 — End: 2017-08-29
  Administered 2017-08-29: 17 g via ORAL
  Filled 2017-08-29 (×2): qty 3

## 2017-08-29 MED ORDER — SULFAMETHOXAZOLE 800 MG-TRIMETHOPRIM 160 MG TABLET
1.00 | ORAL_TABLET | Freq: Two times a day (BID) | ORAL | 0 refills | Status: AC
Start: 2017-08-29 — End: 2017-09-08

## 2017-08-29 MED ORDER — POLYETHYLENE GLYCOL 3350 17 GRAM ORAL POWDER PACKET
ORAL | Status: AC
Start: 2017-08-29 — End: 2017-08-29
  Filled 2017-08-29: qty 1

## 2017-08-29 MED ADMIN — lactated Ringers intravenous solution: TOPICAL | @ 09:00:00

## 2017-08-29 MED ADMIN — sodium chloride 0.9 % intravenous solution: @ 21:00:00

## 2017-08-29 MED ADMIN — sodium chloride 0.9 % (flush) injection syringe: INTRAVENOUS | @ 12:00:00

## 2017-08-29 MED ADMIN — gabapentin 400 mg capsule: @ 21:00:00

## 2017-08-29 NOTE — Nurses Notes (Signed)
Order received to discharge home.

## 2017-08-29 NOTE — Nurses Notes (Signed)
Patient currently has her IV Vanc running. Patient is trying to sleep. Will continue to monior

## 2017-08-29 NOTE — Care Plan (Signed)
Patient is A&Ox4. Skin is pink, warm, dry. Lungs are clear and equal bilaterally, with no signs of respiratory distress. Abdomen is soft and non tender with bowel sounds present in all 4 quads. Patient is continent of bowel and bladder. Patient is relaxed and sleeping at this time. Patient dressing is clean dry and intact. Pedal pulses are present and strong. Will continue to monitor patient. Jacqulyn Bath, LPN

## 2017-08-29 NOTE — Progress Notes (Signed)
[facility]  Medicine Progress Note    Holly Fritz  Date of service: 08/29/2017  Date of Admission:  08/26/2017    Hospital Day:  LOS: 3 days   Subjective:  Patient basically is doing okay she says she may still have some chest discomfort when she had the central line which she also wants a chest x-ray to make sure her lungs are clear will get that done there was some question of a spot on her CT scan and will take a look at that she is on IV vancomycin has been afebrile no cough no shortness of breath.  No signs of any respiratory distress  Vital Signs:  Temp  Avg: 37.1 C (98.7 F)  Min: 36.9 C (98.5 F)  Max: 37.1 C (98.8 F)    Pulse  Avg: 73  Min: 67  Max: 78 BP  Min: 117/80  Max: 135/49   Resp  Avg: 18  Min: 18  Max: 18 SpO2  Avg: 95.3 %  Min: 95 %  Max: 96 %   Pain Score (Numeric, Faces): 0      Input/Output    Intake/Output Summary (Last 24 hours) at 08/29/2017 0943  Last data filed at 08/29/2017 0636  Gross per 24 hour   Intake 750 ml   Output --   Net 750 ml    I/O last shift:  No intake/output data recorded.     Current Facility-Administered Medications:  capsaicin (ZOSTRIX) 0.025 % topical cream  Apply Topically 2x/day   cetirizine (ZYRTEC) tablet 10 mg Oral Daily   cholecalciferol (VITAMIN D3) tablet 1,000 Units Oral Daily   diphenhydrAMINE (BENADRYL) capsule 25 mg Oral Daily PRN   enoxaparin (LOVENOX) 40 mg/0.4 mL SubQ injection 40 mg Subcutaneous Daily   HYDROmorphone (DILAUDID) 1 mg/mL injection 0.5 mg Intravenous Q4H PRN   hydrOXYzine pamoate (VISTARIL) capsule 50 mg Oral 3x/day PRN   ibuprofen (MOTRIN) tablet 200 mg Oral 4x/day PRN   meclizine (ANTIVERT) tablet 50 mg Oral 2x/day PRN   methocarbamol (ROBAXIN) tablet 750 mg Oral Daily PRN   mirtazapine (REMERON) tablet 30 mg Oral NIGHTLY   nicotine (NICODERM CQ) transdermal patch (mg/24 hr) 21 mg Transdermal Daily   NS flush syringe 3 mL Intracatheter Q8HRS   And      NS flush syringe 3 mL Intracatheter Q1 MIN PRN   OLANZapine  (ZYPREXA) tablet 10 mg Oral NIGHTLY   OLANZapine (ZYPREXA) tablet 5 mg Oral QAM   ondansetron (ZOFRAN ODT) rapid dissolve tablet 4 mg Oral Q8H PRN   ondansetron (ZOFRAN) 2 mg/mL injection 4 mg Intravenous Q6H PRN   pantoprazole (PROTONIX) delayed release tablet 40 mg Oral 2x/day   polyethylene glycol (MIRALAX) oral powder 17 g Oral Daily   traZODone (DESYREL) tablet 150 mg Oral NIGHTLY   vancomycin (VANCOCIN) 1,250 mg in NS 250 mL IVPB 1,250 mg Intravenous Q12H   Vancomycin IV - Pharmacist to Dose per Protocol  Does not apply Daily PRN   vitamin A (AQUASOL A) capsule 10,000 Units Oral Daily       Physical Exam:  Constitutional:  appears in good health  Eyes:  Conjunctiva clear.  ENT:  Ear canals normal.  Neck:  supple, symmetrical, trachea midline  Respiratory:  Clear to auscultation and percussion bilaterally.  Exam is benign  Cardiovascular:  regular rate and rhythm, S1, S2 normal, no murmur, click, rub or gallop  Gastrointestinal:  Soft, non-tender no guarding no rigidity no rebound tenderness  Musculoskeletal:  Able to  move extremities no obvious weakness noted  Integumentary:  Skin warm and dry, No rashes and No lesions  Neurologic:  Grossly normal, CN II - XII grossly intact , Normal mental status, sensory, motor, cranial nerves, , coordination, , Alert and oriented x3, No tremor, No asterixis  Lymphatic/Immunologic/Hematologic:  Cervical, supraclavicular,  nodes normal.  Psychiatric:  Normal affect, behavior, memory, thought content, judgement, and speech.    Labs:  BMP  (Last 24 hours)    Date/Time Na K Cl CO2 BUN CREAT Calcium Glucose    08/29/17 0537 141    3.7    115 (H)    25    8    0.70    8.2 (L)    90           CBC  (Last 24 hours)    Date/Time WBC HGB HCT MCV PLATELETS    08/29/17 0537 6.2    9.7 (L)    28.4 (L)    85.8    251         WBC/Diff  (Last 24 hours)    Date/Time WBC Bands PMNs Lymphs Mono Eos Basos    08/29/17 0537 6.2    -- -- -- -- -- --            Radiology:          Assessment/ Plan:    Active Hospital Problems    Diagnosis   . Primary Problem: Bacteremia due to coagulase-negative Staphylococcus   . Chiari I malformation (CMS HCC)   . Ehlers-Danlos syndrome   . Gastroparesis     Will get chest x-ray today.  Trying to wait on the culture reports if it is negative by today evening then she would I finished almost 72 hr of IV vancomycin and she would be in a position to go home if he do not get the cultures today will have to wait till tomorrow and make a decision.  Patient otherwise is doing okay she does not appear to be in any acute distress.  Moderate level progress note discharge planning decision to be done later today.      Dyanne Iha, MD

## 2017-08-29 NOTE — Nurses Notes (Signed)
Review of discharge instructions completed. HL removed. Left med-surg via w/c accompanied by CNA in stable condition

## 2017-08-29 NOTE — Care Plan (Signed)
IDG rounds complete. Patient resting in bed at this time. She denies any unmet needs. States she has plenty of help at home. Waiting on culture results to determine disposition. Patient room is clean and free from excess clutter. Will cont to monitor for developing discharge needs.

## 2017-08-29 NOTE — Discharge Summary (Signed)
[facility]  DISCHARGE SUMMARY      PATIENT NAME:  Holly Fritz, Holly Fritz  MRN:  [id number]  DOB:  1980-03-31    ENCOUNTER START DATE:  08/26/2017  INPATIENT ADMISSION DATE: 08/26/2017    DISCHARGE DATE:  08/29/2017    ATTENDING PHYSICIAN: Dyanne Iha, MD  SERVICE: [facility] MED/SURG  PRIMARY CARE PHYSICIAN: Eddie Dibbles, MD     Reason for Admission     Diagnosis    Bacteremia [10142]          DISCHARGE DIAGNOSIS:     Principal Problem:  Bacteremia due to coagulase-negative Staphylococcus    Active Hospital Problems    Diagnosis Date Noted   . Principle Problem: Bacteremia due to coagulase-negative Staphylococcus 08/26/2017   . 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 Gastroparesis 08/27/2017      Resolved Hospital Problems   No resolved problems to display.     There are no active non-hospital problems to display for this patient.     Allergies   Allergen Reactions   . Adhesive Rash   . Doxycycline    . Penicillins             DISCHARGE MEDICATIONS:     Current Discharge Medication List      START taking these medications.      Details   trimethoprim-sulfamethoxazole 800-160 mg Tablet  Commonly known as:  BACTRIM DS   1 Tab, Oral, 2 TIMES DAILY, Take medicine with food.  Qty:  20 Tab  Refills:  0        CONTINUE these medications - NO CHANGES were made during your visit.      Details   baclofen 10 mg Tablet  Commonly known as:  LIORESAL   10 mg, Oral, EVERY 72 HOURS PRN  Refills:  0     capsaicin 0.025 % Cream  Commonly known as:  ZOSTRIX   Apply Topically, 2 TIMES DAILY  Refills:  0     cetirizine 10 mg Tablet  Commonly known as:  ZYRTEC   10 mg, Oral, DAILY  Refills:  0     cholecalciferol (vitamin D3) 1,000 unit Tablet   1,000 Units, Oral, DAILY  Refills:  0     diphenhydrAMINE 25 mg Capsule  Commonly known as:  BENADRYL   25 mg, Oral, DAILY PRN  Refills:  0     Excedrin 250-250-65 mg Tablet  Generic drug:  aspirin-acetaminophen-caffeine   2 Tabs, Oral, EVERY 6  HOURS PRN  Refills:  0     hydrOXYzine pamoate 50 mg Capsule  Commonly known as:  VISTARIL   50 mg, Oral, 3 TIMES DAILY PRN  Refills:  0     Ibuprofen 200 mg Tablet  Commonly known as:  MOTRIN   200 mg, Oral, 4 TIMES DAILY PRN  Refills:  0     meclizine 25 mg Tablet  Commonly known as:  ANTIVERT   50 mg, Oral, 2 TIMES DAILY PRN  Qty:  15 Tab  Refills:  0     methocarbamol 750 mg Tablet  Commonly known as:  ROBAXIN   750 mg, Oral, DAILY PRN  Refills:  0     mirtazapine 30 mg Tablet  Commonly known as:  REMERON   30 mg, Oral, NIGHTLY  Refills:  0     naproxen 250 mg Tablet  Commonly known as:  NAPROSYN   220 mg, Oral, DAILY PRN  Refills:  0     * OLANZapine 5 mg Tablet  Commonly known as:  ZYPREXA   5 mg, Oral, EVERY MORNING  Refills:  0     * OLANZapine 10 mg Tablet  Commonly known as:  ZYPREXA   10 mg, Oral, NIGHTLY  Refills:  0     ondansetron 4 mg Tablet, Rapid Dissolve  Commonly known as:  ZOFRAN ODT   4 mg, Oral, EVERY 8 HOURS PRN  Refills:  0     pantoprazole 40 mg Tablet, Delayed Release (E.C.)  Commonly known as:  PROTONIX   40 mg, Oral, 2 TIMES DAILY  Refills:  0     raNITIdine 150 mg Tablet  Commonly known as:  ZANTAC   150 mg, Oral, DAILY PRN  Refills:  0     SALINE NOSE NASL   Nasal  Refills:  0     traZODone 150 mg Tablet  Commonly known as:  DESYREL   150 mg, Oral, NIGHTLY  Refills:  0     vitamin A 10,000 unit Capsule  Commonly known as:  AQUASOL A   10,000 Units, Oral, DAILY  Refills:  0         * This list has 2 medication(s) that are the same as other medications prescribed for you. Read the directions carefully, and ask your doctor or other care provider to review them with you.              Discharge med list refreshed?  YES        DISCHARGE INSTRUCTIONS:       DISCHARGE INSTRUCTION - DIET     Diet: RESUME HOME DIET      DISCHARGE INSTRUCTION - ACTIVITY     Activity: AS TOLERATED      DISCHARGE INSTRUCTION - MISC    FU WITH YOUR FAMILY DOCTOR WITHIN A WEEK OR TWO.    YOUR SCRIPT FOR BACTRIM HAS  BEEN SENT TO [facility].          REASON FOR HOSPITALIZATION AND HOSPITAL COURSE:  This is a 37 y.o., female WITH A Central line on rt side for 2 years. Pt has chronic med problems. Cultures grew staph coag negative. Pt was started on iv vancomycin. Got surgical consult and removed the central linne. The tip of central line grew staph coag.  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      discuseed with ID specialist. at [facility] . UPTO DATE guidelines fro staph coag negative bacteremia from central line reveiwed.  Repeat blood cultures were negative . Iv vancomycin for 72 hours.   Recommendation was no more antibiotic. Dr.casey and pt and myself had a discussion. Pt would be more comfortable with antibiotic for another 10 days to finish a 14 days course.   Allergies reviewed. Pt has taken bactrim before.   Pt to finish 10 day course of bactrim.   Repeat chest xray today negative for pneumonia.     Pt wbc count is normal and has been afebrile thru out her hospital stay. So we feel that the bacteremia has resolved. ofcourse pt needs a fu with her pcp for close monitoring. Pt was advised.  Patient Vitals for the past 24 hrs:   BP Temp Pulse Resp SpO2   08/29/17 1611 121/80 37 C (98.6 F) 64 18 99 %   08/29/17 1100 125/74 37.2 C (98.9 F) 59  18 96 %   08/29/17 0900 121/77 36.9 C (98.5 F) 78 18 95 %   08/29/17 0017 (!) 135/49 37.1 C (98.8 F) 74 18 95 %     General appearance: alert, cooperative, no distress, appears stated age  Head: Normocephalic, without obvious abnormality, atraumatic  Eyes: conjunctivae/corneas clear. PERRL  Ears: normal TM's and external ear canals AU  Nose: Nares normal. Mucosa normal.  Throat: Lips, mucosa, and tongue normal. Teeth and gums normal  Neck: supple, symmetrical, trachea midline, no adenopathy  Lungs: clear to auscultation bilaterally, no crackles, no wheezes   Heart: regular rate and rhythm, no  chest pain, no shortness of breath, no significant murmurs, no carotid bruits  Abdomen: no abdominal pain,tenderness, guarding, rigidity and rebound tenderness    Extremities: extremities normal, atraumatic, no cyanosis or edema  Lymph nodes: Cervical, normal  Neuro: no obvious focal neuro deficit.  Skin no rash.    Labs  Results for orders placed or performed during the hospital encounter of 08/26/17   STERILE SITE CULTURE AND GRAM STAIN, AEROBIC   Result Value Ref Range    FLC Abnormal Stain (A)     GRAM STAIN 1+ Rare PMNs     GRAM STAIN 2+ Few Gram Positive Cocci/Clusters    ADULT ROUTINE BLOOD CULTURE, SET OF 2 BOTTLES (BACTERIA AND YEAST)   Result Value Ref Range    BLOOD CULTURE, ROUTINE No Growth 18-24 hrs.    ADULT ROUTINE BLOOD CULTURE, SET OF 2 BOTTLES (BACTERIA AND YEAST)   Result Value Ref Range    BLOOD CULTURE, ROUTINE No Growth 18-24 hrs.    COMPREHENSIVE METABOLIC PANEL, NON-FASTING   Result Value Ref Range    SODIUM 138 137 - 145 mmol/L    POTASSIUM 3.7 3.5 - 5.1 mmol/L    CHLORIDE 103 98 - 107 mmol/L    CO2 TOTAL 29 22 - 32 mmol/L    ANION GAP 6 (L) 10 - 20 mmol/L    BUN 7 (L) 8 - 25 mg/dL    CREATININE 0.70 0.49 - 1.10 mg/dL    BUN/CREA RATIO 10     ESTIMATED GFR 94 mL/min/1.50m2    ALBUMIN 4.3 3.5 - 5.0 g/dL    CALCIUM 9.7 8.5 - 10.4 mg/dL    GLUCOSE 95 70 - 105 mg/dL    ALKALINE PHOSPHATASE 120 38 - 126 U/L    ALT (SGPT) 47 (H) <=35 U/L    AST (SGOT) 48 (H) 14 - 36 U/L    BILIRUBIN TOTAL 0.2 (L) 0.3 - 1.3 mg/dL    PROTEIN TOTAL 7.5 6.4 - 8.3 g/dL   LACTIC ACID LEVEL   Result Value Ref Range    LACTIC ACID 0.9 0.7 - 2.1 mmol/L   CBC WITH DIFF   Result Value Ref Range    WBC 8.3 3.7 - 11.0 x10^3/uL    RBC 4.13 3.85 - 5.22 x10^6/uL    HGB 11.8 11.5 - 16.0 g/dL    HCT 35.7 34.8 - 46.0 %    MCV 86.4 78.0 - 100.0 fL    MCH 28.6 26.0 - 32.0 pg    MCHC 33.1 31.0 - 35.5 g/dL    RDW-CV 12.8 11.5 - 15.5 %    PLATELETS 296 150 - 400 x10^3/uL    MPV 9.1 8.7 - 12.5 fL   BLUE TOP TUBE   Result Value Ref Range     RAINBOW/EXTRA TUBE AUTO RESULT Yes    GOLD TOP TUBE   Result Value  Ref Range    RAINBOW/EXTRA TUBE AUTO RESULT Yes    MANUAL DIFF AND MORPHOLOGY-SYSMEX   Result Value Ref Range    NEUTROPHIL % 69 %    LYMPHOCYTE %  26 %    MONOCYTE % 3 %    EOSINOPHIL % 0 %    BASOPHIL % 0 %    REACTIVE LYMPHOCYTE % 2 %    NEUTROPHIL # 5.73 1.50 - 7.70 x10^3/uL    LYMPHOCYTE # 2.32 1.00 - 4.80 x10^3/uL    MONOCYTE # 0.25 0.20 - 1.10 x10^3/uL    EOSINOPHIL # <0.04 <=0.50 x10^3/uL    BASOPHIL # <0.04 <=0.20 x10^3/uL    RBC MORPHOLOGY Normal RBC and PLT Morphology    SEDIMENTATION RATE   Result Value Ref Range    ERYTHROCYTE SEDIMENTATION RATE (ESR) 7 0 - 20 mm/hr   GOLD TOP TUBE   Result Value Ref Range    RAINBOW/EXTRA TUBE AUTO RESULT Yes    COMPREHENSIVE METABOLIC PNL, FASTING   Result Value Ref Range    SODIUM 143 137 - 145 mmol/L    POTASSIUM 3.9 3.5 - 5.1 mmol/L    CHLORIDE 114 (H) 98 - 107 mmol/L    CO2 TOTAL 24 22 - 32 mmol/L    ANION GAP 5 (L) 10 - 20 mmol/L    BUN 9 8 - 25 mg/dL    CREATININE 0.70 0.49 - 1.10 mg/dL    BUN/CREA RATIO 13     ESTIMATED GFR 94 mL/min/1.39m2    ALBUMIN 3.3 (L) 3.5 - 5.0 g/dL    CALCIUM 8.2 (L) 8.5 - 10.4 mg/dL    GLUCOSE 90 70 - 105 mg/dL    ALKALINE PHOSPHATASE 112 38 - 126 U/L    ALT (SGPT) 37 (H) <=35 U/L    AST (SGOT) 35 14 - 36 U/L    BILIRUBIN TOTAL 0.2 (L) 0.3 - 1.3 mg/dL    PROTEIN TOTAL 5.9 (L) 6.4 - 8.3 g/dL   LACTIC ACID LEVEL   Result Value Ref Range    LACTIC ACID 0.8 0.7 - 2.1 mmol/L   MAGNESIUM   Result Value Ref Range    MAGNESIUM 1.9 1.6 - 2.5 mg/dL   C-REACTIVE PROTEIN(CRP),INFLAMMATION   Result Value Ref Range    CRP INFLAMMATION 6.0 <=8.0 mg/L   CBC WITH DIFF   Result Value Ref Range    WBC 7.2 3.7 - 11.0 x10^3/uL    RBC 3.65 (L) 3.85 - 5.22 x10^6/uL    HGB 10.5 (L) 11.5 - 16.0 g/dL    HCT 31.3 (L) 34.8 - 46.0 %    MCV 85.8 78.0 - 100.0 fL    MCH 28.8 26.0 - 32.0 pg    MCHC 33.5 31.0 - 35.5 g/dL    RDW-CV 12.8 11.5 - 15.5 %    PLATELETS 223 150 - 400 x10^3/uL    MPV 9.6  8.7 - 12.5 fL   VANCOMYCIN, TROUGH   Result Value Ref Range    VANCOMYCIN TROUGH 10.9 (H) 5.0 - 10.0 ug/mL    DOSE DATE 08/28/2017     DOSE TIME 11:00 AM    BASIC METABOLIC PANEL   Result Value Ref Range    SODIUM 141 137 - 145 mmol/L    POTASSIUM 3.7 3.5 - 5.1 mmol/L    CHLORIDE 115 (H) 98 - 107 mmol/L    CO2 TOTAL 25 22 - 32 mmol/L    ANION GAP 1 (L) 10 - 20 mmol/L  CALCIUM 8.2 (L) 8.5 - 10.4 mg/dL    GLUCOSE 90 70 - 105 mg/dL    BUN 8 8 - 25 mg/dL    CREATININE 0.70 0.49 - 1.10 mg/dL    BUN/CREA RATIO 11     ESTIMATED GFR 94 mL/min/1.63m2   CBC WITH DIFF   Result Value Ref Range    WBC 6.2 3.7 - 11.0 x10^3/uL    RBC 3.31 (L) 3.85 - 5.22 x10^6/uL    HGB 9.7 (L) 11.5 - 16.0 g/dL    HCT 28.4 (L) 34.8 - 46.0 %    MCV 85.8 78.0 - 100.0 fL    MCH 29.3 26.0 - 32.0 pg    MCHC 34.2 31.0 - 35.5 g/dL    RDW-CV 12.9 11.5 - 15.5 %    PLATELETS 251 150 - 400 x10^3/uL    MPV 9.5 8.7 - 12.5 fL   TRANSTHORACIC ECHOCARDIOGRAM - ADULT   Result Value Ref Range    LVOT diameter 1.50 cm    Left Ventricular Outflow Tract Peak Velocity 102.00 cm/s    AV LVOT peak gradient 4.00 mmHg    TDI 15.10 cm/s    TDI 15.10 cm/s    E/E' ratio 7.10     LVOT stroke volume 46.00 cm3    LA volume 19.90 cm3    LA size 3.00 cm    LA size 3.00 cm    LA Volume Index 16.9 ml/m2    Left Atrium to Aortic Root Ratio 1.50     Right Atrium Pressure 3.00 mmHg    AV mean gradient 6.00 mmHg    Ao VTI 37.30 cm    Ao peak vel 148.00 cm/s    Aortic Valve Area by Continuity of VTI 1.23 cm2    Aortic Valve Area by Continuity of Peak Velocity 1.22 cm2    Ao root annulus 2.00 cm    Ao root annulus 2.00 cm    Ascending aorta 2.70 cm    Inferior Vena Cava Diameter 1.95 cm    Inferior Vena Cava Diameter 1.95 cm    Mr max vel 310.00 cm/s    Mitral Valve Max Velocity 131.00 cm/s    E wave decelartion time 268.00 ms    MV stenosis pressure 1/2 time 5.00 ms    MV Peak A Vel 62.40 cm/s    MV Peak E Vel 107.00 cm/s    MV mean gradient 2.00 mmHg    MV Comp VTI 39.20 cm    E/A  ratio 1.70     Pulmonic Regurgitant Max Peak Gradiant 1.00 mmHg    Pulmonic Valve Acceleration Time 165.00 ms    PV mean gradient 1.00 mmHg    Pulmonic Valve Systolic Velocity Time Integral 22.30 cm    Pulmonic Valve Max Velocity 67.40 cm/s    PV peak gradient 2.00 mmHg    RVDD 4.07 cm    RVDD 4.07 cm       Imaging    Results for orders placed or performed during the hospital encounter of 08/26/17 (from the past 72 hour(s))   MOBILE CHEST X-RAY     Status: None    Narrative    ** [facility] Please see Epic for the scanned report in the patient's chart.**   CT LUMBAR SPINE WO IV CONTRAST     Status: None    Narrative    ** [facility] result [facility] in [facility] results  system. Please see Epic for the scanned report in the patient's chart.**   CT THORACIC SPINE WO IV CONTRAST     Status: None    Narrative    ** This result was created in [facility] external results system. Please see Epic for the scanned report in the patient's chart.**   CT CERVICAL SPINE WO IV CONTRAST     Status: None    Narrative    ** This result was created in [facility] external results system. Please see Epic for the scanned report in the patient's chart.**   CT CHEST ABDOMEN PELVIS WO IV CONTRAST     Status: None    Narrative    ** This result was created in [facility] external results system. Please see Epic for the scanned report in the patient's chart.**   [place] FEMALE PELVIS     Status: None    Narrative    ** This result was created in [facility] external results system. Please see Epic for the scanned report in the patient's chart.**   XR CHEST AP AND LATERAL     Status: None    Narrative    ** This result was created in [facility] external results system. Please see Epic for the scanned report in the patient's chart.**       Labs/radiology reviewed. See  chart.    PATIENT/FAMILY  WAS ADVISED: THIS WAS AN ACUTE STAY IN THE HOSPITAL.  REVIEW ALL YOUR  RADIOLOGY TESTS, LABS, HOSPITAL WORKUP, AND PENDING TESTS WITH YOUR FAMILY DOCTOR.          CONDITION ON DISCHARGE:  A. Ambulation: Full ambulation  B. Self-care Ability: Complete  C. Cognitive Status Oriented x 3  D. DNR status at discharge: Full Code    Advance Directive Information      Most Recent Value   Does the Patient have an Advance Directive?  Yes, Patient Does Have Advance Directive for Healthcare Treatment          DISCHARGE DISPOSITION:  Home discharge  High level discharge summary time spent 45 mins.                    Dyanne Iha, MD        Copies sent to Care Team       Relationship Specialty Notifications Start End    Eddie Dibbles, MD PCP - General EXTERNAL  07/18/17     Phone: [phone] Fax: [address] [place] [address]          Referring providers can utilize [email or web address] to access their referred [facility] patient's information.

## 2017-08-30 LAB — STERILE SITE CULTURE AND GRAM STAIN, AEROBIC: FLC: ABNORMAL — AB

## 2017-09-01 LAB — ANAEROBIC CULTURE

## 2017-09-02 LAB — ADULT ROUTINE BLOOD CULTURE, SET OF 2 BOTTLES (BACTERIA AND YEAST): BLOOD CULTURE, ROUTINE: NO GROWTH

## 2017-09-03 ENCOUNTER — Ambulatory Visit (HOSPITAL_COMMUNITY): Payer: Self-pay | Admitting: INTERNAL MEDICINE

## 2017-09-03 LAB — ADULT ROUTINE BLOOD CULTURE, SET OF 2 BOTTLES (BACTERIA AND YEAST): BLOOD CULTURE, ROUTINE: ABNORMAL — CR

## 2017-09-04 ENCOUNTER — Other Ambulatory Visit (INDEPENDENT_AMBULATORY_CARE_PROVIDER_SITE_OTHER): Payer: Self-pay | Admitting: INTERNAL MEDICINE

## 2017-09-04 DIAGNOSIS — Q796 Ehlers-Danlos syndrome, unspecified: Secondary | ICD-10-CM

## 2017-09-04 DIAGNOSIS — G935 Compression of brain: Secondary | ICD-10-CM

## 2017-09-04 DIAGNOSIS — R7881 Bacteremia: Secondary | ICD-10-CM

## 2017-09-04 DIAGNOSIS — B957 Other staphylococcus as the cause of diseases classified elsewhere: Secondary | ICD-10-CM

## 2017-09-18 ENCOUNTER — Ambulatory Visit (INDEPENDENT_AMBULATORY_CARE_PROVIDER_SITE_OTHER): Payer: Self-pay | Admitting: Pediatrics

## 2017-09-22 ENCOUNTER — Ambulatory Visit (INDEPENDENT_AMBULATORY_CARE_PROVIDER_SITE_OTHER): Payer: Self-pay | Admitting: Infectious Disease

## 2018-01-05 ENCOUNTER — Other Ambulatory Visit (HOSPITAL_COMMUNITY): Payer: Self-pay

## 2018-01-05 DIAGNOSIS — N83202 Unspecified ovarian cyst, left side: Principal | ICD-10-CM

## 2018-01-05 DIAGNOSIS — N83201 Unspecified ovarian cyst, right side: Secondary | ICD-10-CM

## 2018-01-14 ENCOUNTER — Ambulatory Visit
Admission: RE | Admit: 2018-01-14 | Discharge: 2018-01-14 | Disposition: A | Discharge status: Home / Self Care | Payer: Commercial Managed Care - PPO | Source: Ambulatory Visit | Attending: OBSTETRICS/GYNECOLOGY | Admitting: OBSTETRICS/GYNECOLOGY

## 2018-01-14 DIAGNOSIS — N888 Other specified noninflammatory disorders of cervix uteri: Secondary | ICD-10-CM | POA: Insufficient documentation

## 2018-01-14 DIAGNOSIS — N83201 Unspecified ovarian cyst, right side: Secondary | ICD-10-CM | POA: Insufficient documentation

## 2018-01-14 DIAGNOSIS — D259 Leiomyoma of uterus, unspecified: Secondary | ICD-10-CM | POA: Insufficient documentation

## 2018-01-14 DIAGNOSIS — N83202 Unspecified ovarian cyst, left side: Secondary | ICD-10-CM | POA: Insufficient documentation

## 2018-03-02 ENCOUNTER — Ambulatory Visit: Payer: 59 | Attending: Psychiatric/Mental Health

## 2018-03-02 DIAGNOSIS — Z5181 Encounter for therapeutic drug level monitoring: Secondary | ICD-10-CM | POA: Insufficient documentation

## 2018-03-02 DIAGNOSIS — Z79899 Other long term (current) drug therapy: Secondary | ICD-10-CM | POA: Insufficient documentation

## 2018-03-02 LAB — CBC WITH DIFF
BASOPHIL #: <0.04 10*3/uL (ref <=0.20)
BASOPHIL %: 0 %
EOSINOPHIL #: 0.11 x10ˆ3/uL (ref <=0.50)
EOSINOPHIL %: 1 %
HCT: 39.7 % (ref 34.8–46.0)
HGB: 13.1 g/dL (ref 11.5–16.0)
IMMATURE GRANULOCYTE #: <0.04 x10ˆ3/uL (ref <0.10)
IMMATURE GRANULOCYTE %: 0 % (ref 0–1)
LYMPHOCYTE #: 2.11 x10ˆ3/uL (ref 1.00–4.80)
LYMPHOCYTE %: 27 %
MCH: 29 pg (ref 26.0–32.0)
MCHC: 33 g/dL (ref 31.0–35.5)
MCV: 88 fL (ref 78.0–100.0)
MONOCYTE #: 0.54 10*3/uL (ref 0.20–1.10)
MONOCYTE %: 7 %
MPV: 9.4 fL (ref 8.7–12.5)
NEUTROPHIL #: 4.96 x10ˆ3/uL (ref 1.50–7.70)
NEUTROPHIL %: 65 %
PLATELETS: 381 x10ˆ3/uL (ref 150–400)
RBC: 4.51 x10ˆ6/uL (ref 3.85–5.22)
RDW-CV: 13.4 % (ref 11.5–15.5)
WBC: 7.8 x10ˆ3/uL (ref 3.7–11.0)

## 2018-03-02 LAB — THYROID STIMULATING HORMONE (SENSITIVE TSH): TSH: 0.848 u[IU]/mL (ref 0.350–5.000)

## 2018-03-02 LAB — COMPREHENSIVE METABOLIC PNL, FASTING
ALBUMIN: 4.4 g/dL (ref 3.5–5.0)
ALKALINE PHOSPHATASE: 67 U/L (ref 38–126)
ALT (SGPT): 18 U/L (ref <=35)
ANION GAP: 6 mmol/L — ABNORMAL LOW (ref 10–20)
AST (SGOT): 21 U/L (ref 14–36)
BILIRUBIN TOTAL: 0.2 mg/dL — ABNORMAL LOW (ref 0.3–1.3)
BUN/CREA RATIO: 19
BUN: 13 mg/dL (ref 8–25)
CALCIUM: 9 mg/dL (ref 8.5–10.4)
CHLORIDE: 106 mmol/L (ref 98–107)
CO2 TOTAL: 27 mmol/L (ref 22–32)
CREATININE: 0.7 mg/dL (ref 0.49–1.10)
ESTIMATED GFR: 111 mL/min/1.73mˆ2
GLUCOSE: 93 mg/dL (ref 70–105)
POTASSIUM: 4 mmol/L (ref 3.5–5.1)
PROTEIN TOTAL: 7.4 g/dL (ref 6.4–8.3)
SODIUM: 139 mmol/L (ref 137–145)

## 2018-03-02 LAB — LIPID PANEL
CHOL/HDL RATIO: 4.4
CHOLESTEROL: 231 mg/dL — ABNORMAL HIGH (ref <=200)
CHOLESTEROL: 231 mg/dL — ABNORMAL HIGH (ref <=200)
HDL CHOL: 52 mg/dL
LDL CALC: 155 mg/dL — ABNORMAL HIGH (ref 0–129)
TRIGLYCERIDES: 119 mg/dL (ref <=150)
VLDL CALC: 24 mg/dL

## 2018-03-02 LAB — VITAMIN D 25 TOTAL: VITAMIN D 25, TOTAL: 23.3 ng/mL

## 2018-03-02 LAB — THYROXINE, FREE (FREE T4)
THYROXINE (T4), FREE: 0.86 ng/dL (ref 0.78–2.19)
THYROXINE (T4), FREE: 0.86 ng/dL (ref 0.78–2.19)

## 2018-03-02 LAB — HGA1C (HEMOGLOBIN A1C WITH EST AVG GLUCOSE)
ESTIMATED AVERAGE GLUCOSE: 103 mg/dL
HEMOGLOBIN A1C: 5.2 % (ref 4.0–6.0)

## 2018-05-03 ENCOUNTER — Emergency Department
Admission: EM | Admit: 2018-05-03 | Discharge: 2018-05-03 | Disposition: A | Discharge status: Home / Self Care | Payer: 59 | Attending: INTERNAL MEDICINE | Admitting: INTERNAL MEDICINE

## 2018-05-03 ENCOUNTER — Encounter (HOSPITAL_COMMUNITY): Payer: Self-pay

## 2018-05-03 ENCOUNTER — Emergency Department (HOSPITAL_COMMUNITY): Payer: 59

## 2018-05-03 ENCOUNTER — Other Ambulatory Visit: Payer: Self-pay

## 2018-05-03 DIAGNOSIS — K8020 Calculus of gallbladder without cholecystitis without obstruction: Secondary | ICD-10-CM | POA: Insufficient documentation

## 2018-05-03 DIAGNOSIS — R1011 Right upper quadrant pain: Secondary | ICD-10-CM

## 2018-05-03 DIAGNOSIS — Q796 Ehlers-Danlos syndrome, unspecified: Secondary | ICD-10-CM | POA: Insufficient documentation

## 2018-05-03 DIAGNOSIS — K3184 Gastroparesis: Secondary | ICD-10-CM | POA: Insufficient documentation

## 2018-05-03 DIAGNOSIS — R110 Nausea: Secondary | ICD-10-CM

## 2018-05-03 DIAGNOSIS — Z79899 Other long term (current) drug therapy: Secondary | ICD-10-CM | POA: Insufficient documentation

## 2018-05-03 DIAGNOSIS — Z7982 Long term (current) use of aspirin: Secondary | ICD-10-CM | POA: Insufficient documentation

## 2018-05-03 DIAGNOSIS — Z791 Long term (current) use of non-steroidal anti-inflammatories (NSAID): Secondary | ICD-10-CM | POA: Insufficient documentation

## 2018-05-03 DIAGNOSIS — F17210 Nicotine dependence, cigarettes, uncomplicated: Secondary | ICD-10-CM | POA: Insufficient documentation

## 2018-05-03 DIAGNOSIS — G935 Compression of brain: Secondary | ICD-10-CM | POA: Insufficient documentation

## 2018-05-03 LAB — MAGNESIUM: MAGNESIUM: 2.1 mg/dL (ref 1.6–2.5)

## 2018-05-03 LAB — CBC WITH DIFF
BASOPHIL #: <0.04 10*3/uL (ref <=0.20)
BASOPHIL %: 0 %
EOSINOPHIL #: 0.06 x10ˆ3/uL (ref <=0.50)
EOSINOPHIL %: 0 %
HCT: 40.8 % (ref 34.8–46.0)
HGB: 14.1 g/dL (ref 11.5–16.0)
IMMATURE GRANULOCYTE #: 0.05 10*3/uL (ref <0.10)
IMMATURE GRANULOCYTE %: 0 % (ref 0–1)
LYMPHOCYTE #: 1.68 x10ˆ3/uL (ref 1.00–4.80)
LYMPHOCYTE %: 9 %
MCH: 28.9 pg (ref 26.0–32.0)
MCHC: 34.6 g/dL (ref 31.0–35.5)
MCV: 83.6 fL (ref 78.0–100.0)
MONOCYTE #: 1.58 x10ˆ3/uL — ABNORMAL HIGH (ref 0.20–1.10)
MONOCYTE %: 9 %
MPV: 9.2 fL (ref 8.7–12.5)
NEUTROPHIL #: 14.88 x10ˆ3/uL — ABNORMAL HIGH (ref 1.50–7.70)
NEUTROPHIL %: 82 %
PLATELETS: 406 10*3/uL — ABNORMAL HIGH (ref 150–400)
RBC: 4.88 10*6/uL (ref 3.85–5.22)
RDW-CV: 13.5 % (ref 11.5–15.5)
WBC: 18.3 10*3/uL — ABNORMAL HIGH (ref 3.7–11.0)

## 2018-05-03 LAB — URINALYSIS, MACROSCOPIC
BILIRUBIN: NEGATIVE mg/dL
BLOOD: NEGATIVE mg/dL
GLUCOSE: NEGATIVE mg/dL
KETONES: NEGATIVE mg/dL
KETONES: NEGATIVE mg/dL
LEUKOCYTES: NEGATIVE WBCs/uL
NITRITE: NEGATIVE
PH: 6.5 (ref 5.0–8.0)
PROTEIN: NEGATIVE mg/dL
SPECIFIC GRAVITY: 1.02 (ref 1.005–1.030)
UROBILINOGEN: 0.2 mg/dL (ref 0.2–1.0)

## 2018-05-03 LAB — COMPREHENSIVE METABOLIC PANEL, NON-FASTING
ALBUMIN: 4.5 g/dL (ref 3.5–5.0)
ALKALINE PHOSPHATASE: 83 U/L (ref 38–126)
ALT (SGPT): 24 U/L (ref <=35)
ANION GAP: 8 mmol/L — ABNORMAL LOW (ref 10–20)
AST (SGOT): 23 U/L (ref 14–36)
BILIRUBIN TOTAL: 0.6 mg/dL (ref 0.3–1.3)
BUN/CREA RATIO: 22
BUN: 13 mg/dL (ref 8–25)
BUN: 13 mg/dL (ref 8–25)
CALCIUM: 10 mg/dL (ref 8.5–10.4)
CHLORIDE: 99 mmol/L (ref 98–107)
CO2 TOTAL: 25 mmol/L (ref 22–32)
CREATININE: 0.6 mg/dL (ref 0.49–1.10)
ESTIMATED GFR: 116 mL/min/1.73mˆ2
GLUCOSE: 119 mg/dL — ABNORMAL HIGH (ref 70–105)
POTASSIUM: 3.9 mmol/L (ref 3.5–5.1)
PROTEIN TOTAL: 7.9 g/dL (ref 6.4–8.3)
PROTEIN TOTAL: 7.9 g/dL (ref 6.4–8.3)
SODIUM: 132 mmol/L — ABNORMAL LOW (ref 137–145)

## 2018-05-03 LAB — CREATINE KINASE (CK), TOTAL, SERUM OR PLASMA: CREATINE KINASE: 21 U/L — ABNORMAL LOW (ref 30–135)

## 2018-05-03 LAB — TROPONIN-I: TROPONIN I: <=0.01 ng/mL (ref <=0.03)

## 2018-05-03 LAB — PT/INR
INR: 1.06 (ref <=4.50)
PROTHROMBIN TIME: 12.3 s (ref 9.4–12.5)

## 2018-05-03 LAB — LIPASE: LIPASE: 14 U/L — ABNORMAL LOW (ref 23–300)

## 2018-05-03 LAB — URINALYSIS, MICROSCOPIC

## 2018-05-03 LAB — PTT (PARTIAL THROMBOPLASTIN TIME): APTT: 39.8 s — ABNORMAL HIGH (ref 25.1–36.5)

## 2018-05-03 LAB — HCG, SERUM QUALITATIVE, PREGNANCY: PREGNANCY, SERUM QUALITATIVE: NEGATIVE

## 2018-05-03 LAB — ETHANOL, SERUM/PLASMA: ETHANOL: <10 mg/dL (ref <=10)

## 2018-05-03 LAB — LACTIC ACID LEVEL W/ REFLEX FOR LEVEL >2.0: LACTIC ACID: 1.3 mmol/L (ref 0.7–2.1)

## 2018-05-03 LAB — THYROID STIMULATING HORMONE (SENSITIVE TSH): TSH: 0.482 u[IU]/mL (ref 0.350–5.000)

## 2018-05-03 MED ORDER — IBUPROFEN 600 MG TABLET
600.0000 mg | ORAL_TABLET | Freq: Four times a day (QID) | ORAL | 0 refills | Status: DC | PRN
Start: 2018-05-03 — End: 2023-05-01

## 2018-05-03 MED ORDER — SODIUM CHLORIDE 0.9 % (FLUSH) INJECTION SYRINGE
3.0000 mL | INJECTION | INTRAMUSCULAR | Status: DC | PRN
Start: 2018-05-03 — End: 2018-05-03

## 2018-05-03 MED ORDER — SODIUM CHLORIDE 0.9 % (FLUSH) INJECTION SYRINGE
3.0000 mL | INJECTION | Freq: Three times a day (TID) | INTRAMUSCULAR | Status: DC
Start: 2018-05-03 — End: 2018-05-03

## 2018-05-03 MED ORDER — FENTANYL (PF) 50 MCG/ML INJECTION SOLUTION
25.0000 ug | INTRAMUSCULAR | Status: AC
Start: 2018-05-03 — End: 2018-05-03
  Administered 2018-05-03: 25 ug via INTRAVENOUS
  Filled 2018-05-03: qty 2

## 2018-05-03 MED ORDER — FENTANYL (PF) 50 MCG/ML INJECTION SOLUTION
50.00 ug | INTRAMUSCULAR | Status: AC
Start: 2018-05-03 — End: 2018-05-03
  Administered 2018-05-03: 50 ug via INTRAVENOUS
  Filled 2018-05-03: qty 2

## 2018-05-03 MED ORDER — ONDANSETRON HCL (PF) 4 MG/2 ML INJECTION SOLUTION
4.0000 mg | INTRAMUSCULAR | Status: AC
Start: 2018-05-03 — End: 2018-05-03
  Administered 2018-05-03: 4 mg via INTRAVENOUS
  Filled 2018-05-03: qty 2

## 2018-05-03 MED ORDER — ONDANSETRON HCL (PF) 4 MG/2 ML INJECTION SOLUTION
4.00 mg | INTRAMUSCULAR | Status: AC
Start: 2018-05-03 — End: 2018-05-03
  Administered 2018-05-03: 4 mg via INTRAVENOUS
  Filled 2018-05-03: qty 2

## 2018-05-03 MED ORDER — DICYCLOMINE 10 MG/ML INTRAMUSCULAR SOLUTION
10.0000 mg | Freq: Once | INTRAMUSCULAR | Status: AC
Start: 2018-05-03 — End: 2018-05-03
  Administered 2018-05-03: 10 mg via INTRAMUSCULAR
  Filled 2018-05-03: qty 2

## 2018-05-03 MED ORDER — FAMOTIDINE (PF) 20 MG/2 ML INTRAVENOUS SOLUTION
20.00 mg | INTRAVENOUS | Status: AC
Start: 2018-05-03 — End: 2018-05-03
  Administered 2018-05-03: 20 mg via INTRAVENOUS
  Filled 2018-05-03: qty 2

## 2018-05-03 MED ORDER — SODIUM CHLORIDE 0.9 % IV BOLUS
1000.0000 mL | INJECTION | Status: AC
Start: 2018-05-03 — End: 2018-05-03
  Administered 2018-05-03: 0 mL via INTRAVENOUS
  Administered 2018-05-03: 1000 mL via INTRAVENOUS
  Filled 2018-05-03: qty 1000

## 2018-05-03 MED ORDER — METOCLOPRAMIDE 5 MG/ML INJECTION SOLUTION
5.0000 mg | INTRAMUSCULAR | Status: AC
Start: 2018-05-03 — End: 2018-05-03
  Administered 2018-05-03: 5 mg via INTRAVENOUS
  Filled 2018-05-03: qty 2

## 2018-05-03 MED ORDER — ONDANSETRON 4 MG DISINTEGRATING TABLET
4.0000 mg | ORAL_TABLET | Freq: Three times a day (TID) | ORAL | 0 refills | Status: AC | PRN
Start: 2018-05-03 — End: ?

## 2018-05-03 MED ADMIN — sodium chloride 0.9 % (flush) injection syringe: @ 17:00:00

## 2018-05-03 MED ADMIN — ondansetron HCl (PF) 4 mg/2 mL injection solution: INTRAVENOUS | @ 17:00:00

## 2018-05-03 MED ADMIN — sodium chloride 0.9 % (flush) injection syringe: INTRAVENOUS | @ 19:00:00

## 2018-05-03 MED ADMIN — ipratropium bromide 0.02 % solution for inhalation: INTRAVENOUS | @ 17:00:00

## 2018-05-03 MED ADMIN — lidocaine 5 % topical patch: INTRAVENOUS | @ 19:00:00

## 2018-05-03 NOTE — ED Nurses Note (Signed)
Pt given discharge instructions. Verbalized understanding. Pt left via amb in stable cond . States her ride is outside waiting for her

## 2018-05-03 NOTE — ED Provider Notes (Signed)
[facility]  Emergency Department Note        Date:   05/03/2018  Name: Holly Fritz  Age: 38 y.o.  MRN: [id number]      Chief Complaint: Abdominal Pain  History by patient  History of Present Illness  38 year old female with history of Ehlers-Danlos syndrome.  Coming with right upper quadrant abdominal pain she has her gallbladder intact she says she has been having some pain for the last 2 days with some episodes of nausea episode of vomiting she declines any chest pain chest tightness no shortness of breath no fever chills.  Does not have any swelling in her feet no other issues she has history of bacteremia last year but otherwise she has been okay she basically has been having this pain often on in the right upper quadrant she has had a gallbladder workup done in the past.    Past Medical History  Past Medical History:   Diagnosis Date   . Autoimmune disorder (CMS HCC)    . Chiari I malformation (CMS HCC)    . Chronic pain    . Convergence insufficiency    . Dysautonomia (CMS [place])    . Ehlers-Danlos syndrome    . Ehlers-Danlos syndrome    . Gastroparesis    . Headache         Past Surgical History:   Procedure Laterality Date   . ESOPHAGEAL DILATION     . HX MYOMECTOMY     . HX WISDOM TEETH EXTRACTION     . POSTERIOR FOSSA DECOMPRESSION              Current Outpatient Medications   Medication Sig   . aspirin-acetaminophen-caffeine (EXCEDRIN) 250-250-65 mg Oral Tablet Take 2 Tabs by mouth Every 6 hours as needed for Migraine   . capsaicin (ZOSTRIX) 0.025 % Cream by Apply Topically route Twice daily   . cetirizine (ZYRTEC) 10 mg Oral Tablet Take 10 mg by mouth Once a day   . diphenhydrAMINE (BENADRYL) 25 mg Oral Capsule Take 25 mg by mouth Once per day as needed   . hydrOXYzine pamoate (VISTARIL) 50 mg Oral Capsule Take 50 mg by mouth Three times a day as needed for Itching   . Ibuprofen (MOTRIN) 600 mg Oral Tablet Take 1 Tab (600 mg total) by mouth Four times a day as needed for Pain   .  mirtazapine (REMERON) 30 mg Oral Tablet Take 30 mg by mouth Every night   . OLANZapine (ZYPREXA) 10 mg Oral Tablet Take 10 mg by mouth Every night   . OLANZapine (ZYPREXA) 5 mg Oral Tablet Take 5 mg by mouth Every morning   . ondansetron (ZOFRAN ODT) 4 mg Oral Tablet, Rapid Dissolve Take 1 Tab (4 mg total) by mouth Every 8 hours as needed for Nausea/Vomiting   . raNITIdine (ZANTAC) 150 mg Oral Tablet Take 150 mg by mouth Once per day as needed   . sodium chloride (SALINE NOSE NASL) by Nasal route   . traZODone (DESYREL) 150 mg Oral Tablet Take 150 mg by mouth Every night     Allergies   Allergen Reactions   . Adhesive Rash   . Doxycycline    . Gabapentin  Other Adverse Reaction (Add comment)     Patient reports is "sensitive" to medication.   . Oxycodone      Patient reports "sensitivity" to medication.   Marland Kitchen Penicillins    . Tapentadol      Patient reports "sensitivity"  to medication.       Family History  Family Medical History:     None                  noncontributory    Social History     Tobacco Use   . Smoking status: Current Every Day Smoker     Packs/day: 1.50     Years: 20.00     Pack years: 30.00   . Smokeless tobacco: Never Used   Substance Use Topics   . Alcohol use: Not on file       Review of Systems  12 systems reviewed all are  negative with the exception of what is listed in the HPI. Pt in no acute distress.    Physical Examination:    BP 98/60   Pulse 81   Temp 37.2 C (98.9 F)   Resp 18   Ht 1.626 m (5\' 4" )   Wt 64.5 kg (142 lb 4.8 oz)   LMP  (Within Weeks)   SpO2 100%   BMI 24.43 kg/m         General appearance: alert, cooperative, no distress, appears stated age  Head: Normocephalic, without obvious abnormality, atraumatic  Eyes: conjunctivae/corneas clear. PERRLA  Ears: normal TM's and external ear canals clear  Nose: Nares normal. Mucosa normal.  Throat: Lips, mucosa, and tongue normal.   Neck: supple, symmetrical, trachea midline, no adenopathy  Lungs: clear to auscultation  bilaterally, no crackles, no wheezes   Heart: regular rate and rhythm, , no significant murmurs, no carotid bruits  Abdomen:  Right upper quadrant abdominal pain no guarding no rigidity rebound tenderness tenderness is present over the right upper quadrant area.  Rest of the abdominal exam was benign.  Extremities: extremities normal, atraumatic, no cyanosis or edema  Lymph nodes: Cervical, nodes normal.  Affect : pleasant and interactive.  Skin: no rash    Results for orders placed or performed during the hospital encounter of 05/03/18   COMPREHENSIVE METABOLIC PANEL, NON-FASTING   Result Value Ref Range    SODIUM 132 (L) 137 - 145 mmol/L    POTASSIUM 3.9 3.5 - 5.1 mmol/L    CHLORIDE 99 98 - 107 mmol/L    CO2 TOTAL 25 22 - 32 mmol/L    ANION GAP 8 (L) 10 - 20 mmol/L    BUN 13 8 - 25 mg/dL    CREATININE 0.60 0.49 - 1.10 mg/dL    BUN/CREA RATIO 22     ESTIMATED GFR 116 mL/min/1.14m^2    ALBUMIN 4.5 3.5 - 5.0 g/dL    CALCIUM 10.0 8.5 - 10.4 mg/dL    GLUCOSE 119 (H) 70 - 105 mg/dL    ALKALINE PHOSPHATASE 83 38 - 126 U/L    ALT (SGPT) 24 <=35 U/L    AST (SGOT) 23 14 - 36 U/L    BILIRUBIN TOTAL 0.6 0.3 - 1.3 mg/dL    PROTEIN TOTAL 7.9 6.4 - 8.3 g/dL   CREATINE KINASE (CK), TOTAL, SERUM   Result Value Ref Range    CREATINE KINASE 21 (L) 30 - 135 U/L   ETHANOL, SERUM   Result Value Ref Range    ETHANOL <10 <=10 mg/dL   HCG, SERUM QUALITATIVE, PREGNANCY   Result Value Ref Range    PREGNANCY, SERUM QUALITATIVE Negative Negative   LACTIC ACID LEVEL W/ REFLEX FOR LEVEL >2.0   Result Value Ref Range    LACTIC ACID 1.3 0.7 - 2.1 mmol/L   LIPASE  Result Value Ref Range    LIPASE 14 (L) 23 - 300 U/L   MAGNESIUM   Result Value Ref Range    MAGNESIUM 2.1 1.6 - 2.5 mg/dL   PT/INR   Result Value Ref Range    PROTHROMBIN TIME 12.3 9.4 - 12.5 seconds    INR 1.06 <=4.50   PTT (PARTIAL THROMBOPLASTIN TIME)   Result Value Ref Range    APTT 39.8 (H) 25.1 - 36.5 seconds   THYROID STIMULATING HORMONE (SENSITIVE TSH)   Result Value Ref Range      TSH 0.482 0.350 - 5.000 uIU/mL   TROPONIN-I   Result Value Ref Range    TROPONIN I <=0.01 <=0.03 ng/mL   CBC WITH DIFF   Result Value Ref Range    WBC 18.3 (H) 3.7 - 11.0 x10^3/uL    RBC 4.88 3.85 - 5.22 x10^6/uL    HGB 14.1 11.5 - 16.0 g/dL    HCT 40.8 34.8 - 46.0 %    MCV 83.6 78.0 - 100.0 fL    MCH 28.9 26.0 - 32.0 pg    MCHC 34.6 31.0 - 35.5 g/dL    RDW-CV 13.5 11.5 - 15.5 %    PLATELETS 406 (H) 150 - 400 x10^3/uL    MPV 9.2 8.7 - 12.5 fL    NEUTROPHIL % 82 %    LYMPHOCYTE % 9 %    MONOCYTE % 9 %    EOSINOPHIL % 0 %    BASOPHIL % 0 %    NEUTROPHIL # 14.88 (H) 1.50 - 7.70 x10^3/uL    LYMPHOCYTE # 1.68 1.00 - 4.80 x10^3/uL    MONOCYTE # 1.58 (H) 0.20 - 1.10 x10^3/uL    EOSINOPHIL # 0.06 <=0.50 x10^3/uL    BASOPHIL # <0.04 <=0.20 x10^3/uL    IMMATURE GRANULOCYTE % 0 0 - 1 %    IMMATURE GRANULOCYTE # 0.05 <0.10 x10^3/uL   URINALYSIS, MACROSCOPIC   Result Value Ref Range    COLOR Yellow     APPEARANCE Clear     SPECIFIC GRAVITY 1.020 1.005 - 1.030    PH 6.5 5.0 - 8.0    LEUKOCYTES Negative Negative WBCs/uL    NITRITE Negative Negative    PROTEIN Negative Negative mg/dL    GLUCOSE Negative Negative mg/dL    KETONES Negative Negative mg/dL    UROBILINOGEN 0.2  0.2 - 1.0 mg/dL    BILIRUBIN Negative Negative mg/dL    BLOOD Negative Negative mg/dL   URINALYSIS, MICROSCOPIC   Result Value Ref Range    RBCS None Occasional or less, 0-2, 1-3, None /hpf    WBCS 0-2 Occasional or less, 0-2, 1-3, None /hpf    BACTERIA 1+ (A) None /hpf    SQUAMOUS EPITHELIAL Many /hpf    MUCOUS 4+ (A) None /hpf       Assessment and Plan    ENCOUNTER DIAGNOSES     ICD-10-CM   1. Right upper quadrant abdominal pain R10.11   2. Nausea R11.0   3. Ehlers-Danlos syndrome Q79.60   4. Chiari I malformation (CMS HCC) G93.5   5. Gastroparesis K31.84   6. Calculus of gallbladder without cholecystitis without obstruction K80.20     Orders Placed This Encounter   . ADULT ROUTINE BLOOD CULTURE, SET OF 2 BOTTLES (BACTERIA AND YEAST)   . ADULT ROUTINE BLOOD  CULTURE, SET OF 2 BOTTLES (BACTERIA AND YEAST)   . URINE CULTURE,ROUTINE   . CT ABDOMEN PELVIS WO IV CONTRAST   . CBC/DIFF   .  COMPREHENSIVE METABOLIC PANEL, NON-FASTING   . CREATINE KINASE (CK), TOTAL, SERUM   . ETHANOL, SERUM   . HCG, SERUM QUALITATIVE, PREGNANCY   . LACTIC ACID LEVEL W/ REFLEX FOR LEVEL >2.0   . LIPASE   . MAGNESIUM   . PT/INR   . PTT (PARTIAL THROMBOPLASTIN TIME)   . THYROID STIMULATING HORMONE (SENSITIVE TSH)   . TROPONIN-I   . URINALYSIS, MACROSCOPIC AND MICROSCOPIC W/CULTURE REFLEX   . CBC WITH DIFF   . URINALYSIS, MACROSCOPIC   . URINALYSIS, MICROSCOPIC   . INSERT & MAINTAIN PERIPHERAL IV ACCESS   . AND Linked Order Group    . NS flush syringe    . NS flush syringe   . NS bolus infusion 1,000 mL   . ondansetron (ZOFRAN) 2 mg/mL injection   . metoclopramide (REGLAN) 5 mg/mL injection   . fentaNYL (SUBLIMAZE) 50 mcg/mL injection   . famotidine (PEPCID) 10 mg/mL injection   . fentaNYL (SUBLIMAZE) 50 mcg/mL injection   . ondansetron (ZOFRAN) 2 mg/mL injection   . dicyclomine (BENTYL) 10 mg/mL IM injection   . ondansetron (ZOFRAN ODT) 4 mg Oral Tablet, Rapid Dissolve   . Ibuprofen (MOTRIN) 600 mg Oral Tablet       Patient does not want anything done about the gallstones I gave her a copy of the CT scan which shows an enlarged gallbladder with gallstones.  I offered surgery call surgical help here her liver enzymes are fine white count is slightly elevated to 18,000 but she wants to wait till tomorrow talk with her PCP in [place] and she would prefer that she get surgery done in [facility] patient wants to go home I will give her an additional dose off pain medicines some Zofran some Bentyl and Pepcid before we released her patient understands agrees advised her to eat low-fat he foods.      Dyanne Iha, MD

## 2018-05-03 NOTE — ED Nurses Note (Signed)
Pt amb to bathroom. Did well. ua obtained

## 2018-05-03 NOTE — ED Nurses Note (Signed)
Pt amb to bathroom. Gait steady

## 2018-05-03 NOTE — ED Triage Notes (Signed)
C/o RUQ pain that started three days ago and has been intermittent until 7pm and since then has been constant. Reports episode of emesis once three days ago with none since.

## 2018-05-04 ENCOUNTER — Ambulatory Visit: Payer: 59 | Attending: Surgical Critical Care | Admitting: Surgical Critical Care

## 2018-05-04 ENCOUNTER — Encounter (HOSPITAL_BASED_OUTPATIENT_CLINIC_OR_DEPARTMENT_OTHER): Payer: Self-pay | Admitting: Surgical Critical Care

## 2018-05-04 DIAGNOSIS — K8020 Calculus of gallbladder without cholecystitis without obstruction: Secondary | ICD-10-CM | POA: Insufficient documentation

## 2018-05-04 DIAGNOSIS — K819 Cholecystitis, unspecified: Principal | ICD-10-CM | POA: Insufficient documentation

## 2018-05-04 NOTE — Nursing Note (Signed)
Patient is negative for pre anesthesia screening.

## 2018-05-04 NOTE — H&P (Signed)
[facility]   [address]  [place], [place] [address]  [phone]    H&P Note    Patient:  Holly Fritz  D.O.B.:  05/18/1980  MRN#  [id number]  Date of Service:  05/04/2018  Referring Provider:  No ref. provider found    PCP:  Eddie Dibbles, MD    Chief Complaint:    Chief Complaint   Patient presents with   . Gall Bladder     HPI (05/04/2018):  Holly Fritz is a 38 y.o. White female with history of Ehlers-Danlos syndrome who presents for me to render an opinion on her Gall Bladder.  The patient was seen in the ED on 05/03/2018 with right upper quadrant abdominal pain.  She states that her gallbladder was intact.  She said she had some pain for the 2 days prior with some episodes of nausea and an episode of vomiting.  She declined any chest pain, chest tightness, shortness of breath, fever, or chills.  Patient did not have any swelling in her feet.  Hx of bacteremia last year.  Stated she had a gallbladder workup done in the past.  Patient had a CT scan which revealed an enlarged gallbladder with gallstones.  White count was slightly elevated to 18,000.  Today, the patient reports RUQ to R flank, abdominal pain, gas, floating stool, with episodes of N/V.  She denies fever.  The patient reports no other complaints or concerns at this time.      Current Outpatient Prescriptions:  Current Outpatient Medications   Medication Sig   . aspirin-acetaminophen-caffeine (EXCEDRIN) 250-250-65 mg Oral Tablet Take 2 Tabs by mouth Every 6 hours as needed for Migraine   . capsaicin (ZOSTRIX) 0.025 % Cream by Apply Topically route Twice daily   . cetirizine (ZYRTEC) 10 mg Oral Tablet Take 10 mg by mouth Once a day   . diphenhydrAMINE (BENADRYL) 25 mg Oral Capsule Take 25 mg by mouth Once per day as needed   . Ibuprofen (MOTRIN) 600 mg Oral Tablet Take 1 Tab (600 mg total) by mouth Four times a day as needed for Pain   . mirtazapine (REMERON) 30 mg Oral Tablet Take 30 mg by  mouth Every night   . OLANZapine (ZYPREXA) 10 mg Oral Tablet Take 10 mg by mouth Every night   . OLANZapine (ZYPREXA) 5 mg Oral Tablet Take 5 mg by mouth Every morning   . ondansetron (ZOFRAN ODT) 4 mg Oral Tablet, Rapid Dissolve Take 1 Tab (4 mg total) by mouth Every 8 hours as needed for Nausea/Vomiting   . sodium chloride (SALINE NOSE NASL) by Nasal route     Allergies:  Allergies   Allergen Reactions   . Adhesive Rash   . Doxycycline    . Gabapentin  Other Adverse Reaction (Add comment)     Patient reports is "sensitive" to medication.   . Oxycodone      Patient reports "sensitivity" to medication.   Marland Kitchen Penicillins    . Tapentadol      Patient reports "sensitivity" to medication.     Past Medical History:  Past Medical History:   Diagnosis Date   . Autoimmune disorder (CMS HCC)    . Chiari I malformation (CMS HCC)    . Chronic pain    . Convergence insufficiency    . Dysautonomia (CMS [place])    . Ehlers-Danlos syndrome    . Ehlers-Danlos syndrome    . Gastroparesis    .  Headache      Past Surgical History:  Past Surgical History:   Procedure Laterality Date   . Esophageal dilation     . Hx myomectomy     . Hx wisdom teeth extraction     . Posterior fossa decompression       Family History:  Family Medical History:     None        Social History:  Social History     Occupational History   . Not on file   Tobacco Use   . Smoking status: Current Every Day Smoker     Packs/day: 1.50     Years: 20.00     Pack years: 30.00   . Smokeless tobacco: Never Used   Substance and Sexual Activity   . Alcohol use: Not on file   . Drug use: Yes     Types: Marijuana   . Sexual activity: Not on file     Review of Systems:  General:  Denies fever/chills.  Denies changes in weight.  Denies malaise/fatigue.  Dermatologic:  Denies rashes, itching, drainage, masses, or chronic sores.  HEENT:  Denies recent head trauma.  Denies blurry vision, diplopia, or photophobia.  Denies ocular drainage, pain, or redness.  Denies tinnitus, hearing  loss, auricular pain, or discharge.  Denies nosebleeds or chronic congestion.  Denies stridor/chronic sore throat.  Cardiovascular:  Denies chest pain, palpitations, orthopnea, claudication, PND, or leg swelling.    Respiratory:  Denies SOB, chronic cough, or wheezing.  Denies sputum production.  Denies hemoptysis.    Gastrointestinal:   Denies constipation.  Denies diarrhea.  Denies any change in bowel movements.  Denies blood in stool or melena.  Reports RUQ to R flank abdominal pain.  Reports N/V.  Reports gas.  Reports floating stool.    Genitourinary:  Denies dysuria, urgency, change in urinary habits, or hematuria.  Denies flank pain.    Musculoskeletal:  Denies neck pain.  Denies muscle pain, weakness, or joint pain.  Denies trouble moving extremities.  Denies back pain.  Denies chronic falling.  Hematologic:  Denies easy bruising.  Denies history of bleeding disorders or blood clots.  Denies chronically swollen lymph nodes.    Endocrine:  Denies polydipsia or polyuria.  Denies thyroid problems.    Neurological:  Denies headache.  Denies dizziness.  Denies room spinning sensation.  Denies changes in sensation, speech, or focal weakness.  Denies LOC.   Psychiatric:  Denies changes in mood, anxiety, or depression.  Denies hallucinations.  Denies suicidal ideas.  Denies homicidal ideas.  Denies substance abuse/history of substance abuse.  Denies insomnia.  Denies memory loss.    Objective:  There were no vitals taken for this visit.    Physical Exam:  Constitutional:  WDWN.  NAD.    HEENT:  Normocephalic.  Atraumatic.  Anicteric.  PEERL.  EOMI.  NMT-normal.    Neck:  Trachea midline.  Supple.  No masses or thyromegaly.    Pulmonary:  Normal effort and rate observed.  Breath sounds clear.    Cardiovascular:  NSR.  No M, R, G.  No bruits.  3/6 systolic ejection murmer over R sternal border.  Abdomen:  Non-distended or tympanitic.  Bowel sounds normal and active in all 4 quadrants.  Abdomen soft and supple.   Tenderness is present to the transverse and descending colon.  Guarding and rebound are present.  No masses or organomegaly are present.  Hernia(s) is/are absent.  US showed dilated gallbladder with large stone.  Positive Murphy's sign over gallbladder.   Extremities:  No peripheral edema.  No cyanosis or clubbing of nails.  Venous varicosities are absent.    Musculoskeletal:  Normal muscle strength and tone of all four extremities.  Pulses are present.    Skin:  Warm and dry.  No rashes or lesions are present.  Ulcerations are absent.  No jaundice.    Psychiatric:  Normal mood and affect.  Judgement and thought content normal.      Assessment:    ICD-10-CM    1. Cholecystitis K81.9    2. Cholelithiasis K80.20        Plan:   We discussed the surgery at length including risks, benefits, and alternatives: laparoscopy cholecystectomy.   The risks discussed include, but are not limited to, bleeding, bile leak, infection, perforation, death due to anesthesia, and injury to nearby structures, such as the bile duct, liver and small intestine.      Jadamarie is to return to clinic for follow-up after her procedure; or sooner, as needed.  Takima was given the opportunity to ask questions, and those questions were appropriately answered.  Maizie agreed with the treatment plan and was encouraged to call with any additional questions or concerns.      I spent more than 50% of the 30-minute visit with her discussing her disease process and various treatments, as well as the risks, benefits, and alternatives.      No orders of the defined types were placed in this encounter.    I am scribing for, and in the presence of, Ples Specter. Myriam Jacobson, MD, for services provided on 05/04/2018.  Lyndee Leo Soucier, [place], SCRIBE  05/04/2018, 13:46  I personally performed the services described in this documentation, as scribed  in my presence, and it is both accurate  and complete.    Audria Nine, MD  *DO NOT [place]*

## 2018-05-04 NOTE — H&P (View-Only) (Signed)
[facility]   [address]  [place], [place] [address]  [phone]    H&P Note    Patient:  Holly Fritz  D.O.B.:  [birth year]  MRN#  [id number]  Date of Service:  05/04/2018  Referring Provider:  No ref. provider found    PCP:  Eddie Dibbles, MD    Chief Complaint:    Chief Complaint   Patient presents with   . Gall Bladder     HPI (05/04/2018):  Julietta Jesscia Imm is a 38 y.o. White female with history of Ehlers-Danlos syndrome who presents for me to render an opinion on her Gall Bladder.  The patient was seen in the ED on 05/03/2018 with right upper quadrant abdominal pain.  She states that her gallbladder was intact.  She said she had some pain for the 2 days prior with some episodes of nausea and an episode of vomiting.  She declined any chest pain, chest tightness, shortness of breath, fever, or chills.  Patient did not have any swelling in her feet.  Hx of bacteremia last year.  Stated she had a gallbladder workup done in the past.  Patient had a CT scan which revealed an enlarged gallbladder with gallstones.  White count was slightly elevated to 18,000.  Today, the patient reports RUQ to R flank, abdominal pain, gas, floating stool, with episodes of N/V.  She denies fever.  The patient reports no other complaints or concerns at this time.      Current Outpatient Prescriptions:  Current Outpatient Medications   Medication Sig   . aspirin-acetaminophen-caffeine (EXCEDRIN) 250-250-65 mg Oral Tablet Take 2 Tabs by mouth Every 6 hours as needed for Migraine   . capsaicin (ZOSTRIX) 0.025 % Cream by Apply Topically route Twice daily   . cetirizine (ZYRTEC) 10 mg Oral Tablet Take 10 mg by mouth Once a day   . diphenhydrAMINE (BENADRYL) 25 mg Oral Capsule Take 25 mg by mouth Once per day as needed   . Ibuprofen (MOTRIN) 600 mg Oral Tablet Take 1 Tab (600 mg total) by mouth Four times a day as needed for Pain   . mirtazapine (REMERON) 30 mg Oral Tablet Take 30 mg by  mouth Every night   . OLANZapine (ZYPREXA) 10 mg Oral Tablet Take 10 mg by mouth Every night   . OLANZapine (ZYPREXA) 5 mg Oral Tablet Take 5 mg by mouth Every morning   . ondansetron (ZOFRAN ODT) 4 mg Oral Tablet, Rapid Dissolve Take 1 Tab (4 mg total) by mouth Every 8 hours as needed for Nausea/Vomiting   . sodium chloride (SALINE NOSE NASL) by Nasal route     Allergies:  Allergies   Allergen Reactions   . Adhesive Rash   . Doxycycline    . Gabapentin  Other Adverse Reaction (Add comment)     Patient reports is "sensitive" to medication.   . Oxycodone      Patient reports "sensitivity" to medication.   Marland Kitchen Penicillins    . Tapentadol      Patient reports "sensitivity" to medication.     Past Medical History:  Past Medical History:   Diagnosis Date   . Autoimmune disorder (CMS HCC)    . Chiari I malformation (CMS HCC)    . Chronic pain    . Convergence insufficiency    . Dysautonomia (CMS [place])    . Ehlers-Danlos syndrome    . Ehlers-Danlos syndrome    . Gastroparesis    .  Headache      Past Surgical History:  Past Surgical History:   Procedure Laterality Date   . Esophageal dilation     . Hx myomectomy     . Hx wisdom teeth extraction     . Posterior fossa decompression       Family History:  Family Medical History:     None        Social History:  Social History     Occupational History   . Not on file   Tobacco Use   . Smoking status: Current Every Day Smoker     Packs/day: 1.50     Years: 20.00     Pack years: 30.00   . Smokeless tobacco: Never Used   Substance and Sexual Activity   . Alcohol use: Not on file   . Drug use: Yes     Types: Marijuana   . Sexual activity: Not on file     Review of Systems:  General:  Denies fever/chills.  Denies changes in weight.  Denies malaise/fatigue.  Dermatologic:  Denies rashes, itching, drainage, masses, or chronic sores.  HEENT:  Denies recent head trauma.  Denies blurry vision, diplopia, or photophobia.  Denies ocular drainage, pain, or redness.  Denies tinnitus, hearing  loss, auricular pain, or discharge.  Denies nosebleeds or chronic congestion.  Denies stridor/chronic sore throat.  Cardiovascular:  Denies chest pain, palpitations, orthopnea, claudication, PND, or leg swelling.    Respiratory:  Denies SOB, chronic cough, or wheezing.  Denies sputum production.  Denies hemoptysis.    Gastrointestinal:   Denies constipation.  Denies diarrhea.  Denies any change in bowel movements.  Denies blood in stool or melena.  Reports RUQ to R flank abdominal pain.  Reports N/V.  Reports gas.  Reports floating stool.    Genitourinary:  Denies dysuria, urgency, change in urinary habits, or hematuria.  Denies flank pain.    Musculoskeletal:  Denies neck pain.  Denies muscle pain, weakness, or joint pain.  Denies trouble moving extremities.  Denies back pain.  Denies chronic falling.  Hematologic:  Denies easy bruising.  Denies history of bleeding disorders or blood clots.  Denies chronically swollen lymph nodes.    Endocrine:  Denies polydipsia or polyuria.  Denies thyroid problems.    Neurological:  Denies headache.  Denies dizziness.  Denies room spinning sensation.  Denies changes in sensation, speech, or focal weakness.  Denies LOC.   Psychiatric:  Denies changes in mood, anxiety, or depression.  Denies hallucinations.  Denies suicidal ideas.  Denies homicidal ideas.  Denies substance abuse/history of substance abuse.  Denies insomnia.  Denies memory loss.    Objective:  There were no vitals taken for this visit.    Physical Exam:  Constitutional:  WDWN.  NAD.    HEENT:  Normocephalic.  Atraumatic.  Anicteric.  PEERL.  EOMI.  NMT-normal.    Neck:  Trachea midline.  Supple.  No masses or thyromegaly.    Pulmonary:  Normal effort and rate observed.  Breath sounds clear.    Cardiovascular:  NSR.  No M, R, G.  No bruits.  3/6 systolic ejection murmer over R sternal border.  Abdomen:  Non-distended or tympanitic.  Bowel sounds normal and active in all 4 quadrants.  Abdomen soft and supple.   Tenderness is present to the transverse and descending colon.  Guarding and rebound are present.  No masses or organomegaly are present.  Hernia(s) is/are absent.  US showed dilated gallbladder with large stone.  Positive Murphy's sign over gallbladder.   Extremities:  No peripheral edema.  No cyanosis or clubbing of nails.  Venous varicosities are absent.    Musculoskeletal:  Normal muscle strength and tone of all four extremities.  Pulses are present.    Skin:  Warm and dry.  No rashes or lesions are present.  Ulcerations are absent.  No jaundice.    Psychiatric:  Normal mood and affect.  Judgement and thought content normal.      Assessment:    ICD-10-CM    1. Cholecystitis K81.9    2. Cholelithiasis K80.20        Plan:   We discussed the surgery at length including risks, benefits, and alternatives: laparoscopy cholecystectomy.   The risks discussed include, but are not limited to, bleeding, bile leak, infection, perforation, death due to anesthesia, and injury to nearby structures, such as the bile duct, liver and small intestine.      Garnette is to return to clinic for follow-up after her procedure; or sooner, as needed.  Karyna was given the opportunity to ask questions, and those questions were appropriately answered.  Aryaa agreed with the treatment plan and was encouraged to call with any additional questions or concerns.      I spent more than 50% of the 30-minute visit with her discussing her disease process and various treatments, as well as the risks, benefits, and alternatives.      No orders of the defined types were placed in this encounter.    I am scribing for, and in the presence of, Ples Specter. Myriam Jacobson, MD, for services provided on 05/04/2018.  Lyndee Leo Soucier, [place], SCRIBE  05/04/2018, 13:46  I personally performed the services described in this documentation, as scribed  in my presence, and it is both accurate  and complete.    Audria Nine, MD  *DO NOT [place]*

## 2018-05-05 ENCOUNTER — Ambulatory Visit (HOSPITAL_COMMUNITY): Payer: 59 | Admitting: Medical-Surgical

## 2018-05-05 ENCOUNTER — Encounter (HOSPITAL_COMMUNITY)
Admission: RE | Disposition: A | Discharge status: Home / Self Care | Payer: Self-pay | Source: Home / Self Care | Attending: Surgical Critical Care

## 2018-05-05 ENCOUNTER — Inpatient Hospital Stay
Admission: RE | Admit: 2018-05-05 | Discharge: 2018-05-05 | Disposition: A | Discharge status: Home / Self Care | Payer: 59 | Attending: Surgical Critical Care | Admitting: Surgical Critical Care

## 2018-05-05 ENCOUNTER — Other Ambulatory Visit (FREE_STANDING_LABORATORY_FACILITY): Payer: Self-pay | Admitting: Surgical Critical Care

## 2018-05-05 ENCOUNTER — Other Ambulatory Visit: Payer: Self-pay

## 2018-05-05 DIAGNOSIS — Q796 Ehlers-Danlos syndrome, unspecified: Secondary | ICD-10-CM | POA: Insufficient documentation

## 2018-05-05 DIAGNOSIS — F17210 Nicotine dependence, cigarettes, uncomplicated: Secondary | ICD-10-CM | POA: Insufficient documentation

## 2018-05-05 DIAGNOSIS — D225 Melanocytic nevi of trunk: Secondary | ICD-10-CM | POA: Insufficient documentation

## 2018-05-05 DIAGNOSIS — K8010 Calculus of gallbladder with chronic cholecystitis without obstruction: Secondary | ICD-10-CM | POA: Insufficient documentation

## 2018-05-05 LAB — URINE CULTURE,ROUTINE: URINE CULTURE: <10000

## 2018-05-05 SURGERY — CHOLECYSTECTOMY LAPAROSCOPIC (SILS)
Anesthesia: General | Wound class: Clean Contaminated Wounds-The respiratory, GI, Genital, or urinary

## 2018-05-05 MED ORDER — HYDROMORPHONE 2 MG/ML INJECTION SOLUTION
0.5000 mg | INTRAMUSCULAR | Status: DC | PRN
Start: 2018-05-05 — End: 2018-05-05

## 2018-05-05 MED ORDER — LACTATED RINGERS INTRAVENOUS SOLUTION
INTRAVENOUS | Status: DC
Start: 2018-05-05 — End: 2018-05-05

## 2018-05-05 MED ORDER — ONDANSETRON HCL (PF) 4 MG/2 ML INJECTION SOLUTION
Freq: Once | INTRAMUSCULAR | Status: DC | PRN
Start: 2018-05-05 — End: 2018-05-05
  Administered 2018-05-05: 4 mg via INTRAVENOUS

## 2018-05-05 MED ORDER — ONDANSETRON HCL (PF) 4 MG/2 ML INJECTION SOLUTION
4.0000 mg | Freq: Four times a day (QID) | INTRAMUSCULAR | Status: DC | PRN
Start: 2018-05-05 — End: 2018-05-05

## 2018-05-05 MED ORDER — SODIUM CHLORIDE 0.9 % (FLUSH) INJECTION SYRINGE
3.0000 mL | INJECTION | Freq: Three times a day (TID) | INTRAMUSCULAR | Status: DC
Start: 2018-05-05 — End: 2018-05-05
  Filled 2018-05-05: qty 3

## 2018-05-05 MED ORDER — HYDROMORPHONE 2 MG TABLET
2.0000 mg | ORAL_TABLET | ORAL | 0 refills | Status: AC | PRN
Start: 2018-05-05 — End: 2018-05-12

## 2018-05-05 MED ORDER — DEXAMETHASONE SODIUM PHOSPHATE 4 MG/ML INJECTION SOLUTION
Freq: Once | INTRAMUSCULAR | Status: DC | PRN
Start: 2018-05-05 — End: 2018-05-05
  Administered 2018-05-05: 10 mg via INTRAVENOUS

## 2018-05-05 MED ORDER — HYDROMORPHONE 2 MG/ML INJECTION SOLUTION
0.50 mg | INTRAMUSCULAR | Status: DC | PRN
Start: 2018-05-05 — End: 2018-05-05
  Administered 2018-05-05: 0.5 mg via INTRAVENOUS

## 2018-05-05 MED ORDER — SODIUM CHLORIDE 0.9 % (FLUSH) INJECTION SYRINGE
3.0000 mL | INJECTION | INTRAMUSCULAR | Status: DC | PRN
Start: 2018-05-05 — End: 2018-05-05

## 2018-05-05 MED ORDER — LACTATED RINGERS INTRAVENOUS SOLUTION
INTRAVENOUS | Status: DC | PRN
Start: 2018-05-05 — End: 2018-05-05

## 2018-05-05 MED ORDER — PROPOFOL 10 MG/ML IV BOLUS
INJECTION | Freq: Once | INTRAVENOUS | Status: DC | PRN
Start: 2018-05-05 — End: 2018-05-05
  Administered 2018-05-05: 200 mg via INTRAVENOUS

## 2018-05-05 MED ORDER — HYDROMORPHONE 2 MG/ML INJECTION SOLUTION
0.5000 mg | INTRAMUSCULAR | Status: DC | PRN
Start: 2018-05-05 — End: 2018-05-05
  Filled 2018-05-05: qty 1

## 2018-05-05 MED ORDER — MIDAZOLAM 5 MG/ML INJECTION SOLUTION
Freq: Once | INTRAMUSCULAR | Status: DC | PRN
Start: 2018-05-05 — End: 2018-05-05
  Administered 2018-05-05: 2.5 mg via INTRAVENOUS

## 2018-05-05 MED ORDER — LIDOCAINE (PF) 100 MG/5 ML (2 %) INTRAVENOUS SYRINGE
INJECTION | Freq: Once | INTRAVENOUS | Status: DC | PRN
Start: 2018-05-05 — End: 2018-05-05
  Administered 2018-05-05: 80 mg via INTRAVENOUS

## 2018-05-05 MED ORDER — NEOSTIGMINE METHYLSULFATE 1 MG/ML INTRAVENOUS SOLUTION
Freq: Once | INTRAVENOUS | Status: DC | PRN
Start: 2018-05-05 — End: 2018-05-05
  Administered 2018-05-05: 4 mg via INTRAVENOUS

## 2018-05-05 MED ORDER — SODIUM CHLORIDE 0.9 % INTRAVENOUS PIGGYBACK
1.0000 g | Freq: Once | INTRAVENOUS | Status: AC
Start: 2018-05-05 — End: 2018-05-05
  Administered 2018-05-05: 1 g via INTRAVENOUS
  Filled 2018-05-05: qty 10

## 2018-05-05 MED ORDER — GLYCOPYRROLATE 0.2 MG/ML INJECTION SOLUTION
Freq: Once | INTRAMUSCULAR | Status: DC | PRN
Start: 2018-05-05 — End: 2018-05-05
  Administered 2018-05-05: 0.6 mg via INTRAVENOUS

## 2018-05-05 MED ORDER — LIDOCAINE HCL 4 % LARYNGOTRACHEAL SOLUTION
Freq: Once | LARYNGEAL | Status: DC | PRN
Start: 2018-05-05 — End: 2018-05-05
  Administered 2018-05-05: 1 via LARYNGEAL

## 2018-05-05 MED ORDER — LACTATED RINGERS INTRAVENOUS SOLUTION
INTRAVENOUS | Status: DC
Start: 2018-05-05 — End: 2018-05-05
  Administered 2018-05-05: 0 via INTRAVENOUS
  Filled 2018-05-05: qty 1000

## 2018-05-05 MED ORDER — KETOROLAC 30 MG/ML (1 ML) INJECTION SOLUTION
Freq: Once | INTRAMUSCULAR | Status: DC | PRN
Start: 2018-05-05 — End: 2018-05-05
  Administered 2018-05-05: 30 mg via INTRAVENOUS

## 2018-05-05 MED ORDER — METOCLOPRAMIDE 5 MG/ML INJECTION SOLUTION
10.0000 mg | Freq: Once | INTRAMUSCULAR | Status: DC | PRN
Start: 2018-05-05 — End: 2018-05-05

## 2018-05-05 MED ORDER — HYDROMORPHONE 2 MG/ML INJECTION SOLUTION
0.2000 mg | INTRAMUSCULAR | Status: DC | PRN
Start: 2018-05-05 — End: 2018-05-05

## 2018-05-05 MED ORDER — SODIUM CHLORIDE 0.9 % (FLUSH) INJECTION SYRINGE
3.0000 mL | INJECTION | Freq: Three times a day (TID) | INTRAMUSCULAR | Status: DC
Start: 2018-05-05 — End: 2018-05-05

## 2018-05-05 MED ORDER — SODIUM CHLORIDE 0.9 % (FLUSH) INJECTION SYRINGE
3.0000 mL | INJECTION | INTRAMUSCULAR | Status: DC | PRN
Start: 2018-05-05 — End: 2018-05-05
  Filled 2018-05-05: qty 3

## 2018-05-05 MED ORDER — BUPIVACAINE-EPINEPHRINE (PF) 0.5 %-1:200,000 INJECTION SOLUTION
Freq: Once | INTRAMUSCULAR | Status: DC | PRN
Start: 2018-05-05 — End: 2018-05-05
  Administered 2018-05-05: 24 mL via INTRAMUSCULAR

## 2018-05-05 MED ORDER — BUPIVACAINE-EPINEPHRINE 0.5 %-1:200,000 INJECTION SOLUTION
INTRAMUSCULAR | Status: DC
Start: 2018-05-05 — End: 2018-05-05
  Filled 2018-05-05: qty 50

## 2018-05-05 MED ORDER — HYDROMORPHONE 2 MG/ML INJECTION SOLUTION
Freq: Once | INTRAMUSCULAR | Status: DC | PRN
Start: 2018-05-05 — End: 2018-05-05
  Administered 2018-05-05 (×4): 0.5 mg via INTRAVENOUS

## 2018-05-05 MED ORDER — FENTANYL (PF) 50 MCG/ML INJECTION SOLUTION
Freq: Once | INTRAMUSCULAR | Status: DC | PRN
Start: 2018-05-05 — End: 2018-05-05
  Administered 2018-05-05 (×2): 25 ug via INTRAVENOUS
  Administered 2018-05-05: 50 ug via INTRAVENOUS
  Administered 2018-05-05: 100 ug via INTRAVENOUS

## 2018-05-05 MED ORDER — ROCURONIUM 10 MG/ML INTRAVENOUS SOLUTION
Freq: Once | INTRAVENOUS | Status: DC | PRN
Start: 2018-05-05 — End: 2018-05-05
  Administered 2018-05-05: 30 mg via INTRAVENOUS

## 2018-05-05 MED ADMIN — dexAMETHasone sodium phosphate 4 mg/mL injection solution: INTRAVENOUS | @ 09:00:00

## 2018-05-05 MED ADMIN — lidocaine (PF) 100 mg/5 mL (2 %) intravenous syringe: INTRAVENOUS | @ 09:00:00

## 2018-05-05 MED ADMIN — bupivacaine-epinephrine 0.5 %-1:200,000 injection solution: @ 08:00:00

## 2018-05-05 MED ADMIN — lactated Ringers intravenous solution: INTRAVENOUS | @ 10:00:00

## 2018-05-05 MED ADMIN — lactated Ringers intravenous solution: INTRAVENOUS | @ 14:00:00

## 2018-05-05 MED ADMIN — HYDROmorphone 2 mg/mL injection solution: INTRAVENOUS | @ 10:00:00

## 2018-05-05 MED ADMIN — lidocaine HCL 4 % laryngotracheal solution: LARYNGEAL | @ 09:00:00

## 2018-05-05 MED ADMIN — sodium chloride 0.9 % (flush) injection syringe: @ 14:00:00

## 2018-05-05 MED ADMIN — sodium chloride 0.9 % (flush) injection syringe: @ 08:00:00

## 2018-05-05 MED ADMIN — glycopyrrolate 0.2 mg/mL injection solution: INTRAVENOUS | @ 10:00:00

## 2018-05-05 MED ADMIN — bupivacaine-epinephrine (PF) 0.5 %-1:200,000 injection solution: INTRAMUSCULAR | @ 10:00:00

## 2018-05-05 MED ADMIN — sodium chloride 0.9 % (flush) injection syringe: INTRAVENOUS | @ 10:00:00

## 2018-05-05 SURGICAL SUPPLY — 48 items
APPL 70% ISPRP 2% CHG 26ML CHLRPRP HI-LT ORNG PREP STRL LF  DISP CLR (WOUND CARE SUPPLY) ×1 IMPLANT
APPLICATOR COT TIP 6IN STRL MDS202000 200/BX (WOUND CARE SUPPLY) ×1 IMPLANT
APPLICATOR COT TIP 6IN STRL MDS202000 200/BX (WOUND CARE/ENTEROSTOMAL SUPPLY) ×1
APPLIER LIGACLIP 11.4IN 10MM ROT MLT 20 CLIP TI MED LRG INTERNAL CLIP VAS STRL LF  DISP ENDOS (SUTURE/WOUND CLOSURE) ×1 IMPLANT
BANDAGE CURAD SPOT PL .88IN AD_H NTR STRL LF (WOUND CARE/ENTEROSTOMAL SUPPLY) ×8
BLADE 15 BD RB-BCK CBNSTL SURG TISS STRL LF  DISP (SURGICAL CUTTING SUPPLIES) ×1 IMPLANT
CLOSURE SKIN STRIPS 1/2X4IN R1547 6/PK 50PK/BX (WOUND CARE/ENTEROSTOMAL SUPPLY) ×1
CONV USE 48149 - SYRINGE MONOJECT 12ML LF  STRL LL TIP MED POLYPROP DISP (Syringes w/ o Needles) ×2 IMPLANT
CONV USE ITEM 156524 - ADHESIVE TISSUE EXOFIN 1.0ML_PREMIERPRO EXOFIN (SEALANTS) ×1 IMPLANT
CONV USE ITEM 32291 - NEEDLE INSFL 120MM 14GA EPTH SPRG LD BLUNT STY STOPCOCK LL CONN STRL DISP PNMPRTN SS PLASTIC (NEEDLES & SYRINGE SUPPLIES) ×1 IMPLANT
COUNTER 20 CNT MAG DVN BOXLOCKS STRL LF  BLK SHARP 1820 PLASTIC FOAM LRG DISP (NEEDLES & SYRINGE SUPPLIES) ×1 IMPLANT
DISCONTINUED USE ITEM 97903 - SUTURE 3-0 SH VICRYL 27IN UNDYED BRD COAT ABS (SUTURE/WOUND CLOSURE) ×1 IMPLANT
DRAPE CARM POLY STRAP MBL XRY 72X42IN LF  EQP (DRAPE/PACKS/SHEETS/OR TOWEL) IMPLANT
DRESS TRNSPR 4.75X4IN POLYUR ADH HYPOALL WTPRF TGDRM STD STRL LF (WOUND CARE SUPPLY) ×1 IMPLANT
DUPE USE ITEM 319512 - PACK LAPAROSCOPIC ABD_DYNJP3100 6EA/CS (PROTECTIVE PRODUCTS/GARMENTS) ×1 IMPLANT
ELECTRODE ESURG BLADE PNCL 10FT EDGE STRL PVC DISP BUTTON SWH CORD HLSTR LF  ACPT 3/32IN STD SHAFT (CAUTERY SUPPLIES) ×1 IMPLANT
ELECTRODE PATIENT RTN 9FT VLAB C30- LB RM PHSV ACRL FOAM CORD NONIRRITATE NONSENSITIZE ADH STRP (CAUTERY SUPPLIES) ×1 IMPLANT
ENDOCATCH 10MM SPEC POUCH 173050G 6EA/BX (INSTRUMENTS ENDOMECHANICAL)
GARMENT COMPRESS MED CALF CENT AURA NYL VASOGRAD LTWT BRTHBL (ORTHOPEDICS (NOT IMPLANTS)) ×1
GARMENT COMPRESS MED CALF CENTAURA NYL VASOGRAD LTWT BRTHBL SEQ FIL BLU 18- IN (ORTHOPEDICS (NOT IMPLANTS)) ×1 IMPLANT
INSTR SUCT SIG 18FR 30CM PREATTACH TUBE ON OFF CONTROL SGSCP CSCP 6FT STRL DISP (Suction) ×1 IMPLANT
IRR SUCT STRKFL2 TIP STRL LF  DISP (IRR) IMPLANT
IRR SUCT STRKFL2 TIP STRL LF_DISP (IRR)
KIT ANFG FOAM RADOPQ SOL SPONG_E SFT PK LF (KITS & TRAYS (DISPOSABLE)) ×1
NEEDLE HYPO  18GA 1.5IN REG WL SFGLD POLYPROP REG BVL LL SHIELD MECH DEHP-FR IM PNK STRL LF  DISP (NEEDLES & SYRINGE SUPPLIES) ×1 IMPLANT
NEEDLE HYPO  25GA 1.5IN REG WL PRCSNGL SS POLYPROP REG BVL LL HUB DEHP-FR BLU STRL LF  DISP (NEEDLES & SYRINGE SUPPLIES) ×2 IMPLANT
NEEDLE HYPO 18GA 1.5IN REG WL SFGLD POLYPROP LL HUB SHIELD (NEEDLES & SYRINGE SUPPLIES) ×1
NEEDLE INSFL 120MM 14GA EPTH S PRG LD BLUNT STY STOPCOCK LL (NEEDLES & SYRINGE SUPPLIES) ×1
PAD RELEASE 3 X 4 IN_1050 50/BX (WOUND CARE SUPPLY) ×1 IMPLANT
POUCH SPEC RETR 34.5CM 10MM E-CTCH GLD POLYUR ERG HNDL LONG CYL TUBE 29.5CM LAPSCP LF  DISP (ENDOSCOPIC SUPPLIES) IMPLANT
SCISSOR MONOPOLAR 33CM (SURGICAL INSTRUMENTS) ×1 IMPLANT
SMOKEVAC PLUMEPORT ACTIV PLUME FLTR FLOW ADJ VALVE LL FIT UNIV SYSTEM LAPSCP (MISCELLANEOUS PT CARE ITEMS) ×1 IMPLANT
SOL ANFG FLUID RADOPQ SOL SPONGE LF (KITS & TRAYS (DISPOSABLE)) ×1 IMPLANT
SOL IRRG 0.9% NACL 1000ML PVC FREE DEHP-FR NONDRIP PR STRL PIC (SOLUTIONS) ×1 IMPLANT
SPONGE GAUZE 4X4IN XD STRL 7317 VISTEC 10/TY 128TY/CS (WOUND CARE/ENTEROSTOMAL SUPPLY) ×1
SPONGE SURG 4X4IN 16 PLY RADOPQ BAND VISTEC STRL LF  BLU WHT (WOUND CARE SUPPLY) ×1 IMPLANT
STRIP 4X.5IN STRSTRP PLSTR REINF SKNCLS WHT STRL LF (WOUND CARE SUPPLY) ×1 IMPLANT
SUTURE 3-0 SH VICRYL 27IN UNDYED BRD COAT ABS (SUTURE/WOUND CLOSURE) ×1
SYRINGE 60ML LF  STRL LID SFT BULB TIP IRRG TVK (NEEDLES & SYRINGE SUPPLIES) ×1 IMPLANT
SYRINGE LL 30ML LF  STRL CONCEN TIP GRAD N-PYRG DEHP-FR MED DISP (NEEDLES & SYRINGE SUPPLIES) IMPLANT
SYRINGE LL 30ML LF STRL CONCE_N TIP GRAD N-PYRG DEHP-FR MED (NEEDLES & SYRINGE SUPPLIES)
SYSTEM SMKEVC PLUME FLTR FLW ADJ VLV LL FIT PLUMEPORT ACTIV (MISCELLANEOUS PT CARE ITEMS) ×1
TROCAR LAPSCP 100MM 12MM EPTH XCL STAB SLEEVE BLDLS OBTURATOR OPTC TIP STRL LF  DISP (ENDOSCOPIC SUPPLIES) ×1 IMPLANT
TROCAR LAPSCP STD 100MM 5MM VERSAONE FIX CANN BLDLS DLPHN NOSE TIP OPTC STRL LF  DISP (TROC) ×3 IMPLANT
TUBE PNEUMO HEATED BX/10_0620040690 (Connecting Tubes/Misc) ×1
TUBING INSFL PNEUMOSURE THRMPLST 12.52X5.83IN C19.84LB 150VA 18.7IN 50104F HEAT HI FLOW RATE SET (Connecting Tubes/Misc) ×1 IMPLANT
TUBING X-STREAM 5552002 10EA/CS (IRR) IMPLANT
XBANDAGE CURAD SPOT PL .88IN AD_H NTR STRL LFX (WOUND CARE SUPPLY) ×4 IMPLANT

## 2018-05-05 NOTE — Interval H&P Note (Signed)
[facility]      H&P UPDATE FORM                                                                                  Senna, Lape Marliss Czar, 38 y.o. female  Date of Admission:  05/05/2018  Date of Birth:  1980/04/11    05/05/2018    STOP: IF H&P IS GREATER THAN 30 DAYS FROM SURGICAL DAY COMPLETE NEW H&P IS REQUIRED.     H & P updated the day of the procedure.  1.  H&P completed within 30 days of surgical procedure and has been reviewed within 24 hours of admission but prior to surgery or a procedure requiring anesthesia services, the patient has been examined, and no change has occured in the patients condition since the H&P was completed.       Change in medications: No        Patient's last menstrual period was 05/05/2018.      Comments:     2.  Patient continues to be appropriate candidate for planned surgical procedure. YES    Audria Nine, MD

## 2018-05-05 NOTE — Anesthesia Preprocedure Evaluation (Signed)
ANESTHESIA PRE-OP EVALUATION  Planned Procedure: CHOLECYSTECTOMY LAPAROSCOPIC (SILS) (N/A )  Review of Systems     anesthesia history negative     patient summary reviewed  nursing notes reviewed        Pulmonary   Smoking history and Smoked in last 24 hours,   Cardiovascular  negative cardio ROS,  NYHA Classification:I  ECG reviewed No peripheral edema,  Exercise Tolerance: <4 METS        GI/Hepatic/Renal   negative GI/hepatic/renal ROS,      Endo/Other   neg endo/other ROS,        Neuro/Psych/MS    headaches, Substance abuse and marijuana     Cancer  negative hematology/oncology ROS,                    Physical Assessment      Patient summary reviewed and Nursing notes reviewed   Airway       Mallampati: II    TM distance: >3 FB    Neck ROM: full  Mouth Opening: good.  No Facial hair  No Beard  No endotracheal tube present  No Tracheostomy present    Dental       Dentition intact             Pulmonary    Breath sounds clear to auscultation  (-) no rhonchi, no decreased breath sounds, no wheezes, no rales and no stridor     Cardiovascular    Rhythm: regular  Rate: Normal  (-) no friction rub, carotid bruit is not present, no peripheral edema and no murmur     Other findings            Plan  ASA 2     Planned anesthesia type: GETA    plan to administer opioids postoperatively          Intravenous induction     Anesthesia issues/risks discussed are: Dental Injuries, PONV, Post-op Cognitive Dysfunction, Post-op Agitation/Tantrum, Cardiac Events/MI, Aspiration and Sore Throat.  Anesthetic plan and risks discussed with patient.      Use of blood products discussed with patient who.     Patient's NPO status is appropriate for Anesthesia.           Plan discussed with CRNA.

## 2018-05-05 NOTE — Discharge Instructions (Signed)
RESUME HOME DIET AS TOLERATED    RESUME HOME MEDICATIONS AS BEFORE    AVOID FOR 24 HOURS:  STAIRS, DRIVING, OPERATING HAZARDOUS MACHINERY/POWER TOOLS, SIGNING ANY LEGAL DOCUMENTS, USING COOK STOVE

## 2018-05-05 NOTE — OR Surgeon (Signed)
[place], [address]  Operation Summary            Patient Name:  Holly Fritz  Patient MRN:  [id number]  Patient DOB:  Oct 31, 1980  Date of Service:  05/05/2018    PREOPERATIVE DIAGNOSIS:    1. Acute cholecystitis and cholelithiasis  2. 10 x 7 mm skin mole sub epigastric    POSTOPERATIVE DIAGNOSES:    1. Same as above with hydrops of the gallbladder    NAME OF PROCEDURE:  1. Laparoscopic cholecystectomy  2. Excision of skin mole    OPERATIVE SUMMARY:  After obtaining informed consent the patient was taken to the operating theater placed in a supine position and general anesthesia was initiated.  A time-out was performed the abdomen was draped and prepped in a sterile manner we anesthetized with 5 cc of Marcaine on the inferior edge of the umbilicus made a small transverse incision placed a Veress needle into the abdominal cavity and instilled 2.8 L of CO2 to limiting pressure of 15 mmHg.  The Veress needle was removed and a 5 mm trocar was placed through which we placed a 5 mm 0 degree scope.  Under direct vision we made a anesthetized with 5 cc of Marcaine and created a sub epigastric incision for placement of our 12 mm trocar.  We then placed the trocar under direct vision we then anesthetized 2 sites in the right upper quadrant with 5 cc of 0.25% Marcaine and placed 5 mm trocar we noted that we had a markedly enlarged edematous gallbladder consistent with acute cholecystitis grasping the redundant apex of the gallbladder we took cephalad to the liver tenting up the fundus of the gallbladder which we were able to then grasp elevate and dissect a markedly edematous and inflamed tissue down to the cystic duct and the cystic duct artery calow's lymph node was slightly enlarged.  After dissecting out the cystic duct and artery we clamped them on the proximal side doubly clipped them singly distally to  grasp and elevate the gallbladder we had to remove some of the fluid from the markedly edematous gallbladder we placed a Veress needle through the abdominal wall and aspirated about 80 cc of clear fluid consistent with hydrops of the gallbladder.  We sent some of the fluid for culture.  With our gallbladder position we then transected the neck of the cystic duct and the cystic artery lifting up on the edematous gallbladder with the which was deeply imbedded in the liver bed we separated it from the liver bed by using Endo Shears and electrocautery after completing the separation we grasped the neck of the gallbladder pulled up through our 12 mm trocar sub epigastric trocar removing the trocar pulling the neck of the gallbladder through the skin after completing that we gently dilated up our fascial incision into the abdominal cavity were able to pull the gallbladder through and delivered for pathology.  There was a palpable 2 stones in the neck of the gallbladder.  After removal we then irrigated small amount of hydrops fluid that spilled into Morison's pouch and small amount of blood there was no active bleeding the clips in Morison's pouch appeared to be securely across the cystic duct and the cystic artery.  Next we vented and removed our trocars our larger in size incision in the sub epigastrium we closed the skin with a subcuticular stitch of 3 0 Vicryl we then sealed all 4 of  our small incisions with Dermabond.  Just above my sub epigastric incision by about an inch and a half was a 10 mm x 7 mm skin wall we anesthetized beneath that with about 3 cc of Marcaine and amputated and closed the site with Dermabond.  This also was sent for pathologic evaluation.  The patient was then transferred to the recovery room in excellent condition.    ATTENDING SURGEON:  Ples Specter. Myriam Jacobson, MD  ASSISTANT SURGEON: None  ANESTHESIA:  General anesthesia  COMPLICATIONS:  None.       Audria Nine, MD  05/05/2018, 10:28

## 2018-05-05 NOTE — OR PreOp (Signed)
PAIN MANAGEMENT AFTER SURGERY/RISKS OF SURGERY/LIFT RESTRICTIONS/WOUND CARE/DRESSING CHANGES HANDOUT REVIEWED WITH PT, DENIED HAVING QUESTIONS AND UNDERSTANDING VOICED.

## 2018-05-05 NOTE — OR Nursing (Signed)
Grounding pad site clear. Pt stable. IV patent.

## 2018-05-05 NOTE — Anesthesia Postprocedure Evaluation (Signed)
Anesthesia Post Op Evaluation    Patient: Holly Fritz  Procedure(s):  CHOLECYSTECTOMY LAPAROSCOPIC (SILS)    Last Vitals:Temperature: 36.8 C (98.2 F) (05/05/18 1029)  Heart Rate: 95 (05/05/18 1030)  BP (Non-Invasive): (!) 139/108 (05/05/18 1030)  Respiratory Rate: 19 (05/05/18 1030)  SpO2: 99 % (05/05/18 1030)    Patient is sufficiently recovered from the effects of anesthesia to participate in the evaluation and has returned to their pre-procedure level.  Patient location during evaluation: PACU       Patient participation: complete - patient participated  Level of consciousness: awake and alert and responsive to verbal stimuli    Pain score: 4  Pain management: adequate  Airway patency: patent  Anesthetic complications: no  Cardiovascular status: acceptable  Respiratory status: acceptable and room air  Hydration status: acceptable  Patient post-procedure temperature: Pt Normothermic   PONV Status: Absent

## 2018-05-07 LAB — HISTORICAL SURGICAL PATHOLOGY SPECIMEN

## 2018-05-08 ENCOUNTER — Encounter (HOSPITAL_COMMUNITY): Payer: Self-pay

## 2018-05-08 ENCOUNTER — Emergency Department (HOSPITAL_COMMUNITY): Payer: 59

## 2018-05-08 ENCOUNTER — Emergency Department
Admission: EM | Admit: 2018-05-08 | Discharge: 2018-05-08 | Disposition: A | Discharge status: Home / Self Care | Payer: 59 | Attending: Physician Assistant | Admitting: Physician Assistant

## 2018-05-08 ENCOUNTER — Other Ambulatory Visit: Payer: Self-pay

## 2018-05-08 DIAGNOSIS — Q796 Ehlers-Danlos syndrome, unspecified: Secondary | ICD-10-CM | POA: Insufficient documentation

## 2018-05-08 DIAGNOSIS — G8918 Other acute postprocedural pain: Principal | ICD-10-CM | POA: Insufficient documentation

## 2018-05-08 DIAGNOSIS — Q0700 Arnold-Chiari syndrome without spina bifida or hydrocephalus: Secondary | ICD-10-CM | POA: Insufficient documentation

## 2018-05-08 DIAGNOSIS — F17210 Nicotine dependence, cigarettes, uncomplicated: Secondary | ICD-10-CM | POA: Insufficient documentation

## 2018-05-08 DIAGNOSIS — Z79891 Long term (current) use of opiate analgesic: Secondary | ICD-10-CM | POA: Insufficient documentation

## 2018-05-08 LAB — CBC WITH DIFF
BASOPHIL #: <0.04 10*3/uL (ref <=0.20)
BASOPHIL %: 0 %
EOSINOPHIL #: 0.13 10*3/uL (ref <=0.50)
EOSINOPHIL #: 0.13 x10ˆ3/uL (ref <=0.50)
EOSINOPHIL %: 1 %
HCT: 33.3 % — ABNORMAL LOW (ref 34.8–46.0)
HGB: 11.1 g/dL — ABNORMAL LOW (ref 11.5–16.0)
IMMATURE GRANULOCYTE #: <0.04 x10ˆ3/uL (ref <0.10)
IMMATURE GRANULOCYTE %: 0 % (ref 0–1)
LYMPHOCYTE #: 1.07 x10ˆ3/uL (ref 1.00–4.80)
LYMPHOCYTE %: 11 %
MCH: 29.2 pg (ref 26.0–32.0)
MCHC: 33.3 g/dL (ref 31.0–35.5)
MCV: 87.6 fL (ref 78.0–100.0)
MONOCYTE #: 0.73 x10ˆ3/uL (ref 0.20–1.10)
MONOCYTE %: 8 %
MPV: 9.6 fL (ref 8.7–12.5)
NEUTROPHIL #: 7.71 x10ˆ3/uL — ABNORMAL HIGH (ref 1.50–7.70)
NEUTROPHIL %: 80 %
PLATELETS: 345 10*3/uL (ref 150–400)
RBC: 3.8 10*6/uL — ABNORMAL LOW (ref 3.85–5.22)
RDW-CV: 14.2 % (ref 11.5–15.5)
WBC: 9.7 x10ˆ3/uL (ref 3.7–11.0)

## 2018-05-08 LAB — COMPREHENSIVE METABOLIC PANEL, NON-FASTING
ALBUMIN: 3.4 g/dL — ABNORMAL LOW (ref 3.5–5.0)
ALKALINE PHOSPHATASE: 109 U/L (ref 38–126)
ALT (SGPT): 53 U/L — ABNORMAL HIGH (ref <=35)
ANION GAP: 5 mmol/L — ABNORMAL LOW (ref 10–20)
AST (SGOT): 44 U/L — ABNORMAL HIGH (ref 14–36)
BILIRUBIN TOTAL: 0.1 mg/dL — ABNORMAL LOW (ref 0.3–1.3)
BUN/CREA RATIO: 18
BUN: 9 mg/dL (ref 8–25)
CALCIUM: 8.5 mg/dL (ref 8.5–10.4)
CHLORIDE: 101 mmol/L (ref 98–107)
CO2 TOTAL: 27 mmol/L (ref 22–32)
CREATININE: 0.5 mg/dL (ref 0.49–1.10)
ESTIMATED GFR: 123 mL/min/1.73mˆ2
GLUCOSE: 110 mg/dL — ABNORMAL HIGH (ref 70–105)
POTASSIUM: 3.3 mmol/L — ABNORMAL LOW (ref 3.5–5.1)
PROTEIN TOTAL: 6.5 g/dL (ref 6.4–8.3)
SODIUM: 133 mmol/L — ABNORMAL LOW (ref 137–145)

## 2018-05-08 LAB — URINALYSIS, MACROSCOPIC
BILIRUBIN: NEGATIVE mg/dL
GLUCOSE: NEGATIVE mg/dL
KETONES: NEGATIVE mg/dL
LEUKOCYTES: NEGATIVE WBCs/uL
NITRITE: NEGATIVE
PH: 6.5 (ref 5.0–8.0)
PROTEIN: NEGATIVE mg/dL
SPECIFIC GRAVITY: 1.01 (ref 1.005–1.030)
UROBILINOGEN: 0.2 mg/dL (ref 0.2–1.0)

## 2018-05-08 LAB — ADULT ROUTINE BLOOD CULTURE, SET OF 2 BOTTLES (BACTERIA AND YEAST)
BLOOD CULTURE, ROUTINE: NO GROWTH
BLOOD CULTURE, ROUTINE: NO GROWTH

## 2018-05-08 LAB — LIPASE: LIPASE: <10 U/L — ABNORMAL LOW (ref 23–300)

## 2018-05-08 LAB — URINALYSIS, MICROSCOPIC

## 2018-05-08 LAB — HCG, URINE QUALITATIVE, PREGNANCY: HCG URINE QUALITATIVE: NEGATIVE

## 2018-05-08 LAB — PT/INR
INR: 1.04 (ref <=4.50)
PROTHROMBIN TIME: 12.1 s (ref 9.4–12.5)

## 2018-05-08 LAB — PTT (PARTIAL THROMBOPLASTIN TIME): APTT: 37.1 s — ABNORMAL HIGH (ref 25.1–36.5)

## 2018-05-08 LAB — PHOSPHORUS: PHOSPHORUS: 3.3 mg/dL (ref 2.4–4.7)

## 2018-05-08 LAB — MAGNESIUM: MAGNESIUM: 2.1 mg/dL (ref 1.6–2.5)

## 2018-05-08 LAB — GOLD TOP TUBE

## 2018-05-08 LAB — LACTIC ACID LEVEL W/ REFLEX FOR LEVEL >2.0: LACTIC ACID: 0.7 mmol/L (ref 0.7–2.1)

## 2018-05-08 LAB — GRAY TOP TUBE

## 2018-05-08 MED ORDER — FENTANYL (PF) 50 MCG/ML INJECTION SOLUTION
25.0000 ug | Freq: Once | INTRAMUSCULAR | Status: DC
Start: 2018-05-08 — End: 2018-05-08
  Administered 2018-05-08: 0 ug via INTRAVENOUS
  Filled 2018-05-08: qty 2

## 2018-05-08 MED ORDER — IOVERSOL 320 MG IODINE/ML INTRAVENOUS SOLUTION
75.00 mL | INTRAVENOUS | Status: AC
Start: 2018-05-08 — End: 2018-05-08
  Administered 2018-05-08: 75 mL via INTRAVENOUS

## 2018-05-08 MED ORDER — SODIUM CHLORIDE 0.9 % IV BOLUS
1000.0000 mL | INJECTION | Status: AC
Start: 2018-05-08 — End: 2018-05-08
  Administered 2018-05-08: 0 mL via INTRAVENOUS
  Administered 2018-05-08: 1000 mL via INTRAVENOUS
  Filled 2018-05-08: qty 1000

## 2018-05-08 MED ORDER — ONDANSETRON HCL (PF) 4 MG/2 ML INJECTION SOLUTION
4.0000 mg | INTRAMUSCULAR | Status: AC
Start: 2018-05-08 — End: 2018-05-08
  Administered 2018-05-08: 4 mg via INTRAVENOUS
  Filled 2018-05-08: qty 2

## 2018-05-08 MED ORDER — PANTOPRAZOLE 40 MG TABLET,DELAYED RELEASE
40.0000 mg | DELAYED_RELEASE_TABLET | Freq: Every day | ORAL | 0 refills | Status: AC
Start: 2018-05-08 — End: 2018-05-22

## 2018-05-08 MED ORDER — PANTOPRAZOLE 40 MG INTRAVENOUS SOLUTION
40.00 mg | INTRAVENOUS | Status: AC
Start: 2018-05-08 — End: 2018-05-08
  Administered 2018-05-08: 40 mg via INTRAVENOUS

## 2018-05-08 MED ORDER — SODIUM CHLORIDE 0.9 % INTRAVENOUS PIGGYBACK
INJECTION | INTRAVENOUS | Status: DC
Start: 2018-05-08 — End: 2018-05-08
  Filled 2018-05-08: qty 10

## 2018-05-08 MED ORDER — FENTANYL (PF) 50 MCG/ML INJECTION SOLUTION
25.00 ug | INTRAMUSCULAR | Status: AC
Start: 2018-05-08 — End: 2018-05-08
  Administered 2018-05-08: 25 ug via INTRAVENOUS
  Filled 2018-05-08: qty 2

## 2018-05-08 MED ADMIN — fentaNYL (PF) 50 mcg/mL injection solution: INTRAVENOUS | @ 19:00:00

## 2018-05-08 MED ADMIN — lactated Ringers intravenous solution: INTRAVENOUS | @ 18:00:00 | NDC 00338011704

## 2018-05-08 MED ADMIN — phenylephrine 2.5 % eye drops: INTRAVENOUS | @ 19:00:00

## 2018-05-08 MED ADMIN — ondansetron HCl (PF) 4 mg/2 mL injection solution: INTRAVENOUS | @ 17:00:00

## 2018-05-08 MED ADMIN — sodium chloride 0.9 % intravenous solution: INTRAVENOUS | @ 17:00:00

## 2018-05-08 NOTE — ED Nurses Note (Signed)
Discharge instructions reviewed with the patient. Written instructions provided. Prescription for Protonix given to the patient usage and precautions reviewed. Verbalized understanding. Encouraged patient to follow-up with her PCP and surgeon on Monday.

## 2018-05-08 NOTE — ED Triage Notes (Signed)
Pt had cholecystectomy completed by Dr. Myriam Jacobson here on Tuesday. Patient started experiencing fevers starting yesterday and went up to 102.2 today. Patient also states she is having abdominal swelling and pain.

## 2018-05-08 NOTE — ED Provider Notes (Signed)
[place] OF EMERGENCY MEDICINE  EMERGENCY DEPARTMENT HISTORY AND PHYSICAL      Chief Complaint:    Patient is a 38 y.o.  female presenting to the ED with chief complaint of post operative fever     History of Present Illness:    38 y/o WF pt with PMH of Ehlers-Danlos, gastroparesis, Budd Chiari malformation presents to [facility] ER via POV for c/o abd pain , fever post operatively. Patient had cholecystectomy performed on 05/05/2018 for cholecystitis at [facility]. She states that she has had pain post operatively, and this is different. 8/10. She states that the pain is more diffuse, non radiating, cramping. She has bloating. She states that pain is constant. Worse with movement, better with medications. She has nausea, and decreased appetite. She denies any vomiting, or diarrhea. She states her last BM was today, but denies any blood. She states that she has been taking dilaudid PO. She denies any urinary sx. Last meal today at noon. Last dilaudid at 1:30 pm today.     Review of Systems: See HPI    Constitutional: No fever, chills  Skin: No rashes or lesions  MSK: No joint pain.  No neck or back pain  Neuro: No numbness, tingling, or weakness.  Psych: No SI or HI. Normal mood  All other systems were reviewed and were negative except for what is mentioned in the HPI.    Past Medical History:  Past Medical History:   Diagnosis Date   . Autoimmune disorder (CMS HCC)    . Chiari I malformation (CMS HCC)    . Chronic pain    . Convergence insufficiency    . Dysautonomia (CMS [place])    . Ehlers-Danlos syndrome    . Ehlers-Danlos syndrome    . Gastroparesis    . Headache      Past Surgical History:   Procedure Laterality Date   . Esophageal dilation     . Hx myomectomy     . Hx wisdom teeth extraction     . Posterior fossa decompression         Above history reviewed with patient.  Allergies, medication list, and old records also reviewed.     Patient Active Problem List   Diagnosis   . Bacteremia due to  coagulase-negative Staphylococcus   . Chiari I malformation (CMS HCC)   . Ehlers-Danlos syndrome   . Gastroparesis       Social History:    Social History     Tobacco Use   . Smoking status: Current Every Day Smoker     Packs/day: 1.50     Years: 20.00     Pack years: 30.00   . Smokeless tobacco: Never Used   Substance Use Topics   . Alcohol use: Not on file   . Drug use: Yes     Types: Marijuana         Filed Vitals:    05/08/18 1637 05/08/18 1856   BP: 129/84 117/87   Pulse: (!) 117 90   Resp: 20 18   Temp: 37.7 C (99.8 F) 37.2 C (99 F)   SpO2: 96% 97%       Physical Exam:     Nursing note and vitals reviewed.  Vital signs reviewed as above. No acute distress.   Constitutional: Pt is well-developed and well-nourished.   Head: Normocephalic and atraumatic.   Eyes: Conjunctivae are normal. Pupils are equal, round, and reactive to light. EOM are intact  Ears: TM's  clear bilaterally  Mouth: Moist, without erythema  Neck: Soft, supple, full range of motion.  Heart: Normal rate  Pulmonary/Chest: No respiratory distress  Abdomen: Soft, active bowel sounds. ++ TTP diffuse lower abd L>R. No guarding, no rebound, no rigidity. No CVA tenderness.   Musculoskeletal: Normal range of motion. No deformities.   Neurological: No focal deficits noted.  Skin: No rash or lesions  Psychiatric: Patient has a normal mood and affect.       Labs:      Results for orders placed or performed during the hospital encounter of 05/08/18   COMPREHENSIVE METABOLIC PANEL, NON-FASTING   Result Value Ref Range    SODIUM 133 (L) 137 - 145 mmol/L    POTASSIUM 3.3 (L) 3.5 - 5.1 mmol/L    CHLORIDE 101 98 - 107 mmol/L    CO2 TOTAL 27 22 - 32 mmol/L    ANION GAP 5 (L) 10 - 20 mmol/L    BUN 9 8 - 25 mg/dL    CREATININE 0.50 0.49 - 1.10 mg/dL    BUN/CREA RATIO 18     ESTIMATED GFR 123 mL/min/1.50m^2    ALBUMIN 3.4 (L) 3.5 - 5.0 g/dL    CALCIUM 8.5 8.5 - 10.4 mg/dL    GLUCOSE 110 (H) 70 - 105 mg/dL    ALKALINE PHOSPHATASE 109 38 - 126 U/L    ALT (SGPT) 53  (H) <=35 U/L    AST (SGOT) 44 (H) 14 - 36 U/L    BILIRUBIN TOTAL 0.1 (L) 0.3 - 1.3 mg/dL    PROTEIN TOTAL 6.5 6.4 - 8.3 g/dL   LIPASE   Result Value Ref Range    LIPASE <10 (L) 23 - 300 U/L   PT/INR   Result Value Ref Range    PROTHROMBIN TIME 12.1 9.4 - 12.5 seconds    INR 1.04 <=4.50   PTT (PARTIAL THROMBOPLASTIN TIME)   Result Value Ref Range    APTT 37.1 (H) 25.1 - 36.5 seconds   HCG, URINE QUALITATIVE, PREGNANCY   Result Value Ref Range    HCG URINE QUALITATIVE Negative    CBC WITH DIFF   Result Value Ref Range    WBC 9.7 3.7 - 11.0 x10^3/uL    RBC 3.80 (L) 3.85 - 5.22 x10^6/uL    HGB 11.1 (L) 11.5 - 16.0 g/dL    HCT 33.3 (L) 34.8 - 46.0 %    MCV 87.6 78.0 - 100.0 fL    MCH 29.2 26.0 - 32.0 pg    MCHC 33.3 31.0 - 35.5 g/dL    RDW-CV 14.2 11.5 - 15.5 %    PLATELETS 345 150 - 400 x10^3/uL    MPV 9.6 8.7 - 12.5 fL    NEUTROPHIL % 80 %    LYMPHOCYTE % 11 %    MONOCYTE % 8 %    EOSINOPHIL % 1 %    BASOPHIL % 0 %    NEUTROPHIL # 7.71 (H) 1.50 - 7.70 x10^3/uL    LYMPHOCYTE # 1.07 1.00 - 4.80 x10^3/uL    MONOCYTE # 0.73 0.20 - 1.10 x10^3/uL    EOSINOPHIL # 0.13 <=0.50 x10^3/uL    BASOPHIL # <0.04 <=0.20 x10^3/uL    IMMATURE GRANULOCYTE % 0 0 - 1 %    IMMATURE GRANULOCYTE # <0.04 <0.10 x10^3/uL   URINALYSIS, MACROSCOPIC   Result Value Ref Range    COLOR Yellow     APPEARANCE Clear     SPECIFIC GRAVITY 1.010 1.005 - 1.030  PH 6.5 5.0 - 8.0    LEUKOCYTES Negative Negative WBCs/uL    NITRITE Negative Negative    PROTEIN Negative Negative mg/dL    GLUCOSE Negative Negative mg/dL    KETONES Negative Negative mg/dL    UROBILINOGEN 0.2  0.2 - 1.0 mg/dL    BILIRUBIN Negative Negative mg/dL    BLOOD Small (A) Negative mg/dL   URINALYSIS, MICROSCOPIC   Result Value Ref Range    RBCS 0-2 Occasional or less, 0-2, 1-3, None /hpf    WBCS None Occasional or less, 0-2, 1-3, None /hpf    BACTERIA None None /hpf    SQUAMOUS EPITHELIAL 3-10 /hpf   LACTIC ACID LEVEL W/ REFLEX FOR LEVEL >2.0   Result Value Ref Range    LACTIC ACID 0.7 0.7  - 2.1 mmol/L   MAGNESIUM   Result Value Ref Range    MAGNESIUM 2.1 1.6 - 2.5 mg/dL   PHOSPHORUS   Result Value Ref Range    PHOSPHORUS 3.3 2.4 - 4.7 mg/dL       Imaging:    Results for orders placed or performed during the hospital encounter of 05/08/18 (from the past 72 hour(s))   CT ABDOMEN PELVIS W IV CONTRAST     Status: None    Narrative    Holly Fritz    EXAMINATION: CT ABDOMEN PELVIS W IV CONTRAST     CLINICAL INDICATION: abd pain, s/p cholecystectomy. fever .    RADIATION DOSE: 845.80 mGycm    CONTRAST: 75 ml's of Optiray 320    TECHNIQUE: Axial plane images were performed. Coronal and Sagittal  reformatted image were obtained. IV contrast was administered. ALARA  technique    COMPARISON:  05/03/2018    FINDINGS:    Lower chest: No abnormalities noted.    Liver: Normal.  Gallbladder and biliary tree: The gallbladder has been recently removed.  There is evidence of ill-defined fluid in the gallbladder fossa which could  be postoperative in nature.  Pancreas: Normal.  Spleen: Normal.  Adrenals: Normal.  Kidneys and ureters: Normal.  Bowel:  There is edema and dilatation of the duodenal C-loop.  Appendix:Not seen  Bladder: Normal.  Peritoneum: No ascites or free air. No other fluid collection.  Vessels: Normal.  Retroperitoneum: Normal.  Abdominal Wall: Normal.  Bones: Normal.    Other:No additional findings.      Impression    Status post cholecystectomy.    Ill-defined fluid in the gallbladder fossa could be the result of recent  surgery.    Edema and dilatation of the duodenal C-loop.        Radiologist location ID: [id number]         Orders Placed This Encounter   . ADULT ROUTINE BLOOD CULTURE, SET OF 2 BOTTLES (BACTERIA AND YEAST)   . ADULT ROUTINE BLOOD CULTURE, SET OF 2 BOTTLES (BACTERIA AND YEAST)   . CT ABDOMEN PELVIS W IV CONTRAST   . CBC/DIFF   . COMPREHENSIVE METABOLIC PANEL, NON-FASTING   . LIPASE   . PT/INR   . PTT (PARTIAL THROMBOPLASTIN TIME)   . URINALYSIS, MACROSCOPIC AND MICROSCOPIC  W/CULTURE REFLEX   . HCG, URINE QUALITATIVE, PREGNANCY   . CBC WITH DIFF   . URINALYSIS, MACROSCOPIC   . URINALYSIS, MICROSCOPIC   . LACTIC ACID LEVEL W/ REFLEX FOR LEVEL >2.0   . MAGNESIUM   . PHOSPHORUS   . GRAY TOP TUBE   . GOLD TOP TUBE   . NS bolus infusion 1,000 mL   .  ondansetron (ZOFRAN) 2 mg/mL injection   . ioversol (OPTIRAY 320) infusion   . pantoprazole (PROTONIX) injection   . fentaNYL (SUBLIMAZE) 50 mcg/mL injection   . pantoprazole (PROTONIX) 40 mg Oral Tablet, Delayed Release (E.C.)           MDM:   ED Course as of May 07 1908   Fri May 08, 2018   1734 Spoke with Dr Juliann Pares regarding patient.    [XK]   4818 CRISP report shows recent RX for dilaudid.     [HU]   3149 Patient requesting pain medicine. Spoke with Dr Shawn Stall- will give fentanyl    [KW]   7026 Patient educated on all lab results. Waiting for CT to be read.    [KW]   3785 Patient refused fentanyl stating that it makes her pain worse.    [KW]   8850 Patient requesting dilaudid    [KW]   2774 Spoke with Dr Juliann Pares regarding CT scan results.    [KW]   Maili patient that MD will not be ordering dilaudid for patient. She will now take the fentanyl.    [KW]      ED Course User Index  [KW] Vinnie Level, [place]       Patient will continue with Dilaudid, as prescribed by Dr Myriam Jacobson, general surgeon. She is given RX for Protonix. She will continue with surgical guidelines following procedure. She is given return precautions. She will f/u with Dr Myriam Jacobson in 2 days.        During the patient's stay in the emergency department, the above listed imaging and/or labs were performed to assist with medical decision making and were reviewed by myself as available for review.    Patient rechecked and remained stable throughout the emergency department course.      Results discussed with patient.    All questions/concerns addressed, and patient agrees with disposition plan.    Advised that patient return to ED immediately for any new or worsening  symptoms; otherwise, follow up with PCP.    IMPRESSION:   Encounter Diagnosis   Name Primary?   . Pain associated with surgical procedure Yes           DISPOSITION/PLAN:    Discharged  Audria Nine, MD  [address]  [place] [address]  [phone]    Schedule an appointment as soon as possible for a visit in 2 days      [facility]  [address]  [place] [address]  [phone]    If symptoms worsen

## 2018-05-10 LAB — ANAEROBIC CULTURE

## 2018-05-12 LAB — STERILE SITE CULTURE AND GRAM STAIN, AEROBIC
GRAM STAIN: NONE SEEN
GRAM STAIN: NONE SEEN

## 2018-05-13 LAB — ADULT ROUTINE BLOOD CULTURE, SET OF 2 BOTTLES (BACTERIA AND YEAST)
BLOOD CULTURE, ROUTINE: NO GROWTH
BLOOD CULTURE, ROUTINE: NO GROWTH
BLOOD CULTURE, ROUTINE: NO GROWTH

## 2018-05-18 ENCOUNTER — Other Ambulatory Visit: Payer: Self-pay

## 2018-05-18 ENCOUNTER — Encounter (HOSPITAL_BASED_OUTPATIENT_CLINIC_OR_DEPARTMENT_OTHER): Payer: Self-pay | Admitting: Surgical Critical Care

## 2018-05-18 ENCOUNTER — Ambulatory Visit: Payer: 59 | Attending: Surgical Critical Care | Admitting: Surgical Critical Care

## 2018-05-18 DIAGNOSIS — G8929 Other chronic pain: Secondary | ICD-10-CM | POA: Insufficient documentation

## 2018-05-18 DIAGNOSIS — Z79899 Other long term (current) drug therapy: Secondary | ICD-10-CM | POA: Insufficient documentation

## 2018-05-18 DIAGNOSIS — Z880 Allergy status to penicillin: Secondary | ICD-10-CM | POA: Insufficient documentation

## 2018-05-18 DIAGNOSIS — Z885 Allergy status to narcotic agent status: Secondary | ICD-10-CM | POA: Insufficient documentation

## 2018-05-18 DIAGNOSIS — Z9049 Acquired absence of other specified parts of digestive tract: Secondary | ICD-10-CM | POA: Insufficient documentation

## 2018-05-18 DIAGNOSIS — Q796 Ehlers-Danlos syndrome, unspecified: Secondary | ICD-10-CM | POA: Insufficient documentation

## 2018-05-18 DIAGNOSIS — Z881 Allergy status to other antibiotic agents status: Secondary | ICD-10-CM | POA: Insufficient documentation

## 2018-05-18 DIAGNOSIS — K3184 Gastroparesis: Secondary | ICD-10-CM | POA: Insufficient documentation

## 2018-05-18 DIAGNOSIS — Z888 Allergy status to other drugs, medicaments and biological substances status: Secondary | ICD-10-CM | POA: Insufficient documentation

## 2018-05-18 DIAGNOSIS — Z48815 Encounter for surgical aftercare following surgery on the digestive system: Secondary | ICD-10-CM | POA: Insufficient documentation

## 2018-05-18 DIAGNOSIS — Z791 Long term (current) use of non-steroidal anti-inflammatories (NSAID): Secondary | ICD-10-CM | POA: Insufficient documentation

## 2018-05-18 DIAGNOSIS — F17210 Nicotine dependence, cigarettes, uncomplicated: Secondary | ICD-10-CM | POA: Insufficient documentation

## 2018-05-18 DIAGNOSIS — G935 Compression of brain: Secondary | ICD-10-CM | POA: Insufficient documentation

## 2018-05-18 NOTE — H&P (Signed)
[facility]   [address]  [place], [place] [address]  [phone]    H&P Note    Patient:  Holly Fritz  D.O.B.:  1981-01-31  MRN#  [id number]  Date of Service:  05/18/2018  Referring Provider:  No ref. provider found    PCP:  Eddie Dibbles, MD    Chief Complaint:    Chief Complaint   Patient presents with   . Post Op     Lap chole      HPI (05/04/2018):  Holly Fritz is a 38 y.o. White female with history of Ehlers-Danlos syndrome who presents for me to render an opinion on her Post Op (Lap chole ).  The patient was seen in the ED on 05/03/2018 with right upper quadrant abdominal pain.  She states that her gallbladder was intact.  She said she had some pain for the 2 days prior with some episodes of nausea and an episode of vomiting.  She declined any chest pain, chest tightness, shortness of breath, fever, or chills.  Patient did not have any swelling in her feet.  Hx of bacteremia last year.  Stated she had a gallbladder workup done in the past.  Patient had a CT scan which revealed an enlarged gallbladder with gallstones.  White count was slightly elevated to 18,000.  Today, the patient reports RUQ to R flank, abdominal pain, gas, floating stool, with episodes of N/V.  She denies fever.  The patient reports no other complaints or concerns at this time.      HPI (05/18/2018):  Holly Fritz is a 38 y.o. White female  with PMH of Ehlers-Danlos, gastroparesis, Budd Chiari malformation who presents for Post Op (Lap chole).  The patient had a lap chole for cholecystitis and excision of skin mole on 05/05/2018.  She reported to [facility] ER after surgery on 05/08/2018 via POV for c/o abd pain and fever post operatively.  She stated that she had pain post operatively different than her pain before the surgery: 8/10. She stated that the pain was more diffuse, non radiating, cramping. She had bloating. She stated that pain was constant. Worse with movement, better  with medications. She had nausea and decreased appetite. She denied any vomiting or diarrhea. She stated her last BM was that day but denied any blood. She stated that she had been taking dilaudid PO. She denied any urinary sx. Today, the patient reports that she had a fever for 2 days prior to her last ED visit however has not had one since.  She reports she had a CT scan at the ED that revealed normal results.  She also states that her nausea has significantly improved, only taking 1 Zofran per day after eating now.  She states that her abdominal pain is different than before the procedure.  She reports having to go to the bathroom ~15 after eating.  The patient reports no other complaints or concerns at this time.    Current Outpatient Prescriptions:  Current Outpatient Medications   Medication Sig   . aspirin-acetaminophen-caffeine (EXCEDRIN) 250-250-65 mg Oral Tablet Take 2 Tabs by mouth Every 6 hours as needed for Migraine   . capsaicin (ZOSTRIX) 0.025 % Cream by Apply Topically route Twice daily   . cetirizine (ZYRTEC) 10 mg Oral Tablet Take 10 mg by mouth Once a day   . Cholecalciferol, Vitamin D3, (VITAMIN D) 25 mcg (1,000 unit) Oral Capsule Take 1,000 Units by mouth  Twice daily   . diphenhydrAMINE (BENADRYL) 25 mg Oral Capsule Take 25 mg by mouth Once per day as needed   . hydrOXYzine HCL (ATARAX) 25 mg Oral Tablet Take 25 mg by mouth Every 8 hours as needed for Itching   . Ibuprofen (MOTRIN) 600 mg Oral Tablet Take 1 Tab (600 mg total) by mouth Four times a day as needed for Pain   . mirtazapine (REMERON) 30 mg Oral Tablet Take 30 mg by mouth Every night   . OLANZapine (ZYPREXA) 10 mg Oral Tablet Take 10 mg by mouth Every night   . OLANZapine (ZYPREXA) 5 mg Oral Tablet Take 5 mg by mouth Every morning   . ondansetron (ZOFRAN ODT) 4 mg Oral Tablet, Rapid Dissolve Take 1 Tab (4 mg total) by mouth Every 8 hours as needed for Nausea/Vomiting   . pantoprazole (PROTONIX) 40 mg Oral Tablet, Delayed Release  (E.C.) Take 1 Tab (40 mg total) by mouth Once a day for 14 days (Patient not taking: Reported on 05/18/2018)   . sodium chloride (SALINE NOSE NASL) by Nasal route   . tiZANidine (ZANAFLEX) 4 mg Oral Tablet Take 4 mg by mouth Three times a day as needed     Allergies:  Allergies   Allergen Reactions   . Adhesive Rash   . Doxycycline Rash     Agitation     . Gabapentin  Other Adverse Reaction (Add comment)     Patient reports is "sensitive" to medication.   . Oxycodone      Patient reports "sensitivity" to medication.   Marland Kitchen Penicillins      unknown reaction when an infant - HAS TOLERATED KEFLEX   . Tapentadol      Patient reports "sensitivity" to medication.     Past Medical History:  Past Medical History:   Diagnosis Date   . Autoimmune disorder (CMS HCC)    . Chiari I malformation (CMS HCC)    . Chronic pain    . Convergence insufficiency    . Dysautonomia (CMS [place])    . Ehlers-Danlos syndrome    . Ehlers-Danlos syndrome    . Gastroparesis    . Headache      Past Surgical History:  Past Surgical History:   Procedure Laterality Date   . Esophageal dilation     . Hx myomectomy     . Hx wisdom teeth extraction     . Posterior fossa decompression       Family History:  Family Medical History:     None        Social History:  Social History     Occupational History   . Not on file   Tobacco Use   . Smoking status: Current Every Day Smoker     Packs/day: 1.50     Years: 20.00     Pack years: 30.00   . Smokeless tobacco: Never Used   Substance and Sexual Activity   . Alcohol use: Not on file   . Drug use: Yes     Types: Marijuana   . Sexual activity: Not on file     Review of Systems:  General:  Denies fever/chills.  Denies changes in weight.  Denies malaise/fatigue.  Dermatologic:  Denies rashes, itching, drainage, masses, or chronic sores.  HEENT:  Denies recent head trauma.  Denies blurry vision, diplopia, or photophobia.  Denies ocular drainage, pain, or redness.  Denies tinnitus, hearing loss, auricular pain, or  discharge.  Denies nosebleeds or chronic  congestion.  Denies stridor/chronic sore throat.  Cardiovascular:  Denies chest pain, palpitations, orthopnea, claudication, PND, or leg swelling.    Respiratory:  Denies SOB, chronic cough, or wheezing.  Denies sputum production.  Denies hemoptysis.    Gastrointestinal:   Denies constipation.  Denies diarrhea.  Denies any change in bowel movements.  Denies blood in stool or melena.  Reports abdominal pain that is different than before the procedure.  Reports nausea but has significantly improved, only taking 1 Zofran per day after eating now. She reports having to go to the bathroom ~15 after eating.  S/p lap chole.  Genitourinary:  Denies dysuria, urgency, change in urinary habits, or hematuria.  Denies flank pain.    Musculoskeletal:  Denies neck pain.  Denies muscle pain, weakness, or joint pain.  Denies trouble moving extremities.  Denies back pain.  Denies chronic falling.  Hematologic:  Denies easy bruising.  Denies history of bleeding disorders or blood clots.  Denies chronically swollen lymph nodes.    Endocrine:  Denies polydipsia or polyuria.  Denies thyroid problems.    Neurological:  Denies headache.  Denies dizziness.  Denies room spinning sensation.  Denies changes in sensation, speech, or focal weakness.  Denies LOC.   Psychiatric:  Denies changes in mood, anxiety, or depression.  Denies hallucinations.  Denies suicidal ideas.  Denies homicidal ideas.  Denies substance abuse/history of substance abuse.  Denies insomnia.  Denies memory loss.    Objective:  LMP 05/05/2018     Physical Exam:  Constitutional:  WDWN.  NAD.    HEENT:  Normocephalic.  Atraumatic.  Anicteric.  PEERL.  EOMI.  NMT-normal.    Neck:  Trachea midline.  Supple.  No masses or thyromegaly.    Pulmonary:  Normal effort and rate observed.  Breath sounds clear.    Cardiovascular:  NSR.  No M, R, G.  No bruits.  3/6 systolic ejection murmer over R sternal border.  Abdomen:  Non-distended or  tympanitic.  Bowel sounds normal and active in all 4 quadrants.  Abdomen soft and supple.  Tenderness is absent.  Guarding and rebound are absent.  No masses or organomegaly are present.  Hernia(s) is/are absent.  Lap chole incisions are well healed, clean, dry, and intact.  Little subcutaneous hematoma under subxiphoid incision.  Extremities:  No peripheral edema.  No cyanosis or clubbing of nails.  Venous varicosities are absent.    Musculoskeletal:  Normal muscle strength and tone of all four extremities.  Pulses are present.    Skin:  Warm and dry.  No rashes or lesions are present.  Ulcerations are absent.  No jaundice.    Psychiatric:  Normal mood and affect.  Judgement and thought content normal.      Assessment:    ICD-10-CM    1. S/P laparoscopic cholecystectomy Z90.49        Plan:   We discussed the treatment plan at length including risks, benefits, and alternatives.  The patient had a laparoscopy cholecystectomy on 05/05/2018.      Chloie is to return to clinic for follow-up as needed.  Lirio was given the opportunity to ask questions, and those questions were appropriately answered.  Deaja agreed with the treatment plan and was encouraged to call with any additional questions or concerns.      I spent more than 50% of the 30-minute visit with her discussing her disease process and various treatments, as well as the risks, benefits, and alternatives.      No  orders of the defined types were placed in this encounter.    I am scribing for, and in the presence of, Dr. Myriam Jacobson for services provided on 05/18/2018.  Lyndee Leo Soucier, [place], SCRIBE 05/18/2018, 10:49  I personally performed the services described in this documentation, as scribed  in my presence, and it is both accurate  and complete.    Audria Nine, MD    *DO NOT [place]*

## 2018-05-22 LAB — MYCOBACTERIA IDENTIFICATION AND SUSCEPTIBILITIES

## 2018-08-12 MED ADMIN — scopolamine 1 mg over 3 days transdermal patch: @ 11:00:00

## 2018-09-11 ENCOUNTER — Ambulatory Visit: Payer: 59 | Attending: Psychiatric/Mental Health

## 2018-09-11 ENCOUNTER — Other Ambulatory Visit: Payer: Self-pay

## 2018-09-11 DIAGNOSIS — Z79899 Other long term (current) drug therapy: Secondary | ICD-10-CM | POA: Insufficient documentation

## 2018-09-11 DIAGNOSIS — F321 Major depressive disorder, single episode, moderate: Secondary | ICD-10-CM | POA: Insufficient documentation

## 2018-09-11 LAB — CBC WITH DIFF
BASOPHIL #: <0.04 10*3/uL (ref <=0.20)
BASOPHIL %: 0 %
EOSINOPHIL #: 0.07 10*3/uL (ref <=0.50)
EOSINOPHIL %: 1 %
HCT: 42.8 % (ref 34.8–46.0)
HGB: 14.7 g/dL (ref 11.5–16.0)
IMMATURE GRANULOCYTE #: <0.04 10*3/uL (ref <0.10)
IMMATURE GRANULOCYTE %: 0 % (ref 0–1)
LYMPHOCYTE #: 2.91 10*3/uL (ref 1.00–4.80)
LYMPHOCYTE %: 30 %
MCH: 30 pg (ref 26.0–32.0)
MCHC: 34.3 g/dL (ref 31.0–35.5)
MCV: 87.3 fL (ref 78.0–100.0)
MONOCYTE #: 0.59 10*3/uL (ref 0.20–1.10)
MONOCYTE %: 6 %
MPV: 9.7 fL (ref 8.7–12.5)
NEUTROPHIL #: 6.2 10*3/uL (ref 1.50–7.70)
NEUTROPHIL %: 63 %
PLATELETS: 425 10*3/uL — ABNORMAL HIGH (ref 150–400)
RBC: 4.9 10*6/uL (ref 3.85–5.22)
RDW-CV: 12.9 % (ref 11.5–15.5)
WBC: 9.8 10*3/uL (ref 3.7–11.0)

## 2018-09-11 LAB — LIPID PANEL
CHOL/HDL RATIO: 6.3
CHOLESTEROL: 253 mg/dL — ABNORMAL HIGH (ref <=200)
HDL CHOL: 40 mg/dL
LDL CALC: 180 mg/dL — ABNORMAL HIGH (ref 0–129)
TRIGLYCERIDES: 165 mg/dL — ABNORMAL HIGH (ref <=150)
VLDL CALC: 33 mg/dL

## 2018-09-11 LAB — COMPREHENSIVE METABOLIC PNL, FASTING
ALBUMIN: 4.8 g/dL (ref 3.5–5.0)
ALKALINE PHOSPHATASE: 78 U/L (ref 38–126)
ALT (SGPT): 13 U/L (ref <=35)
ANION GAP: 9 mmol/L — ABNORMAL LOW (ref 10–20)
AST (SGOT): 23 U/L (ref 14–36)
BILIRUBIN TOTAL: 0.4 mg/dL (ref 0.3–1.3)
BUN/CREA RATIO: 20
BUN: 14 mg/dL (ref 8–25)
CALCIUM: 9.5 mg/dL (ref 8.5–10.4)
CHLORIDE: 104 mmol/L (ref 98–107)
CO2 TOTAL: 24 mmol/L (ref 22–32)
CREATININE: 0.7 mg/dL (ref 0.49–1.10)
ESTIMATED GFR: 110 mL/min/{1.73_m2}
GLUCOSE: 90 mg/dL (ref 70–105)
POTASSIUM: 3.8 mmol/L (ref 3.5–5.1)
PROTEIN TOTAL: 7.9 g/dL (ref 6.4–8.3)
SODIUM: 137 mmol/L (ref 137–145)

## 2018-09-11 LAB — HGA1C (HEMOGLOBIN A1C WITH EST AVG GLUCOSE)
ESTIMATED AVERAGE GLUCOSE: 103 mg/dL
HEMOGLOBIN A1C: 5.2 % (ref 4.0–6.0)

## 2018-09-11 LAB — THYROID STIMULATING HORMONE (SENSITIVE TSH): TSH: 0.763 u[IU]/mL (ref 0.350–5.000)

## 2018-09-11 LAB — THYROXINE, FREE (FREE T4): THYROXINE (T4), FREE: 1.13 ng/dL (ref 0.78–2.19)

## 2018-09-11 LAB — VITAMIN D 25 TOTAL: VITAMIN D 25, TOTAL: 50.1 ng/mL

## 2018-10-01 NOTE — Progress Notes (Signed)
Subjective     HPI:      A majority of the work and / or time of this visit was conducted by means of a two-way synchronous audio-video connection, and technical limitations of providing care through this modality were explained to the patient. Patient has explicitly consented to this telehealth visit. I spent a total of 15 minutes in direct face-to-face time with the patient during this visit, and more than 50% of the time was spent in counseling and coordination of care. In addition, I spent 5 minutes in indirect care on the day of the encounter.    Symptoms started on 8/9 with cold, cough, body aches and low-grade fever.  Fever lasted only 1 day and felt better but his symptoms of cold and cough continued.  Still feels congested with constant runny nose.  Cough with clear to yellow discharge.  Feels like she might have sinus infection that is getting into the chest now.  Occasional wheeze when she coughs it goes away.  Denies any shortness of breath.  Has been doing cold and allergy medication and Robitussin for the symptoms.  They helped temporarily but not getting rid of the symptoms.  She lives alone.  Her sister came months to help her out.  Sister had similar symptoms but she is doing better now.  Grandmother is not doing well and admitted in the hospital but she is not going there to visit her.  She do not think she has COVID.  States she has blood patch 28 July but after that she did not go out of the house.    Denies any nausea or vomiting.  Past History Review:  Past History reviewed with the patient and updated- (refer to the specific EMR sections)   Allergies, Medication List, Problem List  She  reports that she has been smoking cigarettes. She has a 10.00 pack-year smoking history. She uses smokeless tobacco.    Review of Systems:  The following systems were reviewed and negative except as noted in HPI above:  HEENT, Respiratory, Cardiovascular, GI, GU and Neurologic     Objective     There were no  vitals filed for this visit.  There is no height or weight on file to calculate BMI.    Physical Exam   Constitutional: Well-developed, well-nourished, and in no distress.   HENT:   Head: Normocephalic and atraumatic.   Neurological: Alert and oriented to person, place, and time.   Psychiatric: Mood and affect normal.   Speech is even and unlabored.       Assessment & Plan   1. Sinobronchitis  Ongoing symptoms for 2 weeks not getting any better.  Denies any exposure to COVID.  Discussed about getting the testing for COVID but she want to avoid it.  Advised to self isolate herself until her symptoms are resolved.  Started her on Z-Pak and [employer].  Advised for sinus rinses.  Signs and symptoms to watch discussed and  advised to call with any concerns  Patient verbalized understanding and agreed with the plan.    - azithromycin (ZITHROMAX) 250 MG tablet; Take 2 pills in day one then 1 pill a day for next 4 days  Dispense: 6 tablet; Refill: 0  - benzonatate (TESSALON) 200 MG capsule; Take 1 capsule (200 mg per dose) by mouth 3 (three) times daily as needed for Cough.  Dispense: 30 capsule; Refill: 0      This visit was conducted via telehealth in the setting  of a governmental "isolate at home advisory" and restrictions on travel.  I explained to patient that the risks and benefits of in-person physical assessment for the above noted symptoms, and in the absence of any red flags, favor a telemedicine visit versus in office face to face visit which would potentially pose a risk for exposure to COVID-19.    I also explained to patient that my medical assessment and recommendations are based solely on this telemedicine visit and thus limited accordingly.  I gave clear instructions for the patient to monitor carefully for any concerning signs or symptoms and follow up immediately with the office should any such concerns emerge.

## 2018-12-06 NOTE — Progress Notes (Signed)
Reason for Visit:  Reason for Visit            Anxiety Follow-up          Subjective   HPI    1. Chronic nonintractable headache, unspecified headache type  Seeing the neruology at [facility].Seeing  neurologist who is specialized in vestibular medicine.   She tried blood patch on July 28.  States she was not prepared for the planned procedure properly.  No one explained to her about the procedure well. Could not complete the procedure due to pain.  It never helped her because she did not do the procedure completely.  Saw the neurologist again and he felt her migraine can be coming from neck pain.  He wanted to do the ablation but before the ablation she needed to do to nerve blocks.  Had one nerve block on 11/30/2018.  Next day woke up with more pain.  Not planning on doing the second one because she did not see any good results.  She feels she has all the symptoms of CSF leak with the period of time..  Reports daily HA.HA make up 75% of all her pain. Most days she spends in room laying down. 4/7 days on the bed with headache. Taking more excedrin.   Naratriptan burst did not help. Emgality x 3 months did not help.   Last medication she tired is ubrevly.  States it did not help.  Always has headache but gets these severe headaches on and off which can last 1 week to 10 days sometimes.  Eye are always sensitive.Ophthalmology do not have any more suggestions for her.  States she is at loss now.  Do not know what she can do for her headaches.  States feels tired.  Legs are weak with tingling and numbness.  No energy to do things.  Feels it is all related to her chronic headaches, depression and all the other medical problems.  Heard her neurologist is leaving end of this year.  Want to see him before he leaves.    2. Severe episode of recurrent major depressive disorder, without psychotic features  Under the care of psychiatry.   Anxiety is worse.  Mom got diangossed with meningioma last year.  Had the surgery  and never recovered from it.  Passed away in 09/02/2023 of this year.  Having very hard time dealing with that.  Very tearful today.  Saw the psychiatry in 9/28.  Seeing them every 2 to 3 months.  Taking zyprexa  5 mg twice a day.   Husband is losing job abd losing insurance next month.   Worried how she is going to manage to see all the doctors without the insurance.  Not qualified for Medicare.    3. Autoimmune enteropathy  4. Dysphagia, unspecified type  5. Autoimmune autonomic neuropathy  GI dysmotility, Jackhammer esophagus with dysphagia. Did TPN in the past.She was in research study under the care of neuro gastroenterology. GI diagnosed her withautoimmune enteropathy, dysautonomiaand started on IV IG.Did them at the most once a month since 2016-09-01 to November of 2018. Noticed some improvementin BP, HR and energy level.  Could not continue the study.  Still has swallowing issues. Has vomiting daily but it comes randomly.  It is not food related.  Feels stomach is swollen. Feels she need to see the GI.  Want to get all the appointments before she loses her insurance.    Review of Systems   Constitutional: Negative for fever.  HENT: Negative for congestion.    Respiratory: Negative for cough, shortness of breath and wheezing.    Cardiovascular: Negative for chest pain and palpitations.   Gastrointestinal: Negative for constipation.   Neurological: Negative for light-headedness.   Psychiatric/Behavioral: Negative for suicidal ideas.     See HPI.    Objective   BP 106/69   Pulse 86   Temp 36.7 C (98 F) (Temporal)   Resp 16   Ht 1.626 m (5\' 4" )   Wt 65.3 kg (144 lb)   LMP 11/19/2018   BMI 24.72 kg/m     Physical Exam  Constitutional:       Appearance: She is well-developed.   HENT:      Head: Normocephalic and atraumatic.      Right Ear: External ear normal.      Left Ear: External ear normal.   Neck:      Musculoskeletal: Normal range of motion and neck supple.   Cardiovascular:      Rate and  Rhythm: Normal rate and regular rhythm.   Pulmonary:      Effort: Pulmonary effort is normal.      Breath sounds: Normal breath sounds.   Abdominal:      General: Bowel sounds are normal.      Palpations: Abdomen is soft.      Tenderness: There is no abdominal tenderness.   Lymphadenopathy:      Cervical: No cervical adenopathy.   Neurological:      Mental Status: She is alert and oriented to person, place, and time.       ASSESSMENT & PLAN:   1. Chronic nonintractable headache, unspecified headache type  Chronic headache.  Failed multiple medications.  Under the care of neurology.  Tried blood patch in July without any improvement in her symptoms.  Neurologist felt her headaches can be from neck pain.  He wanted to do the ablation but before that she had to get nerve block.  Had nerve block on 11/30/2018 and feels her pain got worse after that.  Do not want to go for the second 1.  Planning to follow-up with neurology and discuss with them about her other options.    2. Severe episode of recurrent major depressive disorder, without psychotic features  Under the care of psychiatry.  Going through a lot of anxiety.  Mom passed away in 2023-08-31 from brain tumor.  Husband is losing job next month and worried that she is going to lose her insurance.  Saw the psychiatry recently and he adjusted her medications.  Taking Remeron and Zyprexa now.    3. Autoimmune enteropathy  4. Dysphagia, unspecified type  5. Autoimmune autonomic neuropathy  Having trouble with swallowing and have issues with vomiting daily.  Suggested to make appointment with GI.  Patient verbalized understanding and agreed with the plan.    PS: Declined for Tdap and pneumococcal vaccine today.

## 2019-06-04 NOTE — Telephone Encounter (Signed)
Agree with RN's assessment and advise.

## 2019-12-08 NOTE — Progress Notes (Signed)
Reason for Visit:    Subjective   HPI     Husband lost his job and did not have insurance for 1 year.  Did not see any doctors except her psychiatrist for 1 year.      1. Dysphagia, unspecified type  2. Autoimmune autonomic neuropathy  3. Autoimmune enteropathy  GI dysmotility, Jackhammer esophagus with dysphagia. Did TPN in the past.She was in research study under the care of neuro gastroenterology. GI diagnosed her withautoimmune enteropathy, dysautonomiaand started on IV IG.Did them at the most once a month since June2018 to November of 2018. Noticed some improvementin BP, HR and energy level.  Could not continue the study.  Still has swallowing issues.  Staying nauseous all the time.  Throwing multiple times a week again.  Taking Zofran 1-2 times a day.  It is not food related.  Feels stomach is swollen.  Her weight improved 145 lbs and then down to 125 lbs.  129 lbs. Lost 20 pounds in the last 6 months.  Feels like symptoms are the same as before she started on TPN.  Did not feel like eating in the day.  Wait till evening to eat.  Eats applesauce with her medications in the morning.  Did not see GI since she left the program.  Feels like she is falling apart again.  Need referral to see GI again.  Also want refill on Zofran today.       4. Chronic nonintractable headache, unspecified headache type  She was seeing the [facility]ology[facility].Seeing neurologist who is specialized in vestibular medicine.   She tried blood patch in the past. States she was not prepared for the planned procedure properly.  No one explained to her about the procedure well. Could not complete the procedure due to pain.  It never helped her because she did not do the procedure completely.  Saw the neurologist again and he felt her migraine can be coming from neck pain.  He wanted to do the ablation but before the ablation she needed to do to nerve blocks.  Had one nerve block on 11/30/2018.  Next day woke up with more pain.   Decided not to do the second one because she did not see any good results.  She feels she has all the symptoms of CSF leak.   Naratriptan burst did not help. Emgality x 3 months did not help.  Last medication she tired is ubrevly.  States it did not help.  Always has headache but gets these severe headaches on and off which can last 1 week to 10 days sometimes.  Eye are always sensitive.Ophthalmology do not have any more suggestions for her.  States she is at loss now. Do not know what she can do for her headaches.  States feels tired.  Legs are weak with tingling and numbness.  No energy to do things.  Feels it is all related to her chronic headaches, depression and all the other medical problems.      They will start 2 hours after waking up.  Cannot tolerate the pain so taking the Excedrin daily.  Her neurologist at [facility] left in December.  Did not follow-up with anyone due to lack of insurance.  He referred her to [facility] interventional radiology for suspected CSF leaks. She sent all her paperwork and MRI results to them.  Lost insurance in between so could not follow-up.  She called them again after she got her insurance last month.  They want current  MRI to see her.  Her last MRI was 1 year ago.  Want to know if I can order another MRI if she need 1 before she sees them.  Having lot of neck, shoulder and back pain.  Pain in the back of the head and neck makes the headache worse.  Her previous neurologist gave her Zanaflex but it is not helping.  Tried baclofen in the past that did not help.  Flexeril helped some.  Want to know if she can get prescription for Flexeril.  She also tried Fioricet in the past which helps a little bit.  Need a refill on Fioricet as well.    She has insurance but need to rely on husband to treat her 2 places.  He works in [place] and need to take off to take her to the appointments.     5. Severe episode of recurrent major depressive disorder, without psychotic  features  Under the care of psychiatry.  Anxiety is worse.  Mom  Passed away in August 06, 2018.  Good days and bad days. Seeing psychiatry every 2 to 3 months.  Taking zyprexa  7.5 mg daily.    She was seen here in [place] for tachycardia.  States when she lay down her heart rate goes down.  Psychiatry started her on propranolol and it was dropping her blood pressure too low and helping with the heart rate but caused terrible headache.  Her dysautonomia is flaring up.  Trying to drink more water and take electrolytes.  Wearing compression socks and manage symptoms as much as she can.  Still feels dizzy when she is upright.  Want to know if she can see cardiologist.      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  In the last 6 months breaking out at other places as well.  Noticing it on her hands, legs, feet and few other places.  Steroid cream is not helping.  Knows it is autoimmune.  Sometimes they itch and burn.  Read online sometimes oral steroid can help.  Want to see dermatology.    Review of Systems   Constitutional: Positive for fatigue. Negative for fever.   HENT: Negative for congestion and rhinorrhea.    Respiratory: Negative for cough and shortness of breath.    Cardiovascular: Negative for chest pain and palpitations.   Psychiatric/Behavioral: Negative for suicidal ideas.     See HPI.    Objective   BP 112/75   Pulse 70   Resp 16   Ht 1.626 m (5' 4.02")   Wt 58.5 kg (129 lb)   LMP 11/23/2019   BMI 22.13 kg/m     Physical Exam  Constitutional:       Appearance: She is well-developed.   HENT:      Head: Normocephalic and atraumatic.      Right Ear: External ear normal.      Left Ear: External ear normal.      Mouth/Throat:      Mouth: Mucous membranes are moist.   Cardiovascular:      Rate and Rhythm: Normal rate and regular rhythm.   Pulmonary:      Effort: Pulmonary effort is normal.      Breath sounds: Normal breath sounds.   Abdominal:      General: Bowel sounds are  normal.      Palpations: Abdomen is soft.      Tenderness: There is abdominal tenderness (Suprapubic region.).  Musculoskeletal:      Cervical back: Normal range of motion and neck supple.   Lymphadenopathy:      Cervical: No cervical adenopathy.   Neurological:      Mental Status: She is alert and oriented to person, place, and time.   Psychiatric:         Thought Content: Thought content normal.      Comments: Very tearful and depressed today.      SKIN: Noticed raised papular lesions on the right elbow, few lesions on both hands.    ASSESSMENT & PLAN:   1. Dysphagia, unspecified type  2. Autoimmune autonomic neuropathy  3. Autoimmune enteropathy  Having trouble with swallowing and have issues with vomiting daily.  Did the prescription for Zofran.  Suggested to make appointment with GI.  Did the referral.   Patient verbalized understanding and agreed with the plan.  - Ambulatory referral to Gastroenterology    4. Chronic nonintractable headache, unspecified headache type  Chronic headache.  Failed multiple medications.  Under the care of neurology.  Tried blood patch  without any improvement in her symptoms.  Neurologist felt her headaches can be from neck pain.  He wanted to do the ablation but before that she had to get nerve block.  Had nerve block on 11/30/2018 and feels her pain got worse after that.  Did not go for the second one.  Her neurologist suggested to go to [facility] interventional radiology for suspected CSF leak.  She sent all her records to that place but in the interim lost her insurance.  Got her insurance back last month call them back and trying to get the records back.  She did her MRI year ago.  If they need her to do another MRI before they see her she will call me back to get the order for MRI.  Did the referral for neurology.  - Ambulatory referral to Neurology    5. Severe episode of recurrent major depressive disorder, without psychotic features  Under the care of psychiatry.   Going through a lot of stress and anxiety due to her medical problems.  Suggested to continue current medications.    6. Lichen of skin  With the referral for dermatology.  - Ambulatory referral to Dermatology    7. Dysautonomia  Heart rate and blood pressure has been fluctuating due to dysautonomia.    Did the referral for cardiology.  - Ambulatory referral to Cardiology    PS: Declined for Tdap and pneumococcal vaccine today.  Not planning on getting Covid vaccine.

## 2020-07-26 NOTE — Progress Notes (Signed)
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  She did well with psychiatry on remiron and zyprexa (still on), IgG, was able to swallow, gained weight to 145.Marland Kitchen  She has lost insurance for past 2-3 years and her sx have recurred . IgG stopped around 3 years ago .   1) headache never went away and was referred to [facility] to consider cns leak but never could afford to go. 2) swallowing again problematic; intiiates swallows without difficulty but food sticks mid esopagus. Abd bloats and she vomits 50% of time she eats with the food she just ate; sometimes with acid taste.  No old food vomited.    Wt down 20 lb and she is very worried about wt loss continuing.  Wt as low as 115 and now 125.    The GI sx are are major issue along with weight loss and history of getting down to 85 lb withsame sx. Has not have COVID-19 but not vaccinated.  Exam: lung clear, cor normal . abd soft non tender; no succussion splash.  No peripheral edema. ? Lichen planus over elbows, neck, knees.  IMP Wt loss due to dysphagia and vomiting.  Consider malnutrition.   Needs evaluation of etiology with cine, sold and liquid gastric empting study.  Lack of insurance issue and need to find out how to provide her care.  Cbc, comprehensive, prealbumin.  Cine esopaogram. Solid and liquid empting study.

## 2021-04-25 ENCOUNTER — Telehealth (HOSPITAL_BASED_OUTPATIENT_CLINIC_OR_DEPARTMENT_OTHER): Payer: Self-pay

## 2021-04-25 NOTE — Nursing Note (Signed)
Reason for referral: EDS    Referred by office of: Mali Hott    Records including: referral, demo, insurance, meds, notes,      Referral imported into media.    Spoke with patient. Appt scheduled for 09/24/2021 with Dr. Annamaria Boots. Patient verbalized understanding.    Heather Muth  04/25/2021, 14:20

## 2021-05-07 ENCOUNTER — Other Ambulatory Visit: Payer: Self-pay

## 2021-06-08 ENCOUNTER — Encounter (INDEPENDENT_AMBULATORY_CARE_PROVIDER_SITE_OTHER): Payer: Self-pay

## 2021-09-17 ENCOUNTER — Encounter (HOSPITAL_COMMUNITY): Payer: Self-pay

## 2021-09-24 ENCOUNTER — Ambulatory Visit (HOSPITAL_BASED_OUTPATIENT_CLINIC_OR_DEPARTMENT_OTHER): Payer: Self-pay | Admitting: INTERNAL MEDICINE

## 2021-11-02 ENCOUNTER — Encounter (INDEPENDENT_AMBULATORY_CARE_PROVIDER_SITE_OTHER): Payer: Self-pay

## 2021-11-02 ENCOUNTER — Encounter (INDEPENDENT_AMBULATORY_CARE_PROVIDER_SITE_OTHER): Payer: Self-pay | Admitting: LICENSED CLINICAL SOCIAL WORKER

## 2021-11-05 ENCOUNTER — Ambulatory Visit (INDEPENDENT_AMBULATORY_CARE_PROVIDER_SITE_OTHER): Payer: Self-pay

## 2021-11-05 NOTE — Telephone Encounter (Signed)
-----   Message from [place] Nurse sent at 11/02/2021  1:14 PM EDT -----    "This is __________ from [facility]. We received a referral for you to be scheduled with therapy services with [place]. We have some availability now, if you are interested in scheduling an appointment."       Document Contacts       Left Message to call back and sent a [place]aine as well.        Would you prefer an in-person session or a video call?       In-person- Is the patient local?/Are they aware of where we are located?       Video- Is the patient located in [place] or [place]? (If not, they will need to come to the office, or seek therapy in their area.)       Do you have access to reliable internet and a phone/computer with a video camera?  Do you have a private space to have a session? (If no, patient will need to be scheduled in-person.)       Do you use MyChart? (If no, help them get access and give instructions to call the MyChart help desk if needed.)       You will use MyChart to connect to the video session. You will want to start the check-in process a few minutes early to complete questionnaires to confirm your health history.        Do you have questions, or is there anything else I can help with today?          If Answer is No       No- Document in a chart phone note and on the waitlist that they deferred services, and briefly why (I.e.- no longer needing therapy, have therapy elsewhere, etc.)

## 2021-11-12 ENCOUNTER — Other Ambulatory Visit (HOSPITAL_COMMUNITY): Payer: Self-pay | Admitting: Obstetrics & Gynecology

## 2021-11-12 DIAGNOSIS — D259 Leiomyoma of uterus, unspecified: Secondary | ICD-10-CM

## 2021-11-12 DIAGNOSIS — R1032 Left lower quadrant pain: Secondary | ICD-10-CM

## 2021-11-12 DIAGNOSIS — Z1231 Encounter for screening mammogram for malignant neoplasm of breast: Secondary | ICD-10-CM

## 2021-11-14 ENCOUNTER — Inpatient Hospital Stay
Admission: RE | Admit: 2021-11-14 | Discharge: 2021-11-14 | Disposition: A | Discharge status: Home / Self Care | Payer: 59 | Source: Ambulatory Visit | Attending: Obstetrics & Gynecology | Admitting: Obstetrics & Gynecology

## 2021-11-14 ENCOUNTER — Other Ambulatory Visit: Payer: Self-pay

## 2021-11-14 ENCOUNTER — Encounter (HOSPITAL_COMMUNITY): Payer: Self-pay

## 2021-11-14 DIAGNOSIS — Z1231 Encounter for screening mammogram for malignant neoplasm of breast: Secondary | ICD-10-CM | POA: Insufficient documentation

## 2021-11-20 ENCOUNTER — Other Ambulatory Visit (HOSPITAL_COMMUNITY): Payer: Self-pay | Admitting: Obstetrics & Gynecology

## 2021-11-20 DIAGNOSIS — R921 Mammographic calcification found on diagnostic imaging of breast: Secondary | ICD-10-CM

## 2021-11-23 ENCOUNTER — Other Ambulatory Visit: Payer: Self-pay

## 2021-11-23 ENCOUNTER — Encounter (HOSPITAL_COMMUNITY): Payer: Self-pay

## 2021-11-23 ENCOUNTER — Inpatient Hospital Stay
Admission: RE | Admit: 2021-11-23 | Discharge: 2021-11-23 | Disposition: A | Discharge status: Home / Self Care | Payer: 59 | Source: Ambulatory Visit | Attending: Obstetrics & Gynecology | Admitting: Obstetrics & Gynecology

## 2021-11-23 ENCOUNTER — Ambulatory Visit (HOSPITAL_BASED_OUTPATIENT_CLINIC_OR_DEPARTMENT_OTHER): Payer: Self-pay | Admitting: Internal Medicine

## 2021-11-23 DIAGNOSIS — R921 Mammographic calcification found on diagnostic imaging of breast: Secondary | ICD-10-CM

## 2021-12-04 ENCOUNTER — Ambulatory Visit (HOSPITAL_COMMUNITY)
Admission: RE | Admit: 2021-12-04 | Discharge: 2021-12-04 | Disposition: A | Discharge status: Home / Self Care | Payer: Self-pay | Source: Ambulatory Visit

## 2021-12-06 ENCOUNTER — Other Ambulatory Visit: Payer: Self-pay

## 2021-12-06 ENCOUNTER — Inpatient Hospital Stay
Admission: RE | Admit: 2021-12-06 | Discharge: 2021-12-06 | Disposition: A | Discharge status: Home / Self Care | Payer: 59 | Source: Ambulatory Visit | Attending: Obstetrics & Gynecology | Admitting: Obstetrics & Gynecology

## 2021-12-06 DIAGNOSIS — R1032 Left lower quadrant pain: Secondary | ICD-10-CM

## 2021-12-06 DIAGNOSIS — D259 Leiomyoma of uterus, unspecified: Secondary | ICD-10-CM | POA: Insufficient documentation

## 2021-12-07 ENCOUNTER — Other Ambulatory Visit: Payer: 59 | Attending: Obstetrics & Gynecology

## 2021-12-07 DIAGNOSIS — F39 Unspecified mood [affective] disorder: Secondary | ICD-10-CM | POA: Insufficient documentation

## 2021-12-07 DIAGNOSIS — R1032 Left lower quadrant pain: Secondary | ICD-10-CM

## 2021-12-07 LAB — CBC WITH DIFF
BASOPHIL #: <0.04 10*3/uL (ref <=0.20)
BASOPHIL %: 0 %
EOSINOPHIL #: <0.04 10*3/uL (ref <=0.50)
EOSINOPHIL %: 0 %
HCT: 39.3 % (ref 34.8–46.0)
HGB: 13 g/dL (ref 11.5–16.0)
IMMATURE GRANULOCYTE #: <0.04 10*3/uL (ref <0.10)
IMMATURE GRANULOCYTE %: 0 % (ref 0–1)
LYMPHOCYTE #: 1.59 10*3/uL (ref 1.00–4.80)
LYMPHOCYTE %: 19 %
MCH: 30.1 pg (ref 26.0–32.0)
MCHC: 33.1 g/dL (ref 31.0–35.5)
MCV: 91 fL (ref 78.0–100.0)
MONOCYTE #: 0.45 10*3/uL (ref 0.20–1.10)
MONOCYTE %: 5 %
MPV: 9.1 fL (ref 8.7–12.5)
NEUTROPHIL #: 6.41 10*3/uL (ref 1.50–7.70)
NEUTROPHIL %: 76 %
PLATELETS: 359 10*3/uL (ref 150–400)
RBC: 4.32 10*6/uL (ref 3.85–5.22)
RDW-CV: 13 % (ref 11.5–15.5)
WBC: 8.5 10*3/uL (ref 3.7–11.0)

## 2021-12-07 LAB — THYROXINE, FREE (FREE T4): THYROXINE (T4), FREE: 0.9 ng/dL (ref 0.70–1.48)

## 2021-12-07 LAB — LIPID PANEL
CHOL/HDL RATIO: 3.9
CHOLESTEROL: 181 mg/dL (ref 100–200)
HDL CHOL: 47 mg/dL — ABNORMAL LOW (ref >=50)
LDL CALC: 121 mg/dL — ABNORMAL HIGH (ref <100)
NON-HDL: 134 mg/dL (ref <=190)
TRIGLYCERIDES: 71 mg/dL (ref <150)
VLDL CALC: 12 mg/dL (ref <30)

## 2021-12-07 LAB — THYROID STIMULATING HORMONE (SENSITIVE TSH): TSH: 0.396 u[IU]/mL (ref 0.350–4.940)

## 2022-03-02 ENCOUNTER — Ambulatory Visit: Payer: MEDICAID | Attending: Nurse Practitioner | Admitting: Nurse Practitioner

## 2022-03-02 ENCOUNTER — Other Ambulatory Visit (INDEPENDENT_AMBULATORY_CARE_PROVIDER_SITE_OTHER): Payer: MEDICAID

## 2022-03-02 ENCOUNTER — Encounter (INDEPENDENT_AMBULATORY_CARE_PROVIDER_SITE_OTHER): Payer: Self-pay | Admitting: Nurse Practitioner

## 2022-03-02 ENCOUNTER — Other Ambulatory Visit: Payer: Self-pay

## 2022-03-02 VITALS — BP 103/75 | HR 91 | Temp 98.4°F | Resp 16 | Ht 64.0 in | Wt 114.0 lb

## 2022-03-02 DIAGNOSIS — M79672 Pain in left foot: Secondary | ICD-10-CM

## 2022-03-02 NOTE — Progress Notes (Signed)
[facility]  [place]  [place] [id number]  Operated by [facility]     Name: Holly Fritz MRN:  [id number]   Date of Birth: 1980-04-08 Age: 42 y.o.   Date: 03/02/2022  Time: 17:20     Provider: Dolores Patty, FNP-C  PCP: Mali Taylor Hott, NP    Reason for visit:   Chief Complaint              Injury Pt injured left foot and ankle on a step this am            Vitals:   Vitals:    03/02/22 1633   BP: 103/75   Pulse: 91   Resp: 16   Temp: 36.9 C (98.4 F)   SpO2: 100%   Weight: 51.7 kg (113 lb 15.3 oz)   Height: 1.626 m ('5\' 4"'$ )   BMI: 19.6        History of Present Illness:  Holly Fritz is a 42 y.o. female presenting with left foot and ankle injury.  Patient reports she hit her foot on a step this morning around 10:00 a.m.Marland Kitchen  She reports pain to the lateral aspect of the left foot.  She rates her pain as 7/10. 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 She states she did take Tylenol for her pain with some improvement.  Patient ambulating with a cane due to her pain.    Past Medical History:  Past Medical History:   Diagnosis Date    Autoimmune disorder (CMS HCC)     Chiari I malformation (CMS HCC)     Chronic pain     Convergence insufficiency     Dysautonomia (CMS HCC)     Ehlers-Danlos syndrome     Ehlers-Danlos syndrome     Gastroparesis     Headache          Past Surgical History:  Past Surgical History:   Procedure Laterality Date    ESOPHAGEAL DILATION      HX MYOMECTOMY      HX WISDOM TEETH EXTRACTION      POSTERIOR FOSSA DECOMPRESSION           Allergies:  Allergies   Allergen Reactions    Adhesive Rash    Doxycycline Rash     Agitation      Propranolol Hives/ Urticaria, Itching and Rash    Ciprofloxacin     Gabapentin  Other Adverse Reaction (Add comment)     Patient reports is "sensitive" to medication.    Levaquin [Levofloxacin]     Oxycodone      Patient reports "sensitivity" to medication.    Penicillins      unknown reaction when an infant - HAS  TOLERATED KEFLEX    Tapentadol      Patient reports "sensitivity" to medication.     Medications:  Current Outpatient Medications   Medication Sig    aspirin-acetaminophen-caffeine (EXCEDRIN) 250-250-65 mg Oral Tablet Take 2 Tablets by mouth Every 6 hours as needed for Migraine    capsaicin (ZOSTRIX) 0.025 % Cream by Apply Topically route Twice daily    cetirizine (ZYRTEC) 10 mg Oral Tablet Take 1 Tablet (10 mg total) by mouth Once a day    Cholecalciferol, Vitamin D3, (VITAMIN D) 25 mcg (1,000 unit) Oral Capsule Take 1,000 Units by mouth Twice daily (Patient not taking: Reported on 03/02/2022)    clonazePAM (KLONOPIN) 0.5 mg Oral Tablet TAKE 1 TABLET BY MOUTH DAILY AS NEEDED  FOR SEVERE ANXIETY. MAX 30 TABS IN 30 DAYS    cyclobenzaprine (FLEXERIL) 10 mg Oral Tablet TAKE 1 TABLET 3 TIMES A DAY BY ORAL ROUTE.    diphenhydrAMINE (BENADRYL) 25 mg Oral Capsule Take 1 Capsule (25 mg total) by mouth Once per day as needed    hydrOXYzine HCL (ATARAX) 25 mg Oral Tablet Take 1 Tablet (25 mg total) by mouth Every 8 hours as needed for Itching    Ibuprofen (MOTRIN) 600 mg Oral Tablet Take 1 Tab (600 mg total) by mouth Four times a day as needed for Pain    lamoTRIgine (LAMICTAL) 100 mg Oral Tablet Take 1 Tablet (100 mg total) by mouth Twice daily    mirtazapine (REMERON) 30 mg Oral Tablet Take 30 mg by mouth Every night (Patient not taking: Reported on 03/02/2022)    OLANZapine (ZYPREXA) 10 mg Oral Tablet Take 10 mg by mouth Every night (Patient not taking: Reported on 03/02/2022)    OLANZapine (ZYPREXA) 5 mg Oral Tablet Take 5 mg by mouth Every morning (Patient not taking: Reported on 03/02/2022)    ondansetron (ZOFRAN ODT) 4 mg Oral Tablet, Rapid Dissolve Take 1 Tab (4 mg total) by mouth Every 8 hours as needed for Nausea/Vomiting    QUEtiapine (SEROQUEL) 100 mg Oral Tablet Take 1 Tablet (100 mg total) by mouth Every night    sodium chloride (SALINE NOSE NASL) by Nasal route    tiZANidine (ZANAFLEX) 4 mg Oral Tablet Take 4 mg by  mouth Three times a day as needed (Patient not taking: Reported on 03/02/2022)     Family History:  Family Medical History:       Problem Relation (Age of Onset)    Lymphoma Mother    No Known Problems Father, Sister, Brother, Maternal Grandmother, Maternal Grandfather, Paternal Grandmother, Paternal Grandfather, Daughter, Son, Maternal Aunt, Maternal Uncle, Paternal Aunt, Paternal Uncle, Other            Social History:  Social History     Socioeconomic History    Marital status: Married   Tobacco Use    Smoking status: Every Day     Packs/day: 1.50     Years: 20.00     Additional pack years: 0.00     Total pack years: 30.00     Types: Cigarettes    Smokeless tobacco: Never   Vaping Use    Vaping Use: Never used   Substance and Sexual Activity    Alcohol use: Not Currently    Drug use: Yes     Types: Marijuana       Review of Systems:  All pertinent Review of System as address in the HPI.    Physical Exam:  General Appearance: Alert and oriented; No acute distress; Cooperative; Appears stated age  Head: Normocephalic; Atraumatic  Eyes: PERRL; EOMI; Conjunctivae clear  Nose: Nares patent; No drainage appreciated  Neck: Supple; Full ROM; No lymphadenopathy; No tenderness to palpation of spinous processes  Lungs: Clear to auscultation in all lung fields; No rhonchi; No wheezes    Heart: Regular rate and rhythm; No murmurs appreciated  Extremities: Radial and dorsalis pedis pulses (+); No edema, no erythema.  No obvious surface trauma.  Neurovascular status intact.  Decreased active range of motion.   Musculoskeletal: No deformities; No swelling; No tenderness  Neurological: Alert and oriented     There are no exam notes on file for this visit.     Assessment:    ICD-10-CM    1. Left  foot pain  M79.672 XR FOOT LEFT     XR ANKLE LEFT           Plan:  Orders Placed This Encounter    XR FOOT LEFT    XR ANKLE LEFT     X-ray left ankle is negative.  Xray left foot negative for acute fracture.  ACE wrap and hard sole shoe  applied for comfort.  Discussed rice therapy.  Tylenol and ibuprofen as needed for pain.  Rest the next few days.  Recommend follow up with family physician within 1 week if no improvement.  Patient ambulated out of clinic in stable condition.  No follow-ups on file.    Mckinley Olheiser, FNP-C     Portions of this note may be dictated using voice recognition software or a dictation service. Variances in spelling and vocabulary are possible and unintentional. Not all errors are caught/corrected. Please notify the [facility] if any discrepancies are noted or if the meaning of any statement is not clear.

## 2022-03-02 NOTE — Progress Notes (Signed)
Applied ace bandage to left ankle/foot with post op shoe per provider. Patient tolerated well.

## 2022-03-13 ENCOUNTER — Other Ambulatory Visit (HOSPITAL_BASED_OUTPATIENT_CLINIC_OR_DEPARTMENT_OTHER): Payer: Self-pay | Admitting: Student in an Organized Health Care Education/Training Program

## 2022-03-13 ENCOUNTER — Other Ambulatory Visit (HOSPITAL_COMMUNITY): Payer: Self-pay | Admitting: NURSE PRACTITIONER

## 2022-03-13 DIAGNOSIS — R42 Dizziness and giddiness: Secondary | ICD-10-CM

## 2022-03-13 DIAGNOSIS — M79672 Pain in left foot: Secondary | ICD-10-CM

## 2022-03-13 DIAGNOSIS — G90A Postural orthostatic tachycardia syndrome (POTS): Secondary | ICD-10-CM

## 2022-03-19 ENCOUNTER — Other Ambulatory Visit: Payer: Self-pay

## 2022-03-19 ENCOUNTER — Ambulatory Visit: Payer: MEDICAID | Attending: Internal Medicine | Admitting: Internal Medicine

## 2022-03-19 VITALS — BP 118/68 | HR 100 | Ht 63.86 in | Wt 109.1 lb

## 2022-03-19 DIAGNOSIS — M2550 Pain in unspecified joint: Secondary | ICD-10-CM

## 2022-03-19 DIAGNOSIS — Q796 Ehlers-Danlos syndrome, unspecified: Secondary | ICD-10-CM

## 2022-03-19 DIAGNOSIS — Q7962 Hypermobile Ehlers-Danlos syndrome: Secondary | ICD-10-CM | POA: Insufficient documentation

## 2022-03-19 DIAGNOSIS — K3184 Gastroparesis: Secondary | ICD-10-CM

## 2022-03-19 DIAGNOSIS — Q0700 Arnold-Chiari syndrome without spina bifida or hydrocephalus: Secondary | ICD-10-CM

## 2022-03-19 NOTE — H&P (Signed)
RHEUMATOLOGY, [facility]  [place] [address]  Operated by [facility]  History and Physical     Name: Holly Fritz MRN:  [id number]   Date: 03/19/2022 Age: 42 y.o.     HPI  I had the pleasure of meeting Holly Fritz in clinic for a PMH of Chiari 1 malformation, gastroparesis, dysautonomia, POTS, lichen planus of the elbows, Glover's disease, and Ehlers-Danlos syndrome today for establishment of care for Ehlers-Danlos syndrome.    Patient presents for establishment of care for Ehlers-Danlos syndrome (hypermobile type). Patient was diagnosed with Ehlers-Danlos syndrome in 2016, although she had symptoms of hypermobile joints throughout her life (even during childhood). She states that in 2016, she was having a great deal of difficulty with maintaining PO intake and she ultimately was found to have evidence of Chiari malformation. During the pre-operative evaluation, she was also diagnosed with Ehler-Danlos syndrome.    She states that she has chronica pain of multiple joints including the back, ankle, and other joints, as well as headaches. She describes the pain as aching in nature. She also endorses easy bruising. She notes nausea as well. She mentions dry eyes and dry mouth. She also mentions night sweats as well. She reports frequent injuries.    She has chronically had great difficulty with PO intake, due to dysautonomia and GI motility abnormalities, at one point needing to be placed on TPN, although she has been off TPN since 2019.    She mentions photophobia and indeed has wears sunglasses to clinic today.    Review of Systems (negative except as noted; positive findings in bold): As above. In addition, as below:  Constitutional: weight loss (due to decreased PO intake), sweats  Skin: rashes, easy bruisability  Musculoskeletal: back pain, joint pain  Psychiatric: anxiety, depression    Past Medical History  Past Medical History:   Diagnosis Date     Autoimmune disorder (CMS HCC)     Chiari I malformation (CMS HCC)     Chronic pain     Convergence insufficiency     Dysautonomia (CMS HCC)     Ehlers-Danlos syndrome     Ehlers-Danlos syndrome     Gastroparesis     Headache          Current Outpatient Medications   Medication Sig    aspirin-acetaminophen-caffeine (EXCEDRIN) 250-250-65 mg Oral Tablet Take 2 Tablets by mouth Every 6 hours as needed for Migraine    capsaicin (ZOSTRIX) 0.025 % Cream by Apply Topically route Twice daily    cetirizine (ZYRTEC) 10 mg Oral Tablet Take 1 Tablet (10 mg total) by mouth Once a day    Cholecalciferol, Vitamin D3, (VITAMIN D) 25 mcg (1,000 unit) Oral Capsule Take 1,000 Units by mouth Twice daily (Patient not taking: Reported on 03/02/2022)    clonazePAM (KLONOPIN) 0.5 mg Oral Tablet TAKE 1 TABLET BY MOUTH DAILY AS NEEDED FOR SEVERE ANXIETY. MAX 30 TABS IN 30 DAYS    cyclobenzaprine (FLEXERIL) 10 mg Oral Tablet TAKE 1 TABLET 3 TIMES A DAY BY ORAL ROUTE.    diphenhydrAMINE (BENADRYL) 25 mg Oral Capsule Take 1 Capsule (25 mg total) by mouth Once per day as needed    hydrOXYzine HCL (ATARAX) 25 mg Oral Tablet Take 1 Tablet (25 mg total) by mouth Every 8 hours as needed for Itching    Ibuprofen (MOTRIN) 600 mg Oral Tablet Take 1 Tab (600 mg total) by mouth Four times a day as needed for  Pain    lamoTRIgine (LAMICTAL) 100 mg Oral Tablet Take 1 Tablet (100 mg total) by mouth Twice daily    mirtazapine (REMERON) 30 mg Oral Tablet Take 30 mg by mouth Every night (Patient not taking: Reported on 03/02/2022)    OLANZapine (ZYPREXA) 10 mg Oral Tablet Take 10 mg by mouth Every night (Patient not taking: Reported on 03/02/2022)    OLANZapine (ZYPREXA) 5 mg Oral Tablet Take 5 mg by mouth Every morning (Patient not taking: Reported on 03/02/2022)    ondansetron (ZOFRAN ODT) 4 mg Oral Tablet, Rapid Dissolve Take 1 Tab (4 mg total) by mouth Every 8 hours as needed for Nausea/Vomiting    QUEtiapine (SEROQUEL) 100 mg Oral Tablet Take 1 Tablet (100 mg  total) by mouth Every night    sodium chloride (SALINE NOSE NASL) by Nasal route    tiZANidine (ZANAFLEX) 4 mg Oral Tablet Take 4 mg by mouth Three times a day as needed (Patient not taking: Reported on 03/02/2022)     Allergies   Allergen Reactions    Adhesive Rash    Doxycycline Rash     Agitation      Propranolol Hives/ Urticaria, Itching and Rash    Ciprofloxacin     Gabapentin  Other Adverse Reaction (Add comment)     Patient reports is "sensitive" to medication.    Levaquin [Levofloxacin]     Oxycodone      Patient reports "sensitivity" to medication.    Penicillins      unknown reaction when an infant - HAS TOLERATED KEFLEX    Tapentadol      Patient reports "sensitivity" to medication.       Family History  Family Medical History:       Problem Relation (Age of Onset)    Lymphoma Mother    No Known Problems Father, Sister, Brother, Maternal Grandmother, Maternal Grandfather, Paternal Grandmother, Paternal Grandfather, Daughter, Son, Maternal 77, Maternal Uncle, Paternal 6, Paternal Uncle, Other            Social History  Social History     Socioeconomic History    Marital status: Married   Tobacco Use    Smoking status: Every Day     Packs/day: 1.50     Years: 20.00     Additional pack years: 0.00     Total pack years: 30.00     Types: Cigarettes    Smokeless tobacco: Never   Vaping Use    Vaping Use: Never used   Substance and Sexual Activity    Alcohol use: Not Currently    Drug use: Yes     Types: Marijuana       Problem List  Patient Active Problem List   Diagnosis    Bacteremia due to coagulase-negative Staphylococcus    Chiari I malformation (CMS HCC)    Ehlers-Danlos syndrome    Gastroparesis       Physical Examination  There were no vitals taken for this visit.      Wt Readings from Last 3 Encounters:   03/02/22 51.7 kg (113 lb 15.3 oz)   05/08/18 63.5 kg (140 lb)   05/03/18 64.5 kg (142 lb 4.8 oz)      BP Readings from Last 3 Encounters:   03/02/22 103/75   05/08/18 117/87   05/05/18 118/84      General: No acute distress. Normal affect.  Skin: Regions of erythema and scaling consistent with lichen planus of the bilateral elbows. Collection of 2-3 papules  in the upper back immediately to the left of midline.  HEENT: Extra-ocular movements gross intact. Sclerae normal.  Neck: Trachea appears midline. No gross thyromegaly.  Chest: Clear to auscultation bilaterally. No wheezes, rales, or crackles.  CV: S1 S2, RRR. No murmurs, gallops, or rubs.  Extremities: Radial pulses 2+ bilaterally.  MSK: Hypermobile joints: patient is able to touch the ventral surface of her forearms with the dorsal surface of her thumbs bilaterally. Bilateral MCP joints are able to extend greater than 90 degrees. DIP's and PIP's are able to extend greater than 10 degrees. Patient not able to touch toes with fingers from standing position.  Neurologic: Grossly intact.    Labs and Imaging   I have review and discussed these results with patients.   Lab Results   Component Value Date    WBC 8.5 12/07/2021    RBC 4.32 12/07/2021    HGB 13.0 12/07/2021    HCT 39.3 12/07/2021    MCV 91.0 12/07/2021    MCH 30.1 12/07/2021    MCHC 33.1 12/07/2021    MPV 9.1 12/07/2021      Lab Results   Component Value Date    BUN 14 09/11/2018    CREATININE 0.70 09/11/2018    CO2 24 09/11/2018    CALCIUM 9.5 09/11/2018    ALKPHOS 78 09/11/2018    AST 23 09/11/2018    ALT 13 09/11/2018     No components found for: "CHOLTOT"  No results found for: "HDL"  Lab Results   Component Value Date    TRIG 71 12/07/2021    TRIG 165 (H) 09/11/2018    TRIG 119 03/02/2018     No components found for: "LDL"  No components found for: "CHOLHDL"  No results found for: "ANA"  No components found for: "RF"    Dexa Results: None.    Assessment and Plan:   Holly Fritz is a 42 y.o. female with a PMH of Chiari 1 malformation, gastroparesis, dysautonomia, POTS, lichen planus of the elbows, Glover's disease, and Ehlers-Danlos syndrome who presents for establishment of care for  Ehlers-Danlos syndrome.  - Referral to physical therapy and occupational therapy  - Follow up in 6 months: 10/03/2022    Kelvin Cellar, MD  15:22 03/19/2022    ATTENDING ATTESTATION: I saw this patient with the resident and I agree with the details of the visit outlined in the note. I confirmed key portions of the history and physical exam and the assessment and plan was developed with me at the time of the visit.    No disclocations.  No early cardiac death in the family.  [place] Chitto father died during AAA repair.    On exam she is thin and in no acute distress.  Pale without rashes, wearing dark glasses.  She subluxes her MCPS when pushed to 90 degrees of extension.  She has a Beighton score of 4, spine flexion is limited by scoliosis.    We had some additional conversation after Dr. Marcine Matar left for conference. She is uncertain how or when she was diagnosed with autoimmune versions of autonomic ganglionopathy, neuropathy and enteropathy, probably at [facility] where she had an extensive work up, or possibly in [place]. As her gut is a difficult way for ehr to take medications, discussed multiple topical options including: diclofenac (Voltaren) gel, compounded topical cream (gabapentin, meloxicam, lidocaine), capsaicin, OTC lidocaine patches.  She may have an element of central sensitization.    Recommend PT to assist in joint protection  and strengthen muscles to help support joints.  Hydrotherapy is ideal to begin if she can find it locally.  Also recommended OT for hands and possible splints as subluxes.    Recommended reaching out to a compounding pharmacy.  May be able to tolerate some medications in a different form to aid in absorption, and may benefit from topical compunded cream for pain relief.    Queen Blossom, MD, MPH     > 60 minutes were spent on the day of the visit.

## 2022-03-25 ENCOUNTER — Ambulatory Visit (HOSPITAL_BASED_OUTPATIENT_CLINIC_OR_DEPARTMENT_OTHER): Payer: Self-pay | Admitting: Student in an Organized Health Care Education/Training Program

## 2022-04-04 ENCOUNTER — Other Ambulatory Visit (HOSPITAL_BASED_OUTPATIENT_CLINIC_OR_DEPARTMENT_OTHER): Payer: Self-pay | Admitting: Student in an Organized Health Care Education/Training Program

## 2022-04-04 DIAGNOSIS — M25531 Pain in right wrist: Secondary | ICD-10-CM

## 2022-04-08 ENCOUNTER — Inpatient Hospital Stay
Admission: RE | Admit: 2022-04-08 | Discharge: 2022-04-08 | Disposition: A | Discharge status: Home / Self Care | Payer: MEDICAID | Source: Ambulatory Visit | Attending: Student in an Organized Health Care Education/Training Program | Admitting: Student in an Organized Health Care Education/Training Program

## 2022-04-08 ENCOUNTER — Other Ambulatory Visit: Payer: Self-pay

## 2022-04-08 DIAGNOSIS — M25531 Pain in right wrist: Secondary | ICD-10-CM

## 2022-04-10 ENCOUNTER — Other Ambulatory Visit: Payer: Self-pay

## 2022-04-10 ENCOUNTER — Encounter (HOSPITAL_BASED_OUTPATIENT_CLINIC_OR_DEPARTMENT_OTHER): Payer: Self-pay | Admitting: Student in an Organized Health Care Education/Training Program

## 2022-04-10 ENCOUNTER — Ambulatory Visit
Payer: MEDICAID | Attending: Student in an Organized Health Care Education/Training Program | Admitting: Student in an Organized Health Care Education/Training Program

## 2022-04-10 VITALS — BP 104/72 | HR 91 | Temp 97.8°F | Resp 16 | Ht 64.0 in | Wt 112.2 lb

## 2022-04-10 DIAGNOSIS — R200 Anesthesia of skin: Secondary | ICD-10-CM | POA: Insufficient documentation

## 2022-04-10 DIAGNOSIS — R202 Paresthesia of skin: Secondary | ICD-10-CM | POA: Insufficient documentation

## 2022-04-10 DIAGNOSIS — R531 Weakness: Secondary | ICD-10-CM | POA: Insufficient documentation

## 2022-04-10 DIAGNOSIS — Q796 Ehlers-Danlos syndrome, unspecified: Secondary | ICD-10-CM | POA: Insufficient documentation

## 2022-04-10 DIAGNOSIS — Q0700 Arnold-Chiari syndrome without spina bifida or hydrocephalus: Secondary | ICD-10-CM | POA: Insufficient documentation

## 2022-04-10 NOTE — Progress Notes (Signed)
[facility]  [place] [id number]  Operated by [facility]     Name: Holly Fritz MRN:  [id number]   Date: 04/10/2022 Age: 42 y.o.   Sep 09, 1980     Chief Complaint:   Chief Complaint              Wrist Pain Right             Information for HPI:  Negative unless checked.  Location:  '[]'$  left, '[x]'$  right,  '[]'$  Bilateral, '[]'$  anterior,  '[]'$  medial,  '[]'$  lateral,  '[]'$  posterior              Quality:  '[x]'$  aching,  '[]'$  burning,  '[]'$  stabbing,  '[]'$  throbbing              Severity: '[]'$  no pain, '[]'$  mild, '[]'$  moderate, '[x]'$  severe, '[x]'$  worsening, '[]'$  no change  Duration: of onset: 8  '[]'$  hours, '[]'$  days, '[]'$  weeks, '[x]'$  months,  '[]'$  years  Context: '[]'$  atraumatic, '[]'$  overuse, '[]'$  fall, '[]'$  twisting, '[]'$  sports injury, '[]'$  exercise, '[]'$  work injury, '[]'$  MVA,  '[]'$  assault   Alleviating factors: '[x]'$  nothing helps, '[]'$  rest, ice or heat, '[]'$  NSAIDs,  '[]'$  tramadol, '[]'$  narcotics, '[]'$  physical therapy, '[]'$  cortisone injection, '[]'$  viscosupplementation injection, '[]'$  crutches,   '[]'$  previous surgery  Aggravating factor: '[]'$  standing, '[]'$  walking,  '[]'$  running, '[x]'$  ROM, '[]'$  exercise, '[]'$  squatting/kneeling,  '[]'$  going from sit to stand, '[]'$  stairs, '[]'$  weather changes, '[]'$  twisting  Associated symptoms: '[]'$ weakness,  '[]'$  tightness with prolonged sitting, '[]'$  swelling, '[]'$  numbness, '[]'$  tingling, '[]'$  radiation down leg, '[]'$   back pain, '[]'$  hip pain, '[x]'$   Popping/clicking, '[]'$  patellar instability, '[]'$  fevers/chills      Tried without improvement:  '[]'$  cane, '[]'$  brace, '[]'$  NSAIDs, '[]'$  tramadol, '[]'$  narcotics, '[]'$  physical therapy, '[]'$  cortisone injection, '[]'$  viscosupplementation, '[]'$  previous surgery   Previous surgery:  '[]'$ Yes '[x]'$  No ,  if yes, procedure, location, and date:     REVIEW OF SYSTEMS:  Negative unless checked.  General: '[]'$  pain. '[]'$  fevers '[]'$  chills. '[]'$  weight loss. '[]'$  fatigue.  Lymphatic: '[]'$  palpable masses. '[]'$  night sweats.  Heme: '[]'$  easy bruising '[]'$  bleeding. '[]'$  recurrent infections.   HEENT. '[]'$  vision changes '[]'$  hearing  changes. '[]'$  dysphagia. '[]'$  sore throat.   Heart: '[]'$  chest pain. '[]'$  palpitation. '[]'$  orthopnea. '[]'$  LE edema.   Lungs: '[]'$  dyspnea (on exertion) '[]'$  hemoptysis. '[]'$  cough.   Abdomen: '[]'$  poor appetite. '[]'$  abdominal pain. '[]'$  nausea '[]'$  vomiting. '[]'$  diarrhea. '[]'$  constipation.   GU: '[]'$  dysuria '[]'$  urgency. '[]'$  hematuria.   MS. '[]'$  joint pain '[]'$  extremity swelling. '[]'$  back pain. '[]'$  leg cramps '[]'$  swelling '[]'$  hip pain '[x]'$  joint popping/clicking '[]'$  instability '[]'$  radiation down leg  Dermatologic: '[]'$  rashes. '[]'$  pruritus.   Psychiatric: '[]'$  depression. '[]'$  anxiety. '[]'$  insomnia.   Neurologic: '[]'$  headaches. '[]'$  neuropathy. '[]'$  weakness. '[]'$  memory problems '[]'$  numbness  I have reviewed and agree with clinical information obtained by staff.     History of Present Illness:  Holly Fritz is a 42 y.o. female coming in with Wrist Pain (Right ). Patient presents with right wrist pain for several years, worsening in the last 6-8 months. She reports an aching pain to right wrist that extends into right thumb. Pain is significant with increased use of right hand per patient. Patient states that her wrist frequently pops. She has been using a wrist wrap for support. Patient  reports PMH of Ehlers-Danlos syndrome. She has been taking tylenol and ibuprofen prn for wrist pain, however it does not help much. Patient is currently unemployed. She is an everyday smoker of 0.5 ppd. She denies diabetes or renal failure.     Past Medical History:  Past Medical History:   Diagnosis Date    Autoimmune disorder (CMS HCC)     Chiari I malformation (CMS HCC)     Chronic pain     Convergence insufficiency     Dysautonomia (CMS HCC)     Ehlers-Danlos syndrome     Ehlers-Danlos syndrome     Gastroparesis     Headache           Past Surgical History:   Procedure Laterality Date    CHOLECYSTECTOMY LAPAROSCOPIC (SILS) N/A 05/05/2018    Performed by Audria Nine, MD at [place]      HX MYOMECTOMY      HX WISDOM TEETH EXTRACTION      POSTERIOR  FOSSA DECOMPRESSION      REMOVAL INFUSAPORT Right 08/27/2017    Performed by Audria Nine, MD at [facility] History:       Problem Relation (Age of Onset)    Lymphoma Mother    No Known Problems Father, Sister, Brother, Maternal Grandmother, Maternal Grandfather, Paternal 58, Paternal Grandfather, Daughter, Son, Maternal 19, Maternal Uncle, Paternal 57, Paternal Interior and spatial designer, Other            Social History     Socioeconomic History    Marital status: Married     Spouse name: Not on file    Number of children: Not on file    Years of education: Not on file    Highest education level: Not on file   Occupational History    Not on file   Tobacco Use    Smoking status: Every Day     Current packs/day: 1.50     Average packs/day: 1.5 packs/day for 20.0 years (30.0 ttl pk-yrs)     Types: Cigarettes    Smokeless tobacco: Never   Vaping Use    Vaping status: Never Used   Substance and Sexual Activity    Alcohol use: Not Currently    Drug use: Yes     Types: Marijuana    Sexual activity: Not on file   Other Topics Concern    Not on file   Social History Narrative    Not on file     Social Determinants of Health     Financial Resource Strain: Not on file   Transportation Needs: Not on file   Social Connections: Unknown (02/03/2022)    Received from [facility]: Not on file     Social Connections: Not on file   Intimate Partner Violence: Not on file   Housing Stability: Not on file      Social Determinants of [place] Strain   Not on file                   Transportation Needs   Not on file                   Social Connections   Feb 03, 2022: Unknown        Social Connections       Not on  file              Social Connections       Not on file                          Intimate Partner Violence   Not on file                   Housing Stability   Not on file                   Health Literacy   Not on file                   Employment  Status   Not on file                           Medications:  Current Outpatient Medications   Medication Sig    aspirin-acetaminophen-caffeine (EXCEDRIN) 250-250-65 mg Oral Tablet Take 2 Tablets by mouth Every 6 hours as needed for Migraine    capsaicin (ZOSTRIX) 0.025 % Cream by Apply Topically route Twice daily    cetirizine (ZYRTEC) 10 mg Oral Tablet Take 1 Tablet (10 mg total) by mouth Once a day    clobetasoL-gauze-silicone [id number] %- 4" X 4" Apply externally Kit Apply topically    clonazePAM (KLONOPIN) 0.5 mg Oral Tablet TAKE 1 TABLET BY MOUTH DAILY AS NEEDED FOR SEVERE ANXIETY. MAX 30 TABS IN 30 DAYS    cyclobenzaprine (FLEXERIL) 10 mg Oral Tablet Take 1 Tablet (10 mg total) by mouth Every 8 hours as needed for Muscle spasms    diphenhydrAMINE (BENADRYL) 25 mg Oral Capsule Take 1 Capsule (25 mg total) by mouth Once per day as needed for Itching    famotidine (PEPCID) 10 mg Oral Tablet Take 1 Tablet (10 mg total) by mouth Twice daily    hydrOXYzine HCL (ATARAX) 25 mg Oral Tablet Take 1 Tablet (25 mg total) by mouth Every 8 hours as needed for Itching or Anxiety    Ibuprofen (MOTRIN) 600 mg Oral Tablet Take 1 Tab (600 mg total) by mouth Four times a day as needed for Pain    lamoTRIgine (LAMICTAL) 100 mg Oral Tablet Take 1 Tablet (100 mg total) by mouth Twice daily    mirtazapine (REMERON) 30 mg Oral Tablet Take 30 mg by mouth Every night (Patient not taking: Reported on 03/02/2022)    OLANZapine (ZYPREXA) 10 mg Oral Tablet Take 10 mg by mouth Every night (Patient not taking: Reported on 03/02/2022)    OLANZapine (ZYPREXA) 5 mg Oral Tablet Take 5 mg by mouth Every morning (Patient not taking: Reported on 03/02/2022)    ondansetron (ZOFRAN ODT) 4 mg Oral Tablet, Rapid Dissolve Take 1 Tab (4 mg total) by mouth Every 8 hours as needed for Nausea/Vomiting    QUEtiapine (SEROQUEL) 100 mg Oral Tablet Take 1 Tablet (100 mg total) by mouth Every night (Patient not taking: Reported on 04/10/2022)    sodium chloride (SALINE  NOSE NASL) by Nasal route    tacrolimus (PROGRAF) 1 mg Oral Capsule Take 1 Capsule (1 mg total) by mouth Twice daily (Patient not taking: Reported on 03/19/2022)    Tacrolimus 0.1 % Ointment Apply topically Twice daily    tiZANidine (ZANAFLEX) 4 mg Oral Tablet Take 4 mg by mouth Three times a day as needed (Patient not taking: Reported on 03/02/2022)  Allergies:  Allergies   Allergen Reactions    Adhesive Rash    Doxycycline Rash     Agitation      Propranolol Hives/ Urticaria, Itching and Rash    Ciprofloxacin     Gabapentin  Other Adverse Reaction (Add comment)     Patient reports is "sensitive" to medication.    Levaquin [Levofloxacin]     Oxycodone      Patient reports "sensitivity" to medication.    Penicillins      unknown reaction when an infant - HAS TOLERATED KEFLEX    Tapentadol      Patient reports "sensitivity" to medication.        Physical Exam:  BP 104/72   Pulse 91   Temp 36.6 C (97.8 F) (Tympanic)   Resp 16   Ht 1.626 m ('5\' 4"'$ )   Wt 50.9 kg (112 lb 3.2 oz)   SpO2 98%   BMI 19.26 kg/m        Physical Exam  Vitals and nursing note reviewed.   Constitutional:       Appearance: Normal appearance. She is normal weight.   Neurological:      Mental Status: She is alert.       Right Hand Exam     Comments:  Numbness and tingling with diffuse pain all over her right upper extremity   Brighton criteria 5/9             No visits with results within 5 Day(s) from this visit.   Latest known visit with results is:   Appointment on 12/07/2021   Component Date Value Ref Range Status    CHOLESTEROL  12/07/2021 181  100 - 200 mg/dL Final    HDL CHOL 12/07/2021 47 (L)  >=50 mg/dL Final    TRIGLYCERIDES 12/07/2021 71  <150 mg/dL Final    LDL CALC 12/07/2021 121 (H)  <100 mg/dL Final    <100 mg/dL, Optimal  100-129 mg/dL, Near/Above Optimal  130-159 mg/dL, Borderline High  160-189 mg/dL, High  >=190 mg/dL, Very high    VLDL CALC 12/07/2021 12  <30 mg/dL Final    NON-HDL 12/07/2021 134  <=190 mg/dL Final     CHOL/HDL RATIO 12/07/2021 3.9   Final    TSH 12/07/2021 0.396  0.350 - 4.940 uIU/mL Final    Pregnant patients have trimester-specific ranges derived from this analyzer/technology. For singleton pregnancies:    First trimester, 0.15 - 2.45 uIU/mL  Second trimester, 0.35 - 3.20 uIU/mL  Third trimester, 0.40 - 4.30 uIU/mL    THYROXINE (T4), FREE 12/07/2021 0.90  0.70 - 1.48 ng/dL Final    Pregnant patients have trimester-specific reference intervals derived from a meta-analysis (Osinga et al, 2022):    First trimester: 0.85-1.45 ng/dL  Second trimester: 0.75-1.22 ng/dL  Third trimester: 0.69-1.13 ng/dL      WBC 12/07/2021 8.5  3.7 - 11.0 x10^3/uL Final    RBC 12/07/2021 4.32  3.85 - 5.22 x10^6/uL Final    HGB 12/07/2021 13.0  11.5 - 16.0 g/dL Final    HCT 12/07/2021 39.3  34.8 - 46.0 % Final    MCV 12/07/2021 91.0  78.0 - 100.0 fL Final    MCH 12/07/2021 30.1  26.0 - 32.0 pg Final    MCHC 12/07/2021 33.1  31.0 - 35.5 g/dL Final    RDW-CV 12/07/2021 13.0  11.5 - 15.5 % Final    PLATELETS 12/07/2021 359  150 - 400 x10^3/uL Final    MPV 12/07/2021 9.1  8.7 - 12.5 fL Final    NEUTROPHIL % 12/07/2021 76  % Final    LYMPHOCYTE % 12/07/2021 19  % Final    MONOCYTE % 12/07/2021 5  % Final    EOSINOPHIL % 12/07/2021 0  % Final    BASOPHIL % 12/07/2021 0  % Final    NEUTROPHIL # 12/07/2021 6.41  1.50 - 7.70 x10^3/uL Final    LYMPHOCYTE # 12/07/2021 1.59  1.00 - 4.80 x10^3/uL Final    MONOCYTE # 12/07/2021 0.45  0.20 - 1.10 x10^3/uL Final    EOSINOPHIL # 12/07/2021 <0.04  <=0.50 x10^3/uL Final    BASOPHIL # 12/07/2021 <0.04  <=0.20 x10^3/uL Final    IMMATURE GRANULOCYTE % 12/07/2021 0  0 - 1 % Final    The immature granulocyte fraction (IGF) quantifies total circulating myelocytes, metamyelocytes, and promyelocytes. It is used to evaluate immune responses to infection, inflammation, or other stimuli of the bone marrow. Caution is advised in interpreting test results in neonates who normally have greater numbers of circulating  immature blood cells.      IMMATURE GRANULOCYTE # 12/07/2021 <0.04  <0.10 x10^3/uL Final   ]    Radiology:    XR WRIST RIGHT performed on 04/08/2022 6:23 PM.     REASON FOR EXAM:  M25.531: Right wrist pain  right wrist pain     TECHNIQUE: 3 views/3 images submitted for interpretation.     FINDINGS: There is narrowing of the radiocarpal joint space. No definite displaced bony fracture. No subluxation.     IMPRESSION:  degenerative right wrist.     No displaced fracture.     Assessment:     ICD-10-CM    1. Ehlers-Danlos syndrome  Q79.60       2. 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 Numbness and tingling in right hand  R20.0     R20.2           Plan:   I saw the patient today as a referral from the ER for right hand pain. The patient has a history of diagnosis of ehlers-danlos syndrome. She has Brighton criteria by examination 5/9. She also gives a history of surgery for arnold chiari syndrome. She is complaining of generalized weakness of her right upper extremity with numbness and tingling affecting dermatomes C5-C7. I recommended cervical MRI with no contrast and referral to spine surgeon at [facility] for further assessment and follow up. The patient understood the condition and agreed to the plan.    No follow-ups on file.    Pete Glatter, MD

## 2022-04-18 ENCOUNTER — Inpatient Hospital Stay
Admission: RE | Admit: 2022-04-18 | Discharge: 2022-04-18 | Disposition: A | Discharge status: Home / Self Care | Payer: MEDICAID | Source: Ambulatory Visit | Attending: Student in an Organized Health Care Education/Training Program | Admitting: Student in an Organized Health Care Education/Training Program

## 2022-04-18 ENCOUNTER — Other Ambulatory Visit: Payer: Self-pay

## 2022-04-18 DIAGNOSIS — Q0700 Arnold-Chiari syndrome without spina bifida or hydrocephalus: Secondary | ICD-10-CM | POA: Insufficient documentation

## 2022-04-18 DIAGNOSIS — R202 Paresthesia of skin: Secondary | ICD-10-CM | POA: Insufficient documentation

## 2022-04-18 DIAGNOSIS — R200 Anesthesia of skin: Secondary | ICD-10-CM | POA: Insufficient documentation

## 2022-04-18 DIAGNOSIS — Q796 Ehlers-Danlos syndrome, unspecified: Secondary | ICD-10-CM | POA: Insufficient documentation

## 2022-04-19 ENCOUNTER — Ambulatory Visit (INDEPENDENT_AMBULATORY_CARE_PROVIDER_SITE_OTHER): Payer: Self-pay | Admitting: Anesthesiology

## 2022-04-19 NOTE — Telephone Encounter (Signed)
I spoke w/Shandria and advised of appt, date, time, location, and appointment details available on MY CHART as well if applicable.  Rebacca verbalized understanding.    Kind Regards and Appreciation,    Clyde Canterbury, CMA-Neurosurgery [phone]

## 2022-04-25 ENCOUNTER — Other Ambulatory Visit (INDEPENDENT_AMBULATORY_CARE_PROVIDER_SITE_OTHER): Payer: Self-pay | Admitting: Family

## 2022-04-25 DIAGNOSIS — E639 Nutritional deficiency, unspecified: Secondary | ICD-10-CM

## 2022-04-30 ENCOUNTER — Ambulatory Visit (INDEPENDENT_AMBULATORY_CARE_PROVIDER_SITE_OTHER): Payer: Self-pay | Admitting: Anesthesiology

## 2022-05-20 ENCOUNTER — Other Ambulatory Visit: Payer: Self-pay

## 2022-05-20 ENCOUNTER — Ambulatory Visit: Payer: MEDICAID | Attending: Neurological Surgery | Admitting: Neurological Surgery

## 2022-05-20 ENCOUNTER — Encounter (INDEPENDENT_AMBULATORY_CARE_PROVIDER_SITE_OTHER): Payer: Self-pay | Admitting: Neurological Surgery

## 2022-05-20 VITALS — BP 101/76 | HR 88 | Temp 96.5°F | Ht 64.0 in | Wt 108.5 lb

## 2022-05-20 DIAGNOSIS — M79644 Pain in right finger(s): Secondary | ICD-10-CM

## 2022-05-20 DIAGNOSIS — M47812 Spondylosis without myelopathy or radiculopathy, cervical region: Secondary | ICD-10-CM | POA: Insufficient documentation

## 2022-05-20 DIAGNOSIS — Q7969 Other Ehlers-Danlos syndromes: Secondary | ICD-10-CM

## 2022-05-20 DIAGNOSIS — M79601 Pain in right arm: Secondary | ICD-10-CM

## 2022-05-20 DIAGNOSIS — Q796 Ehlers-Danlos syndrome, unspecified: Secondary | ICD-10-CM | POA: Insufficient documentation

## 2022-05-20 DIAGNOSIS — R200 Anesthesia of skin: Secondary | ICD-10-CM | POA: Insufficient documentation

## 2022-05-20 DIAGNOSIS — M542 Cervicalgia: Secondary | ICD-10-CM

## 2022-05-20 DIAGNOSIS — R202 Paresthesia of skin: Secondary | ICD-10-CM | POA: Insufficient documentation

## 2022-05-20 NOTE — Progress Notes (Signed)
NEUROSURGERY, PHYSICIAN OFFICE CENTER  [address]  [place] [address]  Operated by [facility]  Progress Note    Name: Holly Fritz MRN:  [id number]   Date: 05/20/2022 DOB:  28-Jul-1980 (42 y.o.)     Right   Divorced   Not working      Referring Provider:   Guerry Bruin, MD  [address]  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 The patient reports a one year history of pain the right side of her neck that radiates laterally down her arm into her right thumb. The patient describes the pain as a burning, aching sensation that is worst in her thumb and wrist. She reports the pain is worsened with texting and writing with her right hand. She denies any left sided symptoms. She also notes right hand and wrist weakness where she feels like she is dropping things and notes an episode recently where her right arm was shaking while it was extended.     Conservative Treatment:   -Medications: Topical creams with no relief  -Physical Therapy: None reported (ordered by rheumatology for joint pain)  -Injection Therapy: None reported      Objective:   Vital Signs:  BP 101/76   Pulse 88   Temp (!) 35.8 C (96.5 F) (Thermal Scan)   Ht 1.626 m (5\' 4" )   Wt 49.2 kg (108 lb 7.5 oz)   LMP 05/08/2022 (Exact Date)   BMI 18.62 kg/m     Examination  Constitutional:Well groomed, in no apparent distress.   Skin:  Warm, dry, good color. No wounds or incisions  Head:  Normocephalic/ Atraumatic.   Eyes: Conjunctiva clear, no obvious abnormalities; EOMI. PERRL. No nystagmus. Wearing sunglasses in exam room due to photophobia.   ENT:  Tongue midline, trachea midline   Cardiovascular:    Peripheral vascular system: Pulses palpable, no peripheral edema   Musculoskeletal  Gait and Station: slow and steady with no ataxia  Strength:  Arm  Right Left Leg Right Left   Deltoid (shoulder abduction) 5/5 5/5 Hip Flexion 5/5 5/5   Biceps (Elbow flexion) 5/5 5/5 Knee Flexion 4/5 4/5   Triceps (Elbow extension) 5/5 5/5 Knee Extension 4/5 4/5   Grip 4/5 5/5 Foot Dorsiflexion 5/5 5/5      Foot Plantarflexion 5/5 5/5   Muscle tone (upper extremities): WNL  Muscle tone (lower extremities): WNL  Sensory: Sensory exam in the upper and lower extremities is normal  DTR's:    Biceps Triceps Patellar Achilles Hoffman's Reflex Ankle Clonus   Right 2+ 2+ 2+ 2+ Not present Not present   Left 2+ 2+ 2+ 2+ Not present Not present   Musculoskeletal tenderness: No tenderness with palpation of spinous processes or paraspinous muscles.   Neurological  Level of consciousness: Alert and oriented  Recent and remote memory: Good recall and able to follow commands  Attention span and concentration: Normal in conversation  Language/Speech: No aphasia or dysarthria  Fund of knowledge: Appropriate in this setting  Cranial Nerves  Cranial nerves II to XII intact.   2nd: PERRL  3rd,4th,6th:  EOMI, no nystagmus  5th: Facial sensation intact  7th: No facial asymmetry or droop  8th: Hearing grossly intact  9th/ 10th: Palate symmetric  11th: Normal shoulder shrug  12th:  Tongue midline    Data reviewed  04/18/22 MRI Cervical WO  Mild disc bulges at C4-5 and C5-6 as discussed above     Discussions with other providers:   Reviewed the chart notes.   Discussed the patient's case and reviewed available imaging with Dr. Cherrie Distance    Assessment:    Assessment/Plan   1. Ehlers-Danlos syndrome    2. Numbness and tingling in right hand    3. Pain of right thumb    4. Cervical spondylosis    5. Neck pain on right side    6. Right arm pain        Recommendations:  -The natural history, film findings, and indications for treatment were discussed.  -No spinal cord etiology for patient's symptoms   -No neurosurgical intervention warranted at this time. Follow up in clinic as needed for worsening, new, or  concerning symptoms.   -A physical therapy prescription was provided to the patient today for cervical spine  -The patient has been advised to follow up with their PCP in regards any chronic medical conditions and any non-neurosurgical symptoms that they may have   -Continue Medical Management (Diet, Exercise, Medication).       Particia Jasper, APRN,AGACNP-BC

## 2022-05-20 NOTE — Progress Notes (Signed)
I personally saw and examined the patient. See the mid-level provider's note for additional details. My findings are as follows:    History:  1 year history of aching pain right wrist. Sometimes can radiate up to shoulder. No numbness or weakness. Previous chiari decompression.    Exam:  motor and sensory intact.    Studies:  MRI shows mild bulge C5-6, good chiari decompression.    Imp:  mild cervical spondylosis, no neural compression to account for right arm symptoms.    Plan:  recommend PT, return as needed.      Barnabas Lister, MD 05/20/2022, 14:41

## 2022-05-24 ENCOUNTER — Telehealth (HOSPITAL_COMMUNITY): Payer: Self-pay | Admitting: "Technician

## 2022-05-24 NOTE — Telephone Encounter (Signed)
Left voicemail requesting return call to enroll in [employer] program beginning 07/26/2022. MyChart message sent. Marcellina Jonsson CHW [facility]

## 2022-05-31 ENCOUNTER — Other Ambulatory Visit: Payer: Self-pay

## 2022-05-31 ENCOUNTER — Inpatient Hospital Stay
Admission: RE | Admit: 2022-05-31 | Discharge: 2022-05-31 | Disposition: A | Discharge status: Home / Self Care | Payer: MEDICAID | Source: Ambulatory Visit | Attending: Cardiovascular Disease | Admitting: Cardiovascular Disease

## 2022-05-31 ENCOUNTER — Encounter (INDEPENDENT_AMBULATORY_CARE_PROVIDER_SITE_OTHER): Payer: Self-pay | Admitting: Cardiovascular Disease

## 2022-05-31 ENCOUNTER — Ambulatory Visit (INDEPENDENT_AMBULATORY_CARE_PROVIDER_SITE_OTHER): Payer: MEDICAID | Admitting: Cardiovascular Disease

## 2022-05-31 VITALS — BP 106/75 | HR 85 | Temp 97.0°F | Ht 64.0 in | Wt 107.4 lb

## 2022-05-31 DIAGNOSIS — G90A Postural orthostatic tachycardia syndrome (POTS): Secondary | ICD-10-CM

## 2022-05-31 DIAGNOSIS — R079 Chest pain, unspecified: Secondary | ICD-10-CM

## 2022-05-31 DIAGNOSIS — F17200 Nicotine dependence, unspecified, uncomplicated: Secondary | ICD-10-CM

## 2022-05-31 DIAGNOSIS — R002 Palpitations: Secondary | ICD-10-CM

## 2022-05-31 NOTE — Progress Notes (Signed)
CARDIOLOGY [facility] HEART & VASCULAR [place] CENTER  [address] [place] [address]      Cardiology New Patient Note     Name: Holly Fritz MRN:  [id number]   Date: 05/31/2022  Age:    42 y.o.     Date of Birth: 1980-03-29      Provider: Cletus Gash, MD  PCP: [place] Taylor Hott, NP    Reason for Visit: New Patient (POTS ) and Dizziness    History:     Holly Fritz is a 42 y.o. White female with a history of Ehlers-Danlos syndrome for which she had extensive evaluation at [facility] is here with new onset chest pain and worsening palpitation.  She describes her chest pain as anterior precordial heaviness, associated with mild dyspnea without diaphoresis nausea or emesis.  The pain is nonexertional.  She also reports palpitation described as fluttering of her heart which has been worsening lately.  Denies presyncope or syncope.  She sleeps on 1-2 pillows without PND.    Past medical history:  As listed under impression.    Social history:  Denies tobacco or alcohol use.  She smokes marijuana.    Family history:  Father is 63 with no heart disease.  Her mother lived to be 14 and died from complications of brain cancer.  She does have a maternal grandfather who died from ruptured thoracic aorta.    Pertinent labs:    EKG done today: Normal sinus rhythm, 80 per minute, essentially unremarkable.    Echocardiogram done August 27, 2017: Left ventricle ejection fraction 69%, concentric LVH, mild mitral regurgitation, moderate tricuspid regurgitation with normal aortic root dimension.  IVC noted to be dilated with less than 50% collapse.    Venous Doppler back in October 28, 2015 were essentially unremarkable..    Past Medical History:     Past Medical History:   Diagnosis Date    Autoimmune disorder (CMS HCC)     Cervicocranial syndrome     Chiari I malformation (CMS HCC)     Chronic pain     Convergence insufficiency     Dysautonomia (CMS HCC)      Ehlers-Danlos syndrome     Endometriosis     Gastroparesis     Headache     Scoliosis          Past Surgical History:     Past Surgical History:   Procedure Laterality Date    ESOPHAGEAL DILATION      HX LAP CHOLECYSTECTOMY      HX MYOMECTOMY      HX WISDOM TEETH EXTRACTION      POSTERIOR FOSSA DECOMPRESSION      2017         Allergies:     Allergies   Allergen Reactions    Adhesive Rash    Doxycycline Rash     Agitation      Propranolol Hives/ Urticaria, Itching and Rash    Ciprofloxacin     Gabapentin  Other Adverse Reaction (Add comment)     Patient reports is "sensitive" to medication.    Levaquin [Levofloxacin]     Oxycodone      Patient reports "sensitivity" to medication.    Penicillins      unknown reaction when an infant - HAS TOLERATED KEFLEX    Tapentadol      Patient reports "sensitivity" to medication.     Medications:     Current Outpatient Medications  Medication Sig    aspirin-acetaminophen-caffeine (EXCEDRIN) 250-250-65 mg Oral Tablet Take 2 Tablets by mouth Every 6 hours as needed for Migraine    capsaicin (ZOSTRIX) 0.025 % Cream by Apply Topically route Twice daily    cetirizine (ZYRTEC) 10 mg Oral Tablet Take 1 Tablet (10 mg total) by mouth Once a day    clobetasoL-gauze-silicone 0.05 %- 4" X 4" Apply externally Kit Apply topically    clonazePAM (KLONOPIN) 0.5 mg Oral Tablet Take 1 Tablet (0.5 mg total) by mouth Twice daily    cyclobenzaprine (FLEXERIL) 10 mg Oral Tablet Take 1 Tablet (10 mg total) by mouth Every 8 hours as needed for Muscle spasms    diphenhydrAMINE (BENADRYL) 25 mg Oral Capsule Take 1 Capsule (25 mg total) by mouth Once per day as needed for Itching    famotidine (PEPCID) 10 mg Oral Tablet Take 1 Tablet (10 mg total) by mouth Twice daily    hydrOXYzine HCL (ATARAX) 25 mg Oral Tablet Take 1 Tablet (25 mg total) by mouth Every 8 hours as needed for Itching or Anxiety    Ibuprofen (MOTRIN) 600 mg Oral Tablet Take 1 Tab (600 mg total) by mouth Four times a day as needed for Pain     lamoTRIgine (LAMICTAL) 100 mg Oral Tablet Take 1 Tablet (100 mg total) by mouth Twice daily    ondansetron (ZOFRAN ODT) 4 mg Oral Tablet, Rapid Dissolve Take 1 Tab (4 mg total) by mouth Every 8 hours as needed for Nausea/Vomiting    sodium chloride (SALINE NOSE NASL) by Nasal route    tacrolimus (PROGRAF) 1 mg Oral Capsule Take 1 Capsule (1 mg total) by mouth Twice daily    Tacrolimus 0.1 % Ointment Apply topically Twice daily     Family History:     Family Medical History:       Problem Relation (Age of Onset)    Lymphoma Mother    No Known Problems Father, Sister, Brother, Maternal Grandmother, Maternal Grandfather, Paternal Grandmother, Paternal Grandfather, Daughter, Son, Maternal Aunt, Maternal Uncle, Paternal Aunt, Paternal Uncle, Other            Social History:     Social History     Socioeconomic History    Marital status: Married   Tobacco Use    Smoking status: Every Day     Current packs/day: 1.50     Average packs/day: 1.5 packs/day for 20.0 years (30.0 ttl pk-yrs)     Types: Cigarettes    Smokeless tobacco: Never   Vaping Use    Vaping status: Never Used   Substance and Sexual Activity    Alcohol use: Not Currently    Drug use: Yes     Types: Marijuana     Social Determinants of Health      Received from [employer]    Social Connections     Review of Systems:     All pertinent systems were reviewed and are negative other than noted in the HPI.    Physical Exam:     BP 106/75   Pulse 85   Temp 36.1 C (97 F) (Thermal Scan)   Ht 1.626 m (5\' 4" )   Wt 48.7 kg (107 lb 6.4 oz)   LMP 05/08/2022 (Exact Date)   SpO2 98%   BMI 18.44 kg/m        General: Alert and oriented in no apparent distress.    Neck: Supple, carotids are 3+ bilaterally without audible bruits.    Cardiac examination:  No JVD, no S3, no friction rub and no murmur.    Lungs: Good air entry bilaterally without crackles or wheeze.    Abdomen:  Soft and nontender.    Extremities:  No pitting edema femoral pulses are 3+ without audible  bruits.  Ankle pulses are 2+ bilaterally.  CARDIAC MARKERS  Lab Results   Component Value Date    CPK 21 (L) 05/03/2018         BASIC METABOLIC PANEL  Lab Results   Component Value Date    SODIUM 137 09/11/2018    POTASSIUM 3.8 09/11/2018    CHLORIDE 104 09/11/2018    CO2 24 09/11/2018    ANIONGAP 9 (L) 09/11/2018    BUN 14 09/11/2018    CREATININE 0.70 09/11/2018    BUNCRRATIO 20 09/11/2018    GFR 110 09/11/2018    CALCIUM 9.5 09/11/2018    GLUCOSENF 90 09/11/2018        CBC  Diff   Lab Results   Component Value Date/Time    WBC 8.5 12/07/2021 02:13 PM    HGB 13.0 12/07/2021 02:13 PM    HCT 39.3 12/07/2021 02:13 PM    PLTCNT 359 12/07/2021 02:13 PM    ESR 7 08/27/2017 08:08 AM    RBC 4.32 12/07/2021 02:13 PM    MCV 91.0 12/07/2021 02:13 PM    MCHC 33.1 12/07/2021 02:13 PM    MCH 30.1 12/07/2021 02:13 PM    MPV 9.1 12/07/2021 02:13 PM    Lab Results   Component Value Date/Time    PMNS 76 12/07/2021 02:13 PM    LYMPHOCYTES 26 08/26/2017 09:34 PM    EOSINOPHIL 0 08/26/2017 09:34 PM    MONOCYTES 5 12/07/2021 02:13 PM    BASOPHILS 0 12/07/2021 02:13 PM    BASOPHILS <0.04 12/07/2021 02:13 PM    PMNABS 6.41 12/07/2021 02:13 PM    LYMPHSABS 1.59 12/07/2021 02:13 PM    EOSABS <0.04 12/07/2021 02:13 PM    MONOSABS 0.45 12/07/2021 02:13 PM           Lab Results   Component Value Date    CHOLESTEROL 181 12/07/2021    HDLCHOL 47 (L) 12/07/2021    LDLCHOL 121 (H) 12/07/2021    TRIG 71 12/07/2021      Hepatic Function  Lab Results   Component Value Date    ALBUMIN 4.8 09/11/2018    TOTALPROTEIN 7.9 09/11/2018    ALKPHOS 78 09/11/2018    PROTHROMTME 12.1 05/08/2018    INR 1.04 05/08/2018    AST 23 09/11/2018    ALT 13 09/11/2018          Lab Results   Component Value Date    TSH 0.396 12/07/2021    FREET4 0.90 12/07/2021             Recent Results (from the past 2160 hour(s))   XR ANKLE LEFT     Status: None    Narrative    Ellinor LEIGH Platz    Female, 42 years old.    XR ANKLE LEFT performed on 03/02/2022 4:59 PM.    REASON  FOR EXAM:  Z61.096: Left foot pain    TECHNIQUE: 3 views/3 images submitted for interpretation.    COMPARISON:  No priors    FINDINGS:  There are no fractures or dislocations. Soft tissues are unremarkable.      Impression     Normal study.        Radiologist location ID: [id number]  XR FOOT LEFT     Status: None    Narrative    Deletha LEIGH Darko    Female, 42 years old.    XR FOOT LEFT performed on 03/02/2022 5:00 PM.    REASON FOR EXAM:  Z61.096: Left foot pain    TECHNIQUE: 3 views/3 images submitted for interpretation.    COMPARISON:  No priors    FINDINGS:  There are no fractures or dislocations. Soft tissues are unremarkable.      Impression     Normal study.        Radiologist location ID: [id number]     XR WRIST RIGHT     Status: None    Narrative    Alitza LEIGH Duris    Female, 42 years old.    XR WRIST RIGHT performed on 04/08/2022 6:23 PM.    REASON FOR EXAM:  M25.531: Right wrist pain  right wrist pain    TECHNIQUE: 3 views/3 images submitted for interpretation.    FINDINGS: There is narrowing of the radiocarpal joint space. No definite displaced bony fracture. No subluxation.      Impression    degenerative right wrist.    No displaced fracture.      Radiologist location ID: [id number]     MRI SPINE CERVICAL WO CONTRAST     Status: None    Narrative    Ridhima LEIGH Sebastiano    Female, 42 years old.    MRI SPINE CERVICAL WO CONTRAST performed on 04/18/2022 12:10 PM.    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 Signal  within the bone marrow and cervical spinal cord is normal.    At C2-3 there is no abnormality.    At C3-4, there is no abnormality.    At C4-5, and minimal disc bulge produces mild flattening of thecal sac and mild bilateral neural foraminal stenosis.    At C5-6, a broad-based disc bulge produces mild flattening of thecal sac mild mass effect on the spinal cord. No neural foraminal stenosis.    At C6-7 there is no abnormality.    At C7-T1 there is no abnormality.      Impression     Mild disc bulges at C4-5 and C5-6 as discussed above      Radiologist location ID: [id number]            Cardiovascular Workup:   ECG: No results found for this or any previous visit (from the past [id number] hour(s)).    ECHO: Results for orders placed during the hospital encounter of 08/26/17    TRANSTHORACIC ECHOCARDIOGRAM - ADULT 08/27/2017  3:48 PM     Stress Test: No results found for this or any previous visit.   Cardiac Cath: No results found for this or any previous visit.     Assessment:       ICD-10-CM    1. Postural orthostatic tachycardia syndrome (POTS)  G90.A EKG 12 LEAD (Same Day, In Clinic)      2. Chest pain, unspecified type  R07.9 TRANSTHORACIC  ECHOCARDIOGRAM - ADULT     CTA HEART CORONARY      3. Palpitations  R00.2 14 DAY (HOME ENROLLMENT) EXTENDED HOLTER MONITOR        Impression:  1. Chest pain.    2. Palpitation which has been lately worsening.    3. Ehlers-Danlos syndrome.  4. History of depression.    Number 5 autoimmune enteropathy.    6. Autoimmune autonomic dysfunction.  7. Dysautonomia.  8. History of migraine.    Plan:     Orders Placed This Encounter    CTA HEART CORONARY    EKG 12 LEAD (Same Day, In Clinic)    14 DAY (HOME ENROLLMENT) EXTENDED HOLTER MONITOR    TRANSTHORACIC ECHOCARDIOGRAM - ADULT      Plan:  1. Holter monitoring for 1 week.    2. Echocardiogram.    3. Coronary CTA to assess coronaries and exclude aortopathy related to Ehlers-Danlos syndrome.    4. Return to cardiac clinic in 2-3 months  sooner if needed for further evaluation recommendation.    5. Home medications should be continued in the interim.    6. Periodic lipid profile under the direction of his primary care physician.      Thank you for allowing me to participate in the care of your patient. Please feel free to contact me if there are further questions.     Cletus Gash, MD

## 2022-06-04 LAB — ECG W INTERP (AMB USE ONLY)(MUSE,IN CLINIC)
Atrial Rate: 80 {beats}/min
Calculated P Axis: 76 degrees
Calculated R Axis: 82 degrees
Calculated T Axis: 33 degrees
PR Interval: 126 ms
QRS Duration: 76 ms
QT Interval: 362 ms
QTC Calculation: 417 ms
Ventricular rate: 80 {beats}/min

## 2022-06-06 ENCOUNTER — Encounter (HOSPITAL_COMMUNITY): Payer: Self-pay

## 2022-06-12 ENCOUNTER — Other Ambulatory Visit (INDEPENDENT_AMBULATORY_CARE_PROVIDER_SITE_OTHER): Payer: Self-pay | Admitting: Cardiovascular Disease

## 2022-06-12 ENCOUNTER — Other Ambulatory Visit (HOSPITAL_COMMUNITY): Payer: Self-pay | Admitting: Obstetrics & Gynecology

## 2022-06-12 DIAGNOSIS — R921 Mammographic calcification found on diagnostic imaging of breast: Secondary | ICD-10-CM

## 2022-06-13 MED ORDER — METOPROLOL TARTRATE 50 MG TABLET
50.0000 mg | ORAL_TABLET | Freq: Every day | ORAL | 0 refills | Status: DC
Start: 2022-06-13 — End: 2022-12-26

## 2022-06-22 ENCOUNTER — Encounter (HOSPITAL_COMMUNITY): Payer: Self-pay

## 2022-06-24 ENCOUNTER — Other Ambulatory Visit (INDEPENDENT_AMBULATORY_CARE_PROVIDER_SITE_OTHER): Payer: Self-pay | Admitting: Cardiovascular Disease

## 2022-06-24 DIAGNOSIS — R002 Palpitations: Secondary | ICD-10-CM

## 2022-06-24 DIAGNOSIS — R079 Chest pain, unspecified: Secondary | ICD-10-CM

## 2022-06-25 ENCOUNTER — Ambulatory Visit (HOSPITAL_COMMUNITY): Payer: Medicaid Other

## 2022-06-25 ENCOUNTER — Ambulatory Visit (HOSPITAL_COMMUNITY): Payer: Self-pay

## 2022-06-28 DIAGNOSIS — R002 Palpitations: Secondary | ICD-10-CM

## 2022-06-28 LAB — 14 DAY (HOME ENROLLMENT) EXTENDED HOLTER MONITOR
Heart rate (average): 82 {beats}/min
Isolated SVE count: 182 episodes
Isolated VE Counts: 90 episodes
Longest supraventricular tachycardia episode - duration: 3 s
Longest supraventricular tachycardia episode - heart rate (: 130 {beats}/min
Longest supraventricular tachycardia episode - number of be: 6 beats
SVE Couplets Counts: 10 episodes
SVE Triplets Counts: 4 episodes
Supraventricular tachycardia - heart rate (average): 130 {beats}/min
Supraventricular tachycardia - number of episodes: 1
Supraventricular tachycardia with fastest heart rate - dura: 3 s
Supraventricular tachycardia with fastest heart rate - hear: 130 {beats}/min
Supraventricular tachycardia with fastest heart rate - numb: 6 beats

## 2022-07-03 ENCOUNTER — Other Ambulatory Visit: Payer: Self-pay

## 2022-07-03 ENCOUNTER — Encounter (HOSPITAL_COMMUNITY): Payer: Self-pay

## 2022-07-03 ENCOUNTER — Ambulatory Visit
Admission: RE | Admit: 2022-07-03 | Discharge: 2022-07-03 | Disposition: A | Discharge status: Home / Self Care | Payer: Medicaid Other | Source: Ambulatory Visit | Attending: Obstetrics & Gynecology | Admitting: Obstetrics & Gynecology

## 2022-07-03 ENCOUNTER — Other Ambulatory Visit (HOSPITAL_COMMUNITY): Payer: Self-pay

## 2022-07-03 DIAGNOSIS — R921 Mammographic calcification found on diagnostic imaging of breast: Secondary | ICD-10-CM | POA: Insufficient documentation

## 2022-07-10 ENCOUNTER — Ambulatory Visit
Admission: RE | Admit: 2022-07-10 | Discharge: 2022-07-10 | Disposition: A | Discharge status: Home / Self Care | Payer: Medicaid Other | Source: Ambulatory Visit | Attending: Cardiovascular Disease | Admitting: Cardiovascular Disease

## 2022-07-10 ENCOUNTER — Other Ambulatory Visit: Payer: Self-pay

## 2022-07-10 DIAGNOSIS — R079 Chest pain, unspecified: Secondary | ICD-10-CM | POA: Insufficient documentation

## 2022-07-10 DIAGNOSIS — R002 Palpitations: Secondary | ICD-10-CM | POA: Insufficient documentation

## 2022-07-25 ENCOUNTER — Telehealth (INDEPENDENT_AMBULATORY_CARE_PROVIDER_SITE_OTHER): Payer: Self-pay | Admitting: Cardiovascular Disease

## 2022-07-25 ENCOUNTER — Ambulatory Visit
Admission: RE | Admit: 2022-07-25 | Discharge: 2022-07-25 | Disposition: A | Discharge status: Home / Self Care | Payer: Medicaid Other | Source: Ambulatory Visit | Attending: Cardiovascular Disease | Admitting: Cardiovascular Disease

## 2022-07-25 ENCOUNTER — Other Ambulatory Visit: Payer: Self-pay

## 2022-07-25 DIAGNOSIS — R002 Palpitations: Secondary | ICD-10-CM | POA: Insufficient documentation

## 2022-07-25 DIAGNOSIS — R079 Chest pain, unspecified: Secondary | ICD-10-CM

## 2022-07-25 DIAGNOSIS — R000 Tachycardia, unspecified: Secondary | ICD-10-CM

## 2022-07-25 NOTE — Respiratory Therapy (Signed)
Patient was unable to complete her stress test. Patient walked a few minutes on treadmill but when the speed changed she was unable to walk and legs started to drag she stated. Dr. Myrtha Mantis ended the test early. Patient needs to retry with nuclear/ lexi testing.

## 2022-07-25 NOTE — Telephone Encounter (Signed)
Sphar, Dreama Saa, MD  Cc: Elmon Kirschner, RN  Pt went in to have stress test today (regular treadmill test) she was unable to complete the test. Dr Myrtha Mantis recommended that she do the chemical one next time. They are still going to do the report for what she was able to do and put it in chart.

## 2022-08-04 ENCOUNTER — Ambulatory Visit: Payer: Medicaid Other | Attending: PHYSICIAN ASSISTANT | Admitting: PHYSICIAN ASSISTANT

## 2022-08-04 ENCOUNTER — Encounter (INDEPENDENT_AMBULATORY_CARE_PROVIDER_SITE_OTHER): Payer: Self-pay | Admitting: PHYSICIAN ASSISTANT

## 2022-08-04 ENCOUNTER — Other Ambulatory Visit: Payer: Self-pay

## 2022-08-04 VITALS — BP 110/75 | HR 93 | Temp 97.4°F | Ht 64.0 in | Wt 110.0 lb

## 2022-08-04 DIAGNOSIS — H6121 Impacted cerumen, right ear: Secondary | ICD-10-CM | POA: Insufficient documentation

## 2022-08-04 DIAGNOSIS — H73891 Other specified disorders of tympanic membrane, right ear: Secondary | ICD-10-CM | POA: Insufficient documentation

## 2022-08-04 DIAGNOSIS — H6591 Unspecified nonsuppurative otitis media, right ear: Secondary | ICD-10-CM | POA: Insufficient documentation

## 2022-08-04 MED ORDER — HYDROCORTISONE-ACETIC ACID 1 %-2 % EAR DROPS
3.0000 [drp] | OTIC | 0 refills | Status: AC
Start: 2022-08-04 — End: 2022-08-11

## 2022-08-04 MED ORDER — AZITHROMYCIN 250 MG TABLET
ORAL_TABLET | ORAL | 0 refills | Status: DC
Start: 2022-08-04 — End: 2022-12-03

## 2022-08-04 NOTE — Progress Notes (Signed)
[facility] [place] [facility]  [address]  [place] [id number]  Operated by [facility]     Name: Holly Fritz MRN:  [id number]   Date of Birth: 27-Feb-1980 Age: 42 y.o.   Date: 08/04/2022  Time: 17:41     Provider: Geralynn Rile, PA-C  PCP: [place] Taylor Hott, NP    Reason for visit:   Chief Complaint              Ear Pain Right ear fullness x 4 days            Vitals:   Vitals:    08/04/22 1616   BP: 110/75   Pulse: 93   Temp: 36.3 C (97.4 F)   TempSrc: Tympanic   SpO2: 99%   Weight: 49.9 kg (110 lb)   Height: 1.626 m (5\' 4" )   BMI: 18.92        History of Present Illness:  Holly Fritz is a 42 y.o. female presenting with complaints of right ear fullness.  Patient states symptoms have been present for about 4 days now.  Patient states she feels like there is water in her right ear, and reports some dizziness associated with her symptoms.  Patient denies any pain.  Denies any recent sore throats or upper respiratory symptoms.  Patient states she tried using Debrox at home which did not help.    Past Medical History:  Past Medical History:   Diagnosis Date    Autoimmune disorder (CMS HCC)     Cervicocranial syndrome     Chiari I malformation (CMS HCC)     Chronic pain     Convergence insufficiency     Dysautonomia (CMS HCC)     Ehlers-Danlos syndrome     Endometriosis     Gastroparesis     Headache     Scoliosis          Past Surgical History:  Past Surgical History:   Procedure Laterality Date    ESOPHAGEAL DILATION      HX LAP CHOLECYSTECTOMY      HX MYOMECTOMY      HX WISDOM TEETH EXTRACTION      POSTERIOR FOSSA DECOMPRESSION      2017         Allergies:  Allergies   Allergen Reactions    Adhesive Rash    Doxycycline Rash     Agitation      Propranolol Hives/ Urticaria, Itching and Rash    Ciprofloxacin     Gabapentin  Other Adverse Reaction (Add comment)     Patient reports is "sensitive" to medication.    Levaquin [Levofloxacin]     Oxycodone      Patient reports "sensitivity" to medication.     Penicillins      unknown reaction when an infant - HAS TOLERATED KEFLEX    Tapentadol      Patient reports "sensitivity" to medication.     Medications:  Current Outpatient Medications   Medication Sig    aspirin-acetaminophen-caffeine (EXCEDRIN) 250-250-65 mg Oral Tablet Take 2 Tablets by mouth Every 6 hours as needed for Migraine    azithromycin (ZITHROMAX) 250 mg Oral Tablet Take 500 mg (2 tab) on day 1; take 250 mg (1 tab) on days 2-5.    capsaicin (ZOSTRIX) 0.025 % Cream by Apply Topically route Twice daily    cetirizine (ZYRTEC) 10 mg Oral Tablet Take 1 Tablet (10 mg total) by mouth Once a day    clobetasoL-gauze-silicone 0.05 %-  4" X 4" Apply externally Kit Apply topically (Patient not taking: Reported on 08/04/2022)    clonazePAM (KLONOPIN) 0.5 mg Oral Tablet Take 1 Tablet (0.5 mg total) by mouth Twice daily    cyclobenzaprine (FLEXERIL) 10 mg Oral Tablet Take 1 Tablet (10 mg total) by mouth Every 8 hours as needed for Muscle spasms    diphenhydrAMINE (BENADRYL) 25 mg Oral Capsule Take 1 Capsule (25 mg total) by mouth Once per day as needed for Itching    famotidine (PEPCID) 10 mg Oral Tablet Take 1 Tablet (10 mg total) by mouth Twice daily    hydrocortisone-acetic acid (VOSOL-HC) 1-2 % Otic Drops Administer 3 Drops into the right ear Every 4 hours for 7 days    hydrOXYzine HCL (ATARAX) 25 mg Oral Tablet Take 1 Tablet (25 mg total) by mouth Every 8 hours as needed for Itching or Anxiety    Ibuprofen (MOTRIN) 600 mg Oral Tablet Take 1 Tab (600 mg total) by mouth Four times a day as needed for Pain    lamoTRIgine (LAMICTAL) 100 mg Oral Tablet Take 1 Tablet (100 mg total) by mouth Twice daily    metoprolol tartrate (LOPRESSOR) 50 mg Oral Tablet Take 1 Tablet (50 mg total) by mouth Once a day for 1 dose    ondansetron (ZOFRAN ODT) 4 mg Oral Tablet, Rapid Dissolve Take 1 Tab (4 mg total) by mouth Every 8 hours as needed for Nausea/Vomiting    sodium chloride (SALINE NOSE NASL) by Nasal route    tacrolimus  (PROGRAF) 1 mg Oral Capsule Take 1 Capsule (1 mg total) by mouth Twice daily    Tacrolimus 0.1 % Ointment Apply topically Twice daily     Family History:  Family Medical History:       Problem Relation (Age of Onset)    Lymphoma Mother    No Known Problems Father, Sister, Brother, Maternal Grandmother, Maternal Grandfather, Paternal Grandmother, Paternal Grandfather, Daughter, Son, Maternal Aunt, Maternal Uncle, Paternal Aunt, Paternal Uncle, Other            Social History:  Social History     Socioeconomic History    Marital status: Married   Tobacco Use    Smoking status: Every Day     Current packs/day: 1.50     Average packs/day: 1.5 packs/day for 20.0 years (30.0 ttl pk-yrs)     Types: Cigarettes    Smokeless tobacco: Never   Vaping Use    Vaping status: Never Used   Substance and Sexual Activity    Alcohol use: Not Currently    Drug use: Yes     Types: Marijuana     Social Determinants of Health      Received from [employer]    Social Connections    Received from [facility]    Housing Stability       Review of Systems:  All pertinent Review of System as address in the HPI.    Physical Exam:  General Appearance: Alert and oriented; No acute distress; Cooperative; Appears stated age; nontoxic; sitting comfortably; speaking clearly in full sentences  Head: Normocephalic; Atraumatic  Eyes: PERRL; EOMI; Conjunctivae clear  Ears:  Right external auditory canal with complete cerumen impaction, auditory canal clear with some mild erythema following irrigation; left external auditory canal clear; right tympanic membrane with some mild erythema and evidence of fluid posteriorly; left tympanic membrane clear with good visualization of landmarks; no evidence of perforation bilaterally  Nose: Nares patent; No drainage appreciated  Throat:  Posterior oropharynx with mild erythema; no tonsillar exudates; uvula midline  Neck: Supple; Full ROM  Lungs: Clear to auscultation in all lung fields; No rhonchi; No wheezes    Heart: Regular rate and rhythm; No murmurs appreciated  Neurological: Alert and oriented     There are no exam notes on file for this visit.     Assessment:    ICD-10-CM    1. Right otitis media with effusion  H65.91       2. Impacted cerumen of right ear  H61.21       3. Erythema of tympanic membrane of right ear  H73.891            Plan:  Orders Placed This Encounter    azithromycin (ZITHROMAX) 250 mg Oral Tablet    hydrocortisone-acetic acid (VOSOL-HC) 1-2 % Otic Drops     Radiology technician performed irrigation of right ear with successful removal of cerumen impaction.  Auditory canal with some mild erythema that extends on to tympanic membrane.  Prescriptions for Zithromax and hydrocortisone/acetic acid otic drops sent to pharmacy.  Use medications as prescribed.  Also recommended use of nasal saline rinses, Flonase, and daily antihistamine.  Follow up with PCP within 1-2 weeks for re-evaluation.  Return if symptoms worsen or fail to improve.    Geralynn Rile, PA-C     Portions of this note may be dictated using voice recognition software or a dictation service. Variances in spelling and vocabulary are possible and unintentional. Not all errors are caught/corrected. Please notify the Thereasa Parkin if any discrepancies are noted or if the meaning of any statement is not clear.

## 2022-08-06 ENCOUNTER — Telehealth (INDEPENDENT_AMBULATORY_CARE_PROVIDER_SITE_OTHER): Payer: Self-pay | Admitting: Diabetes Educator

## 2022-08-06 ENCOUNTER — Other Ambulatory Visit (INDEPENDENT_AMBULATORY_CARE_PROVIDER_SITE_OTHER): Payer: Self-pay | Admitting: Cardiovascular Disease

## 2022-08-06 DIAGNOSIS — R079 Chest pain, unspecified: Secondary | ICD-10-CM

## 2022-08-06 DIAGNOSIS — Q796 Ehlers-Danlos syndrome, unspecified: Secondary | ICD-10-CM

## 2022-08-06 DIAGNOSIS — R0602 Shortness of breath: Secondary | ICD-10-CM

## 2022-08-06 DIAGNOSIS — G935 Compression of brain: Secondary | ICD-10-CM

## 2022-08-06 NOTE — Telephone Encounter (Signed)
Patient called in trying to make her MNT appointment and has not been successful with reaching the RD here. She inquired if we were located in [place] or [place] and was informed that we are in fact in [place] which surprised her. Had discussion with her which resulted in this educator stating that I will try to route the referral that was sent to [place] from her PCP over to the [facility] office (Renford Dills was the name that the patient had seen on a flyer for a Dietitian) and patient states that she will contact her provider for a new referral if necessary. Denied any further needs.

## 2022-08-08 NOTE — Progress Notes (Signed)
Patient presents today to discuss ultrasound results.     Mammogram completed and biopsy completed in October 2023. Patient reported post-biopsy hematoma from biopsy sight to chest. She reports this has resolved except for a small area at biopsy site that remains uncomfortable.     Patient would like further examination of the area to ensure no further treatment is required.     Area doesn't increase in size around menses but is more painful with generalized movement.     U/S pelvis 11/2021 - fibroid noted    Patient has history of endometriosis and fibroids with removal.   Patient reports heavy menstrual bleeding and bloating.   She would like to discuss any management options for her pain and heavy bleeding.   States surgical options are not possible for her at this time.

## 2022-08-11 NOTE — Progress Notes (Signed)
 HPI:  Holly Fritz is a 42 year old female who presents today for follow-up for breast pain and discussion of menorrhagia.    She had a mammogram completed and a right biopsy completed in October 2023. Patient reported post-biopsy hematoma from biopsy.  She reports that the hematoma  has resolved except for a small area at biopsy site that remains uncomfortable. She would like further evaluation of this area.     She also complains of menorrhagia. She has a history of having had a myomectomy years ago and she has another fibroid of about 7 cm.    She had a pelvic US  11/2021 that showed the uterus measures 8.8 x 8.6 x 5.89 cm and has a volume of 220.7 cc.  There is a fibroid projecting from the left posterior fundus measuring 7 x 6.4 x 6.6 cm.  The EMS is 4.7 mm.  There are bilateral ovarian cysts.     She is not in a position where she can do any surgery and she declines any hormonal treatments.     Allergies:  Allergies   Allergen Reactions   . Doxycycline Rash   . Penicillins    . Propranolol Hives       Medical History:  Past Medical History:   Diagnosis Date   . Anemia    . Anxiety    . Chiari malformation type I (HCC)    . Chronic intractable headache    . Ehlers-Danlos syndrome     hypermobile   . GERD (gastroesophageal reflux disease)    . Kidney stones    . Prolapsed uterus      Past Surgical History:   Procedure Laterality Date   . Central line placement     . CERVICAL BIOPSY  W/ LOOP ELECTRODE EXCISION     . CHIARI DECOMPRESSION     . CHOLECYSTECTOMY     . dental extractions     . MYOMECTOMY      2010 10 cm fibroid   . prolapsed rectum       Family History   Problem Relation Age of Onset   . Hypertension Biological Mother    . Brain Tumor Biological Mother         surgery complications   . Ca, Non-Hodgkins Lymphoma Maternal Grandmother         three times     Outpatient Medications Marked as Taking for the 08/08/22 encounter (Office Visit) with Bettye Landry DEL, MD   Medication Sig Dispense Refill   .  acetaminophen  (TYLENOL ) 500 mg oral tablet Take by mouth     . aspirin-acetaminophen -caffeine (EXCEDRIN EXTRA STRENGTH) 250-250-65 mg oral tablet Take 1 tablet by mouth every 6 hours as needed     . [DISCONTINUED] cetirizine  (ZYRTEC ) 10 mg oral tablet Take 1 tablet by mouth daily     . clonazePAM (KLONOPIN) 0.5 mg oral tablet      . cyclobenzaprine (FLEXERIL) 10 mg oral tablet Take by mouth     . diphenhydrAMINE  (BENADRYL  ALLERGY) 25 mg oral tablet Take 1 tablet by mouth daily as needed     . famotidine  (PEPCID ) 20 mg oral tablet Take by mouth daily     . lamoTRIgine (LAMICTAL) 100 mg oral tablet Take 100 mg by mouth 2 times a day     . ondansetron  (ZOFRAN -ODT) 4 mg oral disintegrating tablet        Social History     Socioeconomic History   . Marital status: Married  Tobacco Use   . Smoking status: Every Day     Current packs/day: 0.50     Types: Cigarettes   . Smokeless tobacco: Never   Substance and Sexual Activity   . Alcohol use: Never   . Drug use: Yes     Types: Marijuana   . Sexual activity: Not Currently     Partners: Male     Birth control/protection: Condom       Review of Systems:  Review of Systems   Constitutional: Negative.    HENT: Negative.     Eyes: Negative.    Respiratory: Negative.     Cardiovascular: Negative.    Gastrointestinal: Negative.    Endocrine: Negative.    Genitourinary:  Positive for menstrual problem.   Musculoskeletal: Negative.    Skin: Negative.    Allergic/Immunologic: Negative.    Neurological: Negative.    Hematological: Negative.    Psychiatric/Behavioral: Negative.     All other systems reviewed and are negative.     Examination:  BP 102/70   Ht 5' 4 (162.6 cm)   Wt 108 lb (49 kg)   LMP 06/24/2022 (Exact Date)   BMI 18.54 kg/m   GEN: NAD, WD, WN    Assessment/Plan:   Diagnosis ICD-10-CM Plan   1. Breast calcification, right  R92.1 BI MAMMOGRAM DIGITAL DIAGNOSTIC ALL VIEWS WITH AND WITHOUT CAD WITH TOMOSYNTHESIS RIGHT     BI US  BREAST COMPLETE RIGHT      2. Breast  pain  N64.4 BI US  BREAST COMPLETE RIGHT      3. Uterine leiomyoma, unspecified location  D25.9       4. Pelvic pain  R10.2       5. Endometriosis  N80.9       6. Dysuria  R30.0 URINE CULTURE     POCT URINALYSIS DIPSTICK AUTO W/O SCOPE     CANCELED: URINALYSIS, COMPLETE        Patient with right breast pain at site where she had biopsy  and subsequent hematoma, will get right breast US .  Patient also complains of possible UTI symptoms, UA was not consistent with UTI.  Patient with enlarged fibroid uterus, menorrhagia.  She declines any surgical intervention, she declines any minimally invasive procedures for symptoms such as uterine artery embolization.  I offered her a progesterone IUD which she declines. She is not interested in any hormonal treatments.    I spent a total of 42 minutes reviewing the patient chart, in discussion with the patient and documenting in the medical record.

## 2022-08-14 ENCOUNTER — Encounter (INDEPENDENT_AMBULATORY_CARE_PROVIDER_SITE_OTHER): Payer: Self-pay

## 2022-08-14 ENCOUNTER — Ambulatory Visit (INDEPENDENT_AMBULATORY_CARE_PROVIDER_SITE_OTHER): Payer: Self-pay

## 2022-08-14 NOTE — Nursing Note (Signed)
Received GI referral/notes in media from [place].     Diagnosis: nausea    Referral requesting Caralee Ates.     Please schedule with Maralyn Sago.     MyChart message sent to patient to call and schedule appt with GI clinic.       Ernestina Columbia, BSN  Referral Coordinator GI/Rheumatology

## 2022-08-29 ENCOUNTER — Other Ambulatory Visit (HOSPITAL_COMMUNITY): Payer: Self-pay | Admitting: Family

## 2022-08-29 DIAGNOSIS — E041 Nontoxic single thyroid nodule: Secondary | ICD-10-CM

## 2022-08-29 DIAGNOSIS — R590 Localized enlarged lymph nodes: Secondary | ICD-10-CM

## 2022-09-04 ENCOUNTER — Other Ambulatory Visit (HOSPITAL_COMMUNITY): Payer: Self-pay | Admitting: Obstetrics & Gynecology

## 2022-09-04 DIAGNOSIS — R921 Mammographic calcification found on diagnostic imaging of breast: Secondary | ICD-10-CM

## 2022-09-04 DIAGNOSIS — N644 Mastodynia: Secondary | ICD-10-CM

## 2022-09-05 ENCOUNTER — Other Ambulatory Visit (HOSPITAL_COMMUNITY): Payer: Self-pay | Admitting: Family

## 2022-09-05 ENCOUNTER — Ambulatory Visit: Payer: Medicaid Other | Attending: Family | Admitting: Registered"

## 2022-09-05 ENCOUNTER — Other Ambulatory Visit: Payer: Self-pay

## 2022-09-05 ENCOUNTER — Ambulatory Visit
Admission: RE | Admit: 2022-09-05 | Discharge: 2022-09-05 | Disposition: A | Discharge status: Home / Self Care | Payer: Medicaid Other | Source: Ambulatory Visit | Attending: Family | Admitting: Family

## 2022-09-05 DIAGNOSIS — M542 Cervicalgia: Secondary | ICD-10-CM

## 2022-09-05 DIAGNOSIS — Z681 Body mass index (BMI) 19 or less, adult: Secondary | ICD-10-CM | POA: Insufficient documentation

## 2022-09-05 DIAGNOSIS — R636 Underweight: Secondary | ICD-10-CM | POA: Insufficient documentation

## 2022-09-05 DIAGNOSIS — Z713 Dietary counseling and surveillance: Secondary | ICD-10-CM | POA: Insufficient documentation

## 2022-09-05 DIAGNOSIS — E46 Unspecified protein-calorie malnutrition: Secondary | ICD-10-CM | POA: Insufficient documentation

## 2022-09-06 ENCOUNTER — Ambulatory Visit
Admission: RE | Admit: 2022-09-06 | Discharge: 2022-09-06 | Disposition: A | Discharge status: Home / Self Care | Payer: Medicaid Other | Source: Ambulatory Visit | Attending: Obstetrics & Gynecology | Admitting: Obstetrics & Gynecology

## 2022-09-06 DIAGNOSIS — N644 Mastodynia: Secondary | ICD-10-CM | POA: Insufficient documentation

## 2022-09-06 DIAGNOSIS — R921 Mammographic calcification found on diagnostic imaging of breast: Secondary | ICD-10-CM | POA: Insufficient documentation

## 2022-09-09 ENCOUNTER — Encounter (HOSPITAL_COMMUNITY): Payer: Self-pay | Admitting: Registered"

## 2022-09-09 NOTE — Progress Notes (Signed)
Nutrition Counseling Completed  Patient's Name: Holly Fritz  Medical Record #: [id number]  Medical Dx: Under Nourished  Provider Referral/Signature: [place] Hott  RDN Name: Renford Dills CDM, CFPP, NDTR, RDN, LDN  Date of Service: 09/05/2022  Time In 12:30pm Time Out 1:30pm Total Units 2  MNT CPT Code(s) [phone]  Individual: yes Group: no    Weight 110# (08/04/2022), IBW 92%, BMI 18.9- (18.5-24.9) within  normal weight range. Patient's caloric needs are 1250-1500kcal/day at 25-30 kcal/kg/wt. Consulting today for tips on increasing calories.     We discussed in length her current medical condition.  Patient began describing all of her medical conditions.  She stated she had TPN for a year and a half in 2019.  She has delayed motility and esophageal spasms.  She has had her esophagus stretched 2 times in 2018.  She did state that food items such as hamburger and corn get stuck in her throat.  Bloating occurs quite frequently after she eats.  Her issues with eating has been for the past 2-3 years.  She does have an appointment with a gastroenterologist in October.  She does not eat regular meals.  Usually eating in the evening.  She also admitted in 2016 she was 85 lb.  Patient has several food insecurity such as no car and no money.  Community health workers have helped her in the past.  She lives with her grandmother and she states her grandmother's to proud to go to the food bank to get food.  The grandmother does get meals on wheels 1 time per day.  She also stated that she is recently divorced and has psychological issues.  Supplements were discussed.  She stated that she had drank ensure in the past.  Coupons given to her.  We reviewed several high-protein high-calorie foods, shakes recipes.  Her current labs are within normal limits.  All information was reviewed and given to her.  All questions were answered she verbalized understanding.  Contact information offered.    Patient has fair knowledge of the types of  food to eat and was open to suggestions.    Adherence Potential is good.    Goal is eat foods without pain.    Thank you for the opportunity to consult this patient. Please feel free to contact me for further assistance.    Daune Perch CDM, CFPP, NDTR, RDN, LDN

## 2022-09-23 ENCOUNTER — Ambulatory Visit (HOSPITAL_COMMUNITY)
Admission: RE | Admit: 2022-09-23 | Discharge: 2022-09-23 | Disposition: A | Discharge status: Home / Self Care | Payer: Medicaid Other | Source: Ambulatory Visit | Attending: Cardiovascular Disease | Admitting: Cardiovascular Disease

## 2022-09-23 ENCOUNTER — Other Ambulatory Visit: Payer: Self-pay

## 2022-09-23 ENCOUNTER — Ambulatory Visit (HOSPITAL_COMMUNITY): Payer: Medicaid Other | Admitting: Nuclear Medicine

## 2022-09-23 ENCOUNTER — Ambulatory Visit
Admission: RE | Admit: 2022-09-23 | Discharge: 2022-09-23 | Disposition: A | Discharge status: Home / Self Care | Payer: Medicaid Other | Source: Ambulatory Visit | Attending: Cardiovascular Disease | Admitting: Cardiovascular Disease

## 2022-09-23 DIAGNOSIS — R0602 Shortness of breath: Secondary | ICD-10-CM

## 2022-09-23 DIAGNOSIS — R079 Chest pain, unspecified: Secondary | ICD-10-CM | POA: Insufficient documentation

## 2022-09-23 DIAGNOSIS — Q796 Ehlers-Danlos syndrome, unspecified: Secondary | ICD-10-CM | POA: Insufficient documentation

## 2022-09-23 DIAGNOSIS — G935 Compression of brain: Secondary | ICD-10-CM | POA: Insufficient documentation

## 2022-09-23 MED ORDER — SODIUM CHLORIDE 0.9 % (FLUSH) INJECTION SYRINGE
5.0000 mL | INJECTION | INTRAMUSCULAR | Status: AC
Start: 2022-09-23 — End: 2022-09-23
  Administered 2022-09-23: 5 mL

## 2022-09-23 MED ORDER — REGADENOSON 0.4 MG/5 ML INTRAVENOUS SYRINGE
0.4000 mg | INJECTION | INTRAVENOUS | Status: AC
Start: 2022-09-23 — End: 2022-09-23
  Administered 2022-09-23: 0.4 mg via INTRAVENOUS
  Filled 2022-09-23: qty 5

## 2022-09-24 LAB — MYOCARDIAL PERFUSION COMPLETE
EF: 59
END DIASTOLIC INDEX (NUC): 68
END DIASTOLIC INDEX: 75
END SYSTOLIC INDEX (NUC): 28
END SYSTOLIC INDEX: 34
Nuc Stress EF: 55 %
TID: 1.05

## 2022-09-30 ENCOUNTER — Other Ambulatory Visit: Payer: Self-pay

## 2022-09-30 ENCOUNTER — Ambulatory Visit
Admission: RE | Admit: 2022-09-30 | Discharge: 2022-09-30 | Disposition: A | Discharge status: Home / Self Care | Payer: Medicaid Other | Source: Ambulatory Visit | Attending: Family | Admitting: Family

## 2022-09-30 ENCOUNTER — Ambulatory Visit (HOSPITAL_COMMUNITY): Payer: Medicaid Other

## 2022-09-30 DIAGNOSIS — E041 Nontoxic single thyroid nodule: Secondary | ICD-10-CM | POA: Insufficient documentation

## 2022-09-30 DIAGNOSIS — R590 Localized enlarged lymph nodes: Secondary | ICD-10-CM | POA: Insufficient documentation

## 2022-10-03 ENCOUNTER — Ambulatory Visit (HOSPITAL_BASED_OUTPATIENT_CLINIC_OR_DEPARTMENT_OTHER): Payer: Self-pay | Admitting: Internal Medicine

## 2022-10-08 ENCOUNTER — Other Ambulatory Visit (HOSPITAL_COMMUNITY): Payer: Self-pay | Admitting: Family

## 2022-10-08 ENCOUNTER — Encounter (HOSPITAL_COMMUNITY): Payer: Self-pay | Admitting: Family

## 2022-10-08 DIAGNOSIS — J984 Other disorders of lung: Secondary | ICD-10-CM

## 2022-10-16 ENCOUNTER — Ambulatory Visit
Admission: RE | Admit: 2022-10-16 | Discharge: 2022-10-16 | Disposition: A | Discharge status: Home / Self Care | Payer: Medicaid Other | Source: Ambulatory Visit | Attending: Family | Admitting: Family

## 2022-10-16 ENCOUNTER — Other Ambulatory Visit: Payer: Self-pay

## 2022-10-16 DIAGNOSIS — J984 Other disorders of lung: Secondary | ICD-10-CM | POA: Insufficient documentation

## 2022-10-29 ENCOUNTER — Encounter (INDEPENDENT_AMBULATORY_CARE_PROVIDER_SITE_OTHER): Payer: Self-pay | Admitting: Cardiovascular Disease

## 2022-11-04 ENCOUNTER — Encounter (HOSPITAL_COMMUNITY): Payer: Self-pay | Admitting: Family

## 2022-11-04 DIAGNOSIS — E041 Nontoxic single thyroid nodule: Secondary | ICD-10-CM

## 2022-11-07 ENCOUNTER — Other Ambulatory Visit (HOSPITAL_COMMUNITY): Payer: Self-pay | Admitting: Family

## 2022-11-07 DIAGNOSIS — E041 Nontoxic single thyroid nodule: Secondary | ICD-10-CM

## 2022-11-29 ENCOUNTER — Ambulatory Visit (INDEPENDENT_AMBULATORY_CARE_PROVIDER_SITE_OTHER): Payer: Medicaid Other | Admitting: Cardiovascular Disease

## 2022-11-29 ENCOUNTER — Encounter (INDEPENDENT_AMBULATORY_CARE_PROVIDER_SITE_OTHER): Payer: Self-pay | Admitting: Cardiovascular Disease

## 2022-11-29 ENCOUNTER — Other Ambulatory Visit: Payer: Self-pay

## 2022-11-29 VITALS — BP 125/84 | HR 79 | Temp 97.0°F | Ht 64.0 in | Wt 104.7 lb

## 2022-11-29 DIAGNOSIS — F17210 Nicotine dependence, cigarettes, uncomplicated: Secondary | ICD-10-CM

## 2022-11-29 DIAGNOSIS — F1290 Cannabis use, unspecified, uncomplicated: Secondary | ICD-10-CM

## 2022-11-29 DIAGNOSIS — R002 Palpitations: Secondary | ICD-10-CM

## 2022-11-29 DIAGNOSIS — Q796 Ehlers-Danlos syndrome, unspecified: Secondary | ICD-10-CM

## 2022-11-29 DIAGNOSIS — R079 Chest pain, unspecified: Secondary | ICD-10-CM

## 2022-11-29 NOTE — Progress Notes (Signed)
CARDIOLOGY [facility] HEART & VASCULAR [place], [facility] CENTER  [address] [place] [address]      Cardiology Follow Up Note     Name: Holly Fritz MRN:  [id number]   Date: 11/29/2022  Age:    42 y.o.     Date of Birth: 08-03-1980      Provider: Cletus Gash, MD  PCP: [place] Taylor Hott, NP    Reason for Visit: Follow-up After Testing    History:     Holly Fritz is a 42 y.o. White female follow-up from [place] 19, 2024 post Holter, echo, stress test and MPI.  She continues to have chest pain described as anterior precordial discomfort.  Denies recent syncope.  No orthopnea or PND.      Pertinent labs:    Holter monitoring done Jul 10, 2022 for 2 weeks:  Heart rate variability between 52 to 154 with an average heart rate of 80 per minute.  One episode of narrow QRS complex tachycardia 6 beats at a rate of 154 per minute.  Rare PACs and PVCs.      Echocardiogram done Jul 10, 2022:  Left ventricular ejection fraction 60%, trace mitral, tricuspid and pulmonary regurgitation.  Normal aortic root dimension.      Regular stress test done July 25, 2022:  Nondiagnostic due to inability to attain target heart rate.      MPI done September 23, 2022:  No segmental reversible defect with artifact related to GI tracer activity.  Left ventricle ejection fraction 55%..    Past Medical History:     Past Medical History:   Diagnosis Date    Autoimmune disorder (CMS HCC)     Cervicocranial syndrome     Chiari I malformation (CMS HCC)     Chronic pain     Convergence insufficiency     Dysautonomia (CMS HCC)     Ehlers-Danlos syndrome     Endometriosis     Gastroparesis     Headache     Scoliosis          Past Surgical History:     Past Surgical History:   Procedure Laterality Date    ESOPHAGEAL DILATION      HX LAP CHOLECYSTECTOMY      HX MYOMECTOMY      HX WISDOM TEETH EXTRACTION      POSTERIOR FOSSA DECOMPRESSION      2017         Allergies:     Allergies   Allergen Reactions    Adhesive Rash     Doxycycline Rash     Agitation      Propranolol Hives/ Urticaria, Itching and Rash    Ciprofloxacin     Gabapentin  Other Adverse Reaction (Add comment)     Patient reports is "sensitive" to medication.    Levaquin [Levofloxacin]     Oxycodone      Patient reports "sensitivity" to medication.    Penicillins      unknown reaction when an infant - HAS TOLERATED KEFLEX    Tapentadol      Patient reports "sensitivity" to medication.     Medications:     Current Outpatient Medications   Medication Sig    aspirin-acetaminophen-caffeine (EXCEDRIN) 250-250-65 mg Oral Tablet Take 2 Tablets by mouth Every 6 hours as needed for Migraine    azithromycin (ZITHROMAX) 250 mg Oral Tablet Take 500 mg (2 tab) on day 1; take 250 mg (1 tab) on days 2-5.  capsaicin (ZOSTRIX) 0.025 % Cream by Apply Topically route Twice daily    cetirizine (ZYRTEC) 10 mg Oral Tablet Take 1 Tablet (10 mg total) by mouth Once a day    clobetasoL-gauze-silicone 0.05 %- 4" X 4" Apply externally Kit Apply topically (Patient not taking: Reported on 08/04/2022)    clonazePAM (KLONOPIN) 0.5 mg Oral Tablet Take 1 Tablet (0.5 mg total) by mouth Twice daily    cyclobenzaprine (FLEXERIL) 10 mg Oral Tablet Take 1 Tablet (10 mg total) by mouth Every 8 hours as needed for Muscle spasms    diphenhydrAMINE (BENADRYL) 25 mg Oral Capsule Take 1 Capsule (25 mg total) by mouth Once per day as needed for Itching    famotidine (PEPCID) 10 mg Oral Tablet Take 1 Tablet (10 mg total) by mouth Twice daily    hydrOXYzine HCL (ATARAX) 25 mg Oral Tablet Take 1 Tablet (25 mg total) by mouth Every 8 hours as needed for Itching or Anxiety    Ibuprofen (MOTRIN) 600 mg Oral Tablet Take 1 Tab (600 mg total) by mouth Four times a day as needed for Pain    lamoTRIgine (LAMICTAL) 100 mg Oral Tablet Take 1 Tablet (100 mg total) by mouth Twice daily    lidocaine (LIDODERM) 5 % Adhesive Patch, Medicated APPLY 1 PATCH BY TOPICAL ROUTE ONCE DAILY (MAY WEAR UP TO 12HOURS.)    lidocaine (XYLOCAINE)  5 % Ointment APPLY TO AFFECTED AREA 1-4 TIMES DAILY AS NEEDED    metoprolol tartrate (LOPRESSOR) 50 mg Oral Tablet Take 1 Tablet (50 mg total) by mouth Once a day for 1 dose    ondansetron (ZOFRAN ODT) 4 mg Oral Tablet, Rapid Dissolve Take 1 Tab (4 mg total) by mouth Every 8 hours as needed for Nausea/Vomiting    sodium chloride (SALINE NOSE NASL) by Nasal route    Tacrolimus 0.1 % Ointment Apply topically Twice daily     Family History:     Family Medical History:       Problem Relation (Age of Onset)    Lymphoma Mother    No Known Problems Father, Sister, Brother, Maternal Grandmother, Maternal Grandfather, Paternal Grandmother, Paternal Grandfather, Daughter, Son, Maternal Aunt, Maternal Uncle, Paternal Aunt, Paternal Uncle, Other            Social History:     Social History     Socioeconomic History    Marital status: Married   Tobacco Use    Smoking status: Every Day     Current packs/day: 1.50     Average packs/day: 1.5 packs/day for 20.0 years (30.0 ttl pk-yrs)     Types: Cigarettes    Smokeless tobacco: Never   Vaping Use    Vaping status: Never Used   Substance and Sexual Activity    Alcohol use: Not Currently    Drug use: Yes     Types: Marijuana     Social Determinants of Health      Received from [employer]    Social Connections   Intimate Partner Violence: Not At Risk (01/10/2022)    Received from [facility], [facility]    Interpersonal Violence     Interpersonal Violence: No    Received from [facility], [facility]    Housing Stability     Review of Systems:     All pertinent systems were reviewed and are negative other than noted in the HPI.    Physical Exam:     BP 125/84   Pulse  79   Temp 36.1 C (97 F) (Thermal Scan)   Ht 1.626 m (5\' 4" )   Wt 47.5 kg (104 lb 11.2 oz)   SpO2 99%   BMI 17.97 kg/m        General: Alert and oriented in no apparent distress.    Neck: Supple, carotids are 3+ bilaterally without audible bruits.    Cardiac  examination:  No JVD, no S3, no friction rub and no murmur.    Lungs:  Clear bilaterally.    Extremities:  No pitting edema with intact ankle pulses bilaterally.  CARDIAC MARKERS  Lab Results   Component Value Date    CPK 21 (L) 05/03/2018         BASIC METABOLIC PANEL  Lab Results   Component Value Date    SODIUM 137 09/11/2018    POTASSIUM 3.8 09/11/2018    CHLORIDE 104 09/11/2018    CO2 24 09/11/2018    ANIONGAP 9 (L) 09/11/2018    BUN 14 09/11/2018    CREATININE 0.70 09/11/2018    BUNCRRATIO 20 09/11/2018    GFR 110 09/11/2018    CALCIUM 9.5 09/11/2018    GLUCOSE Negative 05/08/2018    GLUCOSENF 90 09/11/2018        CBC  Diff   Lab Results   Component Value Date/Time    WBC 8.5 12/07/2021 02:13 PM    HGB 13.0 12/07/2021 02:13 PM    HCT 39.3 12/07/2021 02:13 PM    PLTCNT 359 12/07/2021 02:13 PM    ESR 7 08/27/2017 08:08 AM    RBC 4.32 12/07/2021 02:13 PM    MCV 91.0 12/07/2021 02:13 PM    MCHC 33.1 12/07/2021 02:13 PM    MCH 30.1 12/07/2021 02:13 PM    MPV 9.1 12/07/2021 02:13 PM    Lab Results   Component Value Date/Time    PMNS 76 12/07/2021 02:13 PM    LYMPHOCYTES 26 08/26/2017 09:34 PM    EOSINOPHIL 0 08/26/2017 09:34 PM    MONOCYTES 5 12/07/2021 02:13 PM    BASOPHILS 0 12/07/2021 02:13 PM    BASOPHILS <0.04 12/07/2021 02:13 PM    PMNABS 6.41 12/07/2021 02:13 PM    LYMPHSABS 1.59 12/07/2021 02:13 PM    EOSABS <0.04 12/07/2021 02:13 PM    MONOSABS 0.45 12/07/2021 02:13 PM           Lab Results   Component Value Date    CHOLESTEROL 181 12/07/2021    HDLCHOL 47 (L) 12/07/2021    LDLCHOL 121 (H) 12/07/2021    TRIG 71 12/07/2021      Hepatic Function  Lab Results   Component Value Date    ALBUMIN 4.8 09/11/2018    TOTALPROTEIN 7.9 09/11/2018    ALKPHOS 78 09/11/2018    PROTHROMTME 12.1 05/08/2018    INR 1.04 05/08/2018    AST 23 09/11/2018    ALT 13 09/11/2018          Lab Results   Component Value Date    TSH 0.396 12/07/2021    FREET4 0.90 12/07/2021             Recent Results (from the past 2160 hour(s))   XR  CERVICAL SPINE AP AND LAT W FLEX EXT     Status: None    Narrative    Anaaya LEIGH Molesworth    Female, 42 years old.    XR CERVICAL SPINE AP AND LAT W FLEX EXT performed on 09/05/2022 2:22 PM.  REASON FOR EXAM:  M54.2: Cervicalgia  cervicalgia    TECHNIQUE: 4 views/4 images submitted for interpretation.    FINDINGS: No fracture. No malalignment. No prevertebral soft tissue swelling. No listhesis with flexion or extension. Patient has had previous screw and plate fusion of the occiput.      Impression    Normal cervical spine. Please note that cervical spine precautions must continue until the cervical spine is clinically cleared by a physician.      Radiologist location ID: [id number]     BI US BREAST COMPLETE RIGHT     Status: None    Narrative    This result has an attachment that is not available.   US BREAST RIGHT-ADDL VIEWS/BREAST US AS REQ BY RAD     Status: None    Narrative    Tekeya LEIGH Primo    Female, 42 years old.    US BREAST RIGHT WHOLE-ADDL VIEWS/BREAST US AS REQ BY RAD performed on 09/06/2022 3:27 PM.    REASON FOR EXAM:  R92.1: Breast calcification, right  N64.4: Breast pain  breast pain    TECHNIQUE: Right breast complete ultrasound including all 4 quadrants, subareolar region, axillary region, and region of previous right breast biopsy and pain.    COMPARISON: Mammogram 07/03/2022    FINDINGS:  No mass, cyst, or other abnormality is identified sonographically.      Impression     No sonographic evidence of malignancy. Decision regarding further workup and management of the patient's right breast pain should be based on clinical grounds BI-RADS 1      Radiologist location ID: [id number]     US SOFT TISSUE NECK     Status: None    Narrative    Jada LEIGH Locy    Female, 42 years old.    US SOFT TISSUE NECK performed on 09/30/2022 2:30 PM.    REASON FOR EXAM:  R59.0: Localized enlarged lymph nodes  enlarged lymph nodes    TECHNIQUE: Hand-held ultrasound of both sides of the  neck    COMPARISON: No priors    FINDINGS:  Normal-appearing and normal-sized lymph nodes are seen bilaterally.  No masses are noted.      Impression     Normal-appearing lymph nodes bilaterally      Radiologist location ID: [id number]     US THYROID     Status: None    Narrative    Mollye LEIGH Edrington    Female, 42 years old.    US THYROID performed on 09/30/2022 2:33 PM.    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    Right thyroid lobe:5.3 cm x 1.7 cm x 1.8 cm. Volume: 9cc.     Left thyroid lobe: 5.2 cm x 1.7 cm x 1.3 cm. Volume: 6cc.     Nodules:  A 5 mm mixed nodule in the left isthmus, too small for TI-RADS scoring    Other:There are small bilateral: Cysts      Impression    A 5 mm mixed nodule in the left isthmus, too small for TI-RADS scoring.    Bilateral pulmonary cysts.    Grading and recommendations according to the ACR-thyroid imaging reporting and data system (TI-RADS).    TR 1: Benign, no FNA required.    TR 2: Not suspicious, no FNA required.    TR 3: Mildly suspicious.   FNA if = or  >  2.5cm.  Follow if = or >1.5cm in 1, 3 and 5 years.    TR 4: Moderately suspicious.  FNA = or  >1.5cm  Follow = or >1cm in 1, 2, 3 and 5 years.    T-R 5: Highly suspicious  FNA = or >1cm  Follow = or  >0.5cm every year for 5 years.          Radiologist location ID: [id number]     CT CHEST WO IV CONTRAST     Status: None    Narrative    Elzie LEIGH Marsch    Female, 42 years old.    CT CHEST WO IV CONTRAST performed on 10/16/2022 2:12 PM.    REASON FOR EXAM:  J98.4: Other disorders of lung  CLINICAL INDICATION: J98.4: Other disorders of lung  RADIATION CT DOSE: 160.01   TECHNIQUE: Helical images were obtained through the chest. ALARA technique using automated exposure control, adjustment of mA and/or kV according to patient size and/or iterative reconstruction.    COMPARISON:  08/27/2017    FINDINGS:  There is no axillary,  hilar, or mediastinal lymphadenopathy. No pleural or pericardial fluid. No coronary artery atherosclerotic calcification.    Mild scarring of both lung apices is noted. No suspicious nodules, masses, or consolidation are seen.      Impression    1. Mild scarring of both lung apices  2. No suspicious pulmonary nodules      Radiologist location ID: [id number]            Cardiovascular Workup:   ECG: No results found for this or any previous visit (from the past [id number] hour(s)).    ECHO: Results for orders placed during the hospital encounter of 07/10/22    TRANSTHORACIC ECHOCARDIOGRAM - ADULT 07/10/2022  9:43 AM    Narrative  **See full report in linked PDF document**    Version: 1    +--------------------------------------+  :                                      :  :                                      :  +--------------------------------------+  [address] [place], [place] [address]    Transthoracic Echocardiographic Report    ______________________________________________________________________________  Name: CHENILLE, PARDE                          MRN: [id number]                                   Weight: 107.002 lb  Study Date: 07/10/2022, 8: 39 AM                   DOB: Jul 05, 1980                                 Height: 64 in  Gender: Female  BSA: 1.50 m2  [facility]  Referring Physician: Cletus Gash  Ordering Physician: Cletus Gash  Tech: EGW  ______________________________________________________________________________  Technical Quality:: Technically difficult study due to limited acoustic windows.  Quality: Technically difficult study due to limited acoustic windows.    Reason For Study: Chest pain, unspecified type,Palpitations    Conclusions  The left ventricular ejection fraction by visual assessment is estimated to be 60%.  No significant valvular dx.    Findings:  Procedure:  Transthoracic complete echo, 2D, spectral and tissue Doppler, color flow Doppler, M-mode.  Left Ventricle: Normal left ventricular size. Normal geometry. Left ventricular systolic function is normal. The left ventricular ejection fraction by visual assessment is estimated to be 60%. No  segmental/regional wall motion abnormalities identified. Left ventricular diastolic parameters are normal.  Right Ventricle: Normal right ventricular size. Normal right ventricular systolic function.  Left Atrium: The left atrium is normal in size.  Right Atrium: The right atrium is of normal size.  Mitral valve: Mitral valve leaflets appear mildly thickened. No evidence of mitral stenosis. Trace mitral regurgitation present.  Tricuspid valve: The tricuspid valve is normal. Trace tricuspid regurgitation present.  Aortic valve: The aortic valve is not well visualized. No Aortic valve stenosis. No significant aortic regurgitation present.  Pulmonic valve: The pulmonic valve is not well visualized. Trace pulmonic valve regurgitation present.  Atrial Septum: The interatrial septum is normal in appearance.  IVC: Dilated IVC with >50% inspiratory collapse (estimated RA pressure: 8 mmHg).  Aorta: The aortic root is of normal size.  Pericardium: No significant pericardial effusion demonstrated.    2D/ M Mode                                                                        Doppler  LVIDd: 4.4 cm                             F: (3.8-5.2)/ M: (4.2-5.8)              AV Peak Vel: 121.0 cm/sec               (100-170  LVIDs: 3.1 cm                             F: (2.2-3.5/ M: 2.5-4.0)                                                       (70-90)  IVSd: 0.67 cm                             F: (0.6-0.9)/ M: (0.6-1.0               AV max PG: 6.0 mmHg                    (2.0-9.0)  AV Mean PG: 3.0 mmHg                   (  2.0-4.0  LVPWd: 0.85 cm                           F: (0.6-0.9)/ M: (0.6-1.0)              AVA Vmax): 1.33 cm2  AVA VTI: 1.32  cm2  AV DI (VTI): 0.75  AV DI (vel): 0.75  LV Mass: 104.5 grams                      F: (67-162)/ M: (88-224)  LV Mass Index: 69.7 grams/m2              F: (43-95)/ M: (49-115)                 LVOT diam: 1.50 cm  RWT: 0.38                                                                        LVOT Peak Vel: 90.9 cm/sec  F: (2.7-3.8)/ M: (3.0-4.0)  LAV (MOD-bp): 33.1 ml                    F: (1.5-2.3)/ M: (1.5-2.3)              LVOT Peak PG: 3.3 mmHg  F: (8-24)/ M: (11-31)                   LVOT Mean PG: 1.00 mmHg  F: (1.5-2.3)/ M: (1.5-2.3)              LVOT VTI: 17.9 cm  F: (8-24)/ M: (11-31)  F: (15-27)/ M: (18-32)                  AV VTI: 23.9 cm  RVd_basal: 3.4 cm  F: (8-20)/ M: (10-24)  F: (4.5-11)/ M: (5-12.6)                AV VR: 0.75  F: (1.6-6.4)/ M: (2.0-7.4)  42-56                                   SV(LVOT): 31.6 ml  F: (32-74)/ M: (36-87)                   SI(LVOT): 21.1 ml/m2  F: (8-36)/ M: (10-44)  TAPSE: 2.26 cm                            20.5-27.5  F: (46-106)/ M: (62-150)  F: (14-42)/ M: (21-61)                  MV E Peak Vel: 99.0 cm/sec  MV A Peak Vel: 61.1 cm/sec  EDV (MOD-bp): 84.9 ml                                                            MV Decel time: 0.16 sec  ESV (MOD-bp): 28.2 ml  EF (MOD-bp): 66.8 %                      F: (54-74)/ M: (52-72)  EDV(MOD-sp4): 84.2 ml                                                            Lat Peak E' Vel: 14.0 cm/sec  EDV(MOD-sp2): 83.7 ml                                                            Lat E/E': 7.1  ESV(MOD-sp2): 25.6 ml                                                            Med Peak E' Vel: 12.6 cm/sec  ESV(MOD-sp4): 29.2 ml                                                            Med E/E': 7.9  MV V max: 121.0 cm/sec  LV FS: 30.4 %  AoR Diam: 3.0 cm                         F:: (2.7-3.3)/ M: (3.1-3.7)  F: (2.3-2.9)/ M: (2.6-3.2)              MV VTI: 29.2 cm  F: (2.3-2.9)/ M: (2.6-3.2)              MVA (VTI): 1.08 cm2  MV V max:  121.0 cm/sec  MV dec slope: 639.0 cm/sec2  TV S' Vel: 22.2 cm/sec  TR Vmax: 246.0 cm/sec  TR Peak PG: 24.0 mmHg  IVC Diam: 2.44 cm    ______________________________________________________________________________  Electronically signed by: MD Brayton Caves  07/10/2022, 7: 43 PM     Stress Test:   Last Stress Result   MYOCARDIAL PERFUSION COMPLETE   Result Value Ref Range    Nuc Stress EF 55 %    TID 1.05     END DIASTOLIC INDEX 75.0     END SYSTOLIC INDEX 34.0     EF 59.0     END DIASTOLIC INDEX (NUC) 68.0     END SYSTOLIC INDEX (NUC) 28.0     Narrative      Post-stress ejection fraction was 55%. Stress end diastolic index:   75.0. Stress end systolic index: 34.0.    There is no prior study available for comparison.    Suboptimal evaluation of the inferior wall at stress due to GI activity  Otherwise, there are no reversible perfusion defects seen to suggest   stress-induced ischemia.    Lexiscan Cardiolite stress test.    Reason for test chest  pain shortness of breath   IV Lexiscan 0.4 mg injected per protocol   Heart rate blood pressure response was normal recovery phase was normal  No chest pain no chest tightness   No EKG evidence of ischemia     Cardiolite portion showed an ejection fraction of 55%.  Suboptimal   evaluation of the inferior wall at stress due to GI activity   No reversible perfusion defect seen to suggest stress-induced ischemia     Test conclusion low risk study patient tolerated test well with no   complications.     Last Stress Result   STRESS TEST - ADULT    Narrative    Bruce protocol treadmill stress test   Reason for test chest pain palpitations   Patient was not sure how much she could walk she has Carylon Perches Danlos   syndrome and dysautonomia    Patient was unable to walk she could only walk for about a couple of   minutes and then we had to stop   Optimal heart rate was not achieved.  Suboptimal stress test.      Test conclusion suboptimal stress test.  WOULD RECOMMEND A LEXISCAN STRESS    TEST SINCE PATIENT NOT ABLE TO EXERCISE ON THE TREADMILL.        Cardiac Cath: No results found for this or any previous visit.     Assessment:       ICD-10-CM    1. Chest pain, unspecified type  R07.9 CTA HEART CORONARY     TRANSTHORACIC ECHOCARDIOGRAM - ADULT      2. Ehlers-Danlos syndrome  Q79.60 CTA HEART CORONARY     TRANSTHORACIC ECHOCARDIOGRAM - ADULT         Impression:  1. Chest pain:  Incomplete stress test 03/26/2022 and negative MPI for segmental reversible defect with artifact done September 23, 2022 with left ventricle ejection fraction 55%..    2. Palpitation which has been lately worsening.  Holter monitoring done Jul 10, 2022 essentially unremarkable.  3. Ehlers-Danlos syndrome.  4. History of depression.    5.  Autoimmune enteropathy.    6. Autoimmune autonomic dysfunction.  7. Dysautonomia.  8. History of migraine.    9. Family history of thoracic aortic aneurysm.    Plan:     Orders Placed This Encounter    CTA HEART CORONARY    TRANSTHORACIC ECHOCARDIOGRAM - ADULT      Plan:  1. Patient will benefit from Coronary CTA for re-evaluation of her coronary status in mainly her thoracic aorta family history of thoracic aortic aneurysm and Ehlers-Danlos syndrome..    2. Awaiting pulmonary evaluation.    3. Home medications should be continued for now.    4. Return to cardiac clinic in 3 months sooner if needed.      Thank you for allowing me to participate in the care of your patient. Please feel free to contact me if there are further questions.     Cletus Gash, MD

## 2022-12-02 ENCOUNTER — Ambulatory Visit (INDEPENDENT_AMBULATORY_CARE_PROVIDER_SITE_OTHER): Payer: Self-pay | Admitting: Family

## 2022-12-03 ENCOUNTER — Ambulatory Visit: Payer: Medicaid Other | Attending: Family | Admitting: Family

## 2022-12-03 ENCOUNTER — Encounter (INDEPENDENT_AMBULATORY_CARE_PROVIDER_SITE_OTHER): Payer: Self-pay | Admitting: Family

## 2022-12-03 ENCOUNTER — Other Ambulatory Visit: Payer: Self-pay

## 2022-12-03 VITALS — BP 122/74 | HR 79 | Temp 98.1°F | Resp 20 | Ht 64.0 in | Wt 107.4 lb

## 2022-12-03 DIAGNOSIS — Z681 Body mass index (BMI) 19 or less, adult: Secondary | ICD-10-CM | POA: Insufficient documentation

## 2022-12-03 DIAGNOSIS — Z9049 Acquired absence of other specified parts of digestive tract: Secondary | ICD-10-CM | POA: Insufficient documentation

## 2022-12-03 DIAGNOSIS — K224 Dyskinesia of esophagus: Secondary | ICD-10-CM | POA: Insufficient documentation

## 2022-12-03 DIAGNOSIS — R195 Other fecal abnormalities: Secondary | ICD-10-CM

## 2022-12-03 DIAGNOSIS — R140 Abdominal distension (gaseous): Secondary | ICD-10-CM | POA: Insufficient documentation

## 2022-12-03 DIAGNOSIS — R1310 Dysphagia, unspecified: Secondary | ICD-10-CM | POA: Insufficient documentation

## 2022-12-03 DIAGNOSIS — R197 Diarrhea, unspecified: Secondary | ICD-10-CM | POA: Insufficient documentation

## 2022-12-03 DIAGNOSIS — R110 Nausea: Secondary | ICD-10-CM | POA: Insufficient documentation

## 2022-12-03 DIAGNOSIS — K5989 Other specified functional intestinal disorders: Secondary | ICD-10-CM | POA: Insufficient documentation

## 2022-12-03 DIAGNOSIS — Z79899 Other long term (current) drug therapy: Secondary | ICD-10-CM | POA: Insufficient documentation

## 2022-12-03 DIAGNOSIS — R634 Abnormal weight loss: Secondary | ICD-10-CM | POA: Insufficient documentation

## 2022-12-03 DIAGNOSIS — R1013 Epigastric pain: Secondary | ICD-10-CM

## 2022-12-03 MED ORDER — PANTOPRAZOLE 40 MG TABLET,DELAYED RELEASE
40.0000 mg | DELAYED_RELEASE_TABLET | Freq: Every day | ORAL | 1 refills | Status: DC
Start: 2022-12-03 — End: 2023-01-29

## 2022-12-03 NOTE — Progress Notes (Unsigned)
GASTROENTEROLOGY/HEPATOLOGY, PHYSICIAN OFFICE CENTER  [address]  [place] [address]  Operated by [facility]     Name: Holly Fritz MRN:  [id number]   Date: 12/03/2022 Age: 42 y.o.       Requesting Physician: [place], [place] Taylor, NP  Source: Patient, sister and review of records.     History of Present Illness  Holly Fritz is a 42 y.o. female NPV for abd pain, dysphagia and wt loss.     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  Was enrolled in GI pain program at one point.  Had dietician and TNP 2017-2018.   Onset of symptoms around 2015.   Initially dizziness and presyncopal during meals.     S/P Chole, 2020.  Had relief of GI symptoms for about 1 yr.     Summary of prior testing, reviewed on Pts phone:  11-24-15 NM Esophageal Motility: pos for delayed esophageal emptying, neg for delayed gastric emptying, neg for delayed small bowel emptying, pos for delayed colonic emptying diffusely and in colon and rectosigmoid     Today:   Constant dull, nagging epigastric pain.    Post prandial bloating and fullness.   No specific food triggers.   Hard to tolerate even Ensure.   Reflux and regurgitation few days per week.   No vomiting.     Dysphagia to liquids and solids.  Odynophagia about 1 x per week.   Feels stuck in throat.  Has to wait for it to pass.   ID's rice and meats as common triggers.    Also has chest pain and spasms.     Several stools each morning, consistency varies.  No sensation of incomplete emptying.   No rectal bleeding.   Stools often float.     States previous normal wt 115-120 lbs.  Today, 107 lbs.     Uses THC for abd pain, nausea and diarrhea.   BID ondansetron helps nausea.   BID famotidine helps heartburn.     Reports having EGD with dilation x 2, most recently 2018.     Pt is under a significant amount of stress (mom recently passed, caregiver for g-ma, recently divorced.)  In  grief counseling.  Good support system. GI symptoms preceded stressors.       Past History  Current Outpatient Medications   Medication Sig    aspirin-acetaminophen-caffeine (EXCEDRIN) 250-250-65 mg Oral Tablet Take 2 Tablets by mouth Every 6 hours as needed for Migraine    azithromycin (ZITHROMAX) 250 mg Oral Tablet Take 500 mg (2 tab) on day 1; take 250 mg (1 tab) on days 2-5. (Patient not taking: Reported on 12/03/2022)    capsaicin (ZOSTRIX) 0.025 % Cream by Apply Topically route Twice daily (Patient not taking: Reported on 12/03/2022)    cetirizine (ZYRTEC) 10 mg Oral Tablet Take 1 Tablet (10 mg total) by mouth Once a day    clobetasoL-gauze-silicone 0.05 %- 4" X 4" Apply externally Kit Apply topically    clonazePAM (KLONOPIN) 0.5 mg Oral Tablet Take 1 Tablet (0.5 mg total) by mouth Twice daily    cyclobenzaprine (FLEXERIL) 10 mg Oral Tablet Take 1 Tablet (10 mg total) by mouth Every 8 hours as needed for Muscle spasms    diphenhydrAMINE (BENADRYL) 25 mg Oral Capsule Take 1 Capsule (25 mg total) by mouth Once per day as needed for Itching    famotidine (PEPCID) 10 mg Oral Tablet Take  1 Tablet (10 mg total) by mouth Twice daily    hydrOXYzine HCL (ATARAX) 25 mg Oral Tablet Take 2 Tablets (50 mg total) by mouth Three times a day    Ibuprofen (MOTRIN) 600 mg Oral Tablet Take 1 Tab (600 mg total) by mouth Four times a day as needed for Pain    lamoTRIgine (LAMICTAL) 100 mg Oral Tablet Take 1 Tablet (100 mg total) by mouth Twice daily    lidocaine (LIDODERM) 5 % Adhesive Patch, Medicated APPLY 1 PATCH BY TOPICAL ROUTE ONCE DAILY (MAY WEAR UP TO 12HOURS.)    lidocaine (XYLOCAINE) 5 % Ointment APPLY TO AFFECTED AREA 1-4 TIMES DAILY AS NEEDED    metoprolol tartrate (LOPRESSOR) 50 mg Oral Tablet Take 1 Tablet (50 mg total) by mouth Once a day for 1 dose    ondansetron (ZOFRAN ODT) 4 mg Oral Tablet, Rapid Dissolve Take 1 Tab (4 mg total) by mouth Every 8 hours as needed for Nausea/Vomiting    sodium chloride (SALINE NOSE  NASL) by Nasal route    Tacrolimus 0.1 % Ointment Apply topically Twice daily     Allergies   Allergen Reactions    Adhesive Rash    Doxycycline Rash     Agitation      Propranolol Hives/ Urticaria, Itching and Rash    Ciprofloxacin     Gabapentin  Other Adverse Reaction (Add comment)     Patient reports is "sensitive" to medication.    Levaquin [Levofloxacin]     Oxycodone      Patient reports "sensitivity" to medication.    Penicillins      unknown reaction when an infant - HAS TOLERATED KEFLEX    Tapentadol      Patient reports "sensitivity" to medication.     Past Medical History:   Diagnosis Date    Autoimmune disorder (CMS HCC)     Cervicocranial syndrome     Chiari I malformation (CMS HCC)     Chronic pain     Convergence insufficiency     Dysautonomia (CMS HCC)     Ehlers-Danlos syndrome     Endometriosis     Gastroparesis     Headache     Scoliosis          Past Surgical History:   Procedure Laterality Date    ESOPHAGEAL DILATION      HX LAP CHOLECYSTECTOMY      HX MYOMECTOMY      HX WISDOM TEETH EXTRACTION      POSTERIOR FOSSA DECOMPRESSION      2017         Family History  Family Medical History:       Problem Relation (Age of Onset)    Lymphoma Mother    No Known Problems Father, Sister, Brother, Maternal Grandmother, Maternal Grandfather, Paternal Grandmother, Paternal Grandfather, Daughter, Son, Maternal Aunt, Maternal Uncle, Paternal Aunt, Paternal Uncle, Other            Social History  Social History     Socioeconomic History    Marital status: Married   Tobacco Use    Smoking status: Every Day     Current packs/day: 1.50     Average packs/day: 1.5 packs/day for 20.0 years (30.0 ttl pk-yrs)     Types: Cigarettes    Smokeless tobacco: Never   Vaping Use    Vaping status: Never Used   Substance and Sexual Activity    Alcohol use: Not Currently    Drug use: Yes  Types: Marijuana     Social Determinants of Health      Received from [employer]    Social Connections   Intimate Partner Violence:  Not At Risk (01/10/2022)    Received from [facility], [facility]    Interpersonal Violence     Interpersonal Violence: No    Received from [facility], [facility]    Housing Stability     Review of Systems  General: per history of present illness   Neuro: No confusion, weakness, numbness, or tingling.   HEENT: No HA, visual abnormalities, dysphagia, or odynophagia.   Neck: No lymphadenopathy.    Lungs: No cough, hemoptysis, or difficulty breathing.   Heart: No chest pain or palpitations.   Abd: per history of present illness   GU: No oliguria, frequency, dysuria, hematuria, or pneumaturia.    Ext: No edema or discoloration.   Skin: No rashes, jaundice, pruritus, or bruising.   Psych: No depression, anxiety, or difficulty coping.      Examination    Vitals:    12/03/22 1331   BP: (!) 142/97   Pulse: 79   Resp: 20   Temp: 36.7 C (98.1 F)   TempSrc: Thermal Scan   SpO2: 99%   Weight: 48.7 kg (107 lb 5.8 oz)   Height: 1.626 m (5\' 4" )   BMI: 18.47         Body mass index is 18.43 kg/m.     General: No acute distress.  Nontoxic appearance.    Neuro: Awake, alert, and oriented X 3.    HEENT: Normocephalic, atraumatic. Sclera non-icteric. Conjunctiva pink.     Neck: Supple and without lymphadenopathy.    Lungs: All fields clear to auscultation.  No cough or dyspnea.   Heart: Regular rate and rhythm. No murmur, gallop, click, or rub.   Abd: Soft, nondistended, nontender.  Bowel sounds positive all quadrents.  No organomegaly.   Ext: Without edema and cyanosis.   Skin: Uniformly pink and without rash, jaundice, or bruising.    Psych: Alert and pleasant.  Stable mood.       Impression / Recommendations:  Very pleasant 42 y/o female new to me for abd pain, wt loss and dysphagia.  Prev followed with Dr. Geoffery Spruce, [facility], for Jackhammer Esophagus and Colonic Dysmotility.  Was enrolled in GI pain program at one point.  Had dietician and TNP 2017-2018.     Dysphagia    Wt Loss   Epigastric Abd Pain   - Advised to cut food into small pieces, chew thoroughly, drink liquids with meals, and remain upright during and 30 min after meals .   - Avoid NSAID's   - Begin pantoprazole 40 mg daily, 30 min before breakfast  - BAS to assess for web, ring, stricture    - EGD with dilation   - I discussed procedure with patient in detail, including indications, potential findings, risks, preparation, and after care.  I addressed all procedure-related questions.   - CBC and iron studies to assess for anemia   - CRP to assess for inflammatory component   - Pt inquires about MALS.  May consider imaging, pending results of current testing     Diarrhea   - XR KUB to assess for stool burden   - Fecal elastase to assess for exocrine pancreas insufficiency     Orders Placed This Encounter    XR KUB AND UPRIGHT ABDOMEN    FLUORO ESOPHAGRAM (  BA SWALLOW)    PANCREATIC ELASTASE, FECES    CBC    C-REACTIVE PROTEIN(CRP),INFLAMMATION    IRON TRANSFERRIN AND TIBC    pantoprazole (PROTONIX) 40 mg Oral Tablet, Delayed Release (E.C.)     RTC 1-2 weeks after EGD.     The patient was given ample opportunity to ask questions and those questions were answered to the patient's satisfaction. Explanation and counseling provided specific to medications, labs, imaging, endoscopy, as appropriate per patient.  The patient was encouraged to be involved in their own care.  The patient was told to contact me with any additional questions or concerns.  I instructed the patient to use [facility] for messages, or call the office.   Caralee Ates, APRN,NP-C   12/03/2022, 13:49    Portions of this note may be dictated using MModal Fluency. Parts of this patient's chart may be completed in a retrospective fashion due to simultaneous direct patient care activities.  Variances in spelling and vocabulary are possible and unintentional. Not all errors are caught/corrected. Please notify the There[facility] Parkin if any discrepancies are noted or if  the meaning of any statement is not clear.

## 2022-12-04 ENCOUNTER — Encounter (INDEPENDENT_AMBULATORY_CARE_PROVIDER_SITE_OTHER): Payer: Self-pay | Admitting: Family

## 2022-12-04 ENCOUNTER — Encounter (INDEPENDENT_AMBULATORY_CARE_PROVIDER_SITE_OTHER): Payer: Self-pay

## 2022-12-05 ENCOUNTER — Ambulatory Visit: Payer: Medicaid Other | Attending: Family

## 2022-12-05 ENCOUNTER — Other Ambulatory Visit: Payer: Self-pay

## 2022-12-05 DIAGNOSIS — R634 Abnormal weight loss: Secondary | ICD-10-CM | POA: Insufficient documentation

## 2022-12-05 LAB — CBC
HCT: 36.6 % (ref 34.8–46.0)
HGB: 12.2 g/dL (ref 11.5–16.0)
MCH: 30.6 pg (ref 26.0–32.0)
MCHC: 33.3 g/dL (ref 31.0–35.5)
MCV: 91.7 fL (ref 78.0–100.0)
MPV: 8.7 fL (ref 8.7–12.5)
PLATELETS: 342 10*3/uL (ref 150–400)
RBC: 3.99 10*6/uL (ref 3.85–5.22)
RDW-CV: 13 % (ref 11.5–15.5)
WBC: 7.5 10*3/uL (ref 3.7–11.0)

## 2022-12-05 LAB — C-REACTIVE PROTEIN(CRP),INFLAMMATION: CRP INFLAMMATION: 0.4 mg/L (ref <8.0)

## 2022-12-05 LAB — IRON TRANSFERRIN AND TIBC
IRON (TRANSFERRIN) SATURATION: 12 % — ABNORMAL LOW (ref 15–50)
IRON: 44 ug/dL — ABNORMAL LOW (ref 45–170)
TOTAL IRON BINDING CAPACITY: 357 ug/dL (ref 252–504)
TRANSFERRIN: 255 mg/dL (ref 180–360)

## 2022-12-06 ENCOUNTER — Encounter (HOSPITAL_COMMUNITY): Payer: Self-pay | Admitting: Gastroenterology

## 2022-12-11 ENCOUNTER — Ambulatory Visit (HOSPITAL_COMMUNITY): Payer: Medicaid Other

## 2022-12-11 ENCOUNTER — Ambulatory Visit
Admission: RE | Admit: 2022-12-11 | Discharge: 2022-12-11 | Disposition: A | Discharge status: Home / Self Care | Payer: Medicaid Other | Source: Ambulatory Visit | Attending: Family | Admitting: Family

## 2022-12-11 ENCOUNTER — Other Ambulatory Visit: Payer: Self-pay

## 2022-12-11 DIAGNOSIS — R110 Nausea: Secondary | ICD-10-CM

## 2022-12-19 ENCOUNTER — Other Ambulatory Visit: Payer: Self-pay

## 2022-12-19 ENCOUNTER — Ambulatory Visit
Admission: RE | Admit: 2022-12-19 | Discharge: 2022-12-19 | Disposition: A | Discharge status: Home / Self Care | Payer: Medicaid Other | Source: Ambulatory Visit | Attending: Family | Admitting: Family

## 2022-12-19 DIAGNOSIS — R1310 Dysphagia, unspecified: Secondary | ICD-10-CM | POA: Insufficient documentation

## 2022-12-19 MED ORDER — BARIUM SULFATE 96 % (W/W) ORAL POWDER FOR SUSPENSION
352.0000 g | INHALATION_SUSPENSION | ORAL | Status: AC
Start: 2022-12-19 — End: 2022-12-19
  Administered 2022-12-19: 352 g via ORAL

## 2022-12-25 ENCOUNTER — Encounter (HOSPITAL_COMMUNITY): Payer: Self-pay

## 2022-12-25 ENCOUNTER — Encounter (HOSPITAL_BASED_OUTPATIENT_CLINIC_OR_DEPARTMENT_OTHER): Payer: Self-pay

## 2022-12-26 ENCOUNTER — Encounter (HOSPITAL_BASED_OUTPATIENT_CLINIC_OR_DEPARTMENT_OTHER): Payer: Self-pay

## 2022-12-26 ENCOUNTER — Inpatient Hospital Stay (HOSPITAL_COMMUNITY)
Admission: RE | Admit: 2022-12-26 | Discharge: 2022-12-26 | Disposition: A | Discharge status: Home / Self Care | Payer: Medicaid Other | Source: Ambulatory Visit

## 2022-12-26 ENCOUNTER — Encounter (HOSPITAL_COMMUNITY): Payer: Self-pay

## 2022-12-26 HISTORY — DX: Shortness of breath: R06.02

## 2022-12-26 HISTORY — DX: Postural orthostatic tachycardia syndrome (POTS): G90.A

## 2022-12-26 HISTORY — DX: Unintended awareness under general anesthesia during procedure, initial encounter: T88.53XA

## 2022-12-26 HISTORY — DX: Personal history of other specified conditions: Z87.898

## 2022-12-26 NOTE — Nurses Notes (Signed)
[facility] MEDICINE    Preoperative Evaluation Center   Department of Anesthesiology      Name: Holly Fritz, Holly Fritz   DOB: 11/10/1980    PRE-PROCEDURE/OPERATIVE INSTRUCTIONS-ADULT GI  Preoperative Evaluation Center [facility])  [address], [place], [place] [address]    Thank you for choosing [facility] for your health care needs. [facility] is a tobacco-free campus. Please refrain from using any forms of tobacco while on the premises.     Please review upon receipt and again prior to procedure date.    If, after your PEC call or visit, you experience any of the changes below or develop any of the listed symptoms, Call the Preoperative Evaluation Center [facility]) at [phone].   Change in your best contact phone number.  Changes in your medications, especially starting a new medication.  Changes in your medical history, including an Emergency room visit, a hospital admission, or you receive a new diagnosis.    Develop any of these symptoms:  fever, cough, sore throat, shortness of breath, chills, muscle pain, new loss of taste or smell, vomiting or diarrhea, or fatigue (extreme tiredness or lack of energy).  Diagnosed with conjunctivitis (pink eye) or shingles.  Diagnosed as COVID positive.  Develop a wound, rash, open area or sores.  If you use or are prescribed an antibiotic.     Also contact your PCP to discuss your symptom(s) and the potential need for treatment/ appointments/etc.     PROCEDURE:    Procedure(s):  GASTROSCOPY    DATE of SURGERY:   January 17, 2023    ARRIVAL LOCATION:   1st Floor Registration     Arrival time and diet instructions will be given the business day prior to your procedure between 1030 am to 1 pm. If you should not hear from anyone by 2 pm the business day prior to your procedure, please call [phone] (Option 3).    Patients and visitors may park for free in the lots in front of the hospital. If you are need assistance from [place] parking lot, we have transportation available to you.  Call  security at [phone] to arrange pick up from the parking lot.    DIET INSTRUCTIONS:   STOP regular diet 8 hours before the arrival time of the surgery/procedure.   STOP clear liquids 2 hours before the arrival time of surgery/procedure  then NPO (NPO means NO FOOD or DRINK).    ACCEPTABLE CLEAR LIQUIDS: Water, fruit juices (white grape or apple), pedialyte, gatorade, contrast dye (limited to non-particulate forms, such as gastrografin), coffee & tea without fat/milk/creamer, and clear broth without fat or protein.     LIQUIDS NOT ACCEPTED: Orange juice, any product colored red/blue/purple, coffee or tea with fat/milk/creamer, or any alcoholic beverages.       Your diet instructions are specific for your safety. When not followed, particles left in your stomach could enter your lungs during surgery. Failure to follow the instructions could result in a delay or cancellation of your surgery.          and Special Circumstances Diet Instruction:  Drink 20oz electrolyte beverage up to 2 hours before scheduled surgery time. The electrolyte beverage must be completed by this time.(Gatorade, Powerade, or Pedialyte, any flavor but exclude colors red/blue/purple. Diabetic's drink same products but with zero sugar.)    No tobacco (cigarettes, vaping, snuff, or chewing tobacco) or medical marijuana (all types) 8 hours prior to scheduled arrival time.  No Alcohol, or recreational drugs, including marijuana, 24  hours prior to scheduled arrival time.  No Chewing gum is permitted.    MEDICATION INSTRUCTIONS:    Meds to take day of procedure with a sip of water: zyrtec, klonopin, pepcid, hydoxyzine, lamictal, protonix     Avoid taking:?Iron and fiber supplements for 5 days prior to your procedure.        PREP:   Procedure prep instructions will come from the GI Office.    TRANSPORTATION:  You must arrange for a responsible person, 18 or older, to drive you home and stay for 24 hours following discharge after the procedure. Public  transportation may only be used in the event you still have the responsible person with you, drivers of the transportation are not responsible for your care.  If you have not arranged for a driver and responsible person to accompany you, your procedure will be cancelled.      If you use home oxygen, bring enough for travel to hospital and return trip home.     OTHER IMPORTANT INFORMATION:  Do not bring money, jewelry, or valuables with you.    Do not wear make-up, nail polish, lotion, powder, or aftershave. Do not wear any jewelry, watches, earrings, rings, or body piercings.  Piercings, including silicone, will be required to be removed in Pre-op area prior to surgery.  Remove your continuous glucose monitoring system prior to arrival as staff are required to check your glucose using facility monitors. Feel free to bring your device with you as you can reapply it after surgery, upon discharge.    Transport planner for implantable devices (Deep Brain Stimulator or Nerve Stimulator for pain/mental health/sleep apnea, etc.) as they may be required to be turned off.  No visitors under age 42 are permitted in the preparation/recovery areas. It is strongly suggested that child-care arrangements be made.   Wear glasses instead of contact lenses. If possible, bring a case for your glasses.  It may be possible to wear your hearing aid throughout the procedure. Discuss this with the Pre op nursing staff.  Wear comfortable shoes and clean clothes to the hospital as you will wear them home after the procedure.  Bring any equipment - crutches, splints, etc., that you are already using at home.  Bring any legal paperwork with you (guardianship, custody, Medical Power of Attorney, Designer, industrial/product, etc) if applicable.  Before using the bathroom at the hospital, check with hospital staff for needed specimens.    LODGING:    The [facility] provide "homes away from home" for families of patients. If you, the patient,  plan to stay, a responsible person 82 or older, is required to stay with you for 24 hours following discharge, the same as if you are going home after discharge from facility. The [facility], located across the parking lot from the hospital provides lodging and supportive services to adult patients and their families. To be eligible, guests must live 50 miles or more from the [place] area and request a referral from a hospital staff person to be placed on the waiting list. The [facility] staff and volunteers can assist you with hotel reservations, usually at a discounted rate, until a room becomes available. [phone].    For more information on local motels and a list of private homes providing rental rooms, contact Social Services at [phone].    SPIRITUAL CARE:  [facility] has chaplains available every day for your spiritual needs. Our Interfaith Prayer and Meditation Room is located at [facility] on the first floor between the [employer] and elevators. You may ask your nurse or staff member to contact the on-call chaplain anytime.    For questions regarding instructions please contact PEC at [phone]. Leave a message if no answer.      CANCEL/PROCEDURE/SURGERY:   If you must cancel your procedure, call [phone] between 8am and 4:30pm.  After 4:30pm, call the [facility] Healthline at [phone] and ask for the GI Fellow on call.

## 2022-12-31 LAB — PANCREATIC ELASTASE, FECES: PANCREATIC ELASTASE-1: >800 ug/g (ref >200)

## 2023-01-07 ENCOUNTER — Ambulatory Visit (HOSPITAL_COMMUNITY): Payer: Self-pay

## 2023-01-07 ENCOUNTER — Ambulatory Visit (RURAL_HEALTH_CENTER): Payer: Self-pay | Admitting: NURSE PRACTITIONER

## 2023-01-16 ENCOUNTER — Ambulatory Visit (RURAL_HEALTH_CENTER): Payer: Self-pay | Admitting: NURSE PRACTITIONER

## 2023-01-17 ENCOUNTER — Ambulatory Visit (HOSPITAL_COMMUNITY): Payer: Medicaid Other | Admitting: ANESTHESIOLOGY

## 2023-01-17 ENCOUNTER — Ambulatory Visit
Admission: RE | Admit: 2023-01-17 | Discharge: 2023-01-17 | Disposition: A | Discharge status: Home / Self Care | Payer: Medicaid Other | Source: Ambulatory Visit | Attending: Gastroenterology | Admitting: Gastroenterology

## 2023-01-17 ENCOUNTER — Encounter (HOSPITAL_COMMUNITY): Payer: Self-pay | Admitting: Gastroenterology

## 2023-01-17 ENCOUNTER — Ambulatory Visit (HOSPITAL_COMMUNITY): Payer: Medicaid Other | Admitting: Gastroenterology

## 2023-01-17 ENCOUNTER — Other Ambulatory Visit: Payer: Self-pay

## 2023-01-17 ENCOUNTER — Ambulatory Visit (HOSPITAL_BASED_OUTPATIENT_CLINIC_OR_DEPARTMENT_OTHER): Payer: Medicaid Other | Admitting: ANESTHESIOLOGY

## 2023-01-17 ENCOUNTER — Encounter (HOSPITAL_COMMUNITY)
Admission: RE | Disposition: A | Discharge status: Home / Self Care | Payer: Self-pay | Source: Ambulatory Visit | Attending: Gastroenterology

## 2023-01-17 DIAGNOSIS — F17210 Nicotine dependence, cigarettes, uncomplicated: Secondary | ICD-10-CM | POA: Insufficient documentation

## 2023-01-17 DIAGNOSIS — R1310 Dysphagia, unspecified: Secondary | ICD-10-CM | POA: Insufficient documentation

## 2023-01-17 DIAGNOSIS — R519 Headache, unspecified: Secondary | ICD-10-CM | POA: Insufficient documentation

## 2023-01-17 LAB — HCG, SERUM QUALITATIVE, PREGNANCY: PREGNANCY, SERUM QUALITATIVE: NEGATIVE

## 2023-01-17 SURGERY — GASTROSCOPY WITH DILATION
Anesthesia: Monitor Anesthesia Care | Site: Mouth | Wound class: Clean Contaminated Wounds-The respiratory, GI, Genital, or urinary

## 2023-01-17 MED ORDER — SODIUM CHLORIDE 0.9 % (FLUSH) INJECTION SYRINGE
2.0000 mL | INJECTION | INTRAMUSCULAR | Status: DC | PRN
Start: 2023-01-17 — End: 2023-01-17

## 2023-01-17 MED ORDER — PROPOFOL 10 MG/ML IV BOLUS
INJECTION | Freq: Once | INTRAVENOUS | Status: DC | PRN
Start: 2023-01-17 — End: 2023-01-17
  Administered 2023-01-17: 80 mg via INTRAVENOUS

## 2023-01-17 MED ORDER — LIDOCAINE (PF) 100 MG/5 ML (2 %) INTRAVENOUS SYRINGE
INJECTION | Freq: Once | INTRAVENOUS | Status: DC | PRN
Start: 2023-01-17 — End: 2023-01-17
  Administered 2023-01-17 (×2): 50 mg via INTRAVENOUS

## 2023-01-17 MED ORDER — LACTATED RINGERS INTRAVENOUS SOLUTION
INTRAVENOUS | Status: DC
Start: 2023-01-17 — End: 2023-01-17

## 2023-01-17 MED ORDER — SODIUM CHLORIDE 0.9 % INTRAVENOUS SOLUTION
INTRAVENOUS | Status: DC | PRN
Start: 2023-01-17 — End: 2023-01-17

## 2023-01-17 MED ORDER — SODIUM CHLORIDE 0.9 % (FLUSH) INJECTION SYRINGE
2.0000 mL | INJECTION | Freq: Three times a day (TID) | INTRAMUSCULAR | Status: DC
Start: 2023-01-17 — End: 2023-01-17

## 2023-01-17 MED ORDER — SODIUM CHLORIDE 0.9% FLUSH BAG - 250 ML
INTRAVENOUS | Status: DC | PRN
Start: 2023-01-17 — End: 2023-01-17

## 2023-01-17 MED ORDER — PROPOFOL 10 MG/ML INTRAVENOUS EMULSION
INTRAVENOUS | Status: DC | PRN
Start: 2023-01-17 — End: 2023-01-17
  Administered 2023-01-17: 0 ug/kg/min via INTRAVENOUS
  Administered 2023-01-17: 400 ug/kg/min via INTRAVENOUS
  Administered 2023-01-17 (×2): 300 ug/kg/min via INTRAVENOUS

## 2023-01-17 SURGICAL SUPPLY — 3 items
FORCEPS BIOPSY NEEDLE 240CM 2.2MM RJ 4 2.8MM STD CPC STRL DISP ORNG (ENDOSCOPIC SUPPLIES) ×1 IMPLANT
GW ENDOS 210CM SAFEGUIDE MRK SPRG TIP XTD COR REM END CAP SS ESOPH DIL NONST LF  DISP (ENDOSCOPIC SUPPLIES) ×1 IMPLANT
KIT ENDOSCOPIC COMPLIANCE ENDOKIT ORCAPOD 4 1.1OZ 1.1OZ CLEAN ADAPTER (ENDOSCOPIC SUPPLIES) ×1 IMPLANT

## 2023-01-17 NOTE — Anesthesia Preprocedure Evaluation (Signed)
ANESTHESIA PRE-OP EVALUATION  Planned Procedure: GASTROSCOPY (Mouth)  Review of Systems     anesthesia history negative     patient summary reviewed  nursing notes reviewed        Pulmonary   past history of smoking  and Smoked in last 24 hours,   Cardiovascular  negative cardio ROS,  NYHA Classification:I  ECG reviewed ,No peripheral edema,  Exercise Tolerance: <4 METS        GI/Hepatic/Renal   negative GI/hepatic/renal ROS,         Endo/Other   neg endo/other ROS,       Neuro/Psych/MS    headaches, Substance use, marijuana     Cancer    negative hematology/oncology ROS,                     Physical Assessment      Airway       Mallampati: II    TM distance: >3 FB    Neck ROM: full  Mouth Opening: good.  No Facial hair  No Beard  No endotracheal tube present  No Tracheostomy present    Dental       Dentition intact             Pulmonary    Breath sounds clear to auscultation  (-) no rhonchi, no decreased breath sounds, no wheezes, no rales and no stridor     Cardiovascular    Rhythm: regular  Rate: Normal  (-) no friction rub, carotid bruit is not present, no peripheral edema and no murmur     Other findings              Plan  ASA 2     Planned anesthesia type: MAC             POV PLAN:   plan for postoperative opioid use            Intravenous induction     Anesthesia issues/risks discussed are: Dental Injuries, PONV, Post-op Cognitive Dysfunction, Post-op Agitation/Tantrum, Cardiac Events/MI, Aspiration and Sore Throat.  Anesthetic plan and risks discussed with patient       Use of blood products discussed with patient who.      Patient's NPO status is appropriate for Anesthesia.           Plan discussed with CRNA.

## 2023-01-17 NOTE — Discharge Instructions (Addendum)
SURGICAL DISCHARGE INSTRUCTIONS     Dr. Gayam, Swapna, MD  performed your GASTROSCOPY today at the [facility]    [facility]:  Monday through Friday from 6 a.m. - 7 p.m.: [phone]  Between 7 p.m. - 6 a.m., weekends and holidays:  Call Healthline at [phone] or [phone].    PLEASE SEE WRITTEN HANDOUTS AS DISCUSSED BY YOUR NURSE:      SIGNS AND SYMPTOMS OF A WOUND / INCISION INFECTION   Be sure to watch for the following:  Increase in redness or red streaks near or around the wound or incision.  Increase in pain that is intense or severe and cannot be relieved by the pain medication that your doctor has given you.  Increase in swelling that cannot be relieved by elevation of a body part, or by applying ice, if permitted.  Increase in drainage, or if yellow / green in color and smells bad. This could be on a dressing or a cast.  Increase in fever for longer than 24 hours, or an increase that is higher than 101 degrees Fahrenheit (normal body temperature is 98 degrees Fahrenheit). The incision may feel warm to the touch.    **CALL YOUR DOCTOR IF ONE OR MORE OF THESE SIGNS / SYMPTOMS SHOULD OCCUR.    ANESTHESIA INFORMATION   ANESTHESIA -- ADULT PATIENTS:  You have received intravenous sedation / general anesthesia, and you may feel drowsy and light-headed for several hours. You may even experience some forgetfulness of the procedure. DO NOT DRIVE A MOTOR VEHICLE or perform any activity requiring complete alertness or coordination until you feel fully awake in about 24-48 hours. Do not drink alcoholic beverages for at least 24 hours. Do not stay alone, you must have a responsible adult available to be with you. You may also experience a dry mouth or nausea for 24 hours. This is a normal side effect and will disappear as the effects of the medication wear off.    REMEMBER   If you experience any difficulty breathing, chest pain, bleeding that you feel is excessive, persistent  nausea or vomiting or for any other concerns:  Call your physician Dr. GAYAM at [phone] or [phone]. You may also ask to have the GI doctor on call paged. They are available to you 24 hours a day.    SPECIAL INSTRUCTIONS / COMMENTS       FOLLOW-UP APPOINTMENTS   Please call patient services at [phone] or [phone] to schedule a date / time of return. They are open Monday - Friday from 7:30 am - 5:00 pm.

## 2023-01-17 NOTE — Anesthesia Postprocedure Evaluation (Signed)
Anesthesia Post Op Evaluation    Patient: Holly Fritz  Procedure(s) with comments:  GASTROSCOPY WITH DILATION - 10/22 lmm. ba    Last Vitals:Temperature: 36.4 C (97.5 F) (01/17/23 1535)  Heart Rate: 76 (01/17/23 1535)  BP (Non-Invasive): 118/76 (01/17/23 1535)  Respiratory Rate: 18 (01/17/23 1535)  SpO2: 100 % (01/17/23 1535)    No notable events documented.    Patient is sufficiently recovered from the effects of anesthesia to participate in the evaluation and has returned to their pre-procedure level.  Patient location during evaluation: PACU       Patient participation: complete - patient participated  Level of consciousness: awake and alert and responsive to verbal stimuli    Pain management: adequate  Airway patency: patent    Anesthetic complications: no  Cardiovascular status: acceptable  Respiratory status: acceptable  Hydration status: acceptable  Patient post-procedure temperature: Pt Normothermic   PONV Status: Absent

## 2023-01-17 NOTE — H&P (Signed)
[facility]  GI Admission History and Physical      Kande, Dillahunt   MRN:  [id number]  Date of Birth:  Dec 20, 1980    Date of Procedure:  01/17/2023    Chief Complaint: Dysphagia and abdominal pain.     HPI: Novell, Kelling is a 42 y.o. year old female who presents today for EGD.    This procedure is being done to evaluate Dysphagia.    The patient denies abdominal pain, hematochezia, or melena.    Past Medical History:   Diagnosis Date    Autoimmune disorder (CMS HCC)     Awareness under anesthesia     Cervicocranial syndrome     Chiari I malformation (CMS HCC)     Chronic pain     Convergence insufficiency     Dysautonomia (CMS HCC)     Ehlers-Danlos syndrome     Endometriosis     Headache     History of anesthesia complications     patient states make sure you give me enough medication to go to sleep and stay asleep or I will wake up during procedure    POTS (postural orthostatic tachycardia syndrome)     Scoliosis     Shortness of breath            Allergies   Allergen Reactions    Adhesive Rash    Doxycycline Rash     Agitation      Propranolol Hives/ Urticaria, Itching and Rash    Ciprofloxacin     Gabapentin  Other Adverse Reaction (Add comment)     Patient reports is "sensitive" to medication.    Levaquin [Levofloxacin]     Oxycodone      Patient reports "sensitivity" to medication.    Penicillins      unknown reaction when an infant - HAS TOLERATED KEFLEX    Tapentadol      Patient reports "sensitivity" to medication.       Medications Prior to Admission       Prescriptions    aspirin-acetaminophen-caffeine (EXCEDRIN) 250-250-65 mg Oral Tablet    Take 2 Tablets by mouth Every 6 hours as needed for Migraine    capsaicin (ZOSTRIX) 0.025 % Cream    by Apply Topically route Twice daily    Patient not taking:  Reported on 12/03/2022    cetirizine (ZYRTEC) 10 mg Oral Tablet    Take 1 Tablet (10 mg total) by mouth Twice daily    clobetasoL-gauze-silicone 0.05 %- 4" X 4" Apply externally Kit    Apply  topically    clonazePAM (KLONOPIN) 0.5 mg Oral Tablet    Take 1 Tablet (0.5 mg total) by mouth Twice daily    cyclobenzaprine (FLEXERIL) 10 mg Oral Tablet    Take 1 Tablet (10 mg total) by mouth Every 8 hours as needed for Muscle spasms    diphenhydrAMINE (BENADRYL) 25 mg Oral Capsule    Take 1 Capsule (25 mg total) by mouth Once per day as needed for Itching    famotidine (PEPCID) 10 mg Oral Tablet    Take 1 Tablet (10 mg total) by mouth Twice daily    hydrOXYzine HCL (ATARAX) 25 mg Oral Tablet    Take 2 Tablets (50 mg total) by mouth Three times a day    Ibuprofen (MOTRIN) 600 mg Oral Tablet    Take 1 Tab (600 mg total) by mouth Four times a day as needed for Pain    lamoTRIgine (LAMICTAL) 100 mg  Oral Tablet    Take 1 Tablet (100 mg total) by mouth Twice daily    lidocaine (LIDODERM) 5 % Adhesive Patch, Medicated    APPLY 1 PATCH BY TOPICAL ROUTE ONCE DAILY (MAY WEAR UP TO 12HOURS.)    lidocaine (XYLOCAINE) 5 % Ointment    APPLY TO AFFECTED AREA 1-4 TIMES DAILY AS NEEDED    ondansetron (ZOFRAN ODT) 4 mg Oral Tablet, Rapid Dissolve    Take 1 Tab (4 mg total) by mouth Every 8 hours as needed for Nausea/Vomiting    pantoprazole (PROTONIX) 40 mg Oral Tablet, Delayed Release (E.C.)    Take 1 Tablet (40 mg total) by mouth Once a day 30 min before breakfast    sodium chloride (SALINE NOSE NASL)    by Nasal route    Tacrolimus 0.1 % Ointment    Apply topically Twice daily    traZODone (DESYREL) 50 mg Oral Tablet    Take 1 Tablet (50 mg total) by mouth Every night as needed for Insomnia    TURMERIC ORAL    Take by mouth Once a day             Past Surgical History:   Procedure Laterality Date    ESOPHAGEAL DILATION      HX LAP CHOLECYSTECTOMY      HX MYOMECTOMY      HX WISDOM TEETH EXTRACTION      POSTERIOR FOSSA DECOMPRESSION      2017           Physical Exam:  There were no vitals filed for this visit.  HEENT: Atraumatic, normocephalic, moist mucous membranes  Heart: Regular rate and rhythm  Lungs: No respiratory  distress, symmetric chest expansion  Abdomen: Soft, nondistended    Assessment:  Dysphagia    Plan:  Proceed with EGD. Pt denies being on AC/AP.    Orders Placed This Encounter    HCG, SERUM QUALITATIVE, PREGNANCY    POCT URINE HCG    REMOVE SALINE LOCK    INSERT & MAINTAIN PERIPHERAL IV ACCESS    PERIPHERAL IV DRESSING CHANGE    NS flush syringe    NS flush syringe    NS 250 mL flush bag    LR premix infusion         Rehmat Mila Merry, MD  GI Fellow    To comply with the [place] [employer] and Accreditation's mandate for medication reconciliation, the process of identifying the most accurate list of all medications the patient is taking was done by nursing staff through patient's self reported medication usage. While I attest to the medication reconciliation being done, my acknowledgement doesn't in anyway convey or affirm that I am the prescriber of these medications and can't attest to the safety, efficacy or interactions of these medications, and that responsibility rests solely with the prescribing physician.    Lovie Chol, MD

## 2023-01-17 NOTE — Anesthesia Transfer of Care (Signed)
ANESTHESIA TRANSFER OF CARE   Holly Fritz is a 42 y.o. ,female, Weight: 49.4 kg (108 lb 14.5 oz)   had Procedure(s) with comments:  GASTROSCOPY WITH DILATION - 10/22 lmm. ba  performed  01/17/23   Primary Service: Lovie Chol, MD    Past Medical History:   Diagnosis Date    Autoimmune disorder (CMS HCC)     Awareness under anesthesia     Cervicocranial syndrome     Chiari I malformation (CMS HCC)     Chronic pain     Convergence insufficiency     Dysautonomia (CMS HCC)     Ehlers-Danlos syndrome     Endometriosis     Headache     History of anesthesia complications     patient states make sure you give me enough medication to go to sleep and stay asleep or I will wake up during procedure    POTS (postural orthostatic tachycardia syndrome)     Scoliosis     Shortness of breath       Allergy History as of 01/17/23       PENICILLINS         Noted Status Severity Type Reaction    05/05/18 0730 Adonis Brook, RN 06/27/17 Active       Comments: unknown reaction when an infant - HAS TOLERATED KEFLEX     05/05/18 0730 Adonis Brook, RN 06/27/17 Active       Comments: unknown reaction when an infant     06/27/17 Tilda Franco, RN 06/27/17 Active                 DOXYCYCLINE         Noted Status Severity Type Reaction    05/05/18 0728 Adonis Brook, RN 06/27/17 Active Medium  Rash    Comments: Agitation       06/27/17 1720 Bevely Palmer, RN 06/27/17 Active                 ADHESIVE         Noted Status Severity Type Reaction    08/26/17 2119 Becky Augusta, RN STUDENT 04/26/15 Active Medium  Rash              GABAPENTIN         Noted Status Severity Type Reaction    05/03/18 1602 Dub Mikes, RN 05/03/18 Active    Other Adverse Reaction (Add comment)    Comments: Patient reports is "sensitive" to medication.               OXYCODONE         Noted Status Severity Type Reaction    05/03/18 1604 Dub Mikes, [place] 05/03/18 Active       Comments: Patient reports "sensitivity" to medication.               TAPENTADOL          Noted Status Severity Type Reaction    05/03/18 1604 Dub Mikes, [place] 05/03/18 Active       Comments: Patient reports "sensitivity" to medication.               LEVOFLOXACIN         Noted Status Severity Type Reaction    03/02/22 1637 Kathie Rhodes, RT (R) 03/02/22 Active                 CIPROFLOXACIN         Noted Status Severity Type Reaction    03/02/22 1637  Kathie Rhodes, RT (R) 03/02/22 Active                 PROPRANOLOL         Noted Status Severity Type Reaction    03/02/22 1644 Pablo Ledger, [place] 08/25/21 Active Medium  Hives/ Urticaria, Itching, Rash                  I completed my transfer of care / handoff to the receiving personnel during which we discussed:  All key/critical aspects of case discussed and Gave opportunity for questions and acknowledgement of understanding      Post Location: Phase II                        Additional Info:Report given to RN                                     Last OR Temp: Temperature: 36.7 C (98.1 F)  ABG:  POTASSIUM   Date Value Ref Range Status   09/11/2018 3.8 3.5 - 5.1 mmol/L Final     KETONES   Date Value Ref Range Status   05/08/2018 Negative Negative mg/dL Final     CALCIUM   Date Value Ref Range Status   09/11/2018 9.5 8.5 - 10.4 mg/dL Final     Calculated P Axis   Date Value Ref Range Status   05/31/2022 76 degrees Final     Calculated R Axis   Date Value Ref Range Status   05/31/2022 82 degrees Final     Calculated T Axis   Date Value Ref Range Status   05/31/2022 33 degrees Final     Airway:* No LDAs found *  Blood pressure 121/82, pulse 80, temperature 36.7 C (98.1 F), resp. rate (!) 21, height 1.626 m (5\' 4" ), weight 49.4 kg (108 lb 14.5 oz), last menstrual period 12/20/2022, SpO2 100%, not currently breastfeeding.

## 2023-01-18 NOTE — Telephone Encounter (Signed)
Paged by MARS line. Patient underwent EGD with dilation on 12/6. Notes severe chest pain which exacerbates on PO intake associated with shortness of breath. Advised patient to proceed to closest ER so that chest imaging can be obtained to ensure no complications occurred from dilation. Patient agreeable with plan.    Hayden Rasmussen, M.D.  Gastroenterology & Hepatology Fellow  01/18/2023 - 12:59

## 2023-01-20 ENCOUNTER — Ambulatory Visit
Admission: RE | Admit: 2023-01-20 | Discharge: 2023-01-20 | Disposition: A | Discharge status: Home / Self Care | Payer: Medicaid Other | Source: Ambulatory Visit | Attending: Cardiovascular Disease | Admitting: Cardiovascular Disease

## 2023-01-20 ENCOUNTER — Other Ambulatory Visit: Payer: Self-pay

## 2023-01-20 ENCOUNTER — Ambulatory Visit (HOSPITAL_BASED_OUTPATIENT_CLINIC_OR_DEPARTMENT_OTHER)
Admission: RE | Admit: 2023-01-20 | Discharge: 2023-01-20 | Disposition: A | Discharge status: Home / Self Care | Payer: Medicaid Other | Source: Ambulatory Visit

## 2023-01-20 DIAGNOSIS — R079 Chest pain, unspecified: Secondary | ICD-10-CM | POA: Insufficient documentation

## 2023-01-20 DIAGNOSIS — Q796 Ehlers-Danlos syndrome, unspecified: Secondary | ICD-10-CM

## 2023-01-20 DIAGNOSIS — R1310 Dysphagia, unspecified: Secondary | ICD-10-CM

## 2023-01-20 DIAGNOSIS — I3139 Other pericardial effusion (noninflammatory): Secondary | ICD-10-CM

## 2023-01-20 DIAGNOSIS — K219 Gastro-esophageal reflux disease without esophagitis: Secondary | ICD-10-CM

## 2023-01-20 LAB — SURGICAL PATHOLOGY SPECIMEN

## 2023-01-20 LAB — TRANSTHORACIC ECHOCARDIOGRAM - ADULT
AV LVOT peak gradient: 5 mm[Hg]
AV mean gradient: 3 mm[Hg]
Ao VTI: 25.4 cm
Ao peak vel: 124 cm/s
Ao root annulus: 2.6 cm
Ao root annulus: 2.6 cm
Aortic Valve Area by Continuity of Peak Velocity: 1.86 cm2
Aortic Valve Area by Continuity of VTI: 1.88 cm2
E wave decelartion time: 215 ms
E/A ratio: 1.6
E/E' ratio: 4.8
Inferior Vena Cava Diameter: 2.3 cm
Inferior Vena Cava Diameter: 2.3 cm
LA volume: 20.6 cm3
LVOT diameter: 1.6 cm
LVOT stroke volume: 48 cm3
Left Ventricular Outflow Tract Peak Velocity: 115 cm/s
MV Peak A Vel: 58.7 cm/s
MV Peak E Vel: 92.1 cm/s
Pulmonic Valve Acceleration Time: 158 ms
RVDD: 2.8 cm
RVDD: 2.8 cm
TDI: 19 cm/s
TDI: 19 cm/s

## 2023-01-20 MED ORDER — IOPAMIDOL 370 MG IODINE/ML (76 %) INTRAVENOUS SOLUTION
65.0000 mL | INTRAVENOUS | Status: AC
Start: 2023-01-20 — End: 2023-01-20
  Administered 2023-01-20: 65 mL via INTRAVENOUS

## 2023-01-20 MED ORDER — NITROGLYCERIN 0.4 MG SUBLINGUAL TABLET
0.4000 mg | SUBLINGUAL_TABLET | SUBLINGUAL | Status: AC
Start: 2023-01-20 — End: 2023-01-20
  Administered 2023-01-20: 0.4 mg via SUBLINGUAL

## 2023-01-20 NOTE — Nurses Notes (Signed)
Pt completed CTA, tolerated well. VSS throughout scan, refer to flowsheet. Patient denies dizziness or vertigo at this time. Patient denies recent use of Cocaine, Viagra, Cialis and Levitra. Pt received 0.4mg  SL nitro tablet. HR at scan was 66.82. Pt ambulatory at discharge, orthostatic BPs obtained (see flowsheets) and IV removed intact.

## 2023-01-21 ENCOUNTER — Ambulatory Visit (HOSPITAL_BASED_OUTPATIENT_CLINIC_OR_DEPARTMENT_OTHER): Payer: Self-pay | Admitting: Internal Medicine

## 2023-01-21 DIAGNOSIS — R079 Chest pain, unspecified: Secondary | ICD-10-CM

## 2023-01-21 DIAGNOSIS — Q796 Ehlers-Danlos syndrome, unspecified: Secondary | ICD-10-CM

## 2023-01-29 ENCOUNTER — Other Ambulatory Visit (INDEPENDENT_AMBULATORY_CARE_PROVIDER_SITE_OTHER): Payer: Self-pay | Admitting: Family

## 2023-01-29 ENCOUNTER — Encounter (INDEPENDENT_AMBULATORY_CARE_PROVIDER_SITE_OTHER): Payer: Self-pay | Admitting: Family

## 2023-01-29 DIAGNOSIS — K224 Dyskinesia of esophagus: Secondary | ICD-10-CM

## 2023-01-29 MED ORDER — PANTOPRAZOLE 40 MG TABLET,DELAYED RELEASE
40.0000 mg | DELAYED_RELEASE_TABLET | Freq: Two times a day (BID) | ORAL | 0 refills | Status: DC
Start: 2023-01-29 — End: 2023-02-24

## 2023-02-10 ENCOUNTER — Other Ambulatory Visit (HOSPITAL_COMMUNITY): Payer: Self-pay | Admitting: PHYSICAL MEDICINE AND REHABILITATION

## 2023-02-10 ENCOUNTER — Other Ambulatory Visit: Payer: Self-pay

## 2023-02-10 ENCOUNTER — Inpatient Hospital Stay
Admission: RE | Admit: 2023-02-10 | Discharge: 2023-02-10 | Disposition: A | Discharge status: Home / Self Care | Payer: 59 | Source: Ambulatory Visit | Attending: PHYSICAL MEDICINE AND REHABILITATION | Admitting: Radiology

## 2023-02-10 DIAGNOSIS — M542 Cervicalgia: Secondary | ICD-10-CM | POA: Insufficient documentation

## 2023-02-24 ENCOUNTER — Encounter (INDEPENDENT_AMBULATORY_CARE_PROVIDER_SITE_OTHER): Payer: Self-pay | Admitting: Family

## 2023-02-24 DIAGNOSIS — K224 Dyskinesia of esophagus: Secondary | ICD-10-CM

## 2023-02-24 MED ORDER — PANTOPRAZOLE 40 MG TABLET,DELAYED RELEASE
40.0000 mg | DELAYED_RELEASE_TABLET | Freq: Two times a day (BID) | ORAL | 0 refills | Status: DC
Start: 2023-02-24 — End: 2023-05-01

## 2023-03-27 ENCOUNTER — Ambulatory Visit (RURAL_HEALTH_CENTER): Payer: Self-pay | Admitting: NURSE PRACTITIONER

## 2023-03-28 ENCOUNTER — Ambulatory Visit (INDEPENDENT_AMBULATORY_CARE_PROVIDER_SITE_OTHER): Payer: Self-pay | Admitting: Family

## 2023-04-02 ENCOUNTER — Other Ambulatory Visit: Payer: Self-pay

## 2023-04-02 ENCOUNTER — Ambulatory Visit (HOSPITAL_COMMUNITY): Payer: Self-pay

## 2023-04-02 ENCOUNTER — Ambulatory Visit (INDEPENDENT_AMBULATORY_CARE_PROVIDER_SITE_OTHER): Payer: Medicaid Other | Admitting: Cardiovascular Disease

## 2023-04-02 ENCOUNTER — Encounter (INDEPENDENT_AMBULATORY_CARE_PROVIDER_SITE_OTHER): Payer: Self-pay | Admitting: Cardiovascular Disease

## 2023-04-02 VITALS — BP 103/70 | HR 78 | Temp 97.0°F | Ht 64.0 in | Wt 102.2 lb

## 2023-04-02 DIAGNOSIS — R079 Chest pain, unspecified: Secondary | ICD-10-CM

## 2023-04-02 NOTE — Progress Notes (Signed)
CARDIOLOGY [facility] HEART & VASCULAR [place], [facility] CENTER  [address] [place] [address]      Cardiology Follow Up Note     Name: Holly Fritz MRN:  [id number]   Date: 04/02/2023  Age:    43 y.o.     Date of Birth: Oct 23, 1980      Provider: Cletus Gash, MD  PCP: [place] Taylor Hott, NP    Reason for Visit: Follow Up    History:     Holly Fritz is a 43 y.o. White female follow-up from November 29, 2022 post coronary CTA which was essentially unremarkable.  She continues to have chest pain.  She was seen by GI and Pulmonary team recently.    Pertinent labs:     Coronary CTA done January 20, 2023: Calcium score 7 in the LAD with normal coronaries, minimal LAD 1-24%.    Holter monitoring done Jul 10, 2022 for 2 weeks:  Heart rate variability between 52 to 154 with an average heart rate of 80 per minute.  One episode of narrow QRS complex tachycardia 6 beats at a rate of 154 per minute.  Rare PACs and PVCs.       Echocardiogram done Jul 10, 2022:  Left ventricular ejection fraction 60%, trace mitral, tricuspid and pulmonary regurgitation.  Normal aortic root dimension.       Regular stress test done July 25, 2022:  Nondiagnostic due to inability to attain target heart rate.       MPI done September 23, 2022:  No segmental reversible defect with artifact related to GI tracer activity.  Left ventricle ejection fraction 55%...    Past Medical History:     Past Medical History:   Diagnosis Date    Autoimmune disorder (CMS HCC)     Awareness under anesthesia     Cervicocranial syndrome     Chiari I malformation (CMS HCC)     Chronic pain     Convergence insufficiency     Dysautonomia (CMS HCC)     Ehlers-Danlos syndrome     Endometriosis     Headache     History of anesthesia complications     patient states make sure you give me enough medication to go to sleep and stay asleep or I will wake up during procedure    POTS (postural orthostatic tachycardia syndrome)     Scoliosis     Shortness  of breath          Past Surgical History:     Past Surgical History:   Procedure Laterality Date    ESOPHAGEAL DILATION      HX LAP CHOLECYSTECTOMY      HX MYOMECTOMY      HX WISDOM TEETH EXTRACTION      POSTERIOR FOSSA DECOMPRESSION      2017         Allergies:     Allergies   Allergen Reactions    Adhesive Rash    Doxycycline Rash     Agitation      Propranolol Hives/ Urticaria, Itching and Rash    Ciprofloxacin     Gabapentin  Other Adverse Reaction (Add comment)     Patient reports is "sensitive" to medication.    Levaquin [Levofloxacin]     Oxycodone      Patient reports "sensitivity" to medication.    Penicillins      unknown reaction when an infant - HAS TOLERATED KEFLEX    Tapentadol  Patient reports "sensitivity" to medication.     Medications:     Current Outpatient Medications   Medication Sig    aspirin-acetaminophen-caffeine (EXCEDRIN) 250-250-65 mg Oral Tablet Take 2 Tablets by mouth Every 6 hours as needed for Migraine    capsaicin (ZOSTRIX) 0.025 % Cream by Apply Topically route Twice daily (Patient not taking: Reported on 12/03/2022)    cetirizine (ZYRTEC) 10 mg Oral Tablet Take 1 Tablet (10 mg total) by mouth Twice daily    clobetasoL-gauze-silicone 0.05 %- 4" X 4" Apply externally Kit Apply topically    clonazePAM (KLONOPIN) 0.5 mg Oral Tablet Take 1 Tablet (0.5 mg total) by mouth Twice daily    cyclobenzaprine (FLEXERIL) 10 mg Oral Tablet Take 1 Tablet (10 mg total) by mouth Every 8 hours as needed for Muscle spasms    diphenhydrAMINE (BENADRYL) 25 mg Oral Capsule Take 1 Capsule (25 mg total) by mouth Once per day as needed for Itching    famotidine (PEPCID) 10 mg Oral Tablet Take 1 Tablet (10 mg total) by mouth Twice daily    hydrOXYzine HCL (ATARAX) 25 mg Oral Tablet Take 2 Tablets (50 mg total) by mouth Three times a day    Ibuprofen (MOTRIN) 600 mg Oral Tablet Take 1 Tab (600 mg total) by mouth Four times a day as needed for Pain    lamoTRIgine (LAMICTAL) 100 mg Oral Tablet Take 1 Tablet  (100 mg total) by mouth Twice daily    lidocaine (LIDODERM) 5 % Adhesive Patch, Medicated APPLY 1 PATCH BY TOPICAL ROUTE ONCE DAILY (MAY WEAR UP TO 12HOURS.)    lidocaine (XYLOCAINE) 5 % Ointment APPLY TO AFFECTED AREA 1-4 TIMES DAILY AS NEEDED    omeprazole (PRILOSEC) 20 mg Oral Capsule, Delayed Release(E.C.) Take 1 Capsule (20 mg total) by mouth Once a day    ondansetron (ZOFRAN ODT) 4 mg Oral Tablet, Rapid Dissolve Take 1 Tab (4 mg total) by mouth Every 8 hours as needed for Nausea/Vomiting    pantoprazole (PROTONIX) 40 mg Oral Tablet, Delayed Release (E.C.) Take 1 Tablet (40 mg total) by mouth Twice daily 30 min before breakfast and dinner Indications: gastroesophageal reflux disease (Patient not taking: Reported on 04/02/2023)    sodium chloride (SALINE NOSE NASL) by Nasal route    Tacrolimus 0.1 % Ointment Apply topically Twice daily    traZODone (DESYREL) 50 mg Oral Tablet Take 1 Tablet (50 mg total) by mouth Every night as needed for Insomnia    TURMERIC ORAL Take by mouth Once a day     Family History:     Family Medical History:       Problem Relation (Age of Onset)    Lymphoma Mother    No Known Problems Father, Sister, Brother, Maternal Grandmother, Maternal Grandfather, Paternal Grandmother, Paternal Grandfather, Daughter, Son, Maternal Aunt, Maternal Uncle, Paternal Aunt, Paternal Uncle, Other            Social History:     Social History     Socioeconomic History    Marital status: Legally Separated   Tobacco Use    Smoking status: Every Day     Current packs/day: 1.50     Average packs/day: 1.5 packs/day for 20.0 years (30.0 ttl pk-yrs)     Types: Cigarettes     Passive exposure: Current (smoker)    Smokeless tobacco: Never    Tobacco comments:     1.5 ppd   Vaping Use    Vaping status: Never Used   Substance and Sexual  Activity    Alcohol use: Not Currently    Drug use: Yes     Types: Marijuana     Comment: daily   Other Topics Concern    Ability to Walk 1 Flight of Steps without SOB/CP Yes     Ability to Walk 2 Flight of Steps without SOB/CP Yes     Social Determinants of Health      Received from [employer]    Social Connections   Intimate Partner Violence: Not At Risk (01/10/2022)    Received from [facility], [facility]    Interpersonal Violence     Interpersonal Violence: No    Received from [facility], [facility]    Housing Stability     Review of Systems:     All pertinent systems were reviewed and are negative other than noted in the HPI.    Physical Exam:     BP 103/70   Pulse 78   Temp 36.1 C (97 F) (Thermal Scan)   Ht 1.626 m (5\' 4" )   Wt 46.4 kg (102 lb 3.2 oz)   SpO2 98%   BMI 17.54 kg/m        General: Alert and oriented in no apparent distress.    Neck: Supple, carotids are 3+ bilaterally without audible bruits.    Cardiac examination:  No JVD, no S3, no friction rub and no murmur.    Lungs:  Clear bilaterally.    Extremities:  No pitting edema with intact ankle pulses bilaterally.  CARDIAC MARKERS  Lab Results   Component Value Date    CPK 21 (L) 05/03/2018         BASIC METABOLIC PANEL  Lab Results   Component Value Date    SODIUM 137 09/11/2018    POTASSIUM 3.8 09/11/2018    CHLORIDE 104 09/11/2018    CO2 24 09/11/2018    ANIONGAP 9 (L) 09/11/2018    BUN 14 09/11/2018    CREATININE 0.70 09/11/2018    BUNCRRATIO 20 09/11/2018    GFR 110 09/11/2018    CALCIUM 9.5 09/11/2018    GLUCOSE Negative 05/08/2018    GLUCOSENF 90 09/11/2018        CBC  Diff   Lab Results   Component Value Date/Time    WBC 7.5 12/05/2022 05:41 PM    HGB 12.2 12/05/2022 05:41 PM    HCT 36.6 12/05/2022 05:41 PM    PLTCNT 342 12/05/2022 05:41 PM    ESR 7 08/27/2017 08:08 AM    RBC 3.99 12/05/2022 05:41 PM    MCV 91.7 12/05/2022 05:41 PM    MCHC 33.3 12/05/2022 05:41 PM    MCH 30.6 12/05/2022 05:41 PM    MPV 8.7 12/05/2022 05:41 PM    Lab Results   Component Value Date/Time    PMNS 76 12/07/2021 02:13 PM    LYMPHOCYTES 26 08/26/2017 09:34 PM    EOSINOPHIL 0  08/26/2017 09:34 PM    MONOCYTES 5 12/07/2021 02:13 PM    BASOPHILS 0 12/07/2021 02:13 PM    BASOPHILS <0.04 12/07/2021 02:13 PM    PMNABS 6.41 12/07/2021 02:13 PM    LYMPHSABS 1.59 12/07/2021 02:13 PM    EOSABS <0.04 12/07/2021 02:13 PM    MONOSABS 0.45 12/07/2021 02:13 PM           Lab Results   Component Value Date    CHOLESTEROL 181 12/07/2021    HDLCHOL 47 (L) 12/07/2021    LDLCHOL 121 (  H) 12/07/2021    TRIG 71 12/07/2021      Hepatic Function  Lab Results   Component Value Date    ALBUMIN 4.8 09/11/2018    TOTALPROTEIN 7.9 09/11/2018    ALKPHOS 78 09/11/2018    PROTHROMTME 12.1 05/08/2018    INR 1.04 05/08/2018    AST 23 09/11/2018    ALT 13 09/11/2018          Lab Results   Component Value Date    TSH 0.396 12/07/2021    FREET4 0.90 12/07/2021             Recent Results (from the past 2160 hours)   CTA HEART CORONARY     Status: None    Narrative    FOR NONCARDIAC PORTION ONLY:  Per interdepartmental arrangement, the cardiovascular portion of this exam has been supervised, protocoled and interpreted by the cardiology department. I, Jasmine Pang, M.D. radiologist have been asked to interpret extra-cardiovascular portion of the exam, only after the cardiac portion of the report has been completed and signed by the reading cardiologist (see above). Of note, the exam is designed for cardiac evaluation with only limited imaging of extra cardiac structures.    Comparison: Prior chest CT 10/16/2022, prior CT chest abdomen pelvis dated 08/27/2017      Impression    FINDINGS/IMPRESSION:    1. The visualized central airways are patent. No focal consolidation, pleural effusion or pneumothorax in the visualized lung fields.   2. No evidence of aortic dissection along the visualized thoracic aorta. No central pulmonary embolism identified on this nondedicated exam. No lymphadenopathy in the visualized part of the chest.   3. The visualized esophagus is decompressed. No acute abnormality in the upper abdomen.  4. Visualized  chest wall is unremarkable. Mild degenerative changes of the spine. No acute osseous abnormality identified.            XR CERVICAL SPINE COMPLETE (4 OR MORE VIEWS)     Status: None    Narrative    Holly Fritz    Female, 43 years old.    XR CERVICAL SPINE COMPLETE (4 OR MORE VIEWS) performed on 02/10/2023 6:57 PM.    REASON FOR EXAM:  M54.2: Cervicalgia  Cervicalgia    TECHNIQUE: 4 views/4 images submitted for interpretation. Flexion and extension lateral views in addition to neutral demonstrates normal alignment. There is postsurgical mesh repair of the basioccipital. There is normal vertebral body height and alignment with no instability demonstrated. Disc space narrowing is mild at C5-6 and C4-5.      Impression    1. Mild degenerative disc changes, without malalignment or instability.      Radiologist location ID: [id number]            Cardiovascular Workup:   ECG: No results found for this or any previous visit (from the past [id number] hours).    ECHO: Results for orders placed during the hospital encounter of 01/20/23    TRANSTHORACIC ECHOCARDIOGRAM - ADULT 01/20/2023  9:53 AM    Narrative  **See full report in linked PDF document**  Transthoracic Echocardiographic Report    ______________________________________________________________________________  Name: Holly Fritz, Holly Fritz                      MRN: [id number]               Weight: 105 lb  Study Date: 01/20/2023 09:14 AM  DOB: [birth year]             Height: 64 in  Gender: Female                               Age: 57 yrs                 BSA: 1.5 m2  Accession #: [id number]                   BP: 123/84 mmHg  Patient Location: [facility]  Ordering Provider: Cletus Gash  Tech: AG    ______________________________________________________________________________  Procedure:  Transthoracic complete echo, 2D, spectral and tissue Doppler, color flow Doppler, M-mode.    Quality:  The study images were of technically adequate  quality.    Indications: Chest pain, unspecified type,Ehlers-Danlos syndrome    Conclusions:  Left ventricular systolic function is normal.  Normal right ventricular systolic function.  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  There is a trivial pericardial effusion.    No significant valvular heart disease.    Findings  Left Ventricle:   Normal left ventricular size. Concentric remodeling. Ejection Fraction is 69.6 %. Left ventricular systolic function is normal. No segmental/regional wall motion abnormalities  identified. Left ventricular diastolic parameters are normal.  Right Ventricle:   Normal right ventricular size. Normal right ventricular systolic function. Right ventricular systolic pressure is normal.  Left Atrium:   The left atrium is normal in size.  Right Atrium:   The right atrium is of normal size.  Mitral Valve:   The mitral valve is normal. No evidence of mitral stenosis. Trace mitral regurgitation present.  Tricuspid Valve:   The tricuspid valve is normal. There is mild tricuspid regurgitation.  Aortic Valve:   Trileaflet aortic valve. No Aortic valve stenosis. There is no evidence of aortic regurgitation.  Pulmonic Valve:   The pulmonic valve is not well visualized. No significant pulmonic valve regurgitation present.  Atrial Septum:   The interatrial septum is normal in appearance.  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  Aorta:   The aortic root is of normal size.  Pericardium/Pleural space:   There is a trivial pericardial effusion.    Electronically signed by: MD Leodis Liverpool on 01/20/2023 01:57 PM     Stress Test:   Last Stress Result   MYOCARDIAL PERFUSION COMPLETE   Result Value Ref Range    Nuc Stress EF 55 %    TID 1.05     END DIASTOLIC INDEX 75.0     END SYSTOLIC INDEX 34.0     EF 59.0     END DIASTOLIC INDEX (NUC) 68.0     END SYSTOLIC INDEX (NUC) 28.0     Narrative      Post-stress ejection  fraction was 55%. Stress end diastolic index:   75.0. Stress end systolic index: 34.0.    There is no prior study available for comparison.    Suboptimal evaluation of the inferior wall at stress due to GI activity  Otherwise, there are no reversible perfusion defects seen to suggest   stress-induced ischemia.    Lexiscan Cardiolite stress test.    Reason for test chest pain shortness of breath   IV Lexiscan 0.4 mg injected per protocol   Heart rate blood pressure response was normal recovery phase was normal  No chest  pain no chest tightness   No EKG evidence of ischemia     Cardiolite portion showed an ejection fraction of 55%.  Suboptimal   evaluation of the inferior wall at stress due to GI activity   No reversible perfusion defect seen to suggest stress-induced ischemia     Test conclusion low risk study patient tolerated test well with no   complications.     Last Stress Result   STRESS TEST - ADULT    Narrative    Bruce protocol treadmill stress test   Reason for test chest pain palpitations   Patient was not sure how much she could walk she has Carylon Perches Danlos   syndrome and dysautonomia    Patient was unable to walk she could only walk for about a couple of   minutes and then we had to stop   Optimal heart rate was not achieved.  Suboptimal stress test.      Test conclusion suboptimal stress test.  WOULD RECOMMEND A LEXISCAN STRESS   TEST SINCE PATIENT NOT ABLE TO EXERCISE ON THE TREADMILL.        Cardiac Cath: No results found for this or any previous visit.     Assessment:       ICD-10-CM    1. Chest pain, unspecified type  R07.9          1. Chest pain:  Coronary CTA done January 20, 2023: Normal coronaries with calcium score 7 in the LAD distribution.  2. Palpitation which has been lately worsening.  Holter monitoring done Jul 10, 2022 essentially unremarkable.  3. Ehlers-Danlos syndrome.  4. History of depression.    5.  Autoimmune enteropathy.    6. Autoimmune autonomic dysfunction.  7. Dysautonomia.  8.  History of migraine.    9. Family history of thoracic aortic aneurysm.    Plan:   No orders of the defined types were placed in this encounter.       1. If chest pain continues to reoccur workup for noncardiac etiology as clinically deemed necessary under the direction of her primary care physician.    2. Return to cardiac clinic in 1 year sooner if needed.  3. No further cardiac intervention is warranted at this time.    Thank you for allowing me to participate in the care of your patient. Please feel free to contact me if there are further questions.     Cletus Gash, MD

## 2023-04-03 ENCOUNTER — Ambulatory Visit (INDEPENDENT_AMBULATORY_CARE_PROVIDER_SITE_OTHER): Payer: Self-pay | Admitting: Family

## 2023-04-03 NOTE — Telephone Encounter (Signed)
-----   Message from Danella Maiers, RN sent at 04/03/2023  3:33 PM EST -----  Regarding: FW: Clinical Question  ----- Message from Emmit Alexanders sent at 04/03/2023  3:28 PM EST -----  Copied From CRM [id number].Holly Fritz, Delanna () called with a clinical question. The pt called in and stated that she need a sooner appt than what she is scheduled for and she has concerns about losing weight . The pt also stated that she was messaging with the Dr and Morrie Sheldon for a sooner appt. Please call and advise

## 2023-04-10 ENCOUNTER — Other Ambulatory Visit: Payer: Self-pay

## 2023-04-10 ENCOUNTER — Ambulatory Visit
Admission: RE | Admit: 2023-04-10 | Discharge: 2023-04-10 | Disposition: A | Discharge status: Home / Self Care | Payer: Medicaid Other | Source: Ambulatory Visit | Attending: Family | Admitting: Family

## 2023-04-10 DIAGNOSIS — E041 Nontoxic single thyroid nodule: Secondary | ICD-10-CM | POA: Insufficient documentation

## 2023-04-18 ENCOUNTER — Other Ambulatory Visit: Payer: Self-pay

## 2023-04-18 ENCOUNTER — Ambulatory Visit: Payer: Self-pay | Attending: Family | Admitting: Family

## 2023-04-18 ENCOUNTER — Encounter (INDEPENDENT_AMBULATORY_CARE_PROVIDER_SITE_OTHER): Payer: Self-pay | Admitting: Family

## 2023-04-18 ENCOUNTER — Encounter (INDEPENDENT_AMBULATORY_CARE_PROVIDER_SITE_OTHER): Payer: Self-pay

## 2023-04-18 VITALS — BP 126/86 | HR 70 | Temp 97.7°F | Resp 16 | Ht 64.0 in | Wt 114.4 lb

## 2023-04-18 DIAGNOSIS — K224 Dyskinesia of esophagus: Secondary | ICD-10-CM | POA: Insufficient documentation

## 2023-04-18 DIAGNOSIS — R140 Abdominal distension (gaseous): Secondary | ICD-10-CM | POA: Insufficient documentation

## 2023-04-18 DIAGNOSIS — R1310 Dysphagia, unspecified: Secondary | ICD-10-CM | POA: Insufficient documentation

## 2023-04-18 DIAGNOSIS — R1013 Epigastric pain: Secondary | ICD-10-CM | POA: Insufficient documentation

## 2023-04-18 DIAGNOSIS — R112 Nausea with vomiting, unspecified: Secondary | ICD-10-CM | POA: Insufficient documentation

## 2023-04-18 DIAGNOSIS — Z7982 Long term (current) use of aspirin: Secondary | ICD-10-CM

## 2023-04-18 DIAGNOSIS — R197 Diarrhea, unspecified: Secondary | ICD-10-CM | POA: Insufficient documentation

## 2023-04-18 DIAGNOSIS — R634 Abnormal weight loss: Secondary | ICD-10-CM | POA: Insufficient documentation

## 2023-04-18 MED ORDER — FAMOTIDINE 40 MG TABLET
40.0000 mg | ORAL_TABLET | Freq: Two times a day (BID) | ORAL | 1 refills | Status: DC | PRN
Start: 2023-04-18 — End: 2024-01-05

## 2023-04-18 MED ORDER — OMEPRAZOLE 40 MG CAPSULE,DELAYED RELEASE
40.0000 mg | DELAYED_RELEASE_CAPSULE | Freq: Every day | ORAL | 1 refills | Status: DC
Start: 2023-04-18 — End: 2023-06-10

## 2023-04-18 NOTE — Progress Notes (Signed)
 GASTROENTEROLOGY/HEPATOLOGY, PHYSICIAN OFFICE CENTER  [address]  [place] [address]  Operated by [facility]     Name: Holly Fritz MRN:  [id number]   Date: 04/18/2023 Age: 43 y.o.       Requesting Physician: H[place], [place] Taylor, NP  Source: Patient and review of records.     History of Present Illness  Holly Fritz is a 43 y.o. female recently known to me for abdominal pain, dysphagia, weight loss.    Interval History:   Onset of symptoms around 2015.   Prev followed with Dr. 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  Was enrolled in GI pain program at one point.  Had dietician and TNP 2017-2018.   Initially dizziness and presyncopal during meals.   S/P Chole, 2020.  Had relief of GI symptoms for about 1 yr.   Reports having unremarkable colonoscopy around 2018.  No records presently available.   Pt is under a significant amount of stress (mom recently passed, caregiver for g-ma, recently divorced.) In grief counseling. Good support system. GI symptoms preceded stressors.      Summary of prior testing:  11-24-15 NM Esophageal Motility: pos for delayed esophageal emptying, neg for delayed gastric emptying, neg for delayed small bowel emptying, pos for delayed colonic emptying diffusely and in colon and rectosigmoid (results reviewed on Pts phone)  Oct 2024 XR KUB:  Unremarkable  Oct 2024 Elastase: normal   Nov 2024 BAS: no reflux, mild esophageal dysmotility   Dec 2024 EGD:  Normal esophagus, dilated.  Normal stomach, normal ampulla, normal duodenal.  Path:  Squamous mucosa with reactive changes proximal and distal esophagus     Today:   Objective 6 lb wt gain over the past 1 yr.   Pt concerned today's wt inaccurate bc she weighed 102 lbs last week at cardiology.   Fear of eating has recently returned.   She continues to work with a counselor regularly, but has not found dietitian helpful in the past.  Continues to have post prandial epigastric  abd swelling, bloating and cramping pain.   Vomiting undigested food within 30 min of eating about 50% of meals.  No specific food triggers.   Pain radiating from abd behind chest exacerbated following dilation.   Dysphagia worse: solids and liquids.  Feels stuck at neck.   Occurs daily, but not every meal.   No reflux.   Up to 10 stools each morning with sensation of complete emptying.  Consistency varies.   No rectal bleeding.  Uses THC for relief.   Increased frequency HA's attributed to neck pain.    Past History  Current Outpatient Medications   Medication Sig    aspirin-acetaminophen-caffeine (EXCEDRIN) 250-250-65 mg Oral Tablet Take 2 Tablets by mouth Every 6 hours as needed for Migraine    capsaicin (ZOSTRIX) 0.025 % Cream Apply topically Twice daily    cetirizine (ZYRTEC) 10 mg Oral Tablet Take 1 Tablet (10 mg total) by mouth Twice daily    clobetasoL-gauze-silicone 0.05 %- 4" X 4" Apply externally Kit Apply topically    clonazePAM (KLONOPIN) 0.5 mg Oral Tablet Take 1 Tablet (0.5 mg total) by mouth Twice daily (Patient taking differently: Take 1 Tablet (0.5 mg total) by mouth Three times a day)    cyclobenzaprine (FLEXERIL) 10 mg Oral Tablet Take 1 Tablet (10 mg total) by mouth Every 8 hours as needed for Muscle spasms    diphenhydrAMINE (BENADRYL) 25 mg Oral Capsule Take 1 Capsule (25  mg total) by mouth Once per day as needed for Itching    famotidine (PEPCID) 10 mg Oral Tablet Take 1 Tablet (10 mg total) by mouth Twice daily    hydrOXYzine HCL (ATARAX) 25 mg Oral Tablet Take 2 Tablets (50 mg total) by mouth Three times a day (Patient taking differently: Take 2 Tablets (50 mg total) by mouth Three times a day)    Ibuprofen (MOTRIN) 600 mg Oral Tablet Take 1 Tab (600 mg total) by mouth Four times a day as needed for Pain (Patient not taking: Reported on 04/18/2023)    lamoTRIgine (LAMICTAL) 100 mg Oral Tablet Take 1 Tablet (100 mg total) by mouth Twice daily    lidocaine (LIDODERM) 5 % Adhesive Patch,  Medicated APPLY 1 PATCH BY TOPICAL ROUTE ONCE DAILY (MAY WEAR UP TO 12HOURS.)    lidocaine (XYLOCAINE) 5 % Ointment APPLY TO AFFECTED AREA 1-4 TIMES DAILY AS NEEDED    omeprazole (PRILOSEC) 20 mg Oral Capsule, Delayed Release(E.C.) Take 1 Capsule (20 mg total) by mouth Once a day    ondansetron (ZOFRAN ODT) 4 mg Oral Tablet, Rapid Dissolve Take 1 Tab (4 mg total) by mouth Every 8 hours as needed for Nausea/Vomiting    pantoprazole (PROTONIX) 40 mg Oral Tablet, Delayed Release (E.C.) Take 1 Tablet (40 mg total) by mouth Twice daily 30 min before breakfast and dinner Indications: gastroesophageal reflux disease (Patient not taking: Reported on 04/02/2023)    sodium chloride (SALINE NOSE NASL) by Nasal route    Tacrolimus 0.1 % Ointment Apply topically Twice daily    traZODone (DESYREL) 50 mg Oral Tablet Take 1 Tablet (50 mg total) by mouth Every night as needed for Insomnia    TURMERIC ORAL Take by mouth Once a day     Allergies   Allergen Reactions    Adhesive Rash    Doxycycline Rash     Agitation      Propranolol Hives/ Urticaria, Itching and Rash    Ciprofloxacin     Gabapentin  Other Adverse Reaction (Add comment)     Patient reports is "sensitive" to medication.    Levaquin [Levofloxacin]     Oxycodone      Patient reports "sensitivity" to medication.    Penicillins      unknown reaction when an infant - HAS TOLERATED KEFLEX    Tapentadol      Patient reports "sensitivity" to medication.     Past Medical History:   Diagnosis Date    Autoimmune disorder (CMS HCC)     Awareness under anesthesia     Cervicocranial syndrome     Chiari I malformation (CMS HCC)     Chronic pain     Convergence insufficiency     Dysautonomia (CMS HCC)     Ehlers-Danlos syndrome     Endometriosis     Headache     History of anesthesia complications     patient states make sure you give me enough medication to go to sleep and stay asleep or I will wake up during procedure    POTS (postural orthostatic tachycardia syndrome)     Scoliosis      Shortness of breath          Past Surgical History:   Procedure Laterality Date    ESOPHAGEAL DILATION      HX LAP CHOLECYSTECTOMY      HX MYOMECTOMY      HX WISDOM TEETH EXTRACTION      POSTERIOR FOSSA DECOMPRESSION  2017         Family History  Family Medical History:       Problem Relation (Age of Onset)    Lymphoma Mother    No Known Problems Father, Sister, Brother, Maternal Grandmother, Maternal Grandfather, Paternal Grandmother, Paternal Grandfather, Daughter, Son, Maternal Aunt, Maternal Uncle, Paternal Aunt, Paternal Uncle, Other            Social History  Social History     Socioeconomic History    Marital status: Legally Separated   Tobacco Use    Smoking status: Every Day     Current packs/day: 0.50     Average packs/day: 1.5 packs/day for 20.2 years (30.1 ttl pk-yrs)     Types: Cigarettes     Start date: 2025     Passive exposure: Current (smoker)    Smokeless tobacco: Never    Tobacco comments:     1.5 ppd   Vaping Use    Vaping status: Never Used   Substance and Sexual Activity    Alcohol use: Not Currently    Drug use: Yes     Types: Marijuana     Comment: daily   Other Topics Concern    Ability to Walk 1 Flight of Steps without SOB/CP Yes    Ability to Walk 2 Flight of Steps without SOB/CP Yes     Social Determinants of Health      Received from [employer]    Social Connections   Intimate Partner Violence: Not At Risk (01/10/2022)    Received from [facility], [facility]    Interpersonal Violence     Interpersonal Violence: No    Received from [facility], [facility]    Housing Stability     Review of Systems  As detailed in HPI.  All other systems negative.     Examination    Vitals:    04/18/23 0805   BP: (!) 132/92   Pulse: 70   Resp: 16   Temp: 36.5 C (97.7 F)   TempSrc: Thermal Scan   SpO2: 100%   Weight: 51.9 kg (114 lb 6.7 oz)   Height: 1.626 m (5\' 4" )   BMI: 19.64         Body mass index is 19.64 kg/m.     General: No acute  distress.  Nontoxic appearance.    Neuro: Awake, alert, and oriented X 3.    HEENT: Normocephalic, atraumatic. Sclera non-icteric. Conjunctiva pink.     Neck: Supple and without lymphadenopathy.    Lungs: All fields clear to auscultation.  No cough or dyspnea.   Heart: Regular rate and rhythm. No murmur, gallop, click, or rub.   Abd: Soft, nondistended, nontender.  Bowel sounds positive all quadrents.  No organomegaly.   Ext: Without edema and cyanosis.   Skin: Uniformly pink and without rash, jaundice, or bruising.    Psych: Alert and pleasant.  Stable mood.       Impression / Recommendations:  Very pleasant 43 y/o female recently known to me for abdominal pain, dysphagia, weight loss.    Dysphagia  - esophageal manometry to assess for progression of motility disorder  - begin omeprazole 40 mg daily, 30 minutes prior to breakfast  - begin famotidine 40 mg b.i.d. PRN  - continue to avoid NSAIDs, tobacco, alcohol    Abdominal  Weight loss  - abdominal duplex to assess for MALS   - 4 hour gastric emptying study to assess for  gastroparesis  - SIBO breath test    Orders Placed This Encounter    91065 - BREATH HYDROGEN/METHANE TEST (AMB ONLY)    NUC GASTRIC EMPTYING STUDY    ABDOMINAL DUPLEX - CELIAC AND SMA    omeprazole (PRILOSEC) 40 mg Oral Capsule, Delayed Release(E.C.)    famotidine (PEPCID) 40 mg Oral Tablet     RTC 1-2 weeks after Esophageal Manometry.     The patient was given ample opportunity to ask questions and those questions were answered to the patient's satisfaction. Explanation and counseling provided specific to medications, labs, imaging, endoscopy, as appropriate per patient.  The patient was encouraged to be involved in their own care.  The patient was told to contact me with any additional questions or concerns.  I instructed the patient to use [facility] for messages, or call the office.   Caralee Ates, APRN,NP-C   04/18/2023, 08:10    Portions of this note may be dictated using MModal Fluency.  Parts of this patient's chart may be completed in a retrospective fashion due to simultaneous direct patient care activities.  Variances in spelling and vocabulary are possible and unintentional. Not all errors are caught/corrected. Please notify the Thereasa Parkin if any discrepancies are noted or if the meaning of any statement is not clear.

## 2023-04-21 ENCOUNTER — Encounter (INDEPENDENT_AMBULATORY_CARE_PROVIDER_SITE_OTHER): Payer: Self-pay

## 2023-04-21 ENCOUNTER — Encounter (HOSPITAL_COMMUNITY): Payer: Self-pay | Admitting: Student in an Organized Health Care Education/Training Program

## 2023-04-28 ENCOUNTER — Encounter (INDEPENDENT_AMBULATORY_CARE_PROVIDER_SITE_OTHER): Payer: Self-pay

## 2023-04-30 NOTE — Telephone Encounter (Signed)
 Patient didn't respond to MyChart, called and pt states she will call us when she is ready to schedule that she has a lot of other appointments right now, but will let us know and try to have it done before scheduled appt with Maralyn Sago in [place].   Lalla Brothers, RN

## 2023-05-01 ENCOUNTER — Ambulatory Visit: Payer: Self-pay | Attending: NURSE PRACTITIONER | Admitting: NURSE PRACTITIONER

## 2023-05-01 ENCOUNTER — Encounter (RURAL_HEALTH_CENTER): Payer: Self-pay | Admitting: NURSE PRACTITIONER

## 2023-05-01 ENCOUNTER — Other Ambulatory Visit: Payer: Self-pay

## 2023-05-01 VITALS — BP 111/87 | HR 65 | Temp 97.1°F | Resp 16 | Ht 64.0 in | Wt 108.0 lb

## 2023-05-01 DIAGNOSIS — R0602 Shortness of breath: Secondary | ICD-10-CM | POA: Insufficient documentation

## 2023-05-01 DIAGNOSIS — Q796 Ehlers-Danlos syndrome, unspecified: Secondary | ICD-10-CM | POA: Insufficient documentation

## 2023-05-01 DIAGNOSIS — T7840XA Allergy, unspecified, initial encounter: Secondary | ICD-10-CM | POA: Insufficient documentation

## 2023-05-01 DIAGNOSIS — K219 Gastro-esophageal reflux disease without esophagitis: Secondary | ICD-10-CM | POA: Insufficient documentation

## 2023-05-01 NOTE — Nursing Note (Signed)
 05/01/23 1600   OTHER   All systems reviewed and some are positive- see below   Constitutional   Constitutional Positive for Fever;Chills;Malaise/Fatigue   HENT   HENT Positive for Headaches;Congestion   Cardiovascular   Cardiovascular Positive for Palpitations   Respiratory   Respiratory Positive for Cough;Shortness of Breath;Sputum Production  (clear/brown sputum)   Gastrointestinal   Gastrointestinal Positive for Heartburn;Nausea;Vomiting

## 2023-05-01 NOTE — Progress Notes (Addendum)
 PULMONARY, [facility]  [address] [facility] [address] [facility] [place] [address]                                                                                  Patient Name: Holly Fritz  Date: 05/01/2023  MRN: [id number]  DOB: January 10, 1981    Chief Complaint:   Chief Complaint   Patient presents with    Establish Care    Abnormal Chest Imaging       History of Present Illness:   Holly Fritz is a 43 y.o. female who presents today to establish care for abnormal chest imaging. She has an underlying hx of POTS, GERD, Ehlers-Danlos and thyroid  nodules. She is currently following with cardiology. Today, she describes shortness of breath with exertion, more than 1 flight of stairs, and up inclines. This has worsened over the last 2 years, which she attributes to her living conditions being a "moldy basement" at her grandmothers. She also states she has been helping clean out her other grandmothers house, which she describes as "filthy". She also has a cough with brown/clear sputum production. This is worse in the morning. She denies any wheezing or nocturnal symptoms. She does have an Albuterol inhaler from having COVID, and she uses it for sputum production. She describes seasonal allergies that is worse in the spring controlled with Zyrtec . She has also had allergy testing showing allergies to cats, dust, and certain tree's. Patient has GERD, difficulty swallowing, and frequent choking. She is following with gastroenterology, and is maintained on Pepcid . She denies any frequent upper respiratory infections. Patient is a half a pack a day smoker since 2000. She also smokes marijuana 2-3 times a day. Her mother was also a smoker, and has a hx of a brain tumor. Her grandmother has COPD, and her paternal grandfather had lung cancer. The grandfather had smoke and occupational exposures. The patient denies any chest pain, edema, purulence sputum, fevers, shaking chills, or hemoptysis.      Medication  History    Current Outpatient Medications   Medication Sig    albuterol sulfate (PROVENTIL OR VENTOLIN OR PROAIR) 90 mcg/actuation Inhalation oral inhaler Take 2 Puffs by inhalation Every 4 hours as needed    aspirin-acetaminophen-caffeine (EXCEDRIN) 250-250-65 mg Oral Tablet Take 2 Tablets by mouth Every 6 hours as needed for Migraine    capsaicin  (ZOSTRIX) 0.025 % Cream Apply topically Twice daily    cetirizine  (ZYRTEC ) 10 mg Oral Tablet Take 1 Tablet (10 mg total) by mouth Twice daily    clobetasoL-gauze-silicone 0.05 %- 4" X 4" Apply externally Kit Apply topically    clonazePAM (KLONOPIN) 0.5 mg Oral Tablet Take 1 Tablet (0.5 mg total) by mouth Twice daily    cyclobenzaprine (FLEXERIL) 10 mg Oral Tablet Take 1 Tablet (10 mg total) by mouth Every 8 hours as needed for Muscle spasms    diphenhydrAMINE  (BENADRYL ) 25 mg Oral Capsule Take 1 Capsule (25 mg total) by mouth Once per day as needed for Itching    famotidine  (PEPCID ) 40 mg Oral Tablet Take 1 Tablet (40 mg total) by mouth Twice per day as needed Indications: heartburn    hydrOXYzine  HCL (ATARAX )  25 mg Oral Tablet Take 2 Tablets (50 mg total) by mouth Three times a day    lamoTRIgine (LAMICTAL) 100 mg Oral Tablet Take 1 Tablet (100 mg total) by mouth Twice daily    lidocaine  (LIDODERM ) 5 % Adhesive Patch, Medicated APPLY 1 PATCH BY TOPICAL ROUTE ONCE DAILY (MAY WEAR UP TO 12HOURS.)    lidocaine  (XYLOCAINE ) 5 % Ointment APPLY TO AFFECTED AREA 1-4 TIMES DAILY AS NEEDED    omeprazole  (PRILOSEC) 40 mg Oral Capsule, Delayed Release(E.C.) Take 1 Capsule (40 mg total) by mouth Once a day 30 min before breakfast (Patient not taking: Reported on 05/01/2023)    ondansetron  (ZOFRAN  ODT) 4 mg Oral Tablet, Rapid Dissolve Take 1 Tab (4 mg total) by mouth Every 8 hours as needed for Nausea/Vomiting    sertraline (ZOLOFT) 50 mg Oral Tablet Take 1 Tablet (50 mg total) by mouth Daily (Patient not taking: Reported on 05/01/2023)    sodium chloride  (SALINE NOSE NASL) by Nasal  route    Tacrolimus 0.1 % Ointment Apply topically Twice daily    traZODone  (DESYREL ) 50 mg Oral Tablet Take 1 Tablet (50 mg total) by mouth Every night as needed for Insomnia    TURMERIC ORAL Take by mouth Once a day       Medical History  Past Medical History:   Diagnosis Date    Autoimmune disorder (CMS HCC)     Awareness under anesthesia     Cervicocranial syndrome     Chiari I malformation (CMS HCC)     Chronic pain     Convergence insufficiency     Dysautonomia (CMS HCC)     Ehlers-Danlos syndrome     Endometriosis     Esophageal reflux     Headache     History of anesthesia complications     patient states make sure you give me enough medication to go to sleep and stay asleep or I will wake up during procedure    POTS (postural orthostatic tachycardia syndrome)     Scoliosis     Shortness of breath     Thyroid  nodule         Past Surgical History  Past Surgical History:   Procedure Laterality Date    ESOPHAGEAL DILATION      HX CHOLECYSTECTOMY      HX COLONOSCOPY      HX LAP CHOLECYSTECTOMY      HX MYOMECTOMY      HX WISDOM TEETH EXTRACTION      POSTERIOR FOSSA DECOMPRESSION      2017           Family History   Family Medical History:       Problem Relation (Age of Onset)    COPD Paternal Grandmother    Congestive Heart Failure Maternal Grandmother    Hypertension (High Blood Pressure) Mother    No Known Problems Father, Sister, Maternal Grandfather, Paternal Grandfather, Daughter, Son, Maternal Aunt, Maternal Uncle, Paternal Aunt, Paternal Uncle, Other    Non-Hodgkin's lymphoma Maternal Grandmother    Primary Brain tumor Mother              Social History  Social History     Socioeconomic History    Marital status: Legally Separated    Number of children: 0   Occupational History    Occupation: disability    Occupation: Engineer, civil (consulting) farm   Tobacco Use    Smoking status: Every Day     Current packs/day: 0.50  Average packs/day: 0.5 packs/day for 25.2 years (12.6 ttl pk-yrs)     Types: Cigarettes     Start date:  2000     Passive exposure: Current (smoker)    Smokeless tobacco: Never    Tobacco comments:     1.5 ppd   Vaping Use    Vaping status: Never Used   Substance and Sexual Activity    Alcohol use: Not Currently    Drug use: Yes     Types: Marijuana     Comment: daily   Other Topics Concern    Ability to Walk 1 Flight of Steps without SOB/CP Yes    Ability to Walk 2 Flight of Steps without SOB/CP Yes   Social History Narrative    Respiratory history: None    Smoking history: Patient is a half a pack a day smoker since 2000. She also smokes marijuana 2-3 times a day.    Significant secondhand smoke exposure: Mother smoked    Smoking in the home: None    Occupational history: Disabled.    Occupational exposures: No exposures to chemicals, dust, pesticides, fumes or asbestos    The patient denies exposure to farms or raw sewage. Not raising wild birds. No hunting and skinning wild animals.    The patient has not been out country recently or [employer]:     [employer]: Chief Financial Officer: Forensic psychologist: Yes    Humidifier: No    Water : City    Pets in the home: Not currently     Social Determinants of Health     Financial Resource Strain: Low Risk  (05/01/2023)    Financial Resource Strain     SDOH Financial: No   Transportation Needs: Low Risk  (05/01/2023)    Transportation Needs     SDOH Transportation: No   Social Connections: Low Risk  (05/01/2023)    Social Connections     SDOH Social Isolation: 5 or more times a week   Intimate Partner Violence: Low Risk  (05/01/2023)    Intimate Partner Violence     SDOH Domestic Violence: No   Housing Stability: Low Risk  (05/01/2023)    Housing Stability     SDOH Housing Situation: I have housing.     SDOH Housing Worry: No        Alcohol use   Social History     Substance and Sexual Activity   Alcohol Use Not Currently       Tobacco use    Social History     Tobacco Use   Smoking Status Every Day    Current packs/day: 0.50    Average packs/day: 0.5 packs/day  for 25.2 years (12.6 ttl pk-yrs)    Types: Cigarettes    Start date: 2000    Passive exposure: Current (smoker)   Smokeless Tobacco Never   Tobacco Comments    1.5 ppd         ROS   Nursing Notes:   Holly Fritz, RTR  05/01/23 1621  Signed     05/01/23 1600   OTHER   All systems reviewed and some are positive- see below   Constitutional   Constitutional Positive for Fever;Chills;Malaise/Fatigue   HENT   HENT Positive for Headaches;Congestion   Cardiovascular   Cardiovascular Positive for Palpitations   Respiratory   Respiratory Positive for Cough;Shortness of Breath;Sputum Production  (clear/brown sputum)   Gastrointestinal   Gastrointestinal Positive for Heartburn;Nausea;Vomiting  Allergies reviewed:   Allergies   Allergen Reactions    Adhesive Rash    Doxycycline Rash     Agitation      Propranolol Hives/ Urticaria, Itching and Rash    Ciprofloxacin     Gabapentin  Other Adverse Reaction (Add comment)     Patient reports is "sensitive" to medication.    Levaquin [Levofloxacin]     Oxycodone      Patient reports "sensitivity" to medication.    Penicillins      unknown reaction when an infant - HAS TOLERATED KEFLEX    Tapentadol      Patient reports "sensitivity" to medication.          Review of systems reviewed  Chest CT reviewed      Imaging:     CT CHEST WO IV CONTRAST performed on 10/16/2022 2:12 PM.     REASON FOR EXAM:  J98.4: Other disorders of lung  CLINICAL INDICATION: J98.4: Other disorders of lung  RADIATION CT DOSE: 160.01   TECHNIQUE: Helical images were obtained through the chest. ALARA technique using automated exposure control, adjustment of mA and/or kV according to patient size and/or iterative reconstruction.     COMPARISON:  08/27/2017     FINDINGS:  There is no axillary, hilar, or mediastinal lymphadenopathy. No pleural or pericardial fluid. No coronary artery atherosclerotic calcification.     Mild scarring of both lung apices is noted. No suspicious nodules, masses, or consolidation  are seen.     IMPRESSION:  1. Mild scarring of both lung apices  2. No suspicious pulmonary nodules     FLUORO ESOPHAGRAM SINGLE-CONTRAST (NO AIR) performed on 12/19/2022 10:37 AM.     REASON FOR EXAM:  R13.10: Dysphagia  trouble with foods sticking in upper esophagus, hx of dilation  CONTRAST: Barium  FLUORO TIME: 1 min and 16 secs     RADIATION DOSE:     FT:  1 min and 16 secs       FINDINGS:  The oropharynx is normal, with no mass or diverticulum. The esophagus shows no mass or stricture. No gastroesophageal reflux is seen. 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     IMPRESSION:  Mild esophageal dysmotility    Labs:    No visits with results within 1 Day(s) from this visit.   Latest known visit with results is:   Hospital Outpatient Visit on 01/20/2023   Component Date Value    LA volume 01/20/2023 20.60     AV mean gradient 01/20/2023 3.00     Ao VTI 01/20/2023 25.40     Ao peak vel 01/20/2023 124.00     Aortic Valve Area by Con* 01/20/2023 1.88     Aortic Valve Area by Con* 01/20/2023 1.86     Ao root annulus 01/20/2023 2.60     Ao root annulus 01/20/2023 2.60     Inferior Vena Cava Diame* 01/20/2023 2.30     Inferior Vena Cava Diame* 01/20/2023 2.30     LVOT diameter 01/20/2023 1.60     Left Ventricular Outflow* 01/20/2023 115.00     AV LVOT peak gradient 01/20/2023 5.00     TDI 01/20/2023 19.00     TDI 01/20/2023 19.00     E/E' ratio 01/20/2023 4.80     LVOT stroke volume 01/20/2023 48.00     E wave decelartion time 01/20/2023 215.00     MV Peak A Vel 01/20/2023 58.70     MV Peak E Vel 01/20/2023 92.10  E/A ratio 01/20/2023 1.60     Pulmonic Valve Accelerat* 01/20/2023 158.00     RVDD 01/20/2023 2.80     RVDD 01/20/2023 2.80            There is no immunization history on file for this patient.         Vitals:   Vitals:    05/01/23 1606   BP: 111/87   Pulse: 65   Resp: 16   Temp: 36.2 C (97.1 F)   TempSrc: Tympanic   SpO2: 98%   Weight: 49 kg (108 lb)   Height: 1.626 m (5\' 4" )   BMI: 18.54           PHYSICAL  EXAMINATION:      Constitutional    General appearance of the patient: well groomed, no distress noted  Eyes:  Pupils are equal and reactive to light and accommodation, Pink Conjunctiva  Ears, Nose, Mouth, and Throat: nose appears normal  Neck: non tender, no masses, trachea midline  Lungs: breath sounds clear throughout with normal respiratory effort  CV:  Regular rate and rhythm, No murmur gallops or rubs, Normal PMI, no peripheral edema  Musc: Normal gait and station, no digital cyanosis or clubbing  Skin: Warm and Dry, No rash, lesion or ulcers. Normal turgor and temperature.  Psych:  Alert and oriented x3 with appropriate affect      ICD-10-CM    1. Shortness of breath  R06.02 CBC/DIFF     PULMONARY FUNCTION TESTING-ADULT     PULMONARY FUNCTION TESTING-ADULT      2. Allergies  T78.40XA RAST, HOUSE DUST MITE (D1) (DERMATOPHAGOIDES PTERONYSSINUS)     TOTAL IMMUNOGLOBULIN IGE     ALLERGEN, ALTERNARIA ALTERNATA     ALLERGEN, ASPERGILLUS FUMIGATUS     ALLERGEN, BERMUDA GRASS     ALLERGEN, COCKROACH (GERMAN)     ALLERGEN, COMMON RAGWEED (SHORT)     ALLERGEN, CAT PELT DANDER/EPITHELIUM     ALLERGEN, DERMAT FARINAE (MITES)     ALLERGEN, DOG DANDER     ALLERGEN, [place] TREE     ALLERGEN, [place] GRASS     ALLERGEN, [place]  BLUE GRASS (JUNE)     ALLERGEN, [place] TREE (WHITE AND RED)     ALLERGEN, PECAN TREE     ALLERGEN, [place] TREE      3. GERD (gastroesophageal reflux disease)  K21.9 CBC/DIFF      4. Ehlers-Danlos syndrome  Q79.60            Assessment & Plan    Holly Fritz is a 42 y.o., White female with a Dx of     (R06.02) Shortness of breath  (primary encounter diagnosis)  Plan: CBC/DIFF, PULMONARY FUNCTION TESTING-ADULT,         PULMONARY FUNCTION TESTING-ADULT        Patient has an underlying history of shortness of breath that is noted at times while walking up stairs or up an incline.  She also has coughing that is greater in the morning and productive for brown to clear sputum.  She has never had a PFT  completed.  She does have albuterol that she occasionally uses and does feel benefit from this.  PFT will be completed to evaluate for any obstructive or restrictive component.  CBC with diff will be obtained to evaluate hemoglobin and eosinophil count.  CT of chest was viewed and shows mild scarring in both apices.  There is some right upper lobe blebs noted in the apices.  If the  patient's PFT is normal we will move forward with a methacholine challenge to evaluate for bronchial airway hyperreactivity.  Continue albuterol  2 puffs every 4 hours as needed    (T78.40XA) Allergies  Plan: RAST, HOUSE DUST MITE (D1) (DERMATOPHAGOIDES         PTERONYSSINUS), TOTAL IMMUNOGLOBULIN IGE,         ALLERGEN, ALTERNARIA ALTERNATA, ALLERGEN,         ASPERGILLUS FUMIGATUS, ALLERGEN, [place] GRASS,        ALLERGEN, COCKROACH (GERMAN), ALLERGEN, COMMON         RAGWEED (SHORT), ALLERGEN, CAT PELT         DANDER/EPITHELIUM, ALLERGEN, DERMAT FARINAE         (MITES), ALLERGEN, DOG DANDER, ALLERGEN, [place]         TREE, ALLERGEN, J[place] GRASS, ALLERGEN,         [place]  BLUE GRASS (JUNE), ALLERGEN, [place] TREE         (WHITE AND RED), ALLERGEN, PECAN TREE,         ALLERGEN, [place] TREE        Patient has an underlying history of allergies.  She describes allergies to cats, dust, ragweed and a tree that she is unsure of.  Seasonal allergies are greater in the spring.  She did have formal allergy testing done more than 15 years ago.  Regional allergen panel and IgE level will be obtained.  She is currently staying in a basement that she feels has mold.    (K21.9) GERD (gastroesophageal reflux disease)  Plan: CBC/DIFF        Patient has an underlying history of multiple GI difficulties including recurrent vomiting, reflux and difficulty swallowing.  She does have some scarring in the apices.  At this time she feels her GI symptoms are well controlled and she is following with GI at [facility].    (Q79.60) Ehlers-Danlos  syndrome  Plan: Patient has an underlying history of Ehlers-Danlos and was diagnosed in [place]         Return in about 4 weeks (around 05/29/2023) for Testing.        Orders Placed This Encounter    CBC/DIFF    RAST, HOUSE DUST MITE (D1) (DERMATOPHAGOIDES PTERONYSSINUS)    TOTAL IMMUNOGLOBULIN IGE    ALLERGEN, ALTERNARIA ALTERNATA    ALLERGEN, ASPERGILLUS FUMIGATUS    ALLERGEN, [place] GRASS    ALLERGEN, COCKROACH (GERMAN)    ALLERGEN, COMMON RAGWEED (SHORT)    ALLERGEN, CAT PELT DANDER/EPITHELIUM    ALLERGEN, DERMAT FARINAE (MITES)    ALLERGEN, DOG DANDER    ALLERGEN, [place] TREE    ALLERGEN, JOH[place] GRASS    ALLERGEN, [place]  BLUE GRASS (JUNE)    ALLERGEN, [place] TREE (WHITE AND RED)    ALLERGEN, PECAN TREE    ALLERGEN, [place] TREE    PULMONARY FUNCTION TESTING-ADULT    PULMONARY FUNCTION TESTING-ADULT             The patient was given the opportunity to ask questions and those questions were answered to the patient's satisfaction. The patient was encouraged to call with any additional questions or concerns.     Discussed with patient effects and side effects of medications. Medication safety was discussed,    The patient was informed to contact the office within 7 business days if a message/lab results/referral/imaging results have not been conveyed to the patient.    I am scribing for, and in the presence of, Rushie Courier, CRNP, for services provided on  05/01/2023.  [address], [place]   [place], [place]      I personally performed the services described in this documentation, as scribed  in my presence, and it is both accurate and complete.    Rushie Courier, CRNP    This note may have been partially generated using MModal Fluency Direct system, and there may be some incorrect words, spellings, and punctuation that were not noted in checking the note before saving.

## 2023-05-01 NOTE — Progress Notes (Deleted)
 Patient has an underlying history of shortness of breath that is noted at times while walking up stairs or up an incline.  She also has coughing that is greater in the morning and productive for brown to clear sputum.  She has never had a PFT completed.  She does have albuterol that she occasionally uses and does feel benefit from this.  PFT will be completed to evaluate for any obstructive or restrictive component.  CBC with diff will be obtained to evaluate hemoglobin and eosinophil count.  CT of chest was viewed and shows mild scarring in both apices.  There is some right upper lobe blebs noted in the apices.  If the patient's PFT is normal we will move forward with a methacholine challenge to evaluate for bronchial airway hyperreactivity.    Continue albuterol 2 puffs every 4 hours as needed    Patient has an underlying history of allergies.  She describes allergies to cats, dust, ragweed and a tree that she is unsure of.  Seasonal allergies are greater in the spring.  She did have formal allergy testing done more than 15 years ago.  Regional allergen panel and IgE level will be obtained.  She is currently staying in a basement that she feels has mold.    Patient has an underlying history of multiple GI difficulties including recurrent vomiting, reflux and difficulty swallowing.  She does have some scarring in the apices.  At this time she feels her GI symptoms are well controlled and she is following with GI at [place]  [facility].    Patient has an underlying history of Ehlers-Danlos and was diagnosed in [place] .

## 2023-05-02 ENCOUNTER — Telehealth (RURAL_HEALTH_CENTER): Payer: Self-pay | Admitting: NURSE PRACTITIONER

## 2023-05-02 NOTE — Telephone Encounter (Signed)
 05/02/2023-LVM for return call to schedule her PFT and [facility]---BJR

## 2023-05-05 ENCOUNTER — Encounter (RURAL_HEALTH_CENTER): Payer: Self-pay | Admitting: NURSE PRACTITIONER

## 2023-05-08 ENCOUNTER — Ambulatory Visit (INDEPENDENT_AMBULATORY_CARE_PROVIDER_SITE_OTHER)

## 2023-05-08 ENCOUNTER — Ambulatory Visit: Attending: PHYSICIAN ASSISTANT | Admitting: PHYSICIAN ASSISTANT

## 2023-05-08 ENCOUNTER — Encounter (INDEPENDENT_AMBULATORY_CARE_PROVIDER_SITE_OTHER): Payer: Self-pay | Admitting: PHYSICIAN ASSISTANT

## 2023-05-08 ENCOUNTER — Other Ambulatory Visit: Payer: Self-pay

## 2023-05-08 VITALS — BP 104/68 | HR 82 | Temp 97.4°F | Resp 16 | Ht 64.0 in | Wt 108.6 lb

## 2023-05-08 DIAGNOSIS — M545 Low back pain, unspecified: Secondary | ICD-10-CM | POA: Insufficient documentation

## 2023-05-08 DIAGNOSIS — Q796 Ehlers-Danlos syndrome, unspecified: Secondary | ICD-10-CM | POA: Insufficient documentation

## 2023-05-08 DIAGNOSIS — M419 Scoliosis, unspecified: Secondary | ICD-10-CM | POA: Insufficient documentation

## 2023-05-08 NOTE — Progress Notes (Signed)
 [facility]  [address]  [place] [id number]  Operated by [facility]     Name: Holly Fritz MRN:  [id number]   Date of Birth: 1980/09/21 Age: 43 y.o.   Date: 05/08/2023  Time: 12:24     Provider: Cleaster Curie, PA-C  PCP: [place] Taylor Hott, NP    Reason for visit:   Chief Complaint              Lower Back Pain Right side lower back pain  X1 week             Vitals:   Vitals:    05/08/23 1138   BP: 104/68   Pulse: 82   Resp: 16   Temp: 36.3 C (97.4 F)   TempSrc: Tympanic   SpO2: 98%   Weight: 49.3 kg (108 lb 9.6 oz)   Height: 1.626 m (5\' 4" )   BMI: 18.64        History of Present Illness:  Holly Fritz is a 43 y.o. female presenting with complaints of right-sided low back pain.  Patient states that about 3 weeks ago she was helping clean her aunt's home.  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  She does have some numbness of the right gluteus.  She denies any pain radiating down the lower extremity.  She denies urinary symptoms such as dysuria.  She also denies hematuria, left-sided back pain or flank pain.  She denies urinary or bowel incontinence.  Denies fevers or recent injury.  Denies IV drug use.    Past Medical History:  Past Medical History:   Diagnosis Date    Autoimmune disorder (CMS HCC)     Awareness under anesthesia     Cervicocranial syndrome     Chiari I malformation (CMS HCC)     Chronic pain     Convergence insufficiency     Dysautonomia (CMS HCC)     Ehlers-Danlos syndrome     Endometriosis     Esophageal reflux     Headache     History of anesthesia complications     patient states make sure you give me enough medication to go to sleep and stay asleep or I will wake up during procedure    POTS (postural orthostatic tachycardia syndrome)     Scoliosis     Shortness of breath     Thyroid  nodule          Past Surgical History:  Past Surgical History:   Procedure Laterality Date    ESOPHAGEAL DILATION      HX  CHOLECYSTECTOMY      HX COLONOSCOPY      HX LAP CHOLECYSTECTOMY      HX MYOMECTOMY      HX WISDOM TEETH EXTRACTION      POSTERIOR FOSSA DECOMPRESSION      2017         Allergies:  Allergies   Allergen Reactions    Adhesive Rash    Doxycycline Rash     Agitation      Propranolol Hives/ Urticaria, Itching and Rash    Ciprofloxacin     Gabapentin  Other Adverse Reaction (Add comment)     Patient reports is "sensitive" to medication.    Levaquin [Levofloxacin]     Oxycodone      Patient reports "sensitivity" to medication.    Penicillins      unknown reaction when an infant -  HAS TOLERATED KEFLEX    Tapentadol      Patient reports "sensitivity" to medication.     Medications:  Current Outpatient Medications   Medication Sig    albuterol sulfate (PROVENTIL OR VENTOLIN OR PROAIR) 90 mcg/actuation Inhalation oral inhaler Take 2 Puffs by inhalation Every 4 hours as needed    aspirin-acetaminophen-caffeine (EXCEDRIN) 250-250-65 mg Oral Tablet Take 2 Tablets by mouth Every 6 hours as needed for Migraine    capsaicin  (ZOSTRIX) 0.025 % Cream Apply topically Twice daily    cetirizine  (ZYRTEC ) 10 mg Oral Tablet Take 1 Tablet (10 mg total) by mouth Twice daily    clobetasoL-gauze-silicone 0.05 %- 4" X 4" Apply externally Kit Apply topically    clonazePAM (KLONOPIN) 0.5 mg Oral Tablet Take 1 Tablet (0.5 mg total) by mouth Twice daily    cyclobenzaprine (FLEXERIL) 10 mg Oral Tablet Take 1 Tablet (10 mg total) by mouth Every 8 hours as needed for Muscle spasms    diphenhydrAMINE  (BENADRYL ) 25 mg Oral Capsule Take 1 Capsule (25 mg total) by mouth Once per day as needed for Itching    famotidine  (PEPCID ) 40 mg Oral Tablet Take 1 Tablet (40 mg total) by mouth Twice per day as needed Indications: heartburn    hydrOXYzine  HCL (ATARAX ) 25 mg Oral Tablet Take 2 Tablets (50 mg total) by mouth Three times a day    lamoTRIgine (LAMICTAL) 100 mg Oral Tablet Take 1 Tablet (100 mg total) by mouth Twice daily    lidocaine  (LIDODERM ) 5 % Adhesive  Patch, Medicated APPLY 1 PATCH BY TOPICAL ROUTE ONCE DAILY (MAY WEAR UP TO 12HOURS.)    lidocaine  (XYLOCAINE ) 5 % Ointment APPLY TO AFFECTED AREA 1-4 TIMES DAILY AS NEEDED    omeprazole  (PRILOSEC) 40 mg Oral Capsule, Delayed Release(E.C.) Take 1 Capsule (40 mg total) by mouth Once a day 30 min before breakfast (Patient not taking: Reported on 05/01/2023)    ondansetron  (ZOFRAN  ODT) 4 mg Oral Tablet, Rapid Dissolve Take 1 Tab (4 mg total) by mouth Every 8 hours as needed for Nausea/Vomiting    sertraline (ZOLOFT) 50 mg Oral Tablet Take 1 Tablet (50 mg total) by mouth Daily (Patient not taking: Reported on 05/01/2023)    sodium chloride  (SALINE NOSE NASL) by Nasal route    Tacrolimus 0.1 % Ointment Apply topically Twice daily    traZODone  (DESYREL ) 50 mg Oral Tablet Take 1 Tablet (50 mg total) by mouth Every night as needed for Insomnia    TURMERIC ORAL Take by mouth Once a day     Family History:  Family Medical History:       Problem Relation (Age of Onset)    COPD Paternal Grandmother    Congestive Heart Failure Maternal Grandmother    Hypertension (High Blood Pressure) Mother    No Known Problems Father, Sister, Maternal Grandfather, Paternal Grandfather, Daughter, Son, Maternal Aunt, Maternal Uncle, Paternal Aunt, Paternal Uncle, Other    Non-Hodgkin's lymphoma Maternal Grandmother    Primary Brain tumor Mother            Social History:  Social History     Socioeconomic History    Marital status: Legally Separated    Number of children: 0   Occupational History    Occupation: disability    Occupation: Engineer, civil (consulting) farm   Tobacco Use    Smoking status: Every Day     Current packs/day: 0.50     Average packs/day: 0.5 packs/day for 25.2 years (12.6 ttl pk-yrs)  Types: Cigarettes     Start date: 2000     Passive exposure: Current (smoker)    Smokeless tobacco: Never    Tobacco comments:     1.5 ppd   Vaping Use    Vaping status: Never Used   Substance and Sexual Activity    Alcohol use: Not Currently    Drug use: Yes      Types: Marijuana     Comment: daily   Other Topics Concern    Ability to Walk 1 Flight of Steps without SOB/CP Yes    Ability to Walk 2 Flight of Steps without SOB/CP Yes   Social History Narrative    Respiratory history: None    Smoking history: Patient is a half a pack a day smoker since 2000. She also smokes marijuana 2-3 times a day.    Significant secondhand smoke exposure: Mother smoked    Smoking in the home: None    Occupational history: Disabled.    Occupational exposures: No exposures to chemicals, dust, pesticides, fumes or asbestos    The patient denies exposure to farms or raw sewage. Not raising wild birds. No hunting and skinning wild animals.    The patient has not been out country recently or [employer]:     [employer]: Chief Financial Officer: Forensic psychologist: Yes    Humidifier: No    Water: [place]    Pets in the home: Not currently     Social Determinants of Health     Financial Resource Strain: Low Risk  (05/01/2023)    Financial Resource Strain     SDOH Financial: No   Transportation Needs: Low Risk  (05/01/2023)    Transportation Needs     SDOH Transportation: No   Social Connections: Low Risk  (05/01/2023)    Social Connections     SDOH Social Isolation: 5 or more times a week   Intimate Partner Violence: Low Risk  (05/01/2023)    Intimate Partner Violence     SDOH Domestic Violence: No   Housing Stability: Low Risk  (05/01/2023)    Housing Stability     SDOH Housing Situation: I have housing.     SDOH Housing Worry: No       Review of Systems:  All pertinent Review of System as address in the HPI.    Physical Exam:  General Appearance: Alert and oriented; No acute distress; Cooperative; Appears stated age  Head: Normocephalic; Atraumatic  Eyes: PERRL; EOMI; Conjunctivae clear  Ears: External auditory canals clear; Tympanic membranes intact with normal light reflex  Nose: Nares patent; No drainage appreciated  Throat: No pharyngeal erythema or tonsillar exudate; Teeth and gingivae  with no abnormalities appreciated  Neck: Supple; Full ROM; No lymphadenopathy; No tenderness to palpation of spinous processes  Lungs: Clear to auscultation in all lung fields; No rhonchi; No wheezes    Heart: Regular rate and rhythm; No murmurs appreciated  Abdomen: Bowel sounds (+); No distention; No tenderness to palpation  Extremities: Radial and dorsalis pedis pulses (+); No edema  Musculoskeletal: No deformities; No swelling; on exam there is no point tenderness to palpation of the lower back.  There is no redness, warmth or swelling of the spine.  Straight leg raise test negative bilaterally.  Neurological: Alert and oriented, neurovascularly intact to bilateral lower extremities, L2 through L5 nerve roots with appropriate sensation    There are no exam notes on file for this visit.  Assessment:    ICD-10-CM    1. Acute right-sided low back pain without sciatica  M54.50 XR LUMBAR SPINE AP/OBLIQUES/LAT/SPOT           Plan:  Orders Placed This Encounter    XR LUMBAR SPINE AP/OBLIQUES/LAT/SPOT     Patient presenting today with right-sided low back pain.  Patient with a history of Ehlers-Danlos syndrome and scoliosis.  Patient had an x-ray completed which does not show any acute findings.  There is no sign of spondylolisthesis, lumbar injury or significant arthritis of the spine.  At this time patient is leaving here to be seen by her pain management provider he will have further evaluation of the lumbar spine.  I do believe she is experiencing symptoms of right-sided lumbar radiculopathy, sciatica no acute neurological deficits on exam, safe for discharge.  Offered steroid pack however she is receiving cortisone injections today with pain management.  No follow-ups on file.    Cleaster Curie, PA-C     Portions of this note may be dictated using voice recognition software or a dictation service. Variances in spelling and vocabulary are possible and unintentional. Not all errors are caught/corrected. Please  notify the [facility] if any discrepancies are noted or if the meaning of any statement is not clear.

## 2023-05-20 ENCOUNTER — Other Ambulatory Visit: Payer: Self-pay

## 2023-05-20 ENCOUNTER — Inpatient Hospital Stay
Admission: RE | Admit: 2023-05-20 | Discharge: 2023-05-20 | Disposition: A | Discharge status: Home / Self Care | Payer: Self-pay | Source: Ambulatory Visit | Attending: Family | Admitting: Family

## 2023-05-20 ENCOUNTER — Encounter (INDEPENDENT_AMBULATORY_CARE_PROVIDER_SITE_OTHER): Payer: Self-pay | Admitting: Family

## 2023-05-20 DIAGNOSIS — R1013 Epigastric pain: Secondary | ICD-10-CM | POA: Insufficient documentation

## 2023-05-20 DIAGNOSIS — R634 Abnormal weight loss: Secondary | ICD-10-CM | POA: Insufficient documentation

## 2023-05-20 DIAGNOSIS — R112 Nausea with vomiting, unspecified: Secondary | ICD-10-CM | POA: Insufficient documentation

## 2023-05-26 ENCOUNTER — Telehealth (HOSPITAL_COMMUNITY): Payer: Self-pay

## 2023-05-26 NOTE — Telephone Encounter (Signed)
 COVID screening questions reviewed. Reviewed pre procedure instructions, current COVID guidelines and visitation policies. All questions answered. For any questions regarding stress testing please call [phone].  Verbalized understanding of NPO after midnight on day of testing, may take medications in am with sips of water, may brush teeth. Instructed no candy, gum, mints or nicotine prior to abdominal ultrasound.

## 2023-05-28 ENCOUNTER — Telehealth (HOSPITAL_COMMUNITY): Payer: Self-pay | Admitting: NURSE PRACTITIONER

## 2023-05-28 NOTE — Telephone Encounter (Signed)
 Spoke with pt on 05/28/23 [facility] does not cover CPT 94070 i got out of pocket pricing but pt is unable to pay at this moment pt did want to keep regular PFT test on since this is covered with insurance advised patient to contact provider to see if she would like to order another test in place of this one.

## 2023-05-29 ENCOUNTER — Ambulatory Visit
Admission: RE | Admit: 2023-05-29 | Discharge: 2023-05-29 | Disposition: A | Discharge status: Home / Self Care | Source: Ambulatory Visit | Attending: Student in an Organized Health Care Education/Training Program | Admitting: Student in an Organized Health Care Education/Training Program

## 2023-05-29 ENCOUNTER — Ambulatory Visit (HOSPITAL_COMMUNITY): Payer: Self-pay

## 2023-05-29 ENCOUNTER — Other Ambulatory Visit: Payer: Self-pay

## 2023-05-29 ENCOUNTER — Encounter (HOSPITAL_COMMUNITY)
Admission: RE | Disposition: A | Discharge status: Home / Self Care | Payer: Self-pay | Source: Ambulatory Visit | Attending: Student in an Organized Health Care Education/Training Program

## 2023-05-29 DIAGNOSIS — R1013 Epigastric pain: Secondary | ICD-10-CM

## 2023-05-29 DIAGNOSIS — R634 Abnormal weight loss: Secondary | ICD-10-CM

## 2023-05-29 DIAGNOSIS — R1310 Dysphagia, unspecified: Secondary | ICD-10-CM | POA: Insufficient documentation

## 2023-05-29 DIAGNOSIS — R1110 Vomiting, unspecified: Secondary | ICD-10-CM | POA: Insufficient documentation

## 2023-05-29 SURGERY — MANOMETRY ESOPHAGEAL
Anesthesia: Local (Nurse-Monitored) | Site: Mouth | Wound class: Clean Contaminated Wounds-The respiratory, GI, Genital, or urinary

## 2023-05-29 MED ORDER — LIDOCAINE 2 % MUCOSAL JELLY IN APPLICATOR
Freq: Once | Status: AC
Start: 2023-05-29 — End: 2023-05-29
  Administered 2023-05-29: 2 mL via TOPICAL
  Filled 2023-05-29: qty 11

## 2023-05-29 MED ORDER — LIDOCAINE 2 % MUCOSAL JELLY IN APPLICATOR
Status: AC
Start: 2023-05-29 — End: 2023-05-29
  Filled 2023-05-29: qty 11

## 2023-05-29 SURGICAL SUPPLY — 2 items
SOL WARMING DRAPE 44IN X 44IN RECTANGULAR FRAME (DRAPE/PACKS/SHEETS/OR TOWEL) IMPLANT
SOL WARMING DRAPES RECTANGULAR FRAME EXTENDED 60IN X 44IN (DRAPE/PACKS/SHEETS/OR TOWEL) IMPLANT

## 2023-05-29 NOTE — Discharge Instructions (Signed)
 SURGICAL DISCHARGE INSTRUCTIONS      Dr. Jeannine Boga will read your esophageal manometry that was performed today at the [facility].      [facility]:  Monday through Friday from 6 a.m. - 7 p.m.: [phone]  Between 7 p.m. - 6 a.m., weekends and holidays:  Call [facility] at [phone] or [phone].     PLEASE SEE WRITTEN HANDOUTS AS DISCUSSED BY YOUR NURSE:  Esophageal Manometry, Nosebleed     ANESTHESIA INFORMATION   LOCAL ANESTHETIC:  You have receieved a local anesthetic, the effects should disappear in a few hours.     POST-PROCEDURE INFORMATION           No forceful nose blowing.          You may experience a sore throat over the next couple of days. This is normal. You can use throat lozenges, hot tea, salt water gargles, etc. to soothe symptoms. If the soreness does not go away after 2-3 days, call your primary care physician.          You may experience a slight nose bleed. If the bleeding cannot be stopped or if you vomit up blood, go to the closest emergency room immediately.          If you experience any difficulty breathing, chest pain, persistent nausea or vomiting, go to the closest emergency room immediately.      CONTACT INFORMATION    [phone] or [phone]  You may also ask to have the GI doctor on call paged. They are available to you 24 hours a day.     MANOMETRY RESULTS    For manometry results, a GI clinical coordinator will contact you and will fax the report to your referring physician. If you any questions, please call [phone] between the hours of 8:00am and 4:00pm, Monday to Friday and ask for [facility].      FOLLOW-UP APPOINTMENTS   Please call patient services at [phone] or [phone] to schedule a date / time of return. They are open Monday - Friday from 7:30 am - 5:00 pm.

## 2023-05-31 DIAGNOSIS — R1013 Epigastric pain: Secondary | ICD-10-CM

## 2023-05-31 DIAGNOSIS — R634 Abnormal weight loss: Secondary | ICD-10-CM

## 2023-06-02 ENCOUNTER — Other Ambulatory Visit: Payer: Self-pay

## 2023-06-02 ENCOUNTER — Ambulatory Visit (HOSPITAL_COMMUNITY)

## 2023-06-02 ENCOUNTER — Ambulatory Visit (HOSPITAL_COMMUNITY): Payer: Self-pay

## 2023-06-02 ENCOUNTER — Ambulatory Visit
Admission: RE | Admit: 2023-06-02 | Discharge: 2023-06-02 | Disposition: A | Discharge status: Home / Self Care | Payer: Self-pay | Source: Ambulatory Visit | Attending: NURSE PRACTITIONER

## 2023-06-02 DIAGNOSIS — T7840XA Allergy, unspecified, initial encounter: Secondary | ICD-10-CM | POA: Insufficient documentation

## 2023-06-02 DIAGNOSIS — K219 Gastro-esophageal reflux disease without esophagitis: Secondary | ICD-10-CM

## 2023-06-02 DIAGNOSIS — R0602 Shortness of breath: Secondary | ICD-10-CM | POA: Insufficient documentation

## 2023-06-02 LAB — CBC WITH DIFF
BASOPHIL #: <0.04 10*3/uL (ref <=0.20)
BASOPHIL %: 0.6 %
EOSINOPHIL #: 0.1 10*3/uL (ref <=0.50)
EOSINOPHIL %: 1.9 %
HCT: 41.8 % (ref 34.8–46.0)
HGB: 13.7 g/dL (ref 11.5–16.0)
IMMATURE GRANULOCYTE #: <0.04 10*3/uL (ref <0.10)
IMMATURE GRANULOCYTE %: 0.2 % (ref 0.0–1.0)
LYMPHOCYTE #: 1.79 10*3/uL (ref 1.00–4.80)
LYMPHOCYTE %: 33.2 %
MCH: 30 pg (ref 26.0–32.0)
MCHC: 32.8 g/dL (ref 31.0–35.5)
MCV: 91.5 fL (ref 78.0–100.0)
MONOCYTE #: 0.44 10*3/uL (ref 0.20–1.10)
MONOCYTE %: 8.2 %
MPV: 8.4 fL — ABNORMAL LOW (ref 8.7–12.5)
NEUTROPHIL #: 3.02 10*3/uL (ref 1.50–7.70)
NEUTROPHIL %: 55.9 %
PLATELETS: 358 10*3/uL (ref 150–400)
RBC: 4.57 10*6/uL (ref 3.85–5.22)
RDW-CV: 13.8 % (ref 11.5–15.5)
WBC: 5.4 10*3/uL (ref 3.7–11.0)

## 2023-06-04 LAB — RAST, MEADOW GRASS (KENTUCKY BLUE) (G8)
MEADOW GRASS (KENTUCKY BLUE) (G8) INTERPRETATIOn: UNDETECTABLE
MEADOW GRASS (KENTUCKY BLUE) (G8): <0.1 kU/L (ref <0.10)

## 2023-06-04 LAB — RAST, ALTERNARIA ALTERNATA (M6)
ALTERNARIA TENUIS (M6) INTERPRETATION: UNDETECTABLE
ALTERNARIA TENUIS (M6): <0.1 kU/L (ref <0.10)

## 2023-06-04 LAB — RAST, BERMUDA GRASS (G2)
BERMUDA GRASS (G2) INTERPRETATION: UNDETECTABLE
BERMUDA GRASS (G2): <0.1 kU/L (ref <0.10)

## 2023-06-04 LAB — RAST, JOHNSON GRASS (G10)
JOHNSON GRASS (G10) INTERPRETATION: UNDETECTABLE
JOHNSON GRASS (G10): <0.1 kU/L (ref <0.10)

## 2023-06-04 LAB — RAST, PECAN, HICKORY (T22)
PECAN, HICKORY (T22) INTERPRETATION: UNDETECTABLE
PECAN, HICKORY (T22): <0.1 kU/L (ref <0.10)

## 2023-06-04 LAB — RAST, OAK (T7)
OAK (T7) INTERPRETATION: UNDETECTABLE
OAK (T7): <0.1 kU/L (ref <0.10)

## 2023-06-04 LAB — RAST, HOUSE DUST MITE (D2) (DERMATOPHAGOIDES FARINAE): HOUSE DUST MITE (D2) (DERMATOPHAGOIDES FARINAE): 0.34 kU/L — ABNORMAL HIGH (ref <0.10)

## 2023-06-04 LAB — RAST, DOG DANDER (E5)
DOG DANDER (E5) INTERPRETATION: UNDETECTABLE
DOG DANDER (E5): <0.1 kU/L (ref <0.10)

## 2023-06-04 LAB — RAST, ASPERGILLUS FUMIGATUS (M3)
ASPERGILLUS FUMIGATUS (M3) INTERPRETATION: UNDETECTABLE
ASPERGILLUS FUMIGATUS (M3): <0.1 kU/L (ref <0.10)

## 2023-06-04 LAB — RAST, WALNUT TREE (T10)
WALNUT TREE (T10) INTERPRETATION: UNDETECTABLE
WALNUT TREE (T10): <0.1 kU/L (ref <0.10)

## 2023-06-04 LAB — RAST, COCKROACH, GERMAN (I6)
COCKROACH, GERMAN (I6) INTERPRETATION: UNDETECTABLE
COCKROACH, GERMAN (I6): <0.1 kU/L (ref <0.10)

## 2023-06-04 LAB — RAST, COMMON RAGWEED (W1)
COMMON RAGWEED (W1) INTERPRETATION: UNDETECTABLE
COMMON RAGWEED (W1): <0.1 kU/L (ref <0.10)

## 2023-06-04 LAB — RAST, ELM (T8)
ELM (T8) INTERPRETATION: UNDETECTABLE
ELM (T8): <0.1 kU/L (ref <0.10)

## 2023-06-04 LAB — IGE, TOTAL IMMUNOGLOBULIN E: IgE: 13.2 kU/L

## 2023-06-04 LAB — RAST, HOUSE DUST MITE (D1) (DERMATOPHAGOIDES PTERONYSSINUS): HOUSE DUST MITE (D1) (DERMATOPHAGOIDES PTERONYSSINUS): 0.49 kU/L — ABNORMAL HIGH (ref <0.10)

## 2023-06-04 LAB — RAST, CAT DANDER (E1)
CAT DANDER (E1) INTERPRETATION: UNDETECTABLE
CAT DANDER (E1): <0.1 kU/L (ref <0.10)

## 2023-06-05 ENCOUNTER — Ambulatory Visit (INDEPENDENT_AMBULATORY_CARE_PROVIDER_SITE_OTHER): Payer: Self-pay | Admitting: Family

## 2023-06-10 ENCOUNTER — Ambulatory Visit: Payer: Self-pay | Attending: Family | Admitting: Family

## 2023-06-10 ENCOUNTER — Other Ambulatory Visit: Payer: Self-pay

## 2023-06-10 ENCOUNTER — Encounter (INDEPENDENT_AMBULATORY_CARE_PROVIDER_SITE_OTHER): Payer: Self-pay | Admitting: Family

## 2023-06-10 VITALS — BP 95/67 | HR 77 | Temp 97.9°F | Ht 64.0 in | Wt 110.2 lb

## 2023-06-10 DIAGNOSIS — K3184 Gastroparesis: Secondary | ICD-10-CM | POA: Insufficient documentation

## 2023-06-10 DIAGNOSIS — E611 Iron deficiency: Secondary | ICD-10-CM | POA: Insufficient documentation

## 2023-06-10 DIAGNOSIS — R197 Diarrhea, unspecified: Secondary | ICD-10-CM | POA: Insufficient documentation

## 2023-06-10 DIAGNOSIS — R140 Abdominal distension (gaseous): Secondary | ICD-10-CM | POA: Insufficient documentation

## 2023-06-10 MED ORDER — OMEPRAZOLE 40 MG CAPSULE,DELAYED RELEASE
40.0000 mg | DELAYED_RELEASE_CAPSULE | Freq: Every day | ORAL | 1 refills | Status: DC
Start: 2023-06-10 — End: 2023-10-21

## 2023-06-10 NOTE — Progress Notes (Addendum)
 GASTROENTEROLOGY/HEPATOLOGY, PHYSICIAN OFFICE CENTER  [address]  [place] [address]  Operated by [facility]     Name: Holly Fritz MRN:  [id number]   Date: 06/10/2023 Age: 43 y.o.       Requesting Physician: [place], [place] Taylor, NP  Source: Patient and review of records.     History of Present Illness  Holly Fritz is a 43 y.o. female recently known to me for gastroparesis and dysphagia.    Interval History:   Onset of symptoms around 2015.   Prev followed with Dr. Babs Bolster, [facility], for [place] Esophagus and Colonic Dysmotility.  Was enrolled in GI pain program at one point.  Had dietician and TNP 2017-2018.   Initially dizziness and presyncopal during meals.   S/P Chole, 2020.  Had relief of GI symptoms for about 1 yr.   Reports having unremarkable colonoscopy around 2018.  No records presently available.   Pt is under a significant amount of stress (mom recently passed, caregiver for g-ma, recently divorced.) In grief counseling. Good support system. GI symptoms preceded stressors.      Summary of prior testing:  11-24-15 NM Esophageal Motility: pos for delayed esophageal emptying, neg for delayed gastric emptying, neg for delayed small bowel emptying, pos for delayed colonic emptying diffusely and in colon and rectosigmoid (results reviewed on Pts phone)  Oct 2024 XR KUB:  Unremarkable  Oct 2024 Elastase: normal   Nov 2024 BAS: no reflux, mild esophageal dysmotility   Dec 2024 EGD:  Normal esophagus, dilated.  Normal stomach, normal ampulla, normal duodenal.  Path:  Squamous mucosa with reactive changes proximal and distal esophagus  Thersa 2025 Abd Duplex: normal  Lenzie 2025 Gastric Emptying Study: T 1/2 time 137 min, mild delayed emptying   Tanice 2025 SIBO breath test: results pending   Kandis 2025 Esophageal Manometry: results pending      Today:   Continues to have post prandial upper abd pain, fullness, swelling, bloating and sometimes N/V within 30 min of  eating.   Often eats very little through the day and more in evening.   Mentions choosing cheeseburger, ice cream, ribs.   Struggles keeping down protein shake.   C/O dizziness and air hunger.   Met with dietician Keyser in the past for wt loss, but not since Dx of Gastroparesis.   Poor tolerance to oral iron.  Requests labs rechecked today.   Recent CBC normal.   Loose stool up to 10 x per day, often post prandial but will go without eating as well.   Uses THC each morning "2-3 hits" to abort diarrhea.     Past History  Current Outpatient Medications   Medication Sig    albuterol sulfate (PROVENTIL OR VENTOLIN OR PROAIR) 90 mcg/actuation Inhalation oral inhaler Take 2 Puffs by inhalation Every 4 hours as needed    aspirin-acetaminophen-caffeine (EXCEDRIN) 250-250-65 mg Oral Tablet Take 2 Tablets by mouth Every 6 hours as needed for Migraine    capsaicin  (ZOSTRIX) 0.025 % Cream Apply topically Twice daily    cetirizine  (ZYRTEC ) 10 mg Oral Tablet Take 1 Tablet (10 mg total) by mouth Twice daily    clobetasoL-gauze-silicone 0.05 %- 4" X 4" Apply externally Kit Apply topically    clonazePAM (KLONOPIN) 0.5 mg Oral Tablet Take 1 Tablet (0.5 mg total) by mouth Twice daily    cyclobenzaprine (FLEXERIL) 10 mg Oral Tablet Take 1 Tablet (10 mg total) by mouth Every 8 hours as needed for Muscle spasms  diphenhydrAMINE  (BENADRYL ) 25 mg Oral Capsule Take 1 Capsule (25 mg total) by mouth Once per day as needed for Itching    famotidine  (PEPCID ) 40 mg Oral Tablet Take 1 Tablet (40 mg total) by mouth Twice per day as needed Indications: heartburn    hydrOXYzine  HCL (ATARAX ) 25 mg Oral Tablet Take 2 Tablets (50 mg total) by mouth Three times a day    lamoTRIgine (LAMICTAL) 100 mg Oral Tablet Take 1 Tablet (100 mg total) by mouth Twice daily    lidocaine  (LIDODERM ) 5 % Adhesive Patch, Medicated APPLY 1 PATCH BY TOPICAL ROUTE ONCE DAILY (MAY WEAR UP TO 12HOURS.)    lidocaine  (XYLOCAINE ) 5 % Ointment APPLY TO AFFECTED AREA 1-4 TIMES  DAILY AS NEEDED    omeprazole  (PRILOSEC) 40 mg Oral Capsule, Delayed Release(E.C.) Take 1 Capsule (40 mg total) by mouth Once a day 30 min before breakfast (Patient not taking: Reported on 05/01/2023)    ondansetron  (ZOFRAN  ODT) 4 mg Oral Tablet, Rapid Dissolve Take 1 Tab (4 mg total) by mouth Every 8 hours as needed for Nausea/Vomiting    sertraline (ZOLOFT) 50 mg Oral Tablet Take 1 Tablet (50 mg total) by mouth Daily (Patient not taking: Reported on 05/01/2023)    sodium chloride  (SALINE NOSE NASL) by Nasal route    Tacrolimus 0.1 % Ointment Apply topically Twice daily    traZODone  (DESYREL ) 50 mg Oral Tablet Take 1 Tablet (50 mg total) by mouth Every night as needed for Insomnia    TURMERIC ORAL Take by mouth Once a day     Allergies   Allergen Reactions    Adhesive Rash    Doxycycline Rash     Agitation      Propranolol Hives/ Urticaria, Itching and Rash    Ciprofloxacin     Gabapentin  Other Adverse Reaction (Add comment)     Patient reports is "sensitive" to medication.    Levaquin [Levofloxacin]     Oxycodone      Patient reports "sensitivity" to medication.    Penicillins      unknown reaction when an infant - HAS TOLERATED KEFLEX    Tapentadol      Patient reports "sensitivity" to medication.     Past Medical History:   Diagnosis Date    Autoimmune disorder (CMS HCC)     Awareness under anesthesia     Cervicocranial syndrome     Chiari I malformation (CMS HCC)     Chronic pain     Convergence insufficiency     Dysautonomia (CMS HCC)     Ehlers-Danlos syndrome     Endometriosis     Esophageal reflux     Headache     History of anesthesia complications     patient states make sure you give me enough medication to go to sleep and stay asleep or I will wake up during procedure    POTS (postural orthostatic tachycardia syndrome)     Scoliosis     Shortness of breath     Thyroid  nodule          Past Surgical History:   Procedure Laterality Date    ESOPHAGEAL DILATION      HX CHOLECYSTECTOMY      HX COLONOSCOPY       HX LAP CHOLECYSTECTOMY      HX MYOMECTOMY      HX WISDOM TEETH EXTRACTION      POSTERIOR FOSSA DECOMPRESSION      2017  Family History  Family Medical History:       Problem Relation (Age of Onset)    COPD Paternal Grandmother    Congestive Heart Failure Maternal Grandmother    Hypertension (High Blood Pressure) Mother    No Known Problems Father, Sister, Maternal Grandfather, Paternal Grandfather, Daughter, Son, Maternal Aunt, Maternal Uncle, Paternal Aunt, Paternal Uncle, Other    Non-Hodgkin's lymphoma Maternal Grandmother    Primary Brain tumor Mother            Social History  Social History     Socioeconomic History    Marital status: Legally Separated    Number of children: 0   Occupational History    Occupation: disability    Occupation: Engineer, civil (consulting) farm   Tobacco Use    Smoking status: Every Day     Current packs/day: 0.50     Average packs/day: 0.5 packs/day for 25.3 years (12.7 ttl pk-yrs)     Types: Cigarettes     Start date: 2000     Passive exposure: Current (smoker)    Smokeless tobacco: Never    Tobacco comments:     1.5 ppd   Vaping Use    Vaping status: Never Used   Substance and Sexual Activity    Alcohol use: Not Currently    Drug use: Yes     Types: Marijuana     Comment: daily   Other Topics Concern    Ability to Walk 1 Flight of Steps without SOB/CP Yes    Ability to Walk 2 Flight of Steps without SOB/CP Yes   Social History Narrative    Respiratory history: None    Smoking history: Patient is a half a pack a day smoker since 2000. She also smokes marijuana 2-3 times a day.    Significant secondhand smoke exposure: Mother smoked    Smoking in the home: None    Occupational history: Disabled.    Occupational exposures: No exposures to chemicals, dust, pesticides, fumes or asbestos    The patient denies exposure to farms or raw sewage. Not raising wild birds. No hunting and skinning wild animals.    The patient has not been out country recently or [employer]:     [employer]: Secretary/administrator: Forensic psychologist: Yes    Humidifier: No    Water: [place]    Pets in the home: Not currently     Social Determinants of Health     Financial Resource Strain: Low Risk  (05/01/2023)    Financial Resource Strain     SDOH Financial: No   Transportation Needs: Low Risk  (05/01/2023)    Transportation Needs     SDOH Transportation: No   Social Connections: Low Risk  (05/01/2023)    Social Connections     SDOH Social Isolation: 5 or more times a week   Intimate Partner Violence: Low Risk  (05/01/2023)    Intimate Partner Violence     SDOH Domestic Violence: No   Housing Stability: Low Risk  (05/01/2023)    Housing Stability     SDOH Housing Situation: I have housing.     SDOH Housing Worry: No     Review of Systems  As detailed in HPI.  All other systems negative.     Examination    Vitals:    06/10/23 1431   BP: 95/67   Pulse: 77   Temp: 36.6 C (97.9 F)   TempSrc:  Thermal Scan   SpO2: 96%   Weight: 50 kg (110 lb 3.7 oz)   Height: 1.626 m (5\' 4" )   BMI: 18.92           Body mass index is 18.92 kg/m.     General: No acute distress.  Nontoxic appearance.    Neuro: Awake, alert, and oriented X 3.    HEENT: Normocephalic, atraumatic. Sclera non-icteric. Conjunctiva pink.     Neck: Supple and without lymphadenopathy.    Lungs: All fields clear to auscultation.  No cough or dyspnea.   Heart: Regular rate and rhythm. No murmur, gallop, click, or rub.   Abd: Soft, nondistended, nontender.  Bowel sounds positive all quadrents.  No organomegaly.   Ext: Without edema and cyanosis.   Skin: Uniformly pink and without rash, jaundice, or bruising.    Psych: Alert and pleasant.  Stable mood.       Impression / Recommendations:  Very pleasant 43 y/o female recently known to me for Gastroparesis and Dysphagia.     Dysphagia  - recent EGD unremarkable for etiology.  No significant relief with empiric dilation   - esophageal manometry completed.  Results pending.   - resume omeprazole  40 mg daily, 30 minutes prior to  breakfast.  Open capsule, take contents in applesauce.  - cont famotidine  40 mg b.i.d. PRN  - continue to avoid NSAIDs, tobacco, alcohol  - cut food into small pieces, chew thoroughly, drink liquids with meals, and remain upright during and 30 min after meals .     Gastroparesis   - Counseled on recent Dx   - Some concern for rumination syndrome and food insecurity, but Pt not yet ready to pursue   - Advised to avoid medications that could delay gastric emptying such as narcotics and anticholinergics.   - Advised specific lifestyle instructions and offered f/u with dietician. (Written instructions and referral placed)   - Eat small frequent meals (4 - 5 per day) low in fat and nondigestible fiber (fresh fruits and vegetables.)    - Soluble fiber (oat bran, barley, nuts, seeds, beans, lentils, peas, some fruits and vegetables, and psyllium) okay.    - Drink liquids with meals.   - Avoid carbonated beverages which can aggravate gaseous distention.   - Avoid tobacco and alcohol which as they can decrease antral contractility and delay gastric emptying.   - When symptoms are most severe, consume blended or liquid nutrition (Boost, Ensure, Carnation, home made smoothie, ect.)   - Referral to Acupuncture for symptom relief.   - we discussed risk vs benefit of prokinetic, metoclopramide , pending QT interval  < 470.  Pt will first discuss with Behavioral Health Specialist, stating she had genetic testing previously that indicated any mediation affecting serotonin levels may be unsafe for her   - Will continue to assess and treat GERD symptoms aggravated by gastroparesis.   - We dicussed Buspar vs mirtazepine for gastric accomodation but Pt reports poor reaction to these in the past.   - SIBO breath test ordered - results pending   - Pt interested in meeting with Dr. Carline Cheng, regarding potential candidacy for G-POEM      Orders Placed This Encounter    IRON TRANSFERRIN AND TIBC    Refer to Dietician Adult POC    Refer  to  [facility]    omeprazole  (PRILOSEC) 40 mg Oral Capsule, Delayed Release(E.C.)     RTC  in 3-6 months.     The patient was  given ample opportunity to ask questions and those questions were answered to the patient's satisfaction. Explanation and counseling provided specific to medications, labs, imaging, endoscopy, as appropriate per patient.  The patient was encouraged to be involved in their own care.  The patient was told to contact me with any additional questions or concerns.  I instructed the patient to use [facility] for messages, or call the office.   Khushboo Chuck, APRN,NP-C   04/18/2023, 08:10    Portions of this note may be dictated using MModal Fluency. Parts of this patient's chart may be completed in a retrospective fashion due to simultaneous direct patient care activities.  Variances in spelling and vocabulary are possible and unintentional. Not all errors are caught/corrected. Please notify the [facility] if any discrepancies are noted or if the meaning of any statement is not clear.

## 2023-06-13 ENCOUNTER — Encounter (INDEPENDENT_AMBULATORY_CARE_PROVIDER_SITE_OTHER): Payer: Self-pay

## 2023-06-16 ENCOUNTER — Encounter (INDEPENDENT_AMBULATORY_CARE_PROVIDER_SITE_OTHER): Payer: Self-pay | Admitting: DIETICIAN-REGISTERED

## 2023-06-16 ENCOUNTER — Ambulatory Visit (INDEPENDENT_AMBULATORY_CARE_PROVIDER_SITE_OTHER): Admitting: DIETICIAN-REGISTERED

## 2023-06-16 ENCOUNTER — Other Ambulatory Visit: Payer: Self-pay

## 2023-06-16 ENCOUNTER — Ambulatory Visit: Attending: Family | Admitting: DIETICIAN-REGISTERED

## 2023-06-16 DIAGNOSIS — Z713 Dietary counseling and surveillance: Secondary | ICD-10-CM | POA: Insufficient documentation

## 2023-06-16 DIAGNOSIS — K3184 Gastroparesis: Secondary | ICD-10-CM | POA: Insufficient documentation

## 2023-06-16 NOTE — Progress Notes (Unsigned)
 TELEMEDICINE DOCUMENTATION:    Patient Location:  MyChart video visit from [facility]      Patient/family aware of provider location:  yes  Patient/family consent for telemedicine:  yes  Examination observed and performed by:  Asuncion Blanc, RDLD     Medical Nutrition Therapy Assessment    Name: Holly Fritz  Date of Service: 06/16/2023  MRN: [id number]  Referring Provider: Lemar Pyles APRN,NP-C    [facility]    Reason for Visit:Gastroparesis    Comments: Pt stated she has trouble with eating. She stated she is choking on things. She stated she has slow delay in her esophagus. She has bad spasm in her esophagus. She wakes up sick every morning. She does have hiatal hernia and that is where a lot of her pain is at. She is afraid to eat during the day. She reguritates her food. She stated when she used to eat she would get dizzy and then at one point she even passed out. She was on TPN for awhile. She stated her stomach swells when she eats and gets crampy. She stated choking on liquids and foods. She worked with a Human resources officer back in 2017/2018 and haven't since. Her voice gets tired. Nothing is consistent with how her intake makes her feel. She stated does not seem to be any safe for her. She has organic protein powder and mixed with water and chocolate powder peanut butter. She stated it makes her stomach hurt. She stated having air hunger and feeling like have to yawn when eating. She had chicken tenders from [employer]. She about 10 bites to eat at a roast beef event and has not ate since yesterday morning. [place]hr away and has to find transportation. She stated met with nutritionist and only thing discussed was for her to gain weight.     Food Allergies/Intolerances: No-dairy kind on the edge. A lot of family members are lactose intolerant    Diet Recall: no typical day of eating. Does not eat anything unless she is super hungry. She will make a  shake. She will make a bowl of cereal at night. She would go to sleep and then will wake up sick. She stated she can't do extended release tablets    Beverages-just drinks water and occasionally some sweet tea    Restaurant/Take-out meal frequency : 2x a week    Alcohol Use:no     Describe eating: Slow, Average, Fast; Uncomfortable fullness  Very slow eating pace and chews her food 100x and it does not want to go down. When she was on TPN, they taught her techniques with chewing.     Lifestyle/Physical Activity: not much-she has Ehlers-Danlos Syndrome and is weak       Weight history:Thinks she has lost 10# in past year  Weight gain/loss attempts: no diets; best weight gain she has tried is to get the shakes in her. She stated when this first happened she had tried a lot of diets.     Self-perceived barriers: money right now-going through divorce and was taking care of grandma who recently passed away. Living with sister right now who is helping her out.           Medical History:   Past Medical History:   Diagnosis Date    Autoimmune disorder (CMS HCC)     Awareness under anesthesia     Cervicocranial syndrome     Chiari I malformation (CMS HCC)  Chronic pain     Convergence insufficiency     Dysautonomia (CMS HCC)     Ehlers-Danlos syndrome     Endometriosis     Esophageal reflux     Headache     History of anesthesia complications     patient states make sure you give me enough medication to go to sleep and stay asleep or I will wake up during procedure    POTS (postural orthostatic tachycardia syndrome)     Scoliosis     Shortness of breath     Thyroid  nodule            Surgical History:  Past Surgical History:   Procedure Laterality Date    CHOLECYSTECTOMY LAPAROSCOPIC (SILS) N/A 05/05/2018    Performed by Corina Dibble, MD at [facility] OR MAIN    ESOPHAGEAL DILATION      GASTROSCOPY WITH DILATION N/A 01/17/2023    Performed by Oscar Blazing, MD at [facility] OR ENDO    HX CHOLECYSTECTOMY      HX COLONOSCOPY      HX LAP  CHOLECYSTECTOMY      HX MYOMECTOMY      HX WISDOM TEETH EXTRACTION      MANOMETRY ESOPHAGEAL N/A 05/29/2023    Performed by Glennda Langton, MD at [facility] OR ENDO    POSTERIOR FOSSA DECOMPRESSION      2017    REMOVAL INFUSAPORT Right 08/27/2017    Performed by Corina Dibble, MD at [facility] OR MAIN       Current medications:   Current Outpatient Medications   Medication Sig    albuterol sulfate (PROVENTIL OR VENTOLIN OR PROAIR) 90 mcg/actuation Inhalation oral inhaler Take 2 Puffs by inhalation Every 4 hours as needed    aspirin-acetaminophen-caffeine (EXCEDRIN) 250-250-65 mg Oral Tablet Take 2 Tablets by mouth Every 6 hours as needed for Migraine    capsaicin  (ZOSTRIX) 0.025 % Cream Apply topically Twice daily    cetirizine  (ZYRTEC ) 10 mg Oral Tablet Take 1 Tablet (10 mg total) by mouth Twice daily    clobetasoL-gauze-silicone 0.05 %- 4" X 4" Apply externally Kit Apply topically    clonazePAM (KLONOPIN) 0.5 mg Oral Tablet Take 1 Tablet (0.5 mg total) by mouth Twice daily (Patient taking differently: Take 1 Tablet (0.5 mg total) by mouth Three times a day)    cyclobenzaprine (FLEXERIL) 10 mg Oral Tablet Take 1 Tablet (10 mg total) by mouth Every 8 hours as needed for Muscle spasms    diphenhydrAMINE  (BENADRYL ) 25 mg Oral Capsule Take 1 Capsule (25 mg total) by mouth Once per day as needed for Itching    famotidine  (PEPCID ) 40 mg Oral Tablet Take 1 Tablet (40 mg total) by mouth Twice per day as needed Indications: heartburn    hydrOXYzine  HCL (ATARAX ) 25 mg Oral Tablet Take 2 Tablets (50 mg total) by mouth Three times a day    lamoTRIgine (LAMICTAL) 100 mg Oral Tablet Take 1 Tablet (100 mg total) by mouth Twice daily    lidocaine  (LIDODERM ) 5 % Adhesive Patch, Medicated APPLY 1 PATCH BY TOPICAL ROUTE ONCE DAILY (MAY WEAR UP TO 12HOURS.)    lidocaine  (XYLOCAINE ) 5 % Ointment APPLY TO AFFECTED AREA 1-4 TIMES DAILY AS NEEDED    omeprazole  (PRILOSEC) 40 mg Oral Capsule, Delayed Release(E.C.) Take 1 Capsule (40 mg total) by mouth Daily  30 min before breakfast.  Ok to open capsule and take in applesauce.    ondansetron  (ZOFRAN  ODT) 4 mg Oral Tablet, Rapid Dissolve Take 1  Tab (4 mg total) by mouth Every 8 hours as needed for Nausea/Vomiting    sodium chloride  (SALINE NOSE NASL) by Nasal route    Tacrolimus 0.1 % Ointment Apply topically Twice daily    traZODone  (DESYREL ) 50 mg Oral Tablet Take 1 Tablet (50 mg total) by mouth Every night as needed for Insomnia    TURMERIC ORAL Take by mouth Once a day       Current laboratory values reviewed/discussed:    Lab Results   Component Value Date/Time    SODIUM 137 09/11/2018 02:01 PM    POTASSIUM 3.8 09/11/2018 02:01 PM    CHLORIDE 104 09/11/2018 02:01 PM    CO2 24 09/11/2018 02:01 PM    ANIONGAP 9 (L) 09/11/2018 02:01 PM    BUN 14 09/11/2018 02:01 PM    CREATININE 0.70 09/11/2018 02:01 PM    GLUCOSE Negative 05/08/2018 05:14 PM     Lab Results   Component Value Date/Time    CALCIUM 9.5 09/11/2018 02:01 PM    PHOSPHORUS 3.3 05/08/2018 05:00 PM    TOTALPROTEIN 7.9 09/11/2018 02:01 PM    AST 23 09/11/2018 02:01 PM    ALT 13 09/11/2018 02:01 PM     IRON   Date Value Ref Range Status   12/05/2022 44 (L) 45 - 170 ug/dL Final     No results found for: "B12"  Lab Results   Component Value Date    TRIG 71 12/07/2021    HDLCHOL 47 (L) 12/07/2021    LDLCHOL 121 (H) 12/07/2021    CHOLESTEROL 181 12/07/2021     Lab Results   Component Value Date    HA1C 5.2 09/11/2018           Anthropometric Measurements from 06/10/23:  Height:64" Weight: 110.2#  IBW: 54.5 kg Percent IBW: 91.7 %   BMI: 18.92 (Normal weight- BMI between 18.5 and 24.9)        Estimated Needs:  Calories:  1369 Calories/day  +250/500kcals to promote healthy weight gain:1619-1869kcals  Protein:  50-60  Grams protein/day (1.0-1.2g/kg)  Fluid : or 50oz per day (34ml/kg)      Nutrition Diagnosis: Altered GI function ; related to Current medical condition ; as evidenced by  pt interview and difficulty with nutrition        Nutrition Intervention:  Collected  diet recall, weight history, physical activity regimen, history of weight change attempts, self perceived barriers.     Nutrition Education:RD asked pt if she has worked with a Human resources officer due to her issues with choking on foods and liquids. She has not worked with one since about 2017/2018.RD suggested about seeing on getting a referral for speech therapist which pt was agreeable to. RD discussed with pt on nutrition for gastroparesis.  Discussed how smaller meals throughout the day are better tolerated compared to doing 3 meals a day. RD discussed how wanting to watch foods that are high in fat and fiber are wanting to be limit in the diet.RD discussed how nutritional drinks can also be an easier way to get in nutrition when food intake is hard. RD discussed on looking into nutrition supplement drinks that are higher in calories compared to focus on protein supplement drink that is usually lower in calories.  Discussed on trying fruit and veggie pouches or baby foods that are in an easier form to digest.  Pt to work on journaling her food intake, symptoms and how she is feeling. Pt discussed how her feelings can also  have an effect on her. She had previous worked with neurogastrologist before where she journaled her feelings as well. Pt stated she is trying to do all she can as she prefers not to have a tubefeed. Follow-up appt scheduled in 2 weeks to see how she is doing on 07/03/23. Pt encouraged to reach out should she have any questions.     Nutrition Prescription (recommendation)/Goals:  1) Journal food intake, symptoms, and feelings      Nutrition Monitoring and Evaluation:  1) Continue to monitor weight  2) Evaluate lifestyle modifications  3) Encourage continued modifications to promote healthy weight gain  4) Monitor GI symptoms     52 minutes: 100% face to face counseling time.    Lenice Quill, MS, RD, LD  Outpatient Dietitian

## 2023-06-16 NOTE — Progress Notes (Deleted)
 Medical Nutrition Therapy Assessment    Name: Holly Fritz  Date of Service: 06/16/2023  MRN: [id number]  Referring Provider:    Clinic Visit: [facility]    Reason for Visit:***    Comments: ***    Food Allergies/Intolerances: ***    Diet Recall: *** meals and *** snacks per day   Breakfast-  Lunch-  Dinner-  Snacks-  Beverages-    Restaurant/Take-out meal frequency : ***    Alcohol Use:***    Describe eating: Slow, Average, Fast; Uncomfortable fullness***    Lifestyle/Physical Activity: ***      Weight history: any significant weight change in past year  Weight gain/loss attempts:     Self-perceived barriers:    Additional questions or concerns:       Medical History:   Past Medical History:   Diagnosis Date    Autoimmune disorder (CMS HCC)     Awareness under anesthesia     Cervicocranial syndrome     Chiari I malformation (CMS HCC)     Chronic pain     Convergence insufficiency     Dysautonomia (CMS HCC)     Ehlers-Danlos syndrome     Endometriosis     Esophageal reflux     Headache     History of anesthesia complications     patient states make sure you give me enough medication to go to sleep and stay asleep or I will wake up during procedure    POTS (postural orthostatic tachycardia syndrome)     Scoliosis     Shortness of breath     Thyroid  nodule            Surgical History:  Past Surgical History:   Procedure Laterality Date    CHOLECYSTECTOMY LAPAROSCOPIC (SILS) N/A 05/05/2018    Performed by Corina Dibble, MD at [facility] OR MAIN    ESOPHAGEAL DILATION      GASTROSCOPY WITH DILATION N/A 01/17/2023    Performed by Oscar Blazing, MD at [facility] OR ENDO    HX CHOLECYSTECTOMY      HX COLONOSCOPY      HX LAP CHOLECYSTECTOMY      HX MYOMECTOMY      HX WISDOM TEETH EXTRACTION      MANOMETRY ESOPHAGEAL N/A 05/29/2023    Performed by Glennda Langton, MD at [facility] OR ENDO    POSTERIOR FOSSA DECOMPRESSION      2017    REMOVAL INFUSAPORT Right 08/27/2017    Performed by Corina Dibble, MD at [facility] OR MAIN        Current medications:   Current Outpatient Medications   Medication Sig    albuterol sulfate (PROVENTIL OR VENTOLIN OR PROAIR) 90 mcg/actuation Inhalation oral inhaler Take 2 Puffs by inhalation Every 4 hours as needed    aspirin-acetaminophen-caffeine (EXCEDRIN) 250-250-65 mg Oral Tablet Take 2 Tablets by mouth Every 6 hours as needed for Migraine    capsaicin  (ZOSTRIX) 0.025 % Cream Apply topically Twice daily    cetirizine  (ZYRTEC ) 10 mg Oral Tablet Take 1 Tablet (10 mg total) by mouth Twice daily    clobetasoL-gauze-silicone 0.05 %- 4" X 4" Apply externally Kit Apply topically    clonazePAM (KLONOPIN) 0.5 mg Oral Tablet Take 1 Tablet (0.5 mg total) by mouth Twice daily (Patient taking differently: Take 1 Tablet (0.5 mg total) by mouth Three times a day)    cyclobenzaprine (FLEXERIL) 10 mg Oral Tablet Take 1 Tablet (10 mg total) by mouth Every 8 hours as  needed for Muscle spasms    diphenhydrAMINE  (BENADRYL ) 25 mg Oral Capsule Take 1 Capsule (25 mg total) by mouth Once per day as needed for Itching    famotidine  (PEPCID ) 40 mg Oral Tablet Take 1 Tablet (40 mg total) by mouth Twice per day as needed Indications: heartburn    hydrOXYzine  HCL (ATARAX ) 25 mg Oral Tablet Take 2 Tablets (50 mg total) by mouth Three times a day    lamoTRIgine (LAMICTAL) 100 mg Oral Tablet Take 1 Tablet (100 mg total) by mouth Twice daily    lidocaine  (LIDODERM ) 5 % Adhesive Patch, Medicated APPLY 1 PATCH BY TOPICAL ROUTE ONCE DAILY (MAY WEAR UP TO 12HOURS.)    lidocaine  (XYLOCAINE ) 5 % Ointment APPLY TO AFFECTED AREA 1-4 TIMES DAILY AS NEEDED    omeprazole  (PRILOSEC) 40 mg Oral Capsule, Delayed Release(E.C.) Take 1 Capsule (40 mg total) by mouth Daily 30 min before breakfast.  Ok to open capsule and take in applesauce.    ondansetron  (ZOFRAN  ODT) 4 mg Oral Tablet, Rapid Dissolve Take 1 Tab (4 mg total) by mouth Every 8 hours as needed for Nausea/Vomiting    sodium chloride  (SALINE NOSE NASL) by Nasal route    Tacrolimus 0.1 %  Ointment Apply topically Twice daily    traZODone  (DESYREL ) 50 mg Oral Tablet Take 1 Tablet (50 mg total) by mouth Every night as needed for Insomnia    TURMERIC ORAL Take by mouth Once a day       Current laboratory values reviewed/discussed:    Lab Results   Component Value Date/Time    SODIUM 137 09/11/2018 02:01 PM    POTASSIUM 3.8 09/11/2018 02:01 PM    CHLORIDE 104 09/11/2018 02:01 PM    CO2 24 09/11/2018 02:01 PM    ANIONGAP 9 (L) 09/11/2018 02:01 PM    BUN 14 09/11/2018 02:01 PM    CREATININE 0.70 09/11/2018 02:01 PM    GLUCOSE Negative 05/08/2018 05:14 PM     Lab Results   Component Value Date/Time    CALCIUM 9.5 09/11/2018 02:01 PM    PHOSPHORUS 3.3 05/08/2018 05:00 PM    TOTALPROTEIN 7.9 09/11/2018 02:01 PM    AST 23 09/11/2018 02:01 PM    ALT 13 09/11/2018 02:01 PM     IRON   Date Value Ref Range Status   12/05/2022 44 (L) 45 - 170 ug/dL Final     No results found for: "B12"  Lab Results   Component Value Date    TRIG 71 12/07/2021    HDLCHOL 47 (L) 12/07/2021    LDLCHOL 121 (H) 12/07/2021    CHOLESTEROL 181 12/07/2021     Lab Results   Component Value Date    HA1C 5.2 09/11/2018           Anthropometric Measurements:       IBW: *** kg Percent IBW: *** % @WEIGHTCHANGE @     BMI: There is no height or weight on file to calculate BMI., No height and weight on file for this encounter. (***   Underweight- BMI below 18.5)   Normal weight- BMI between 18.5 and 24.9)   Overweight- BMI between 25 and 29.9)   Obese class I- BMI between 30 and 34.5)   Obese class II- BMI between 35 and 39.9)   Obese class III- BMI over 40)       Estimated Needs:  Calories:  *** Calories/day  Protein:  ***  Grams protein/day  Fluid : ***  Nutrition Diagnosis: {DTDES:50024395} ; related to {ZOXWR:60454098} ; as evidenced by {DTS/S:50024401}    Comments: ***     Nutrition Intervention: Collected anthropometrics, 24 hour diet recall, weight history, physical activity regimen, history of weight change attempts, self perceived  barriers. ***    Nutrition Education:***    Nutrition Prescription (recommendation)/Goals:  1) ***  2) ***  3) ***  4) ***    Nutrition Monitoring and Evaluation:  1) Continue to monitor weight  2) Evaluate lifestyle modifications  3) Encourage continued modifications to promote healthy weight loss  4) ***    *** minutes: 100% face to face counseling time.    Lenice Quill, MS, RD, LD  Outpatient Dietitian

## 2023-06-17 ENCOUNTER — Encounter (INDEPENDENT_AMBULATORY_CARE_PROVIDER_SITE_OTHER): Payer: Self-pay | Admitting: Family

## 2023-06-17 ENCOUNTER — Encounter (HOSPITAL_BASED_OUTPATIENT_CLINIC_OR_DEPARTMENT_OTHER): Payer: Self-pay

## 2023-06-17 ENCOUNTER — Other Ambulatory Visit (INDEPENDENT_AMBULATORY_CARE_PROVIDER_SITE_OTHER): Payer: Self-pay | Admitting: Family

## 2023-06-17 DIAGNOSIS — R634 Abnormal weight loss: Secondary | ICD-10-CM

## 2023-06-17 DIAGNOSIS — R1310 Dysphagia, unspecified: Secondary | ICD-10-CM

## 2023-06-19 ENCOUNTER — Other Ambulatory Visit: Payer: Self-pay

## 2023-06-19 ENCOUNTER — Encounter (RURAL_HEALTH_CENTER): Payer: Self-pay | Admitting: NURSE PRACTITIONER

## 2023-06-19 ENCOUNTER — Ambulatory Visit: Payer: Self-pay | Attending: NURSE PRACTITIONER | Admitting: NURSE PRACTITIONER

## 2023-06-19 VITALS — BP 109/82 | HR 77 | Temp 97.8°F | Resp 16 | Ht 64.0 in | Wt 106.0 lb

## 2023-06-19 DIAGNOSIS — Z79899 Other long term (current) drug therapy: Secondary | ICD-10-CM | POA: Insufficient documentation

## 2023-06-19 DIAGNOSIS — T7840XA Allergy, unspecified, initial encounter: Secondary | ICD-10-CM | POA: Insufficient documentation

## 2023-06-19 DIAGNOSIS — R0602 Shortness of breath: Secondary | ICD-10-CM | POA: Insufficient documentation

## 2023-06-19 DIAGNOSIS — Q796 Ehlers-Danlos syndrome, unspecified: Secondary | ICD-10-CM | POA: Insufficient documentation

## 2023-06-19 DIAGNOSIS — K219 Gastro-esophageal reflux disease without esophagitis: Secondary | ICD-10-CM | POA: Insufficient documentation

## 2023-06-19 NOTE — Progress Notes (Signed)
 PULMONARY, [facility] CLINIC  [address] [facility] [place] [address]                                                                                  Patient Name: Holly Fritz  Date: 06/19/2023  MRN: [id number]  DOB: 1980-07-07    Chief Complaint:   Chief Complaint   Patient presents with    Follow-up After Testing     PFT, labs       History of Present Illness:   Holly Fritz is a 43 y.o. female who presents today to follow up after testing. Today, patient describes occasional shortness of breath and a morning cough with clear/yellow sputum production. Albuterol is used 1-2 times a day. Zyrtec , Pepcid , and Prilosec are used daily routinely. Patient currently smokes half a pack a day and marijuana 1-2 times a day. She is interested in more formal allergy testing, and would like to be referred to [facility]. The patient denies any chest pain, edema, purulence sputum, fevers, shaking chills, or hemoptysis. ACT score is 16 today.    Medication History    Current Outpatient Medications   Medication Sig    albuterol sulfate (PROVENTIL OR VENTOLIN OR PROAIR) 90 mcg/actuation Inhalation oral inhaler Take 2 Puffs by inhalation Every 4 hours as needed    aspirin-acetaminophen-caffeine (EXCEDRIN) 250-250-65 mg Oral Tablet Take 2 Tablets by mouth Every 6 hours as needed for Migraine    capsaicin  (ZOSTRIX) 0.025 % Cream Apply topically Twice daily (Patient not taking: Reported on 06/19/2023)    cetirizine  (ZYRTEC ) 10 mg Oral Tablet Take 1 Tablet (10 mg total) by mouth Twice daily    clobetasoL-gauze-silicone 0.05 %- 4" X 4" Apply externally Kit Apply topically    clonazePAM (KLONOPIN) 0.5 mg Oral Tablet Take 1 Tablet (0.5 mg total) by mouth Twice daily (Patient taking differently: Take 1 Tablet (0.5 mg total) by mouth Three times a day)    cyclobenzaprine (FLEXERIL) 10 mg Oral Tablet Take 1 Tablet (10 mg total) by mouth Every 8 hours as needed for Muscle spasms    diphenhydrAMINE  (BENADRYL ) 25  mg Oral Capsule Take 1 Capsule (25 mg total) by mouth Once per day as needed for Itching    famotidine  (PEPCID ) 40 mg Oral Tablet Take 1 Tablet (40 mg total) by mouth Twice per day as needed Indications: heartburn    hydrOXYzine  HCL (ATARAX ) 25 mg Oral Tablet Take 2 Tablets (50 mg total) by mouth Three times a day    lamoTRIgine (LAMICTAL) 100 mg Oral Tablet Take 1 Tablet (100 mg total) by mouth Twice daily    lidocaine  (LIDODERM ) 5 % Adhesive Patch, Medicated APPLY 1 PATCH BY TOPICAL ROUTE ONCE DAILY (MAY WEAR UP TO 12HOURS.) (Patient not taking: Reported on 06/19/2023)    lidocaine  (XYLOCAINE ) 5 % Ointment APPLY TO AFFECTED AREA 1-4 TIMES DAILY AS NEEDED (Patient not taking: Reported on 06/19/2023)    omeprazole  (PRILOSEC) 40 mg Oral Capsule, Delayed Release(E.C.) Take 1 Capsule (40 mg total) by mouth Daily 30 min before breakfast.  Ok to open capsule and take in applesauce.    ondansetron  (ZOFRAN  ODT) 4 mg Oral Tablet, Rapid Dissolve  Take 1 Tab (4 mg total) by mouth Every 8 hours as needed for Nausea/Vomiting    sodium chloride  (SALINE NOSE NASL) by Nasal route    Tacrolimus 0.1 % Ointment Apply topically Twice daily    traZODone  (DESYREL ) 50 mg Oral Tablet Take 1 Tablet (50 mg total) by mouth Every night as needed for Insomnia    TURMERIC ORAL Take by mouth Once a day       Medical History  Past Medical History:   Diagnosis Date    Autoimmune disorder (CMS HCC)     Awareness under anesthesia     Cervicocranial syndrome     Chiari I malformation (CMS HCC)     Chronic pain     Convergence insufficiency     Dysautonomia (CMS HCC)     Ehlers-Danlos syndrome     Endometriosis     Esophageal reflux     Headache     History of anesthesia complications     patient states make sure you give me enough medication to go to sleep and stay asleep or I will wake up during procedure    POTS (postural orthostatic tachycardia syndrome)     Scoliosis     Shortness of breath     Thyroid  nodule         Past Surgical History  Past  Surgical History:   Procedure Laterality Date    ESOPHAGEAL DILATION      HX CHOLECYSTECTOMY      HX COLONOSCOPY      HX LAP CHOLECYSTECTOMY      HX MYOMECTOMY      HX WISDOM TEETH EXTRACTION      POSTERIOR FOSSA DECOMPRESSION      2017           Family History   Family Medical History:       Problem Relation (Age of Onset)    COPD Paternal Grandmother    Congestive Heart Failure Maternal Grandmother    Hypertension (High Blood Pressure) Mother    No Known Problems Father, Sister, Maternal Grandfather, Paternal Grandfather, Daughter, Son, Maternal Aunt, Maternal Uncle, Paternal Aunt, Paternal Uncle, Other    Non-Hodgkin's lymphoma Maternal Grandmother    Primary Brain tumor Mother              Social History  Social History     Socioeconomic History    Marital status: Legally Separated    Number of children: 0   Occupational History    Occupation: disability    Occupation: Engineer, civil (consulting) farm   Tobacco Use    Smoking status: Every Day     Current packs/day: 0.50     Average packs/day: 0.5 packs/day for 25.3 years (12.7 ttl pk-yrs)     Types: Cigarettes     Start date: 2000     Passive exposure: Current (smoker)    Smokeless tobacco: Never    Tobacco comments:     1.5 ppd   Vaping Use    Vaping status: Never Used   Substance and Sexual Activity    Alcohol use: Not Currently    Drug use: Yes     Types: Marijuana     Comment: daily   Other Topics Concern    Ability to Walk 1 Flight of Steps without SOB/CP Yes    Ability to Walk 2 Flight of Steps without SOB/CP Yes   Social History Narrative    Respiratory history: None    Smoking history: Patient is a half a  pack a day smoker since 2000. She also smokes marijuana 2-3 times a day.    Significant secondhand smoke exposure: Mother smoked    Smoking in the home: None    Occupational history: Disabled.    Occupational exposures: No exposures to chemicals, dust, pesticides, fumes or asbestos    The patient denies exposure to farms or raw sewage. Not raising wild birds. No hunting and  skinning wild animals.    The patient has not been out country recently or [employer]:     [employer]: Chief Financial Officer: Forensic psychologist: Yes    Humidifier: No    Water: [place]    Pets in the home: Not currently     Social Determinants of Health     Financial Resource Strain: Low Risk  (05/01/2023)    Financial Resource Strain     SDOH Financial: No   Transportation Needs: Low Risk  (05/01/2023)    Transportation Needs     SDOH Transportation: No   Social Connections: Low Risk  (05/01/2023)    Social Connections     SDOH Social Isolation: 5 or more times a week   Intimate Partner Violence: Low Risk  (05/01/2023)    Intimate Partner Violence     SDOH Domestic Violence: No   Housing Stability: Low Risk  (05/01/2023)    Housing Stability     SDOH Housing Situation: I have housing.     SDOH Housing Worry: No        Alcohol use   Social History     Substance and Sexual Activity   Alcohol Use Not Currently       Tobacco use    Social History     Tobacco Use   Smoking Status Every Day    Current packs/day: 0.50    Average packs/day: 0.5 packs/day for 25.3 years (12.7 ttl pk-yrs)    Types: Cigarettes    Start date: 2000    Passive exposure: Current (smoker)   Smokeless Tobacco Never   Tobacco Comments    1.5 ppd         ROS   Nursing Notes:   Idamae Maize, RTR  06/19/23 1412  Signed     06/19/23 1400   OTHER   All systems reviewed and some are positive- see below   Constitutional   Constitutional Positive for Malaise/Fatigue   HENT   HENT Positive for Headaches   Cardiovascular   Cardiovascular Positive for Palpitations   Respiratory   Respiratory Positive for Cough;Shortness of Breath;Sputum Production  (yellow sputum)   Gastrointestinal   Gastrointestinal Positive for Heartburn;Nausea;Vomiting         Idamae Maize, RTR  06/19/23 1414  Signed     06/19/23 1400   IN THE PAST 4 WEEKS, HOW OFTEN   Did your asthma keep you from getting as much done at work, school or home? 4   Have you had shortness  of breath? 1   Did your asthma symptoms wake up at night or earlier than usual in the morning? 5   Have you used your rescue inhaler or nebulizer medication? 2   How would you rate your asthma control ? 4   Total 16   Interpretation of Total Not Well Controlled          Allergies reviewed:   Allergies   Allergen Reactions    Adhesive Rash    Doxycycline Rash  Agitation      Propranolol Hives/ Urticaria, Itching and Rash    Ciprofloxacin     Gabapentin  Other Adverse Reaction (Add comment)     Patient reports is "sensitive" to medication.    Levaquin [Levofloxacin]     Oxycodone      Patient reports "sensitivity" to medication.    Penicillins      unknown reaction when an infant - HAS TOLERATED KEFLEX    Tapentadol      Patient reports "sensitivity" to medication.          Review of systems reviewed  PFT reviewed  Labs reviewed      Imaging:     CT CHEST WO IV CONTRAST performed on 10/16/2022 2:12 PM.     REASON FOR EXAM:  J98.4: Other disorders of lung  CLINICAL INDICATION: J98.4: Other disorders of lung  RADIATION CT DOSE: 160.01   TECHNIQUE: Helical images were obtained through the chest. ALARA technique using automated exposure control, adjustment of mA and/or kV according to patient size and/or iterative reconstruction.     COMPARISON:  08/27/2017     FINDINGS:  There is no axillary, hilar, or mediastinal lymphadenopathy. No pleural or pericardial fluid. No coronary artery atherosclerotic calcification.     Mild scarring of both lung apices is noted. No suspicious nodules, masses, or consolidation are seen.     IMPRESSION:  1. Mild scarring of both lung apices  2. No suspicious pulmonary nodules     FLUORO ESOPHAGRAM SINGLE-CONTRAST (NO AIR) performed on 12/19/2022 10:37 AM.     REASON FOR EXAM:  R13.10: Dysphagia  trouble with foods sticking in upper esophagus, hx of dilation  CONTRAST: Barium  FLUORO TIME: 1 min and 16 secs     RADIATION DOSE:     FT:  1 min and 16 secs       FINDINGS:  The oropharynx is  normal, with no mass or diverticulum. The esophagus shows no mass or stricture. No gastroesophageal reflux is seen. 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     IMPRESSION:  Mild esophageal dysmotility    PFT:  Recent Results (from the past [id number] hours)   PULMONARY FUNCTION TESTING-ADULT    Collection Time: 06/02/23  8:57 AM    Narrative                                      Medicine          Site: [facility], [address], [place], [place] [address]  ID: [id number]  Name: BRIONNAH, BERSTLER  Visit Date: 06/02/2023  Second ID: [id number]  Referring Doctor: Rushie Courier  Reviewing Doctor: Leodis Rainwater  Technician: Mechele Spiegel  Age: 79  DOB: October 09, 1980  Sex: Female  Race: Caucasian  Height: 64.00  Inches  Weight: 108.00  Lbs  BSA: 1.51  Order IDs: [id number]  Requested Test(s): Pulmonary Function Testing - Adult  Diagnosis:   Shortness of breath  Dyspnea: Walking less than 100 yards  Cough: Productive  Wheeze: Rare  Tbco Prod: Cigarette  Yrs Smk: 25.0  Pks/Day: 0.5  Medications:   Albuterol mdi  Pre Test Comments:    Oximetry on room air ambulation 96%  Post Test Comments:   Good patient effort and cooperation. The results of this test meet the ATS for   acceptability and repeatability. No bronchodilator given.  Lung volumes done   with  plethysmography.  Review Status:   Completed+Posted+Locked                                                          Pre-Bronch    Post-Bronch                                   Pred   LLN    ULN   Actual %Pred  Actual %Chng  SPIROMETRY  FVC (L)                          3.65   2.96   4.35   3.49      95                FEV1 (L)                         2.95   2.37   3.54   2.59      87                FEV1/FVC (%)                       82     72     91     74      90                FEF 25% (L/sec)                  5.38   3.24   7.53   3.54      65                FEF 50% (L/sec)                  4.12   2.30   5.93   2.67      64                FEF 75% (L/sec)                   1.60   0.64   2.55   0.89      56                FEF 25-75% (L/sec)               3.02   1.78   4.26   2.14      70                FEF Max (L/sec)                  6.92   5.20   8.63   3.94      56                FIVC (L)                                              3.38  FIF 50% (L/sec)                  3.53   2.10   4.97   2.24      63                FIF Max (L/sec)                                       2.33                        Expiratory Time (sec)                                 7.17                        Back Extrap Vol (L)                                   0.08                        Time To FEFmax (sec)                                 0.126                        LUNG VOLUMES  SVC (L)                          3.65   2.96   4.35   3.12      85                IC (L)                           2.25                 1.76      78                ERV (L)                          1.40                 1.36      97                TGV (L)                          2.80   1.94   3.67   3.04     108                RV (Pleth) (L)                   1.65   1.02   2.28   2.07     125                TLC (  Pleth) (L)                  5.05   4.17   5.94   5.03      99                RV/TLC (Pleth) (%)                 32     23     41     41     127                DIFFUSION  DLCOunc (ml/min/mmHg)           25.07                17.67      70                DL/VA (ml/min/mmHg/L)            5.29                 3.42      64                VA (L)                           5.05   4.17   5.94   5.16     102                BHT (sec)                                            10.00                        IVC (L)                                               3.20                        TLC (SB) (L)                                          5.31                        AIRWAYS RESISTANCE    Interpretation:   Impression: Spirometry is without evidence of airway obstruction.  Measurement   of lung volumes does not  demonstrate the presence of a restrictive lung   pattern.  Air trapping is not noted.  The diffusion capacity is at the lowest   limits of normal.  Flow volume loop does not demontrate evidence of a large   airway obstruction.  This interpretation has been electronically signed:  Joie Narrow   06/08/2023  12:10:29 PM       Labs:    No visits with results within 1 Day(s) from this visit.   Latest known visit with results is:   Appointment on 06/02/2023   Component Date  Value    HOUSE DUST MITE (D1) (DE* 06/02/2023 Low Level     HOUSE DUST MITE (D1) (DE* 06/02/2023 0.49 (H)     IgE 06/02/2023 13.20     ALTERNARIA TENUIS (M6) I* 06/02/2023 Absent / Undetectable     ALTERNARIA TENUIS (M6) 06/02/2023 <0.10     ASPERGILLUS FUMIGATUS (M* 06/02/2023 Absent / Undetectable     ASPERGILLUS FUMIGATUS (M* 06/02/2023 <0.10     [place] GRASS (G2) INTER* 06/02/2023 Absent / Undetectable     [place] GRASS (G2) 06/02/2023 <0.10     COCKROACH, GERMAN (I6) I* 06/02/2023 Absent / Undetectable     COCKROACH, GERMAN (I6) 06/02/2023 <0.10     COMMON RAGWEED (W1) INTE* 06/02/2023 Absent / Undetectable     COMMON RAGWEED (W1) 06/02/2023 <0.10     CAT DANDER (E1) INTERPRE* 06/02/2023 Absent / Undetectable     CAT DANDER (E1) 06/02/2023 <0.10     HOUSE DUST MITE (D2) (DE* 06/02/2023 Very Low Level     HOUSE DUST MITE (D2) (DE* 06/02/2023 0.34 (H)     DOG DANDER (E5) INTERPRE* 06/02/2023 Absent / Undetectable     DOG DANDER (E5) 06/02/2023 <0.10     ELM (T8) INTERPRETATION 06/02/2023 Absent / Undetectable     ELM (T8) 06/02/2023 <0.10     [place] GRASS (G10) INTE* 06/02/2023 Absent / Undetectable     [place] GRASS (G10) 06/02/2023 <0.10     MEADOW GRASS ([place]  B* 06/02/2023 Absent / Undetectable     MEADOW GRASS ([place]  B* 06/02/2023 <0.10     [place] (T7) INTERPRETATION 06/02/2023 Absent / Undetectable     [place] (T7) 06/02/2023 <0.10     PECAN, HICKORY (T22) INT* 06/02/2023 Absent / Undetectable     PECAN, HICKORY (T22) 06/02/2023  <0.10     [place] (T10) INTERP* 06/02/2023 Absent / Undetectable     [place] (T10) 06/02/2023 <0.10     WBC 06/02/2023 5.4     RBC 06/02/2023 4.57     HGB 06/02/2023 13.7     HCT 06/02/2023 41.8     MCV 06/02/2023 91.5     MCH 06/02/2023 30.0     MCHC 06/02/2023 32.8     RDW-CV 06/02/2023 13.8     PLATELETS 06/02/2023 358     MPV 06/02/2023 8.4 (L)     NEUTROPHIL % 06/02/2023 55.9     LYMPHOCYTE % 06/02/2023 33.2     MONOCYTE % 06/02/2023 8.2     EOSINOPHIL % 06/02/2023 1.9     BASOPHIL % 06/02/2023 0.6     NEUTROPHIL # 06/02/2023 3.02     LYMPHOCYTE # 06/02/2023 1.79     MONOCYTE # 06/02/2023 0.44     EOSINOPHIL # 06/02/2023 0.10     BASOPHIL # 06/02/2023 <0.04     IMMATURE GRANULOCYTE % 06/02/2023 0.2     IMMATURE GRANULOCYTE # 06/02/2023 <0.04      IgE:   Latest Reference Range & Units 06/02/23 09:54   IgE kU/L 13.20     Allergens:   Latest Reference Range & Units 06/02/23 09:54   HOUSE DUST MITE (D1) (DERMATOPHAGOIDES PTERONYSSINUS) INTERPRETATION Absent / Undetectable  Low Level   HOUSE DUST MITE (D1) (DERMATOPHAGOIDES PTERONYSSINUS) <0.10 kU/L 0.49 (H)   HOUSE DUST MITE (D2) (DERMATOPHAGOIDES FARINAE) INTERPRETATION Absent / Undetectable  Very Low Level   HOUSE DUST MITE (D2) (DERMATOPHAGOIDES FARINAE) <0.10 kU/L 0.34 (H)   (H): Data is abnormally high      There is no immunization  history on file for this patient.         Vitals:   Vitals:    06/19/23 1406   BP: 109/82   Pulse: 77   Resp: 16   Temp: 36.6 C (97.8 F)   TempSrc: Tympanic   SpO2: 98%   Weight: 48.1 kg (106 lb)   Height: 1.626 m (5\' 4" )   BMI: 18.19       PHYSICAL EXAMINATION:      Constitutional    General appearance of the patient: well groomed, no distress noted  Eyes:  Pupils are equal and reactive to light , Pink and clear  Conjunctiva  Ears, Nose, Mouth, and Throat: nose appears normal  Neck: non tender, no masses, trachea midline  Lungs: Breath sounds clear throughout. Normal respiratory effort  CV:  Regular rate and rhythm, No  murmur gallops or rubs, Normal PMI, no peripheral edema  Musc: Normal gait and station, no digital cyanosis or clubbing  Skin: Warm and Dry, No rash, lesion or ulcers. Normal turgor and temperature.  Psych:  Alert and oriented x3 with appropriate affect      ICD-10-CM    1. Shortness of breath  R06.02       2. GERD (gastroesophageal reflux disease)  K21.9       3. Allergies  T78.40XA Refer to [facility] Allergy/Immunology,Cheat [facility]      4. Ehlers-Danlos syndrome  Q79.60          Assessment & Plan    Holly Fritz is a 43 y.o., White female with a Dx of     (R06.02) Shortness of breath  (primary encounter diagnosis)  Plan: Patient has an underlying history of shortness of breath that she feels is related to exposure to mold.  She does have an underlying history of asthma that was diagnosed in the past.  Her most recent PFT shows no obstructive component with an FEV1 of 87% of predicted or 2.59 L.  No air trapping or hyperinflation was noted.  Diffusion capacity was in the low limits of normal.  Patient was unable to obtain a methacholine challenge due to having [place]  Medicaid.  This is not a covered test through [place]  Medicaid.  At this time she feels that her asthma is well controlled and is using her albuterol 1 to 2 times a day and feels benefit.  She feels that her symptoms have been increased due to have an exposure to mold.  Respiratory allergen panel was obtained and shows an equivocal IgE level at 13.  She did have a low-level allergy to dust mites.  The patient is requesting further allergy testing.  An order will be sent to Allergy.  She was encouraged notify the office if she has any increase in her symptoms prior to her next office visit  Continue albuterol 2 puffs every 4 hours as needed    (K21.9) GERD (gastroesophageal reflux disease)  Plan: Patient has an underlying history of gastroesophageal reflux symptoms that are well controlled at this time.  She does have GI  difficulties such as recurrent vomiting, reflux and difficulty swallowing.  Patient does have some scars in the apices.  She does follow with GI at [facility] in [place]  Continue omeprazole   Continue Pepcid     (T78.40XA) Allergies  Plan: Refer to [facility] Allergy/Immunology,Cheat [facility]        Patient has an underlying history of allergies.  She  would like more formal allergy testing.  Referral will be placed to allergy  Continue Zyrtec     (Q79.60) Ehlers-Danlos syndrome  Plan: Patient has an underlying history of Ehlers-Danlos that was diagnosed in [place]         Return in about 6 months (around 12/20/2023) for Asthma.        Orders Placed This Encounter    Refer to [facility] Allergy/Immunology,[facility]at [facility]             The patient was given the opportunity to ask questions and those questions were answered to the patient's satisfaction. The patient was encouraged to call with any additional questions or concerns.     Discussed with patient effects and side effects of medications. Medication safety was discussed,    The patient was informed to contact the office within 7 business days if a message/lab results/referral/imaging results have not been conveyed to the patient.    I am scribing for, and in the presence of, Rushie Courier, CRNP, for services provided on 06/19/2023.  [address], [place]CR[place]E   [place], [place]      I personally performed the services described in this documentation, as scribed  in my presence, and it is both accurate and complete.    Rushie Courier, CRNP  This note may have been partially generated using MModal Fluency Direct system, and there may be some incorrect words, spellings, and punctuation that were not noted in checking the note before saving.

## 2023-06-19 NOTE — Nursing Note (Signed)
 06/19/23 1400   OTHER   All systems reviewed and some are positive- see below   Constitutional   Constitutional Positive for Malaise/Fatigue   HENT   HENT Positive for Headaches   Cardiovascular   Cardiovascular Positive for Palpitations   Respiratory   Respiratory Positive for Cough;Shortness of Breath;Sputum Production  (yellow sputum)   Gastrointestinal   Gastrointestinal Positive for Heartburn;Nausea;Vomiting

## 2023-06-19 NOTE — Progress Notes (Deleted)
 Patient has an underlying history of shortness of breath that she feels is related to exposure to mold.  She does have an underlying history of asthma that was diagnosed in the past.  Her most recent PFT shows no obstructive component with an FEV1 of 87% of predicted or 2.59 L.  No air trapping or hyperinflation was noted.  Diffusion capacity was in the low limits of normal.  Patient was unable to obtain a methacholine challenge due to having [place]  Medicaid.  This is not a covered test through [place]  Medicaid.  At this time she feels that her asthma is well controlled and is using her albuterol 1 to 2 times a day and feels benefit.  She feels that her symptoms have been increased due to have an exposure to mold.  Respiratory allergen panel was obtained and shows an equivocal IgE level at 13.  She did have a low-level allergy to dust mites.  The patient is requesting further allergy testing.  An order will be sent to Allergy.  She was encouraged notify the office if she has any increase in her symptoms prior to her next office visit    Continue albuterol 2 puffs every 4 hours as needed    Patient has an underlying history of gastroesophageal reflux symptoms that are well controlled at this time.  She does have GI difficulties such as recurrent vomiting, reflux and difficulty swallowing.  Patient does have some scars in the apices.  She does follow with GI at [facility] in [place]    Continue omeprazole   Continue Pepcid     Patient has an underlying history of allergies.  She would like more formal allergy testing.  Referral will be placed to allergy    Continue Zyrtec     Patient has an underlying history of Ehlers-Danlos that was diagnosed in [place] .

## 2023-06-19 NOTE — Nursing Note (Signed)
 06/19/23 1400   IN THE PAST 4 WEEKS, HOW OFTEN   Did your asthma keep you from getting as much done at work, school or home? 4   Have you had shortness of breath? 1   Did your asthma symptoms wake up at night or earlier than usual in the morning? 5   Have you used your rescue inhaler or nebulizer medication? 2   How would you rate your asthma control ? 4   Total 16   Interpretation of Total Not Well Controlled

## 2023-06-20 ENCOUNTER — Encounter (INDEPENDENT_AMBULATORY_CARE_PROVIDER_SITE_OTHER): Payer: Self-pay

## 2023-06-20 ENCOUNTER — Encounter (HOSPITAL_BASED_OUTPATIENT_CLINIC_OR_DEPARTMENT_OTHER): Payer: Self-pay

## 2023-06-20 ENCOUNTER — Ambulatory Visit

## 2023-06-20 ENCOUNTER — Other Ambulatory Visit (HOSPITAL_COMMUNITY)

## 2023-06-20 ENCOUNTER — Other Ambulatory Visit (INDEPENDENT_AMBULATORY_CARE_PROVIDER_SITE_OTHER): Payer: Self-pay

## 2023-06-20 VITALS — BP 110/74 | HR 92 | Temp 97.9°F | Ht 64.0 in | Wt 106.0 lb

## 2023-06-20 DIAGNOSIS — Z20828 Contact with and (suspected) exposure to other viral communicable diseases: Secondary | ICD-10-CM | POA: Insufficient documentation

## 2023-06-20 LAB — COVID-19, FLU A/B, RSV RAPID BY PCR
INFLUENZA VIRUS TYPE A: NOT DETECTED
INFLUENZA VIRUS TYPE B: NOT DETECTED
RESPIRATORY SYNCTIAL VIRUS (RSV): NOT DETECTED
SARS-CoV-2: NOT DETECTED

## 2023-06-20 MED ORDER — FLUTICASONE PROPIONATE 50 MCG/ACTUATION NASAL SPRAY,SUSPENSION
1.0000 | Freq: Every day | NASAL | 0 refills | Status: AC
Start: 2023-06-20 — End: 2023-07-04

## 2023-06-20 NOTE — Progress Notes (Signed)
 [facility]  [address]  [place] [id number]  Operated by [facility]     Name: Holly Fritz MRN:  [id number]   Date of Birth: April 20, 1980 Age: 43 y.o.   Date: 06/20/2023  Time: 13:14     Provider: Kary Pages, PA-C  PCP: [place] Taylor Hott, NP    Reason for visit:   Chief Complaint              RSV Patient states she was exposed to rsv on Sunday.             Vitals:   Vitals:    06/20/23 1303   BP: 110/74   Pulse: 92   Temp: 36.6 C (97.9 F)   TempSrc: Tympanic   SpO2: 99%   Weight: 48.1 kg (106 lb)   Height: 1.626 m (5\' 4")   BMI: 18.19        History of Present Illness:  Holly Fritz is a 43 y.o. female presenting with an exposure to RSV approximately 5 days.  Patient denies nausea, vomiting, fever, diarrhea, chest pain, and shortness of breath.  Patient denies any chronic medical conditions such as COPD and asthma.  Patient denies any symptoms right now other than nasal congestion.  Patient does report that she does have seasonal allergies and currently takes Zyrtec or Claritin with nasal saline to mitigate symptom relief.    Past Medical History:  Past Medical History:   Diagnosis Date    Autoimmune disorder (CMS HCC)     Awareness under anesthesia     Cervicocranial syndrome     Chiari I malformation (CMS HCC)     Chronic pain     Convergence insufficiency     Dysautonomia (CMS HCC)     Ehlers-Danlos syndrome     Endometriosis     Esophageal reflux     Headache     History of anesthesia complications     patient states make sure you give me enough medication to go to sleep and stay asleep or I will wake up during procedure    POTS (postural orthostatic tachycardia syndrome)     Scoliosis     Shortness of breath     Thyroid nodule          Past Surgical History:  Past Surgical History:   Procedure Laterality Date    ESOPHAGEAL DILATION      HX CHOLECYSTECTOMY      HX COLONOSCOPY      HX LAP CHOLECYSTECTOMY      HX MYOMECTOMY      HX WISDOM TEETH EXTRACTION      POSTERIOR  FOSSA DECOMPRESSION      20 17         Allergies:  Allergies   Allergen Reactions    Adhesive Rash    Doxycycline Rash     Agitation      Propranolol Hives/ Urticaria, Itching and Rash    Ciprofloxacin     Gabapentin  Other Adverse Reaction (Add comment)     Patient reports is "sensitive" to medication.    Levaquin [Levofloxacin]     Oxycodone      Patient reports "sensitivity" to medication.    Penicillins      unknown reaction when an infant - HAS TOLERATED KEFLEX    Tapentadol      Patient reports "sensitivity" to medication.     Medications:  Current Outpatient Medications   Medication Sig    albuterol sulfate (  PROVENTIL OR VENTOLIN OR PROAIR) 90 mcg/actuation Inhalation oral inhaler Take 2 Puffs by inhalation Every 4 hours as needed    aspirin-acetaminophen-caffeine (EXCEDRIN) 250-250-65 mg Oral Tablet Take 2 Tablets by mouth Every 6 hours as needed for Migraine    capsaicin  (ZOSTRIX) 0.025 % Cream Apply topically Twice daily    cetirizine  (ZYRTEC ) 10 mg Oral Tablet Take 1 Tablet (10 mg total) by mouth Twice daily    clobetasoL-gauze-silicone 0.05 %- 4" X 4" Apply externally Kit Apply topically    clonazePAM (KLONOPIN) 0.5 mg Oral Tablet Take 1 Tablet (0.5 mg total) by mouth Twice daily (Patient taking differently: Take 1 Tablet (0.5 mg total) by mouth Three times a day)    cyclobenzaprine (FLEXERIL) 10 mg Oral Tablet Take 1 Tablet (10 mg total) by mouth Every 8 hours as needed for Muscle spasms    diphenhydrAMINE  (BENADRYL ) 25 mg Oral Capsule Take 1 Capsule (25 mg total) by mouth Once per day as needed for Itching    famotidine  (PEPCID ) 40 mg Oral Tablet Take 1 Tablet (40 mg total) by mouth Twice per day as needed Indications: heartburn    hydrOXYzine  HCL (ATARAX ) 25 mg Oral Tablet Take 2 Tablets (50 mg total) by mouth Three times a day    lamoTRIgine (LAMICTAL) 100 mg Oral Tablet Take 1 Tablet (100 mg total) by mouth Twice daily    lidocaine  (LIDODERM ) 5 % Adhesive Patch, Medicated APPLY 1 PATCH BY TOPICAL  ROUTE ONCE DAILY (MAY WEAR UP TO 12HOURS.) (Patient not taking: Reported on 06/19/2023)    lidocaine  (XYLOCAINE ) 5 % Ointment APPLY TO AFFECTED AREA 1-4 TIMES DAILY AS NEEDED (Patient not taking: Reported on 06/19/2023)    omeprazole  (PRILOSEC) 40 mg Oral Capsule, Delayed Release(E.C.) Take 1 Capsule (40 mg total) by mouth Daily 30 min before breakfast.  Ok to open capsule and take in applesauce.    ondansetron  (ZOFRAN  ODT) 4 mg Oral Tablet, Rapid Dissolve Take 1 Tab (4 mg total) by mouth Every 8 hours as needed for Nausea/Vomiting    sodium chloride  (SALINE NOSE NASL) by Nasal route    Tacrolimus 0.1 % Ointment Apply topically Twice daily    traZODone  (DESYREL ) 50 mg Oral Tablet Take 1 Tablet (50 mg total) by mouth Every night as needed for Insomnia    TURMERIC ORAL Take by mouth Once a day     Family History:  Family Medical History:       Problem Relation (Age of Onset)    COPD Paternal Grandmother    Congestive Heart Failure Maternal Grandmother    Hypertension (High Blood Pressure) Mother    No Known Problems Father, Sister, Maternal Grandfather, Paternal Grandfather, Daughter, Son, Maternal Aunt, Maternal Uncle, Paternal Aunt, Paternal Uncle, Other    Non-Hodgkin's lymphoma Maternal Grandmother    Primary Brain tumor Mother            Social History:  Social History     Socioeconomic History    Marital status: Legally Separated    Number of children: 0   Occupational History    Occupation: disability    Occupation: Engineer, civil (consulting) farm   Tobacco Use    Smoking status: Every Day     Current packs/day: 0.50     Average packs/day: 0.5 packs/day for 25.4 years (12.7 ttl pk-yrs)     Types: Cigarettes     Start date: 2000     Passive exposure: Current (smoker)    Smokeless tobacco: Never    Tobacco comments:  1.5 ppd   Vaping Use    Vaping status: Never Used   Substance and Sexual Activity    Alcohol use: Not Currently    Drug use: Yes     Types: Marijuana     Comment: daily   Other Topics Concern    Ability to Walk 1 Flight  of Steps without SOB/CP Yes    Ability to Walk 2 Flight of Steps without SOB/CP Yes   Social History Narrative    Respiratory history: None    Smoking history: Patient is a half a pack a day smoker since 2000. She also smokes marijuana 2-3 times a day.    Significant secondhand smoke exposure: Mother smoked    Smoking in the home: None    Occupational history: Disabled.    Occupational exposures: No exposures to chemicals, dust, pesticides, fumes or asbestos    The patient denies exposure to farms or raw sewage. Not raising wild birds. No hunting and skinning wild animals.    The patient has not been out country recently or [employer]:     [employer]: Chief Financial Officer: Forensic psychologist: Yes    Humidifier: No    Water: [place]    Pets in the home: Not currently     Social Determinants of Health     Financial Resource Strain: Low Risk  (05/01/2023)    Financial Resource Strain     SDOH Financial: No   Transportation Needs: Low Risk  (05/01/2023)    Transportation Needs     SDOH Transportation: No   Social Connections: Low Risk  (05/01/2023)    Social Connections     SDOH Social Isolation: 5 or more times a week   Intimate Partner Violence: Low Risk  (05/01/2023)    Intimate Partner Violence     SDOH Domestic Violence: No   Housing Stability: Low Risk  (05/01/2023)    Housing Stability     SDOH Housing Situation: I have housing.     SDOH Housing Worry: No       Review of Systems:  All pertinent Review of System as address in the HPI.    Physical Exam:  General Appearance: Alert and oriented; No acute distress; Cooperative; Appears stated age; nontoxic-appearing  Head: Normocephalic; Atraumatic; minimal tenderness to palpation over bilateral maxillary and frontal sinus regions  Eyes: PERRL; EOMI; Conjunctivae clear  Ears: External auditory canals clear; Tympanic membranes intact with normal light reflex  Nose: Nares patent; No drainage appreciated  Throat:  Minimal pharyngeal erythema without tonsillar  exudate; uvula midline; Teeth and gingivae with no abnormalities appreciated  Neck: Supple; Full ROM  Lungs: Clear to auscultation in all lung fields; No rhonchi; No wheezes    Heart: Regular rate and rhythm; No murmurs appreciated  Neurological: Alert and oriented     There are no exam notes on file for this visit.     ABDOMINAL DUPLEX - CELIAC AND SMA  Procedure: A duplex examination of the visceral arteries was performed using 2D with color and spectral Doppler for evaluation of atherosclerotic disease.  Visceral/Mesenteric: Patient was NPO for this study.Normal flow velocities noted in the abdominal aorta, superior mesenteric artery (SMA), and celiac artery.Celiac artery evaluated on inhalation and exhalation with no determinate difference.  Conclusions: Normal flow velocities noted in the abdominal aorta, superior mesenteric artery (SMA), and celiac artery.Celiac artery evaluated on inhalation and exhalation with no determinate difference.  Assessment:    ICD-10-CM    1. RSV exposure  Z20.828 COVID-19, FLU A/B, RSV RAPID BY PCR           Plan:  Orders Placed This Encounter    COVID-19, FLU A/B, RSV RAPID BY PCR     RSV, COVID, flu swab sent.  Patient will be notified of positive results.  Patient educated the symptoms are viral in etiology and to continue with supportive measures at this time such as Tylenol, ibuprofen , humidifier use, increased fluids, and other supportive measures.  Patient prescribed fluticasone nasal spray to mitigate symptom relief.  Patient encouraged to seek re-evaluation with PCP in approximately 1 week.  Patient advised to seek urgent medical attention if any new or life-threatening symptoms develop.  Patient is agreeable to today's plan and all concerns addressed.    Merryn Thaker, PA-C     Portions of this note may be dictated using voice recognition software or a dictation service. Variances in spelling and vocabulary are possible and unintentional. Not all errors are  caught/corrected. Please notify the [facility] if any discrepancies are noted or if the meaning of any statement is not clear.

## 2023-06-23 ENCOUNTER — Encounter (RURAL_HEALTH_CENTER): Payer: Self-pay | Admitting: NURSE PRACTITIONER

## 2023-06-24 DIAGNOSIS — R1310 Dysphagia, unspecified: Secondary | ICD-10-CM

## 2023-06-24 DIAGNOSIS — R1110 Vomiting, unspecified: Secondary | ICD-10-CM

## 2023-06-29 ENCOUNTER — Other Ambulatory Visit: Payer: Self-pay

## 2023-06-29 ENCOUNTER — Ambulatory Visit: Attending: Family

## 2023-06-29 DIAGNOSIS — E611 Iron deficiency: Secondary | ICD-10-CM | POA: Insufficient documentation

## 2023-06-29 LAB — IRON TRANSFERRIN AND TIBC
IRON (TRANSFERRIN) SATURATION: 8 % — ABNORMAL LOW (ref 15–50)
IRON: 28 ug/dL — ABNORMAL LOW (ref 45–170)
TOTAL IRON BINDING CAPACITY: 337 ug/dL (ref 252–504)
TRANSFERRIN: 241 mg/dL (ref 180–360)

## 2023-06-30 ENCOUNTER — Other Ambulatory Visit (INDEPENDENT_AMBULATORY_CARE_PROVIDER_SITE_OTHER): Payer: Self-pay | Admitting: Family

## 2023-06-30 ENCOUNTER — Encounter (INDEPENDENT_AMBULATORY_CARE_PROVIDER_SITE_OTHER): Payer: Self-pay | Admitting: Family

## 2023-06-30 DIAGNOSIS — E611 Iron deficiency: Secondary | ICD-10-CM | POA: Insufficient documentation

## 2023-06-30 NOTE — Telephone Encounter (Signed)
 Regarding: Clinical Question  ----- Message from Matthias Sor sent at 06/30/2023 12:53 PM EDT -----  Copied From CRM #[id number].  [place], Sarah, APRN,NP-C    Hammen, Adler called with a clinical question. Pt asking if provider has had a chance to review her labs, pt states that her iron is very low. Pt asking if she can get an infusion. Pt also states that she would like to schedule her SIBO test. Please call pt at [phone]. Please advise. Thank you!

## 2023-07-01 ENCOUNTER — Encounter (INDEPENDENT_AMBULATORY_CARE_PROVIDER_SITE_OTHER): Payer: Self-pay | Admitting: Family

## 2023-07-02 ENCOUNTER — Other Ambulatory Visit (INDEPENDENT_AMBULATORY_CARE_PROVIDER_SITE_OTHER): Payer: Self-pay | Admitting: Family

## 2023-07-02 ENCOUNTER — Ambulatory Visit (HOSPITAL_BASED_OUTPATIENT_CLINIC_OR_DEPARTMENT_OTHER): Payer: Self-pay | Admitting: Family Medicine

## 2023-07-02 DIAGNOSIS — E611 Iron deficiency: Secondary | ICD-10-CM

## 2023-07-03 ENCOUNTER — Ambulatory Visit: Admitting: DIETICIAN-REGISTERED

## 2023-07-09 ENCOUNTER — Ambulatory Visit
Admission: RE | Admit: 2023-07-09 | Discharge: 2023-07-09 | Disposition: A | Discharge status: Home / Self Care | Payer: Self-pay | Source: Ambulatory Visit

## 2023-07-09 ENCOUNTER — Encounter (INDEPENDENT_AMBULATORY_CARE_PROVIDER_SITE_OTHER): Payer: Self-pay | Admitting: Family

## 2023-07-09 ENCOUNTER — Other Ambulatory Visit: Payer: Self-pay

## 2023-07-09 VITALS — BP 108/76 | HR 81 | Temp 98.3°F | Resp 17 | Ht 64.0 in | Wt 105.9 lb

## 2023-07-09 DIAGNOSIS — E611 Iron deficiency: Secondary | ICD-10-CM | POA: Insufficient documentation

## 2023-07-09 MED ORDER — HYDROCORTISONE SOD SUCCINATE 100 MG/2 ML VIAL WRAPPER
100.0000 mg | Freq: Once | INTRAMUSCULAR | Status: DC | PRN
Start: 2023-07-09 — End: 2023-07-10

## 2023-07-09 MED ORDER — ACETAMINOPHEN 325 MG TABLET
975.0000 mg | ORAL_TABLET | Freq: Once | ORAL | Status: DC | PRN
Start: 2023-07-09 — End: 2023-07-10

## 2023-07-09 MED ORDER — FAMOTIDINE (PF) 20 MG/2 ML INTRAVENOUS SOLUTION
20.0000 mg | Freq: Once | INTRAVENOUS | Status: DC | PRN
Start: 2023-07-09 — End: 2023-07-10

## 2023-07-09 MED ORDER — SODIUM CHLORIDE 0.9 % INTRAVENOUS SOLUTION
INTRAVENOUS | Status: DC
Start: 2023-07-09 — End: 2023-07-10
  Filled 2023-07-09: qty 50

## 2023-07-09 MED ORDER — DEXTROSE 5% IN WATER (D5W) FLUSH BAG - 250 ML
INTRAVENOUS | Status: DC | PRN
Start: 2023-07-09 — End: 2023-07-10

## 2023-07-09 MED ORDER — ALBUTEROL SULFATE 2.5 MG/3 ML (0.083 %) SOLUTION FOR NEBULIZATION
2.5000 mg | INHALATION_SOLUTION | Freq: Once | RESPIRATORY_TRACT | Status: DC | PRN
Start: 2023-07-09 — End: 2023-07-10

## 2023-07-09 MED ORDER — SODIUM CHLORIDE 0.9 % IV BOLUS
500.0000 mL | INJECTION | Freq: Once | Status: DC | PRN
Start: 2023-07-09 — End: 2023-07-10

## 2023-07-09 MED ORDER — EPINEPHRINE 1 MG/ML (1 ML) INJECTION SOLUTION
0.3000 mg | Freq: Once | INTRAMUSCULAR | Status: DC | PRN
Start: 2023-07-09 — End: 2023-07-10

## 2023-07-09 MED ORDER — PROCHLORPERAZINE MALEATE 5 MG TABLET
10.0000 mg | ORAL_TABLET | Freq: Once | ORAL | Status: DC | PRN
Start: 2023-07-09 — End: 2023-07-10

## 2023-07-09 MED ORDER — SODIUM CHLORIDE 0.9% FLUSH BAG - 250 ML
INTRAVENOUS | Status: DC | PRN
Start: 2023-07-09 — End: 2023-07-10

## 2023-07-09 MED ORDER — LORATADINE 10 MG TABLET
20.0000 mg | ORAL_TABLET | Freq: Once | ORAL | Status: DC | PRN
Start: 2023-07-09 — End: 2023-07-10

## 2023-07-09 MED ORDER — SODIUM CHLORIDE 0.9 % INTRAVENOUS SOLUTION
300.0000 mg | Freq: Once | INTRAVENOUS | Status: AC
Start: 2023-07-09 — End: 2023-07-09
  Administered 2023-07-09: 0 mg via INTRAVENOUS
  Administered 2023-07-09: 300 mg via INTRAVENOUS
  Filled 2023-07-09: qty 15

## 2023-07-09 MED ORDER — ALBUTEROL SULFATE HFA 90 MCG/ACTUATION AEROSOL INHALER
2.0000 | INHALATION_SPRAY | Freq: Once | RESPIRATORY_TRACT | Status: DC | PRN
Start: 2023-07-09 — End: 2023-07-10

## 2023-07-09 NOTE — Progress Notes (Signed)
 1005: Patient arrived to clinic for treatment per tx plan and MAR. PIV started per AVITAR. Patient denies complaints.Joannie Muff, RN  [phone]: Venofer infusion verified and started per TP and MAR at time documented on MAR. Blood return present. Tolerating well. Joannie Muff, RN  [phone]: Infusion complete per TP and MAR at time documented on [facility].. Tolerated well. Blood return present. Line flushing. No s/s hypersensitivity reaction. On 30" observation -Joannie Muff, RN  [phone]: Treatment completed as noted. Observed for 30" post infusion. The patient voices no questions or concerns regarding today's treatment.  PIV removed per AVITAR. Discharged in NAD.  Joannie Muff, RN

## 2023-07-12 ENCOUNTER — Encounter (INDEPENDENT_AMBULATORY_CARE_PROVIDER_SITE_OTHER): Payer: Self-pay | Admitting: Family

## 2023-07-15 ENCOUNTER — Encounter (INDEPENDENT_AMBULATORY_CARE_PROVIDER_SITE_OTHER): Payer: Self-pay | Admitting: DIETICIAN-REGISTERED

## 2023-07-16 ENCOUNTER — Ambulatory Visit
Admission: RE | Admit: 2023-07-16 | Discharge: 2023-07-16 | Disposition: A | Discharge status: Home / Self Care | Payer: Self-pay | Source: Ambulatory Visit

## 2023-07-16 ENCOUNTER — Other Ambulatory Visit: Payer: Self-pay

## 2023-07-16 VITALS — BP 106/77 | HR 82 | Temp 99.3°F | Resp 20 | Ht 64.0 in | Wt 103.2 lb

## 2023-07-16 DIAGNOSIS — E611 Iron deficiency: Secondary | ICD-10-CM | POA: Insufficient documentation

## 2023-07-16 MED ORDER — SODIUM CHLORIDE 0.9 % INTRAVENOUS SOLUTION
INTRAVENOUS | Status: DC
Start: 2023-07-16 — End: 2023-07-17
  Filled 2023-07-16: qty 50

## 2023-07-16 MED ORDER — ALBUTEROL SULFATE HFA 90 MCG/ACTUATION AEROSOL INHALER
2.0000 | INHALATION_SPRAY | Freq: Once | RESPIRATORY_TRACT | Status: DC | PRN
Start: 2023-07-16 — End: 2023-07-17

## 2023-07-16 MED ORDER — SODIUM CHLORIDE 0.9 % INTRAVENOUS SOLUTION
300.0000 mg | Freq: Once | INTRAVENOUS | Status: AC
Start: 2023-07-16 — End: 2023-07-16
  Administered 2023-07-16: 0 mg via INTRAVENOUS
  Administered 2023-07-16: 300 mg via INTRAVENOUS
  Filled 2023-07-16: qty 15

## 2023-07-16 MED ORDER — DEXTROSE 5% IN WATER (D5W) FLUSH BAG - 250 ML
INTRAVENOUS | Status: DC | PRN
Start: 2023-07-16 — End: 2023-07-17

## 2023-07-16 MED ORDER — SODIUM CHLORIDE 0.9% FLUSH BAG - 250 ML
INTRAVENOUS | Status: DC | PRN
Start: 2023-07-16 — End: 2023-07-17

## 2023-07-16 MED ORDER — SODIUM CHLORIDE 0.9 % IV BOLUS
500.0000 mL | INJECTION | Freq: Once | Status: DC | PRN
Start: 2023-07-16 — End: 2023-07-17

## 2023-07-16 MED ORDER — EPINEPHRINE 1 MG/ML (1 ML) INJECTION SOLUTION
0.3000 mg | Freq: Once | INTRAMUSCULAR | Status: DC | PRN
Start: 2023-07-16 — End: 2023-07-17

## 2023-07-16 MED ORDER — LORATADINE 10 MG TABLET
20.0000 mg | ORAL_TABLET | Freq: Once | ORAL | Status: DC | PRN
Start: 2023-07-16 — End: 2023-07-17

## 2023-07-16 MED ORDER — PROCHLORPERAZINE MALEATE 5 MG TABLET
10.0000 mg | ORAL_TABLET | Freq: Once | ORAL | Status: DC | PRN
Start: 2023-07-16 — End: 2023-07-17

## 2023-07-16 MED ORDER — FAMOTIDINE (PF) 20 MG/2 ML INTRAVENOUS SOLUTION
20.0000 mg | Freq: Once | INTRAVENOUS | Status: DC | PRN
Start: 2023-07-16 — End: 2023-07-17

## 2023-07-16 MED ORDER — ACETAMINOPHEN 325 MG TABLET
975.0000 mg | ORAL_TABLET | Freq: Once | ORAL | Status: DC | PRN
Start: 2023-07-16 — End: 2023-07-17

## 2023-07-16 MED ORDER — HYDROCORTISONE SOD SUCCINATE 100 MG/2 ML VIAL WRAPPER
100.0000 mg | Freq: Once | INTRAMUSCULAR | Status: DC | PRN
Start: 2023-07-16 — End: 2023-07-17

## 2023-07-16 MED ORDER — ALBUTEROL SULFATE 2.5 MG/3 ML (0.083 %) SOLUTION FOR NEBULIZATION
2.5000 mg | INHALATION_SOLUTION | Freq: Once | RESPIRATORY_TRACT | Status: DC | PRN
Start: 2023-07-16 — End: 2023-07-17

## 2023-07-16 NOTE — Progress Notes (Signed)
 1312: Patient arrived to clinic for treatment per tx plan and MAR. PIV started per AVITAR. Patient denies complaints.Joannie Muff, RN  [phone]: iron  sucrose (VENOFER ) 300 mg in NS 250 mL IVPB  infusion verified and started per TP and MAR at time documented on MAR. Blood return present. Tolerating well. Joannie Muff, RN  [phone] : Infusion complete per TP and MAR at time documented on [facility].. Tolerated well. Blood return present. Line flushing. No s/s hypersensitivity reaction. On 30" observation -Joannie Muff, RN  [phone]: Treatment completed as noted. Observed for 30" post infusion. The patient voices no questions or concerns regarding today's treatment.  PIV removed per AVITAR. Discharged in NAD.  Joannie Muff, RN

## 2023-07-22 ENCOUNTER — Ambulatory Visit (INDEPENDENT_AMBULATORY_CARE_PROVIDER_SITE_OTHER): Payer: Self-pay | Admitting: Student in an Organized Health Care Education/Training Program

## 2023-07-23 ENCOUNTER — Other Ambulatory Visit: Payer: Self-pay

## 2023-07-23 ENCOUNTER — Ambulatory Visit
Admission: RE | Admit: 2023-07-23 | Discharge: 2023-07-23 | Disposition: A | Discharge status: Home / Self Care | Payer: Self-pay | Source: Ambulatory Visit

## 2023-07-23 VITALS — BP 106/75 | HR 80 | Temp 98.3°F | Resp 18 | Ht 64.0 in | Wt 100.0 lb

## 2023-07-23 DIAGNOSIS — E611 Iron deficiency: Secondary | ICD-10-CM | POA: Insufficient documentation

## 2023-07-23 MED ORDER — ALBUTEROL SULFATE HFA 90 MCG/ACTUATION AEROSOL INHALER
2.0000 | INHALATION_SPRAY | Freq: Once | RESPIRATORY_TRACT | Status: DC | PRN
Start: 2023-07-23 — End: 2023-07-24

## 2023-07-23 MED ORDER — PROCHLORPERAZINE MALEATE 5 MG TABLET
10.0000 mg | ORAL_TABLET | Freq: Once | ORAL | Status: DC | PRN
Start: 2023-07-23 — End: 2023-07-24

## 2023-07-23 MED ORDER — SODIUM CHLORIDE 0.9 % INTRAVENOUS SOLUTION
INTRAVENOUS | Status: DC
Start: 2023-07-23 — End: 2023-07-24
  Filled 2023-07-23: qty 50

## 2023-07-23 MED ORDER — HYDROCORTISONE SOD SUCCINATE 100 MG/2 ML VIAL WRAPPER
100.0000 mg | Freq: Once | INTRAMUSCULAR | Status: DC | PRN
Start: 2023-07-23 — End: 2023-07-24

## 2023-07-23 MED ORDER — ACETAMINOPHEN 325 MG TABLET
975.0000 mg | ORAL_TABLET | Freq: Once | ORAL | Status: DC | PRN
Start: 2023-07-23 — End: 2023-07-24

## 2023-07-23 MED ORDER — SODIUM CHLORIDE 0.9 % INTRAVENOUS SOLUTION
300.0000 mg | Freq: Once | INTRAVENOUS | Status: AC
Start: 2023-07-23 — End: 2023-07-23
  Administered 2023-07-23: 300 mg via INTRAVENOUS
  Administered 2023-07-23: 0 mg via INTRAVENOUS
  Filled 2023-07-23: qty 15

## 2023-07-23 MED ORDER — ALBUTEROL SULFATE 2.5 MG/3 ML (0.083 %) SOLUTION FOR NEBULIZATION
2.5000 mg | INHALATION_SOLUTION | Freq: Once | RESPIRATORY_TRACT | Status: DC | PRN
Start: 2023-07-23 — End: 2023-07-24

## 2023-07-23 MED ORDER — SODIUM CHLORIDE 0.9% FLUSH BAG - 250 ML
INTRAVENOUS | Status: DC | PRN
Start: 2023-07-23 — End: 2023-07-24

## 2023-07-23 MED ORDER — SODIUM CHLORIDE 0.9 % IV BOLUS
500.0000 mL | INJECTION | Freq: Once | Status: DC | PRN
Start: 2023-07-23 — End: 2023-07-24

## 2023-07-23 MED ORDER — EPINEPHRINE 1 MG/ML (1 ML) INJECTION SOLUTION
0.3000 mg | Freq: Once | INTRAMUSCULAR | Status: DC | PRN
Start: 2023-07-23 — End: 2023-07-24

## 2023-07-23 MED ORDER — DEXTROSE 5% IN WATER (D5W) FLUSH BAG - 250 ML
INTRAVENOUS | Status: DC | PRN
Start: 2023-07-23 — End: 2023-07-24

## 2023-07-23 MED ORDER — LORATADINE 10 MG TABLET
20.0000 mg | ORAL_TABLET | Freq: Once | ORAL | Status: DC | PRN
Start: 2023-07-23 — End: 2023-07-24

## 2023-07-23 MED ORDER — FAMOTIDINE (PF) 20 MG/2 ML INTRAVENOUS SOLUTION
20.0000 mg | Freq: Once | INTRAVENOUS | Status: DC | PRN
Start: 2023-07-23 — End: 2023-07-24

## 2023-07-23 NOTE — Progress Notes (Signed)
 1231: Patient arrived to clinic for treatment per tx plan and MAR. PIV started per AVITAR. Patient denies complaints.Joannie Muff, RN    1302: iron  sucrose (VENOFER ) 300 mg in NS 250 mL IVPB  infusion verified and started per TP and MAR at time documented on MAR. Blood return present. Tolerating well. Joannie Muff, RN     [phone]: Infusion complete per TP and MAR at time documented on [facility].. Tolerated well. Blood return present. Line flushing. No s/s hypersensitivity reaction. On 30 observation -Joannie Muff, RN    [phone]: Treatment completed as noted. Observed for 30 post infusion. The patient voices no questions or concerns regarding today's treatment.  PIV removed per AVITAR. Discharged in NAD.  Joannie Muff, RN

## 2023-07-24 ENCOUNTER — Encounter (INDEPENDENT_AMBULATORY_CARE_PROVIDER_SITE_OTHER): Payer: Self-pay | Admitting: Family

## 2023-07-24 ENCOUNTER — Ambulatory Visit: Attending: Family | Admitting: DIETICIAN-REGISTERED

## 2023-07-24 ENCOUNTER — Other Ambulatory Visit: Payer: Self-pay

## 2023-07-24 DIAGNOSIS — Z713 Dietary counseling and surveillance: Secondary | ICD-10-CM | POA: Insufficient documentation

## 2023-07-24 DIAGNOSIS — K3184 Gastroparesis: Secondary | ICD-10-CM

## 2023-07-24 DIAGNOSIS — Z681 Body mass index (BMI) 19 or less, adult: Secondary | ICD-10-CM

## 2023-07-24 NOTE — Progress Notes (Signed)
 TELEMEDICINE DOCUMENTATION:    Patient Location:  MyChart video visit from [facility]     Patient/family aware of provider location:  yes  Patient/family consent for telemedicine:  yes  Examination observed and performed by:  Asuncion Blanc, RDLD     Medical Nutrition Therapy Follow up    Name: Terica Proby  Date of Service: 07/24/2023  MRN: [id number]  Referring Provider:  Lemar Pyles APRN,NP-C    Clinic Visit: [facility]    Comments: She stated it is not going very well. She had 3 iron  infusions last couple weeks. She stated she has still not been able to eat. She was writing down what she is eating. She is not finding consistenting with what she is eating. In the morning, she used drink hot tea but making her sick. She did have baked steak and mashed potatoes one day but she ended up throwing it up 30 minutes later. She got a baked potato with butter, sour cream ate some of the skin and mac & cheese. She stated she threw it up almost immediately and every single bit came up. She has been trying to drink milkshakes but makes her stomach hurt. She is taking applesauce in the mornng with omeprazole . Couple times she has reguirtated it. During her first iron  infusion she got lunch- rosemary chicken and mashed potato. She thinks they chopped it and ground it with a little bit of gravy. She stated consistency of it with swallowing wise was good but was able to tolerate it but got sick when she got home. Pt stated she got roast beef ate a little bit and when she got home she threw it up. She had leftover with roast beef and mash potatoes. She has tried to do activia and pudding for softer foods. She has been trying to watch the fiber food but trying to get in what she can. She stated she is eating ice cream at night and gets it down. She stated she will reguritate a little bit. She stated can't do milkshake all at one time. The cold makes it worse for her. She tried pizza. She  bought lactose free vanilla ice cream yesterday. She stated she gets really upset with wanting to eat. She was not able to get a ride to her appt with Dr.Kraft. She does not want to get under 100# and tired of being sick. She stated consistency does not seem to matter. She can't do potato chips. She stated she has not given up on eating after throwing up. She went to [employer] got burger, fries, milkshake. Benedetta Bradley would not going down. She is scared she is going to choke. She is starting OT and PT today and hoping to get a little strength. She has speech therapy appt at the end of month. She still has the orgain protein powder. She got the little pouches of applesauce and couple of the naked drinks. She stated she cannot afford them though. She got pudding, v8 drinks-strawberry and banana. It did not seem to matter. A lot of time she does eat and go to bed. She stated she is getting dizzy again when she eats that had happened years ago. She had bowl of ice cream lactose free. She was going to get almond milk. She stated rather tried to eat out than at home because she feels like she will eat more of it due to money. She stated she does not like canned fruit. Banana made her sick when she  added it to her shake.     Diet Recall:   Milkshakes, ice cream, trying to some meals but ends up throwing them up  Beverages-water ; cup or 2 of tea a day;         Anthropometric Measurements based on vitals 07/23/23:  Height: 64 Weight: 45.4kg(100#)  IBW: 54.5 kg Percent IBW: 83.3 %    BMI: 17.16(Underweight- BMI below 18.5)  *-10.2#(9.2%) since vitals on 06/10/23        Estimated Needs:  No change    Pertinent Labs to Visit:   Abnormal labs on 06/29/23: Iron  28; Iron  Saturation 8    Nutrition Diagnosis: Altered GI function ; related to Current medical condition ; as evidenced by pt interview and difficulty with nutrition       Medical History:   Past Medical History:   Diagnosis Date    Autoimmune disorder (CMS HCC)     Awareness  under anesthesia     Cervicocranial syndrome     Chiari I malformation (CMS HCC)     Chronic pain     Convergence insufficiency     Dysautonomia (CMS HCC)     Ehlers-Danlos syndrome     Endometriosis     Esophageal reflux     Headache     History of anesthesia complications     patient states make sure you give me enough medication to go to sleep and stay asleep or I will wake up during procedure    POTS (postural orthostatic tachycardia syndrome)     Scoliosis     Shortness of breath     Thyroid  nodule            Surgical History:  Past Surgical History:   Procedure Laterality Date    CHOLECYSTECTOMY LAPAROSCOPIC (SILS) N/A 05/05/2018    Performed by Corina Dibble, MD at [facility] OR MAIN    ESOPHAGEAL DILATION      GASTROSCOPY WITH DILATION N/A 01/17/2023    Performed by Oscar Blazing, MD at [facility] OR ENDO    HX CHOLECYSTECTOMY      HX COLONOSCOPY      HX LAP CHOLECYSTECTOMY      HX MYOMECTOMY      HX WISDOM TEETH EXTRACTION      MANOMETRY ESOPHAGEAL N/A 05/29/2023    Performed by Glennda Langton, MD at [facility] OR ENDO    POSTERIOR FOSSA DECOMPRESSION      2017    REMOVAL INFUSAPORT Right 08/27/2017    Performed by Corina Dibble, MD at [facility] OR MAIN       Current medications:   Current Outpatient Medications   Medication Sig    albuterol  sulfate (PROVENTIL  OR VENTOLIN  OR PROAIR ) 90 mcg/actuation Inhalation oral inhaler Take 2 Puffs by inhalation Every 4 hours as needed    aspirin-acetaminophen -caffeine (EXCEDRIN) 250-250-65 mg Oral Tablet Take 2 Tablets by mouth Every 6 hours as needed for Migraine    capsaicin  (ZOSTRIX) 0.025 % Cream Apply topically Twice daily    cetirizine  (ZYRTEC ) 10 mg Oral Tablet Take 1 Tablet (10 mg total) by mouth Twice daily    clobetasoL-gauze-silicone 0.05 %- 4 X 4 Apply externally Kit Apply topically    clonazePAM (KLONOPIN) 0.5 mg Oral Tablet Take 1 Tablet (0.5 mg total) by mouth Twice daily (Patient taking differently: Take 1 Tablet (0.5 mg total) by mouth Three times a day)    cyclobenzaprine  (FLEXERIL) 10 mg Oral Tablet Take 1 Tablet (10 mg total) by mouth Every 8 hours as  needed for Muscle spasms    diphenhydrAMINE  (BENADRYL ) 25 mg Oral Capsule Take 1 Capsule (25 mg total) by mouth Once per day as needed for Itching    famotidine  (PEPCID ) 40 mg Oral Tablet Take 1 Tablet (40 mg total) by mouth Twice per day as needed Indications: heartburn    hydrOXYzine  HCL (ATARAX ) 25 mg Oral Tablet Take 2 Tablets (50 mg total) by mouth Three times a day    lamoTRIgine (LAMICTAL) 100 mg Oral Tablet Take 1 Tablet (100 mg total) by mouth Twice daily    lidocaine  (LIDODERM ) 5 % Adhesive Patch, Medicated APPLY 1 PATCH BY TOPICAL ROUTE ONCE DAILY (MAY WEAR UP TO 12HOURS.) (Patient not taking: Reported on 06/19/2023)    lidocaine  (XYLOCAINE ) 5 % Ointment APPLY TO AFFECTED AREA 1-4 TIMES DAILY AS NEEDED (Patient not taking: Reported on 06/19/2023)    omeprazole  (PRILOSEC) 40 mg Oral Capsule, Delayed Release(E.C.) Take 1 Capsule (40 mg total) by mouth Daily 30 min before breakfast.  Ok to open capsule and take in applesauce.    ondansetron  (ZOFRAN  ODT) 4 mg Oral Tablet, Rapid Dissolve Take 1 Tab (4 mg total) by mouth Every 8 hours as needed for Nausea/Vomiting    sodium chloride  (SALINE NOSE NASL) by Nasal route    Tacrolimus 0.1 % Ointment Apply topically Twice daily    traZODone  (DESYREL ) 50 mg Oral Tablet Take 1 Tablet (50 mg total) by mouth Every night as needed for Insomnia    TURMERIC ORAL Take by mouth Once a day       Nutrition Intervention: She stated it is not going very well. She had 3 iron  infusions last couple weeks. She stated she has still not been able to eat. She was writing down what she is eating. She is not finding consistenting with what she is eating. In the morning, she used drink hot tea but making her sick. She did have baked steak and mashed potatoes one day but she ended up throwing it up 30 minutes later. She got a baked potato with butter, sour cream ate some of the skin and mac & cheese. She stated  she threw it up almost immediately and every single bit came up. She has been trying to drink milkshakes but makes her stomach hurt. She is taking applesauce in the mornng with omeprazole . Couple times she has reguirtated it. During her first iron  infusion she got lunch- rosemary chicken and mashed potato. She thinks they chopped it and ground it with a little bit of gravy. She stated consistency of it with swallowing wise was good but was able to tolerate it but got sick when she got home. Pt stated she got roast beef ate a little bit and when she got home she threw it up. She had leftover with roast beef and mash potatoes. She has tried to do activia and pudding for softer foods. She has been trying to watch the fiber food but trying to get in what she can. She stated she is eating ice cream at night and gets it down. She stated she will reguritate a little bit. She stated can't do milkshake all at one time. The cold makes it worse for her. She tried pizza. She bought lactose free vanilla ice cream yesterday. She stated she gets really upset with wanting to eat. She was not able to get a ride to her appt with Dr.Kraft. She does not want to get under 100# and tired of being sick. She stated consistency does not  seem to matter. She can't do potato chips. She stated she has not given up on eating after throwing up. She went to [employer] got burger, fries, milkshake. Benedetta Bradley would not going down. She is scared she is going to choke. She is starting OT and PT today and hoping to get a little strength. She has speech therapy appt at the end of month. She still has the orgain protein powder. She got the little pouches of applesauce and couple of the naked drinks. She stated she cannot afford them though. She got pudding, v8 drinks-strawberry and banana. It did not seem to matter. A lot of time she does eat and go to bed. She stated she is getting dizzy again when she eats that had happened years ago. She had bowl of ice  cream lactose free. She was going to get almond milk. She stated rather tried to eat out than at home because she feels like she will eat more of it due to money. She stated she does not like canned fruit. Banana made her sick when she added it to her shake.     RD discussed with pt on trying to add her protein powder to her ice cream since she is able to tolerate it better. Discussed with pt on trying plant based ice cream to see if she can tolerate it. Pt stated she does have family members who are lactose intolerant. 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 She is going to work on journaling her intake better. RD mailing pt 2 journal examples that may help her. Pt stated she does work with a Paramedic. RD also reached out to referring provider on possibly getting pt referred to social worker due lack of transportation and money. Follow-up appt scheduled for 08/19/23 to see how she is doing. Pt encouraged to reach out should she have any questions.       Nutrition Prescription (recommendation)/Goals:    Previous Goals set on 06/16/23:  1) Journal food intake, symptoms, and feelings     Today's Goals:  1) Try mixing ice cream with protein  2) Try plant based ice cream  3) Work on calorie intake       Nutrition Monitoring and Evaluation:  1) Continue to monitor weight  2) Evaluate lifestyle modifications  3) Encourage continued modifications to promote healthy weight gain    64 minutes: 100% face to face counseling time.    Lenice Quill, MS, RD, LD  Outpatient Dietitian

## 2023-08-08 ENCOUNTER — Ambulatory Visit (HOSPITAL_BASED_OUTPATIENT_CLINIC_OR_DEPARTMENT_OTHER): Payer: Self-pay | Admitting: Speech-Language Pathologist

## 2023-08-19 ENCOUNTER — Ambulatory Visit: Admitting: DIETICIAN-REGISTERED

## 2023-09-12 ENCOUNTER — Other Ambulatory Visit: Payer: Self-pay

## 2023-09-12 ENCOUNTER — Other Ambulatory Visit (HOSPITAL_COMMUNITY): Payer: Self-pay | Admitting: PHYSICAL MEDICINE AND REHABILITATION

## 2023-09-12 ENCOUNTER — Ambulatory Visit
Admission: RE | Admit: 2023-09-12 | Discharge: 2023-09-12 | Disposition: A | Discharge status: Home / Self Care | Source: Ambulatory Visit | Attending: PHYSICAL MEDICINE AND REHABILITATION | Admitting: PHYSICAL MEDICINE AND REHABILITATION

## 2023-09-12 DIAGNOSIS — M25531 Pain in right wrist: Secondary | ICD-10-CM

## 2023-09-16 ENCOUNTER — Ambulatory Visit (INDEPENDENT_AMBULATORY_CARE_PROVIDER_SITE_OTHER): Payer: Self-pay | Admitting: Student in an Organized Health Care Education/Training Program

## 2023-09-23 ENCOUNTER — Other Ambulatory Visit: Payer: Self-pay

## 2023-09-23 ENCOUNTER — Encounter (HOSPITAL_BASED_OUTPATIENT_CLINIC_OR_DEPARTMENT_OTHER): Payer: Self-pay

## 2023-09-23 ENCOUNTER — Ambulatory Visit: Attending: Speech-Language Pathologist | Admitting: Speech-Language Pathologist

## 2023-09-23 DIAGNOSIS — R634 Abnormal weight loss: Secondary | ICD-10-CM | POA: Insufficient documentation

## 2023-09-23 DIAGNOSIS — R1310 Dysphagia, unspecified: Secondary | ICD-10-CM | POA: Insufficient documentation

## 2023-09-23 DIAGNOSIS — R1312 Dysphagia, oropharyngeal phase: Secondary | ICD-10-CM | POA: Insufficient documentation

## 2023-09-23 NOTE — Progress Notes (Signed)
 Speech-Language Pathology Evaluation    [facility]  [address] [place], [place] [address]  [phone]   (Fax) [phone]  Speech Therapy   Swallow Evaluation/Plan of Care    Patient Name: Holly Fritz  Date of Birth: 1980/02/28  Payor: NELL Hoffman / Plan: [facility] MEDICAID / Product Type: Medicaid [place] /     Date/Time: 09/23/2023  3:30pm  Medical Diagnosis:  Dysphagia [R13.10];Weight loss [R63.4]  Treatment Diagnosis:dysphagia    ICD-10-CM    1. Oropharyngeal dysphagia  R13.12       2. Dysphagia  R13.10       3. Weight loss  R63.4           Onset date: this encounter order date of 06/17/2023  Referring Provider:Stockdale, Lauraine, APRN,*    Evaluation date: 09/23/2023  Re-certification due: 12/22/2023  Insurance authorization: 10 visits thru 02/11/2024    Visit#: 1    HPI:     Holly Fritz is a 43 y.o. female who presents for a clinical swallow evaluation and assessment of stimulability for a swallow exercise protocol.    Past Medical History:   Diagnosis Date    Autoimmune disorder (CMS HCC)     Awareness under anesthesia     Cervicocranial syndrome     Chiari I malformation (CMS HCC)     Chronic pain     Convergence insufficiency     Dysautonomia (CMS HCC)     Ehlers-Danlos syndrome     Endometriosis     Esophageal reflux     Headache     History of anesthesia complications     patient states make sure you give me enough medication to go to sleep and stay asleep or I will wake up during procedure    POTS (postural orthostatic tachycardia syndrome)     Scoliosis     Shortness of breath     Thyroid  nodule              Subjective:   Patient presents for an outpatient clinical swallowing assessment and treatment planning. Pleasant and cooperative.   Patient's Current PO status: restricted and inconsistent  per referring GI service office note: (italicized)  Interval History:   Onset of symptoms around 2015. 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  Was enrolled in GI pain program at one point.  Had dietician and TNP 2017-2018.   Initially dizziness and presyncopal during meals. S/P Chole, 2020.  Had relief of GI symptoms for about 1 yr. Reports having unremarkable colonoscopy around 2018.  No records presently available.   Pt is under a significant amount of stress (mom recently passed, caregiver for g-ma, recently divorced.) In grief counseling. Good support system. GI symptoms preceded stressors.    Summary of prior testing:  11-24-15 NM Esophageal Motility: pos for delayed esophageal emptying, neg for delayed gastric emptying, neg for delayed small bowel emptying, pos for delayed colonic emptying diffusely and in colon and rectosigmoid (results reviewed on Pts phone)  Oct 2024 XR KUB:  Unremarkable  Oct 2024 Elastase: normal   Nov 2024 BAS: no reflux, mild esophageal dysmotility   Dec 2024 EGD:  Normal esophagus, dilated.  Normal stomach, normal ampulla, normal duodenal.  Path:  Squamous mucosa with reactive changes proximal and distal esophagus  [name] 2025 Abd Duplex: normal  [name] 2025 Gastric Emptying Study: T 1/2 time 137 min, mild delayed emptying   [name]  2025 SIBO breath test: results pending   [name] 2025 Esophageal Manometry: results pending      Continues to have post prandial upper abd pain, fullness, swelling, bloating and sometimes N/V within 30 min of eating.   Often eats very little through the day and more in evening.   Mentions choosing cheeseburger, ice cream, ribs.   Struggles keeping down protein shake.   C/O dizziness and air hunger.   Met with dietician Keyser in the past for wt loss, but not since Dx of Gastroparesis.   Poor tolerance to oral iron .  Requests labs rechecked today.   Recent CBC normal.   Loose stool up to 10 x per day, often post prandial but will go without eating as well.   Uses THC each morning 2-3 hits to abort diarrhea.     Objective:   In  addition to above, patient reported: has jackhammer esophagus, foods feels stuck at level of top of sternum; voice feels trained after a lot of talking; has nasal regurgitation at times when eating; has not had ENT referral. Vomiting continues; sees undigested food with this even if vomiting is delayed.     Oxygenation: N/A   Oral Cavity: Dry and Minimal Mucous Build Up   Oral Care:good   Dentition: natural   Lingual/Labial: weak anterior tongue push strength against resistance   Presented: Water  and pudding and cookie   Oral Phase: A/P Transit WFL per pharyngeal swallow onset  Pharyngeal Phase: Laryngeal Elevation WFL and Swallow Response Timely for items in assessment. No vocal changes after any intake. Patient did ID feeling of residue at level of sternum.   Patient Education: regarding general plan for therapy including frequency; to please schedule ENT appointment; contact information provided to assist contacting ENT.     Assessment:   Impressions: No Overt S/S of Aspiration in this Setting   Rehab Potential: unknown    CONFIRMATION OF MEDICAL NECESSITY   Based on current diagnosis, functional performance prior to admission, and current  functional performance, this patient requires continued Speech services   in Outpatient Therapy in order to achieve significant functional improvements in these   deficit areas: Swallowing.         Goals:     Short term goals:   1. Safe and adequate po intake at least restrictive diet level to meet daily nutritive and hydration needs.   2. Strict adherence to aspiration precautions at 95-100% compliance.   3. Strict adherence to acid reflux management guidelines at 95-100% compliance.   4. Complete ENT consult.  5. Patient will earn, use swallowing strengthening exercises with daily compliance.   6. Patient will learn, use esophageal dysphagia technique with daily compliance.     Long term goal:  Adequate and safe PO intake and swallow function to meet daily nutritional and  hydration needs.     Plan:   Recommendations:continue at any diet level tolerable; continue to work with dietician; consult with ENT. begin speech/swallow therapy.    Aspiration Precautions: Small bites, Small sips, Swallow each bite/sip before taking next, Multiple swallows, and Remain upright after meals for  60 minutes after eating  Results & Recommendations Discussed With:Patient   Plan to follow patient 1x a day;  1 x/every other week for 12 weeks; from 09/23/2023 to 12/22/2023  The risks/benefits of therapy have been discussed with the patient/caregiver and he/she is in agreement with the established plan of care.     Therapist:   Cornelia Walraven, MS, CCC-SLP  Evaluation Time: 60 minutes  Start time:3:40pm  Stop time:4:40pm

## 2023-09-30 ENCOUNTER — Ambulatory Visit

## 2023-09-30 ENCOUNTER — Other Ambulatory Visit: Payer: Self-pay

## 2023-09-30 ENCOUNTER — Encounter (INDEPENDENT_AMBULATORY_CARE_PROVIDER_SITE_OTHER): Payer: Self-pay

## 2023-09-30 VITALS — BP 118/80 | HR 72 | Temp 98.0°F | Ht 60.0 in | Wt 110.4 lb

## 2023-09-30 DIAGNOSIS — S60425A Blister (nonthermal) of left ring finger, initial encounter: Secondary | ICD-10-CM | POA: Insufficient documentation

## 2023-09-30 DIAGNOSIS — T148XXA Other injury of unspecified body region, initial encounter: Secondary | ICD-10-CM | POA: Insufficient documentation

## 2023-09-30 MED ORDER — CEPHALEXIN 500 MG CAPSULE
500.0000 mg | ORAL_CAPSULE | Freq: Four times a day (QID) | ORAL | 0 refills | Status: AC
Start: 2023-09-30 — End: 2023-10-07

## 2023-09-30 NOTE — Progress Notes (Signed)
 [facility]  [address]  [place] [id number]  Operated by [facility]     Name: Holly Fritz MRN:  [id number]   Date of Birth: [birth year] Age: 43 y.o.   Date: 09/30/2023  Time: 17:44     Provider: Dorna Imam, PA-C  PCP: [place] Taylor Hott, NP    Reason for visit:   Chief Complaint              Bump Bump on left 4th finger-            Vitals:   Vitals:    09/30/23 1646   BP: 118/80   Pulse: 72   Temp: 36.7 C (98 F)   SpO2: 96%   Weight: 50.1 kg (110 lb 6.4 oz)   Height: 1.524 m (5')   BMI: 21.56        History of Present Illness:  Holly Fritz is a 43 y.o. female presenting with a blood blister on the left ring finger.  She denies recent trauma or injury.  Reports mild surrounding erythema and increased warmth.  No drainage, streaking, or systemic symptoms reported.    Past Medical History:  Past Medical History:   Diagnosis Date    Autoimmune disorder (CMS HCC)     Awareness under anesthesia     Cervicocranial syndrome     Chiari I malformation (CMS HCC)     Chronic pain     Convergence insufficiency     Dysautonomia (CMS HCC)     Ehlers-Danlos syndrome     Endometriosis     Esophageal reflux     Headache     History of anesthesia complications     patient states make sure you give me enough medication to go to sleep and stay asleep or I will wake up during procedure    POTS (postural orthostatic tachycardia syndrome)     Scoliosis     Shortness of breath     Thyroid  nodule          Past Surgical History:  Past Surgical History:   Procedure Laterality Date    ESOPHAGEAL DILATION      HX CHOLECYSTECTOMY      HX COLONOSCOPY      HX LAP CHOLECYSTECTOMY      HX MYOMECTOMY      HX WISDOM TEETH EXTRACTION      POSTERIOR FOSSA DECOMPRESSION      2017         Allergies:  Allergies[1]  Medications:  Current Outpatient Medications   Medication Sig    albuterol  sulfate (PROVENTIL  OR VENTOLIN  OR PROAIR ) 90 mcg/actuation Inhalation oral inhaler Take 2 Puffs by inhalation Every 4 hours  as needed    aspirin-acetaminophen -caffeine (EXCEDRIN) 250-250-65 mg Oral Tablet Take 2 Tablets by mouth Every 6 hours as needed for Migraine    capsaicin  (ZOSTRIX) 0.025 % Cream Apply topically Twice daily    cephalexin  (KEFLEX ) 500 mg Oral Capsule Take 1 Capsule (500 mg total) by mouth Four times a day for 7 days Indications: an infection of the skin and the tissue below the skin    cetirizine  (ZYRTEC ) 10 mg Oral Tablet Take 1 Tablet (10 mg total) by mouth Twice daily    clobetasoL-gauze-silicone 0.05 %- 4 X 4 Apply externally Kit Apply topically    clonazePAM (KLONOPIN) 0.5 mg Oral Tablet Take 1 Tablet (0.5 mg total) by mouth Twice daily    cyclobenzaprine (FLEXERIL) 10 mg Oral Tablet Take 1 Tablet (  10 mg total) by mouth Every 8 hours as needed for Muscle spasms    diphenhydrAMINE  (BENADRYL ) 25 mg Oral Capsule Take 1 Capsule (25 mg total) by mouth Once per day as needed for Itching    famotidine  (PEPCID ) 40 mg Oral Tablet Take 1 Tablet (40 mg total) by mouth Twice per day as needed Indications: heartburn    hydrOXYzine  HCL (ATARAX ) 25 mg Oral Tablet Take 2 Tablets (50 mg total) by mouth Three times a day    lamoTRIgine (LAMICTAL) 100 mg Oral Tablet Take 1 Tablet (100 mg total) by mouth Twice daily    lidocaine  (LIDODERM ) 5 % Adhesive Patch, Medicated APPLY 1 PATCH BY TOPICAL ROUTE ONCE DAILY (MAY WEAR UP TO 12HOURS.) (Patient not taking: Reported on 06/19/2023)    lidocaine  (XYLOCAINE ) 5 % Ointment APPLY TO AFFECTED AREA 1-4 TIMES DAILY AS NEEDED (Patient not taking: Reported on 06/19/2023)    omeprazole  (PRILOSEC) 40 mg Oral Capsule, Delayed Release(E.C.) Take 1 Capsule (40 mg total) by mouth Daily 30 min before breakfast.  Ok to open capsule and take in applesauce.    ondansetron  (ZOFRAN  ODT) 4 mg Oral Tablet, Rapid Dissolve Take 1 Tab (4 mg total) by mouth Every 8 hours as needed for Nausea/Vomiting    sodium chloride  (SALINE NOSE NASL) by Nasal route    Tacrolimus 0.1 % Ointment Apply topically Twice daily     traZODone  (DESYREL ) 50 mg Oral Tablet Take 1 Tablet (50 mg total) by mouth Every night as needed for Insomnia    TURMERIC ORAL Take by mouth Once a day     Family History:  Family Medical History:       Problem Relation (Age of Onset)    COPD Paternal Grandmother    Congestive Heart Failure Maternal Grandmother    Hypertension (High Blood Pressure) Mother    No Known Problems Father, Sister, Maternal Grandfather, Paternal Grandfather, Daughter, Son, Maternal Aunt, Maternal Uncle, Paternal Aunt, Paternal Uncle, Other    Non-Hodgkin's lymphoma Maternal Grandmother    Primary Brain tumor Mother            Social History:  Social History     Socioeconomic History    Marital status: Legally Separated    Number of children: 0   Occupational History    Occupation: disability    Occupation: Engineer, civil (consulting) farm   Tobacco Use    Smoking status: Every Day     Current packs/day: 0.50     Average packs/day: 0.5 packs/day for 25.6 years (12.8 ttl pk-yrs)     Types: Cigarettes     Start date: 2000     Passive exposure: Current (smoker)    Smokeless tobacco: Never    Tobacco comments:     1.5 ppd   Vaping Use    Vaping status: Never Used   Substance and Sexual Activity    Alcohol use: Not Currently    Drug use: Yes     Types: Marijuana     Comment: daily   Other Topics Concern    Ability to Walk 1 Flight of Steps without SOB/CP Yes    Ability to Walk 2 Flight of Steps without SOB/CP Yes   Social History Narrative    Respiratory history: None    Smoking history: Patient is a half a pack a day smoker since 2000. She also smokes marijuana 2-3 times a day.    Significant secondhand smoke exposure: Mother smoked    Smoking in the home: None  Occupational history: Disabled.    Occupational exposures: No exposures to chemicals, dust, pesticides, fumes or asbestos    The patient denies exposure to farms or raw sewage. Not raising wild birds. No hunting and skinning wild animals.    The patient has not been out country recently or [employer]:     [employer]: Chief Financial Officer: Forensic psychologist: Yes    Humidifier: No    Water : City    Pets in the home: Not currently     Social Determinants of Health     Financial Resource Strain: Low Risk  (05/01/2023)    Financial Resource Strain     SDOH Financial: No   Transportation Needs: Low Risk  (05/01/2023)    Transportation Needs     SDOH Transportation: No   Social Connections: Low Risk  (05/01/2023)    Social Connections     SDOH Social Isolation: 5 or more times a week   Intimate Partner Violence: Low Risk  (05/01/2023)    Intimate Partner Violence     SDOH Domestic Violence: No   Housing Stability: Low Risk  (05/01/2023)    Housing Stability     SDOH Housing Situation: I have housing.     SDOH Housing Worry: No       Review of Systems:  All pertinent Review of System as address in the HPI.    Physical Exam:  General Appearance: Alert and oriented; No acute distress; Cooperative; Appears stated age; nontoxic-appearing; speaking in full sentences  Head: Normocephalic; Atraumatic  Eyes: PERRL; EOMI; Conjunctivae clear  Nose: Nares patent; No drainage appreciated  Neck: Supple; Full ROM  Lungs: Clear to auscultation in all lung fields; No rhonchi; No wheezes    Heart: Regular rate and rhythm; No murmurs appreciated  Skin:  Left ring finger with blood blister present.  Surrounding area shows mild erythema and mild warmth.  No fluctuance, streaking, or drainage.  Range of motion intact.  Capillary refill less than 2 seconds.  Sensation intact distally.  Neurological: Alert and oriented     There are no exam notes on file for this visit.     ABDOMINAL DUPLEX - CELIAC AND SMA  Procedure: A duplex examination of the visceral arteries was performed using 2D with color and spectral Doppler for evaluation of atherosclerotic disease.  Visceral/Mesenteric: Patient was NPO for this study.Normal flow velocities noted in the abdominal aorta, superior mesenteric artery (SMA), and celiac artery.Celiac artery  evaluated on inhalation and exhalation with no determinate difference.  Conclusions: Normal flow velocities noted in the abdominal aorta, superior mesenteric artery (SMA), and celiac artery.Celiac artery evaluated on inhalation and exhalation with no determinate difference.       Assessment:    ICD-10-CM    1. Blood blister  T14.8XXA            Plan:  Orders Placed This Encounter    cephalexin  (KEFLEX ) 500 mg Oral Capsule     Patient will be started on Keflex  to prevent secondary bacterial skin infection.  Patient advised to continue with supportive measures such as keeping the area clean, dry, and avoid manipulation a blister.  Patient educated to monitor for signs of spreading erythema, swelling, purulent discharge, fever, or worsening pain, and advised to seek urgent medical attention if any of these symptoms develop.  Patient advised to follow up with primary care and consider dermatology referral if no improvement with supportive care or if  recurrent lesions develop. Patient advised to seek urgent medical attention if any new or life-threatening symptoms develop.  Patient is agreeable to today's plan and all concerns addressed.    Denis Koppel, PA-C     Portions of this note may be dictated using voice recognition software or a dictation service. Variances in spelling and vocabulary are possible and unintentional. Not all errors are caught/corrected. Please notify the dino if any discrepancies are noted or if the meaning of any statement is not clear.        [1]   Allergies  Allergen Reactions    Adhesive Rash    Doxycycline Rash     Agitation      Propranolol Hives/ Urticaria, Itching and Rash    Ciprofloxacin     Gabapentin   Other Adverse Reaction (Add comment)     Patient reports is sensitive to medication.    Levaquin [Levofloxacin]     Oxycodone      Patient reports sensitivity to medication.    Penicillins      unknown reaction when an infant - HAS TOLERATED KEFLEX     Tapentadol      Patient  reports sensitivity to medication.

## 2023-10-07 ENCOUNTER — Ambulatory Visit (INDEPENDENT_AMBULATORY_CARE_PROVIDER_SITE_OTHER): Payer: Self-pay | Admitting: Family

## 2023-10-20 ENCOUNTER — Encounter (HOSPITAL_BASED_OUTPATIENT_CLINIC_OR_DEPARTMENT_OTHER): Payer: Self-pay | Admitting: Speech-Language Pathologist

## 2023-10-20 ENCOUNTER — Other Ambulatory Visit (INDEPENDENT_AMBULATORY_CARE_PROVIDER_SITE_OTHER): Payer: Self-pay | Admitting: Family

## 2023-10-20 DIAGNOSIS — R1310 Dysphagia, unspecified: Secondary | ICD-10-CM

## 2023-10-20 NOTE — Nurses Notes (Signed)
 Referral to ENT placed per recommendation of SLP: Dr. Honora Molt, laryngology

## 2023-10-21 ENCOUNTER — Ambulatory Visit (INDEPENDENT_AMBULATORY_CARE_PROVIDER_SITE_OTHER): Payer: Self-pay | Admitting: Family

## 2023-10-21 DIAGNOSIS — K3184 Gastroparesis: Secondary | ICD-10-CM

## 2023-10-21 DIAGNOSIS — E611 Iron deficiency: Secondary | ICD-10-CM

## 2023-10-21 DIAGNOSIS — R1310 Dysphagia, unspecified: Secondary | ICD-10-CM

## 2023-10-21 MED ORDER — THERAPEUTIC MULTIVITAMIN ORAL LIQUID: 5.0000 mL | Freq: Every day | ORAL | 1 refills | Status: DC

## 2023-10-21 MED ORDER — OMEPRAZOLE 40 MG CAPSULE,DELAYED RELEASE
40.0000 mg | DELAYED_RELEASE_CAPSULE | Freq: Every day | ORAL | 1 refills | Status: AC
Start: 2023-10-21 — End: ?

## 2023-10-21 NOTE — Progress Notes (Signed)
 GASTROENTEROLOGY/HEPATOLOGY, PHYSICIAN OFFICE CENTER  [address]  [place] [address]  Operated by [facility]  Video Visit     Name: Holly Fritz  MRN: [id number]    Date: 10/21/2023  DOB: 05-Jul-1980 (43 y.o. )                           Patient's location: Home [place] [facility] [id number]   Patient/family aware of provider location: Yes  Patient/family consent for video visit: Yes - switched to telephone d/t lost connection   Interview and observation performed by: Holly Plummer, APRN,NP-C    Chief Complaint: Gastroparesis   History of Present Illness:  Holly Fritz is a 43 y.o. female known to me for gastroparesis and dysphagia.    Interval History:   Onset of symptoms around 2015.   Prev followed with Dr. Oneil Bonito, [facility], for [place] Esophagus and Colonic Dysmotility.  Was enrolled in GI pain program at one point.  Had dietician and TNP 2017-2018.   Initially dizziness and presyncopal during meals.   S/P Chole, 2020.  Had relief of GI symptoms for about 1 yr.   Reports having unremarkable colonoscopy around 2018.  No records presently available.   Pt is under a significant amount of stress (mom recently passed, caregiver for g-ma, recently divorced.) In grief counseling. Good support system. GI symptoms preceded stressors.      Summary of prior testing:  Oct 2017 NM Esophageal Motility: pos for delayed esophageal emptying, neg for delayed gastric emptying, neg for delayed small bowel emptying, pos for delayed colonic emptying diffusely and in colon and rectosigmoid (results reviewed on Pts phone)  Oct 2024 XR KUB:  Unremarkable  Oct 2024 Elastase: normal   Nov 2024 BAS: no reflux, mild esophageal dysmotility   Dec 2024 EGD:  Normal esophagus, dilated.  Normal stomach, normal ampulla, normal duodenal.  Path:  Squamous mucosa with reactive changes proximal and distal esophagus  Holly Fritz 2025 Abd Duplex: normal  Holly Fritz 2025 Gastric Emptying Study: T 1/2 time 137 min, mild  delayed emptying   Holly Fritz 2025 SIBO breath test: results pending   Holly Fritz 2025 Esophageal Manometry: relatively low IRP or LES, intact peristalsis     Today:   Since last visit, has established with Dietician who referred Pt to Speech Therapist who recommend referral to ENT / Larngology. (ordered)  Dysphagia and N/V very inconsistent with no obvious symptom triggers.     States current wt 105 lbs.   Has trouble eating throughout the day.   Consumes most of meals / snacks in evening or immediately prior to bed so she can sleep through post prandial GI distress.   Reports poor diet.  Craves sweets frequently.   States she is not getting enough calories (< 1000 / day.)  Interested in resuming TPN.     Last IV iron  June.  No improvement in fatigue thus far.     Interested in referral to larger motility center, but budget and lack of transportation significant barriers, as she is not working.    No constipation.  One episode rectal bleeding - none since.        Past Medical History:  She has a past medical history of Autoimmune disorder (CMS HCC), Awareness under anesthesia, Cervicocranial syndrome, Chiari I malformation (CMS HCC), Chronic pain, Convergence insufficiency, Dysautonomia (CMS HCC), Ehlers-Danlos syndrome, Endometriosis, Esophageal reflux, Headache, History of anesthesia complications, POTS (postural orthostatic tachycardia syndrome), Scoliosis, Shortness of  breath, and Thyroid  nodule.    She has no past medical history of Angina pectoris, Asthma, Atypical ductal hyperplasia, breast, BRCA1 gene mutation negative, BRCA1 gene mutation positive, BRCA2 gene mutation negative, BRCA2 gene mutation positive, Breast CA, Cancer (CMS HCC), Cervical cancer, Chronic obstructive airway disease, Clotting disorder (CMS HCC), Colon cancer, Convulsions, CVA (cerebrovascular accident), Deep vein thrombosis (DVT), Dysrhythmias, Endometrial cancer (CMS HCC), Essential hypertension, Heart murmur, Hepatic cirrhosis, History of  kidney disease, transfusion, Hyperlipidemia, Malignant hyperthermia, MI (myocardial infarction), Ovarian cancer, Pacemaker, PONV (postoperative nausea and vomiting), Pseudocholinesterase deficiency, Pulmonary embolism, Sleep apnea, Thyroid  disorder, Treatment, Type 2 diabetes mellitus, Upper respiratory infection, Uterine cancer, Viral hepatitis B, or Viral hepatitis C.    Past Surgical History:  She has a past surgical history that includes Posterior fossa decompression; Esophageal dilation; hx wisdom teeth extraction; hx myomectomy; hx lap cholecystectomy; hx gall bladder surgery/chole; and hx colonoscopy.    Problem List:  She has Bacteremia due to coagulase-negative Staphylococcus; Chiari I malformation (CMS HCC); Ehlers-Danlos syndrome; Gastroparesis; Iron  deficiency; and Oropharyngeal dysphagia on their problem list.    Medications:    albuterol  sulfate    aspirin-acetaminophen -caffeine    capsaicin     cetirizine     clobetasoL-gauze-silicone    clonazePAM    cyclobenzaprine    diphenhydrAMINE     famotidine     hydrOXYzine  HCL    lamotrigine    lidocaine     lidocaine     omeprazole     ondansetron     sodium chloride  (SALINE NOSE NASL)    Tacrolimus    traZODone     TURMERIC ORAL     Review of Systems:  As detailed in HPI.  Otherwise neg.     Observational Exam:     General: No acute distress.  Nontoxic appearance.    Neuro: Awake, alert, and oriented X 3.    HEENT: Normocephalic, atraumatic. Sclera non-icteric. Conjunctiva pink.     Lungs: No cough or dyspnea.   Skin: Visualized uniformly pink and without rash, jaundice, or bruising.    Psych: Alert and pleasant.  Stable mood.        Data Reviewed:   As detailed in HPI.     Assessment/Plan:  Very pleasant 43 y/o female known to me for Gastroparesis and Dysphagia.     Dysphagia  - EGD unremarkable for etiology.  No significant relief with empiric dilation   - esophageal manometry unremarkable.   - Cont omeprazole  40 mg daily, 30 minutes prior to breakfast.  Open  capsule, take contents in applesauce  - cont famotidine  40 mg b.i.d. PRN  - continue to avoid NSAIDs, tobacco, alcohol  - cut food into small pieces, chew thoroughly, drink liquids with meals, and remain upright during and 30 min after meals .   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   - Eat small frequent meals (4 - 5 per day) low in fat and nondigestible fiber (fresh fruits and vegetables.)    - Soluble fiber (oat bran, barley, nuts, seeds, beans, lentils, peas, some fruits and vegetables, and psyllium) okay.    - Drink liquids with meals.   - Avoid carbonated beverages which can aggravate gaseous  distention.   - Avoid tobacco and alcohol which as they can decrease antral contractility and delay gastric emptying.   - Written instructions previously provided   - When symptoms are most severe, consume blended or liquid nutrition (Boost, Ensure, Carnation, home made smoothie, ect.)   - Prev discussed Acupuncture for symptom relief.  Unfortunately insurance would not cover.   - we discussed risk vs benefit of prokinetic, metoclopramide , pending QT interval  < 470.  Pt will first discuss with Behavioral Health Specialist, stating she had genetic testing previously that indicated any mediation affecting serotonin levels may be unsafe for her   - Will continue to assess and treat GERD symptoms aggravated by gastroparesis.  PPI, as above.  - We dicussed Buspar vs mirtazepine for gastric accomodation but Pt reports poor reaction to these in the past.   - SIBO breath test ordered - has not yet scheduled   - Pt interested in meeting with Dr. Bernetta, regarding potential candidacy for  G-POEM .  Has to cancel a few appts d/t lack of transportation.  Scheduled 11/11/23.   - Pt is interested in TPN.  Referral placed to [facility] vs Motility Center.  Discussed with Nurse Navigator to help coordinate.       Orders Placed This Encounter    IRON  TRANSFERRIN AND TIBC    Refer to External Provider    multivitamin Oral Liquid    omeprazole  (PRILOSEC) 40 mg Oral Capsule, Delayed Release(E.C.)         RTC in 6 months unless GI care is transitioned to [facility].     The patient was given ample opportunity to ask questions and those questions were answered to the patient's satisfaction. Explanation and counseling provided specific to medications, labs, imaging, endoscopy, as appropriate per patient.  The patient was encouraged to be involved in their own care.  The patient was told to contact me with any additional questions or concerns.  I instructed the patient to use [facility] for messages, or call the office.       Holly CHARLENA Plummer, APRN, NP-C        Department of Medicine                         Section of  Gastroenterology and Hepatology

## 2023-10-22 ENCOUNTER — Telehealth (INDEPENDENT_AMBULATORY_CARE_PROVIDER_SITE_OTHER): Payer: Self-pay

## 2023-10-22 NOTE — Telephone Encounter (Signed)
 Referral faxed to [facility]. Aileene Lanum Dumire, RN

## 2023-10-23 ENCOUNTER — Other Ambulatory Visit: Payer: Self-pay

## 2023-10-23 ENCOUNTER — Ambulatory Visit: Attending: Family

## 2023-10-23 DIAGNOSIS — E611 Iron deficiency: Secondary | ICD-10-CM | POA: Insufficient documentation

## 2023-10-23 LAB — IRON TRANSFERRIN AND TIBC
IRON (TRANSFERRIN) SATURATION: 22 % (ref 15–50)
IRON: 64 ug/dL (ref 45–170)
TOTAL IRON BINDING CAPACITY: 293 ug/dL (ref 252–504)
TRANSFERRIN: 209 mg/dL (ref 180–360)

## 2023-10-24 ENCOUNTER — Ambulatory Visit (INDEPENDENT_AMBULATORY_CARE_PROVIDER_SITE_OTHER): Payer: Self-pay | Admitting: Family

## 2023-10-28 ENCOUNTER — Other Ambulatory Visit (RURAL_HEALTH_CENTER): Payer: Self-pay | Admitting: NURSE PRACTITIONER

## 2023-10-28 MED ORDER — ALBUTEROL SULFATE HFA 90 MCG/ACTUATION AEROSOL INHALER
2.0000 | INHALATION_SPRAY | RESPIRATORY_TRACT | 1 refills | Status: AC | PRN
Start: 2023-10-28 — End: ?

## 2023-10-29 ENCOUNTER — Ambulatory Visit: Payer: Self-pay | Admitting: Pediatric Allergy/Immunology

## 2023-11-11 ENCOUNTER — Encounter (INDEPENDENT_AMBULATORY_CARE_PROVIDER_SITE_OTHER): Payer: Self-pay | Admitting: Student in an Organized Health Care Education/Training Program

## 2023-11-11 ENCOUNTER — Other Ambulatory Visit: Payer: Self-pay

## 2023-11-11 ENCOUNTER — Ambulatory Visit
Payer: Self-pay | Attending: Student in an Organized Health Care Education/Training Program | Admitting: Student in an Organized Health Care Education/Training Program

## 2023-11-11 VITALS — BP 126/90 | HR 73 | Temp 97.7°F | Ht 60.0 in | Wt 103.0 lb

## 2023-11-11 DIAGNOSIS — K3184 Gastroparesis: Secondary | ICD-10-CM | POA: Insufficient documentation

## 2023-11-11 DIAGNOSIS — R64 Cachexia: Secondary | ICD-10-CM | POA: Insufficient documentation

## 2023-11-11 DIAGNOSIS — R627 Adult failure to thrive: Secondary | ICD-10-CM

## 2023-11-11 NOTE — Progress Notes (Unsigned)
 GASTROENTEROLOGY/HEPATOLOGY, PHYSICIAN OFFICE CENTER  [address]  [place] [address]  Operated by [facility]  Progress Note    Encounter Date: 11/11/2023    Patient ID:  Holly Fritz  FMW:[id number]    DOB: [birth year]  Age: 43 y.o. female    Subjective:     Chief Complaint   Patient presents with    Gastroparesis     HPI    Gastroparesis Baseline Questionnaire (Pre-G-POEM):     Medication History:   - Prior domperidone trial:   - Prior erythromycin trial:   - Prior metoclopramide  trial (Reglan ): Cannot take Reglan  due to psychiatric side effects  - Prior prucalopride (Motegrity) trial:     Interventional History:   - Prior EGD + pyloric balloon dilation: No  - Prior EGD + pyloric botulinum toxin (Botox): No  - Prior percutaneous enteral feeding tube (PEG-J or PEJ or surgical feeding tube): No  - Prior surgical pyloromyotomy:  No  - Gastric pacemaker: No    Etiology:   - Autoimmune (amyloidosis, scleroderma):   - Degenerative neurologic disorder: Budd Chiari malformation  - Diabetes (HgbA1C): No  - Rx: Active usage of narcotics, clonidine, TCA (nortriptyline, amitriptyline), GLP-1. No  - Upper GI surgery (Billroth II gastrectomy, Nissen fundoplication, lung or heart transplant, paraesophageal hernia repair):    Diagnostic workup:   - Gastroparesis disease duration (yrs.): 10-years (2016)  - Prior EGD date / result:   - Prior gastric emptying study (GES) date / result:   - [place] 2025 Gastric Emptying Study: T 1/2 time 137 min, mild delayed emptying     Concurrent chronic constipation status:  - Bowel movement frequency (i.e., #BM / week): daily (chronic diarrhea)    Gastroparesis Cardinal Symptom Index    NAUSEA / VOMITING (sub-score average = 3.67)  Nausea: 5  Retching: 2  Vomiting: 4    POST-PRANDIAL FULLNESS / EARLY SATIETY (sub-score average = 4.75)  4. Stomach fullness: 5  5. Not able to finish a normal-size meal: 5  6. Feeling excessively full after meals: 5  7. Loss of appetite:  4    BLOATING (sub-score average = 4.75)  8. Bloating: 4  9. Stomach or belly visibly larger: 4.5    Average = 4.4 (severe - very severe)    [11/24/15] Nuclear Motility Study  1. Esophageal transit study positive for delayed emptying.   2. Liquid only gastric emptying study is negative for delayed emptying.   3. Combined liquid-solid study is negative for delayed liquid gastric emptying, and negative for delayed solid gastric emptying.   4. Small bowel study is negative for delayed small bowel transit.   5. Large bowel study is positive for diffuse colonic and rectosigmoid delayed transit.     Current Outpatient Medications   Medication Sig    albuterol  sulfate (PROVENTIL  OR VENTOLIN  OR PROAIR ) 90 mcg/actuation Inhalation oral inhaler Take 2 Puffs by inhalation Every 4 hours as needed Indications: bronchospasm prevention    aspirin-acetaminophen -caffeine (EXCEDRIN) 250-250-65 mg Oral Tablet Take 2 Tablets by mouth Every 6 hours as needed for Migraine    capsaicin  (ZOSTRIX) 0.025 % Cream Apply topically Twice daily (Patient not taking: Reported on 11/11/2023)    cetirizine  (ZYRTEC ) 10 mg Oral Tablet Take 1 Tablet (10 mg total) by mouth Twice daily    clobetasoL-gauze-silicone 0.05 %- 4 X 4 Apply externally Kit Apply topically    clonazePAM (KLONOPIN) 0.5 mg Oral Tablet Take 1 Tablet (0.5 mg total) by mouth Twice daily (Patient taking  differently: Take 1 Tablet (0.5 mg total) by mouth Three times a day)    cyclobenzaprine (FLEXERIL) 10 mg Oral Tablet Take 1 Tablet (10 mg total) by mouth Every 8 hours as needed for Muscle spasms    diphenhydrAMINE  (BENADRYL ) 25 mg Oral Capsule Take 1 Capsule (25 mg total) by mouth Once per day as needed for Itching    famotidine  (PEPCID ) 40 mg Oral Tablet Take 1 Tablet (40 mg total) by mouth Twice per day as needed Indications: heartburn    hydrOXYzine  HCL (ATARAX ) 25 mg Oral Tablet Take 2 Tablets (50 mg total) by mouth Three times a day    lamoTRIgine (LAMICTAL) 100 mg Oral Tablet  Take 1 Tablet (100 mg total) by mouth Twice daily    lidocaine  (LIDODERM ) 5 % Adhesive Patch, Medicated APPLY 1 PATCH BY TOPICAL ROUTE ONCE DAILY (MAY WEAR UP TO 12HOURS.)    lidocaine  (XYLOCAINE ) 5 % Ointment APPLY TO AFFECTED AREA 1-4 TIMES DAILY AS NEEDED    multivitamin Oral Liquid Take 5 mL by mouth Daily for 180 days Indications: treatment to prevent vitamin deficiency (Patient not taking: Reported on 11/11/2023)    omeprazole  (PRILOSEC) 40 mg Oral Capsule, Delayed Release(E.C.) Take 1 Capsule (40 mg total) by mouth Daily 30 min before breakfast.  Ok to open capsule and take in applesauce.    ondansetron  (ZOFRAN  ODT) 4 mg Oral Tablet, Rapid Dissolve Take 1 Tab (4 mg total) by mouth Every 8 hours as needed for Nausea/Vomiting    sodium chloride  (SALINE NOSE NASL) by Nasal route    Tacrolimus 0.1 % Ointment Apply topically Twice daily    traZODone  (DESYREL ) 50 mg Oral Tablet Take 1 Tablet (50 mg total) by mouth Every night as needed for Insomnia    TURMERIC ORAL Take by mouth Once a day     Allergies[1]  Past Medical History:   Diagnosis Date    Autoimmune disorder (CMS HCC)     Awareness under anesthesia     Cervicocranial syndrome     Chiari I malformation (CMS HCC)     Chronic pain     Convergence insufficiency     Dysautonomia (CMS HCC)     Ehlers-Danlos syndrome     Endometriosis     Esophageal reflux     Headache     History of anesthesia complications     patient states make sure you give me enough medication to go to sleep and stay asleep or I will wake up during procedure    POTS (postural orthostatic tachycardia syndrome)     Scoliosis     Shortness of breath     Thyroid  nodule          Past Surgical History:   Procedure Laterality Date    ESOPHAGEAL DILATION      HX CHOLECYSTECTOMY      HX COLONOSCOPY      HX LAP CHOLECYSTECTOMY      HX MYOMECTOMY      HX WISDOM TEETH EXTRACTION      POSTERIOR FOSSA DECOMPRESSION      2017         Family Medical History:       Problem Relation (Age of Onset)    COPD  Paternal Grandmother    Congestive Heart Failure Maternal Grandmother    Hypertension (High Blood Pressure) Mother    No Known Problems Father, Sister, Maternal Grandfather, Paternal Grandfather, Daughter, Son, Maternal Aunt, Maternal Uncle, Paternal Aunt, Paternal Uncle, Other  Non-Hodgkin's lymphoma Maternal Grandmother    Primary Brain tumor Mother            Social History[2]    Review of Systems     Objective   Objective:   Vitals: BP (!) 126/90   Pulse 73   Temp 36.5 C (97.7 F) (Thermal Scan)   Ht 1.524 m (5')   Wt 46.7 kg (102 lb 15.3 oz)   SpO2 98%   BMI 20.11 kg/m         Physical Exam     Assessment & Plan:   No diagnosis found.    No orders of the defined types were placed in this encounter.      No follow-ups on file.    Donnice Charlie Needle, MD           [1]   Allergies  Allergen Reactions    Adhesive Rash    Doxycycline Rash     Agitation      Propranolol Hives/ Urticaria, Itching and Rash    Ciprofloxacin     Gabapentin   Other Adverse Reaction (Add comment)     Patient reports is sensitive to medication.    Levaquin [Levofloxacin]     Oxycodone      Patient reports sensitivity to medication.    Penicillins      unknown reaction when an infant - HAS TOLERATED KEFLEX     Tapentadol      Patient reports sensitivity to medication.   [2]   Social History  Tobacco Use    Smoking status: Every Day     Current packs/day: 0.50     Average packs/day: 0.5 packs/day for 25.7 years (12.9 ttl pk-yrs)     Types: Cigarettes     Start date: 2000     Passive exposure: Current (smoker)    Smokeless tobacco: Never    Tobacco comments:     1.5 ppd   Vaping Use    Vaping status: Never Used   Substance Use Topics    Alcohol use: Not Currently    Drug use: Yes     Types: Marijuana     Comment: daily

## 2023-11-12 ENCOUNTER — Ambulatory Visit (HOSPITAL_BASED_OUTPATIENT_CLINIC_OR_DEPARTMENT_OTHER): Payer: Self-pay | Admitting: Pediatric Allergy/Immunology

## 2023-11-12 ENCOUNTER — Encounter (INDEPENDENT_AMBULATORY_CARE_PROVIDER_SITE_OTHER): Payer: Self-pay | Admitting: Student in an Organized Health Care Education/Training Program

## 2023-11-12 ENCOUNTER — Ambulatory Visit (HOSPITAL_BASED_OUTPATIENT_CLINIC_OR_DEPARTMENT_OTHER): Payer: Self-pay | Admitting: Speech-Language Pathologist

## 2023-11-12 DIAGNOSIS — R1312 Dysphagia, oropharyngeal phase: Secondary | ICD-10-CM

## 2023-11-13 ENCOUNTER — Encounter (INDEPENDENT_AMBULATORY_CARE_PROVIDER_SITE_OTHER): Payer: Self-pay

## 2023-11-13 ENCOUNTER — Encounter (HOSPITAL_COMMUNITY): Payer: Self-pay

## 2023-11-14 ENCOUNTER — Encounter (HOSPITAL_COMMUNITY): Payer: Self-pay

## 2023-11-17 ENCOUNTER — Other Ambulatory Visit (HOSPITAL_COMMUNITY): Payer: Self-pay

## 2023-11-17 ENCOUNTER — Encounter (INDEPENDENT_AMBULATORY_CARE_PROVIDER_SITE_OTHER): Payer: Self-pay

## 2023-11-17 ENCOUNTER — Encounter (HOSPITAL_COMMUNITY): Payer: Self-pay

## 2023-11-17 DIAGNOSIS — Z1231 Encounter for screening mammogram for malignant neoplasm of breast: Secondary | ICD-10-CM

## 2023-11-19 ENCOUNTER — Ambulatory Visit: Attending: Gastroenterology | Admitting: Gastroenterology

## 2023-11-19 ENCOUNTER — Ambulatory Visit: Admitting: Gastroenterology

## 2023-11-19 ENCOUNTER — Ambulatory Visit (INDEPENDENT_AMBULATORY_CARE_PROVIDER_SITE_OTHER): Admitting: Gastroenterology

## 2023-11-19 ENCOUNTER — Telehealth (INDEPENDENT_AMBULATORY_CARE_PROVIDER_SITE_OTHER): Payer: Self-pay | Admitting: PHYSICIAN ASSISTANT

## 2023-11-19 ENCOUNTER — Encounter (INDEPENDENT_AMBULATORY_CARE_PROVIDER_SITE_OTHER): Payer: Self-pay | Admitting: Gastroenterology

## 2023-11-19 ENCOUNTER — Encounter (HOSPITAL_COMMUNITY): Payer: Self-pay

## 2023-11-19 ENCOUNTER — Other Ambulatory Visit: Payer: Self-pay

## 2023-11-19 DIAGNOSIS — Z1211 Encounter for screening for malignant neoplasm of colon: Secondary | ICD-10-CM | POA: Insufficient documentation

## 2023-11-19 DIAGNOSIS — Z1212 Encounter for screening for malignant neoplasm of rectum: Secondary | ICD-10-CM | POA: Insufficient documentation

## 2023-11-19 DIAGNOSIS — R1310 Dysphagia, unspecified: Secondary | ICD-10-CM | POA: Insufficient documentation

## 2023-11-19 DIAGNOSIS — K224 Dyskinesia of esophagus: Secondary | ICD-10-CM | POA: Insufficient documentation

## 2023-11-19 MED ORDER — HYOSCYAMINE 0.125 MG SUBLINGUAL TABLET
0.2500 mg | SUBLINGUAL_TABLET | Freq: Three times a day (TID) | SUBLINGUAL | 1 refills | Status: AC | PRN
Start: 2023-11-19 — End: ?

## 2023-11-19 NOTE — Progress Notes (Signed)
 GASTROENTEROLOGY/HEPATOLOGY, PHYSICIAN OFFICE CENTER  [address]  [place] [address]  Operated by [facility]  Telephone Visit    Name: Holly Fritz MRN: [id number]   Date:  11/19/2023 DOB: 1981-02-05          The patient/family initiated a request for telephone service.  Verbal consent for this service was obtained from the patient/family.  Reason for audio only:Patient preference     Reason for call:   Chief Complaint   Patient presents with    Dysphagia      Call notes:    HPI: Holly Fritz is a 43 y.o. female with a documented history of dysphagia, CCY, gastroparesis, Ehlers-Danlos syndrome, Chiari 1 malformation presenting at the request of Dr. Bernetta for evaluation of dysphagia.     Patient saw Dr. Bernetta for a G-POEM evaluation given her history of gastroparesis. Patient is cachectic, and Dr. Bernetta will place a PEJ to hopefully stabilize the patient's total body weight prior to proceeding with a G-POEM. At that office visit, patient expressed issues with esophageal dysphagia contributing to her poor PO intake and FTT. Prior cross-sectional imaging was not suggestive of pseudoachalasia (10/16/22). Fluoro esophagram (12/19/22) results were consistent with mild esophageal dysmotility. EGD with Dr. Durene noted a normal esophagus, stomach, ampulla, duodenal bulb, first and second portion of the duodenum. Esophageal dilation was performed with no resistance at 48, 51, and 54 Fr. Esophageal biopsies obtained. Biopsy results were consistent with reactive changes and negative for EoE. Subsequent esophageal manometry (05/29/23) completed with Dr. Elvis noted a relatively low IRP of the LES and mostly intact peristalsis. Some hypercontracility was seen. Dr. Bernetta reached out to Dr. Durene asking her to review the patient's esophageal manometry results prior to this appointment. Dr. 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     Patient  states that her issues with swallowing began years ago, and it has gotten progressively worse over time. She previously followed at [facility] for these complaints. She has had an EGD with dilation ~ 3 times. She feels like her first dilation worsened her symptoms. Her second dilation seemed to help. These dilations were completed ~ 2018. The third and most recent esophageal dilation (as above) seemed to provide a little bit of relief but not a huge noticeable difference. Patient denies history of a lap band procedure or heller myotomy. Dysphagia can occur with solids, liquids, and pills. She is taking Prilosec 40 mg daily. She states her baseline weight is ~ 115 lbs. She states she has unintentionally lost ~ 10 pounds over the past year. Admits to positive globus sensation, discomfort in her esophagus when the esophageal spasms occur (which is with most meals), and retrosternal discomfort with all meals. She reports vomiting with ~60% of meals. Denies prior history of aspiration, food impactions, melena, or hematochezia.  Patient smokes 1/2 pack per day or less and has done so ~ 20 years. She states that she smokes marijuana daily for diarrhea, nausea, pain. Denies ETOH or additional recreational drug use.     Patient reports having unremarkable colonoscopy around 2018. No records presently available.     Eckardt Score: 10 (dysphagia, regurgitation, and retrosternal pain with each meal, weight loss of < 5 kg over the past year per patient)     Labs:  I have reviewed all pertinent lab values.     Imaging/Previous Endoscopy:  I have reviewed all pertinent imaging studies.     Relevant  studies reviewed below:    CT chest WO (10/16/22):  -There is no axillary, hilar, or mediastinal lymphadenopathy. No pleural or pericardial fluid. No coronary artery atherosclerotic calcification.     Mild scarring of both lung apices is noted. No suspicious nodules, masses, or consolidation are seen.    Fluoro Esophagram  (12/19/22):  -The oropharynx is normal, with no mass or diverticulum. The esophagus shows no mass or stricture. No gastroesophageal reflux is seen. 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     EGD with Dr. Durene (01/17/23):  -Normal esophagus. Dilated. Biopsied.        - Normal stomach.        - Normal ampulla, duodenal bulb, first portion of the duodenum and second portion of the duodenum.     Final Diagnosis   A. ESOPHAGUS, DISTAL, BIOPSY:  - Squamous mucosa with reflux and reactive changes.  B. ESOPHAGUS, PROXIMAL, BIOPSY:  - Squamous mucosa with reactive changes.     Esophageal Manometry with Dr. Elvis (05/29/23):  -relatively low IRP of LES and mostly intact peristalsis. Some hypercontracility seen but it was not significant since < 20% of swallows were hypercontractile  -Dr. Bernetta recently asked Dr. Durene to review esophageal manometry results - Dr. Durene believes the results are consistent with hypercontractile esophagus (jackhammer) with incomplete bolus clearance     Assessment/Plan:  Holly Fritz is a 43 y.o. female with a documented history of dysphagia, CCY, gastroparesis, Ehlers-Danlos syndrome, Chiari 1 malformation presenting at the request of Dr. Bernetta for evaluation of dysphagia.       ICD-10-CM    1. 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 Encounter for colorectal cancer screening  Z12.11     Z12.12            Orders Placed This Encounter    hyoscyamine sulfate (LEVSIN/SL) 0.125 mg Sublingual Tablet, Sublingual     -Increase PPI BID. Advised patient to take this 30 minutes prior to breakfast/dinner on an empty stomach separate from other medications. Advised patient she can open the capsule and mix it into 1 Tbsp of applesauce if needed.  -Start Hyoscyamine 0.25 mg SL tablet  TID PRN.  -Discussed possible treatment options including CCBs, TCAs, dilation, botox, POEM or heller myotomy. Explained that these interventions do not reverse the condition but allow the esophagus to empty, thereby relieving symptoms.  -Explained the advantage of a POEM procedure is the lower invasiveness of the procedure and the potential for fewer complications. In other words the most durable benefit with least intervention.   -Discussed that with the POEM procedure it appears that about 20% of patient will require long term PPI therapy afterwards for reflux disease in the longer term studies. Although a fundoplication in the future after a POEM is also a possibility if reflux is medically refractory.  -Will assess response to Hyoscyamine and will review patient's symptoms and above testing with Dr. Tsosie. Will reach out to patient in the next couple weeks to assess response to Hyoscyamine and discuss next steps if ineffective response.  -Encouraged smoking cessation.  -Patient declines desire to pursue screening colonoscopy at this time until the above is addressed. Continue to follow with primary GI provider for screening colonoscopies.    Total time spent on telephone  call/chart review: 60 minutes.    This is an independent APP visit. Will discuss above findings and plan of care with Dr. Tsosie when he returns. Will update patient with any different recommendations when provided by Dr. Tsosie.    Tinnie Ned, APRN, FNP-C  [facility]  Gastroenterology and Hepatology  Advanced Endoscopy   11/19/23  10:35 AM      Note: A portion of this documentation was generated using voice recognition software and may contain syntax/voice recognition errors.

## 2023-11-19 NOTE — Telephone Encounter (Signed)
 Attempted to call patient for telephone visit today. No answer. Left voicemail with call back number. Will call patient again later today.    Tinnie JAYSON Ned, APRN, CNP

## 2023-11-20 ENCOUNTER — Encounter (HOSPITAL_COMMUNITY)

## 2023-11-24 ENCOUNTER — Ambulatory Visit: Payer: Self-pay | Attending: Family | Admitting: Speech-Language Pathologist

## 2023-11-24 ENCOUNTER — Encounter (HOSPITAL_COMMUNITY): Payer: Self-pay

## 2023-11-24 ENCOUNTER — Inpatient Hospital Stay (HOSPITAL_COMMUNITY): Admission: RE | Admit: 2023-11-24 | Discharge: 2023-11-24 | Disposition: A | Source: Ambulatory Visit

## 2023-11-24 ENCOUNTER — Encounter (HOSPITAL_BASED_OUTPATIENT_CLINIC_OR_DEPARTMENT_OTHER): Payer: Self-pay | Admitting: Speech-Language Pathologist

## 2023-11-24 ENCOUNTER — Encounter (INDEPENDENT_AMBULATORY_CARE_PROVIDER_SITE_OTHER): Payer: Self-pay | Admitting: Family

## 2023-11-24 DIAGNOSIS — R1312 Dysphagia, oropharyngeal phase: Secondary | ICD-10-CM | POA: Insufficient documentation

## 2023-11-24 DIAGNOSIS — R634 Abnormal weight loss: Secondary | ICD-10-CM | POA: Insufficient documentation

## 2023-11-24 DIAGNOSIS — R1310 Dysphagia, unspecified: Secondary | ICD-10-CM | POA: Insufficient documentation

## 2023-11-24 HISTORY — DX: Other general symptoms and signs: R68.89

## 2023-11-24 HISTORY — DX: Presence of spectacles and contact lenses: Z97.3

## 2023-11-24 NOTE — Progress Notes (Signed)
 Speech-Language Pathology Visit    [facility]  [address] [place], [place] [address]  [phone]   (Fax) [phone]    Speech Therapy Treatment Note    Patient Name: Holly Fritz  Date of Birth: 20-Nov-1980  Payor: [employer] [place] / Plan: [facility] MEDICAID / Product Type: Medicaid [place] /      Date/Time: 11/24/2023  1:30pm  Medical Diagnosis:  Dysphagia [R13.10];Weight loss [R63.4]  Treatment Diagnosis:dysphagia      ICD-10-CM     1. Oropharyngeal dysphagia  R13.12         2. Dysphagia  R13.10         3. Weight loss  R63.4               Onset date: this encounter order date of 06/17/2023  Referring Provider:Stockdale, Lauraine, APRN,*     Evaluation date: 09/23/2023  Re-certification due: 12/22/2023  Insurance authorization: 10 visits thru 02/11/2024     Visit#: 2, including evaluation    TELEMEDICINE DOCUMENTATION:    Patient Location: MyChart video visit from home address: [address]  [place] [address]  Patient/family aware of provider location:  yes  Patient/family consent for telemedicine:  yes  Therapy observed and performed by:  Niara Bunker, SLP      Subjective:     Patient presents for outpatient  therapy. Pleasant and cooperative. hello        Objective:   The follow therapy tasks were completed:   SLP requested patient provide brief update on status and any plans; she will be receiving a J-tube placement on 12/03/23 towards increasing nutritional input and status. She has also started on a medication to hopefully reduce esophageal spasms. Patient reported continued difficulty swallowing, keeping food down/vomiting , and continued weight loss.   SLP instructed swallowing exercises based on evaluation results. As patinet was re-demonstrating, she indicated challenge and feeling of muscle weakness limiting her completion of exercises. She reported she was not aware of this; she is very willing to work on these. Exercises included:  [place], anterior tongue push against resistance, high pitched ee, first steps of supraglottic swallow. Patient is to discuss the chin tuck against resistance exercise with her OT, who is working on patient's neck area. SLP also asked patient to discuss/ask OT if she is able to include myofascial release on patient's anterior neck (OT is doing myofascial release on other areas of patient's neck).  Patient endorsed understanding rationale, method of the swallow exercises even if there were some that she could not immediately achieve.     Patient education: as above; also discussed MBSS, patient wishes to wait to follow up with this as she has procedures scheduled.     Assessment:   Excellent reception to today's therapy activities and HEP tasks.   Pain Assessment: not reported  CONFIRMATION OF MEDICAL NECESSITY   Based on current diagnosis, functional performance prior to admission, and current  functional performance, this patient requires continued Speech services   in Outpatient Therapy in order to achieve significant functional improvements in these   deficit areas: Swallowing.           Goals:      Short term goals:   1. Safe and adequate po intake at least restrictive diet level to meet daily nutritive and hydration needs.   2. Strict adherence to aspiration precautions at 95-100% compliance.   3. Strict adherence to acid reflux management guidelines at 95-100% compliance.  4. Complete ENT consult.  5. Patient will earn, use swallowing strengthening exercises with daily compliance.   6. Patient will learn, use esophageal dysphagia technique with daily compliance.      Long term goal:  Adequate and safe PO intake and swallow function to meet daily nutritional and hydration needs.      Plan:   Recommendations:continue at any diet level tolerable; continue to work with dietician; consult with ENT. begin speech/swallow therapy.    Aspiration Precautions: Small bites, Small sips, Swallow each bite/sip before  taking next, Multiple swallows, and Remain upright after meals for  60 minutes after eating  Results & Recommendations Discussed With:Patient   Plan to follow patient 1x a day;  1 x/every other week for 12 weeks; from 09/23/2023 to 12/22/2023  The risks/benefits of therapy have been discussed with the patient/caregiver and he/she is in agreement with the established plan of care.   Therapist:   Ronaldo Crilly, M.S CCC-SLP    Treatment Time: 50 minutes  Start time: 1:40pm  Stop time: 2:30pm

## 2023-11-24 NOTE — Nurses Notes (Signed)
 [facility] MEDICINE    Preoperative Evaluation Center   Department of Anesthesiology      Name: Holly Fritz, Holly Fritz   DOB: November 01, 1980    PRE-PROCEDURE/OPERATIVE INSTRUCTIONS-ADULT GI  Preoperative Evaluation Center [facility])  [address] [place], [place] [address]    Thank you for choosing [facility] for your health care needs. [facility] is a tobacco-free campus. Please refrain from using any forms of tobacco while on the premises.     Please review upon receipt and again prior to procedure date.    If, after your PEC call or visit, you experience any of the changes below or develop any of the listed symptoms, Call the Preoperative Evaluation Center [facility]) at [phone].   Change in your best contact phone number.  Changes in your medications, especially starting a new medication.  Changes in your medical history, including an Emergency room visit, a hospital admission, or you receive a new diagnosis.    Develop any of these symptoms:  fever, cough, sore throat, shortness of breath, chills, muscle pain, new loss of taste or smell, vomiting or diarrhea, or fatigue (extreme tiredness or lack of energy).  Diagnosed with conjunctivitis (pink eye) or shingles.  Diagnosed as COVID positive.  Develop a wound, rash, open area or sores.  If you use or are prescribed an antibiotic.     Also contact your PCP to discuss your symptom(s) and the potential need for treatment/ appointments/etc.     PROCEDURE:    Procedure(s):  PERCUTANEOUS ENDOSCOPIC JEJUNOSTOMY    DATE of SURGERY:   December 03, 2023    ARRIVAL LOCATION:   1st Floor Registration     Arrival time and diet instructions will be given the business day prior to your procedure between 1030 am to 1 pm. If you should not hear from anyone by 2 pm the business day prior to your procedure, please call [phone] (Option 3).    Patients and visitors may park for free in the lots in front of the hospital. If you are need assistance from [facility] parking lot, we have  transportation available to you.  Call security at [phone] to arrange pick up from the parking lot.    DIET INSTRUCTIONS:   STOP regular diet 8 hours before the arrival time of the surgery/procedure.   STOP clear liquids 3 hours before the time of surgery/procedure  then NPO (NPO means NO FOOD or DRINK).    ACCEPTABLE CLEAR LIQUIDS: Water , fruit juices (white grape or apple), pedialyte, gatorade, contrast dye (limited to non-particulate forms, such as gastrografin), coffee & tea without fat/milk/creamer, and clear broth without fat or protein.     LIQUIDS NOT ACCEPTED: Orange juice, any product colored red/blue/purple, coffee or tea with fat/milk/creamer, or any alcoholic beverages.       Your diet instructions are specific for your safety. When not followed, particles left in your stomach could enter your lungs during surgery. Failure to follow the instructions could result in a delay or cancellation of your surgery.             No tobacco (cigarettes, vaping, snuff, or chewing tobacco) or medical marijuana (all types) 8 hours prior to scheduled arrival time.  No Alcohol, or recreational drugs, including marijuana, 24 hours prior to scheduled arrival time.  No Chewing gum is permitted.    MEDICATION INSTRUCTIONS:    Meds to take day of procedure with a sip of water : zyrtec , klonopin, atarax , lamictal, prilosec, proventil , flexeril, pepcid , zofran ,  Meds to stop: HOLD Levsin, Lidoderm  the day of surgery    Avoid taking:?Iron  and fiber supplements for 5 days prior to your procedure.        PREP:   Procedure prep instructions will come from the GI Office.    TRANSPORTATION:  You must arrange for a responsible person, 18 or older, to drive you home and stay for 24 hours following discharge after the procedure. Public transportation may only be used in the event you still have the responsible person with you, drivers of the transportation are not responsible for your care.  If you have not arranged for a driver  and responsible person to accompany you, your procedure will be cancelled.      If you use home oxygen, bring enough for travel to hospital and return trip home.     OTHER IMPORTANT INFORMATION:  Do not bring money, jewelry, or valuables with you.    Do not wear make-up, nail polish, lotion, powder, or aftershave. Do not wear any jewelry, watches, earrings, rings, or body piercings.  Piercings, including silicone, will be required to be removed in Pre-op area prior to surgery.  Remove your continuous glucose monitoring system prior to arrival as staff are required to check your glucose using facility monitors. Feel free to bring your device with you as you can reapply it after surgery, upon discharge.    Bring Programmer for implantable devices (Deep Brain Stimulator or Nerve Stimulator for pain/mental health/sleep apnea, etc.) as they may be required to be turned off.  No visitors under age 75 are permitted in the preparation/recovery areas. It is strongly suggested that child-care arrangements be made.   Wear glasses instead of contact lenses. If possible, bring a case for your glasses.  It may be possible to wear your hearing aid throughout the procedure. Discuss this with the Pre op nursing staff.  Wear comfortable shoes and clean clothes to the hospital as you will wear them home after the procedure.  Bring any equipment - crutches, splints, etc., that you are already using at home.  Bring any legal paperwork with you (guardianship, custody, Medical Power of Attorney, Designer, industrial/product, etc) if applicable.  Before using the bathroom at the hospital, check with hospital staff for needed specimens.    LODGING:    The [facility] provide "homes away from home" for families of patients. If you, the patient, plan to stay, a responsible person 58 or older, is required to stay with you for 24 hours following discharge, the same as if you are going home after discharge from facility. The [facility], located across the parking lot from the hospital provides lodging and supportive services to adult patients and their families. To be eligible, guests must live 50 miles or more from the [place] area and request a referral from a hospital staff person to be placed on the waiting list. The [facility] staff and volunteers can assist you with hotel reservations, usually at a discounted rate, until a room becomes available. [phone].    For more information on local motels and a list of private homes providing rental rooms, contact Social Services at [phone].    SPIRITUAL CARE:  [facility] has chaplains available every day for your spiritual needs. Our Interfaith Prayer and Meditation Room is located at [facility] on the first floor between the [employer] and elevators. You may ask your nurse or staff member to contact the on-call chaplain anytime.  For questions regarding instructions please contact PEC at [phone]. Leave a message if no answer.      CANCEL/PROCEDURE/SURGERY:   If you must cancel your procedure, call [phone] between 8am and 4:30pm.  After 4:30pm, call the [facility] at [phone] and ask for the GI Fellow on call.

## 2023-11-25 ENCOUNTER — Encounter (INDEPENDENT_AMBULATORY_CARE_PROVIDER_SITE_OTHER): Payer: Self-pay | Admitting: PHYSICIAN ASSISTANT

## 2023-11-27 ENCOUNTER — Ambulatory Visit (HOSPITAL_COMMUNITY): Payer: Self-pay

## 2023-12-03 ENCOUNTER — Encounter (HOSPITAL_COMMUNITY): Payer: Self-pay | Admitting: Student in an Organized Health Care Education/Training Program

## 2023-12-03 ENCOUNTER — Inpatient Hospital Stay
Admission: RE | Admit: 2023-12-03 | Discharge: 2023-12-06 | DRG: 392 | Disposition: A | Discharge status: Home / Self Care | Source: Ambulatory Visit | Attending: Student in an Organized Health Care Education/Training Program | Admitting: Student in an Organized Health Care Education/Training Program

## 2023-12-03 ENCOUNTER — Inpatient Hospital Stay (HOSPITAL_COMMUNITY)

## 2023-12-03 ENCOUNTER — Encounter (HOSPITAL_COMMUNITY)
Admission: RE | Disposition: A | Discharge status: Home / Self Care | Payer: Self-pay | Source: Ambulatory Visit | Attending: Student in an Organized Health Care Education/Training Program

## 2023-12-03 ENCOUNTER — Other Ambulatory Visit: Payer: Self-pay

## 2023-12-03 ENCOUNTER — Inpatient Hospital Stay (HOSPITAL_COMMUNITY): Admitting: Certified Registered"

## 2023-12-03 DIAGNOSIS — R627 Adult failure to thrive: Secondary | ICD-10-CM | POA: Diagnosis present

## 2023-12-03 DIAGNOSIS — Z885 Allergy status to narcotic agent status: Secondary | ICD-10-CM

## 2023-12-03 DIAGNOSIS — K3184 Gastroparesis: Principal | ICD-10-CM | POA: Diagnosis present

## 2023-12-03 DIAGNOSIS — Z880 Allergy status to penicillin: Secondary | ICD-10-CM

## 2023-12-03 DIAGNOSIS — Z934 Other artificial openings of gastrointestinal tract status: Secondary | ICD-10-CM

## 2023-12-03 DIAGNOSIS — R634 Abnormal weight loss: Secondary | ICD-10-CM

## 2023-12-03 DIAGNOSIS — Z431 Encounter for attention to gastrostomy: Principal | ICD-10-CM | POA: Diagnosis present

## 2023-12-03 DIAGNOSIS — J302 Other seasonal allergic rhinitis: Secondary | ICD-10-CM | POA: Diagnosis present

## 2023-12-03 DIAGNOSIS — J4489 Other specified chronic obstructive pulmonary disease: Secondary | ICD-10-CM | POA: Diagnosis present

## 2023-12-03 DIAGNOSIS — F17210 Nicotine dependence, cigarettes, uncomplicated: Secondary | ICD-10-CM | POA: Diagnosis present

## 2023-12-03 DIAGNOSIS — Q796 Ehlers-Danlos syndrome, unspecified: Secondary | ICD-10-CM

## 2023-12-03 DIAGNOSIS — Z79899 Other long term (current) drug therapy: Secondary | ICD-10-CM

## 2023-12-03 DIAGNOSIS — Z931 Gastrostomy status: Principal | ICD-10-CM | POA: Diagnosis present

## 2023-12-03 LAB — BASIC METABOLIC PANEL
ANION GAP: 8 mmol/L (ref 4–13)
BUN/CREA RATIO: 12 (ref 6–22)
BUN: 10 mg/dL (ref 8–25)
CALCIUM: 8.5 mg/dL — ABNORMAL LOW (ref 8.6–10.2)
CHLORIDE: 110 mmol/L (ref 96–111)
CO2 TOTAL: 23 mmol/L (ref 22–30)
CREATININE: 0.86 mg/dL (ref 0.60–1.05)
GLUCOSE: 95 mg/dL (ref 65–125)
POTASSIUM: 4 mmol/L (ref 3.7–5.3)
SODIUM: 141 mmol/L (ref 136–145)
eGFRcr - FEMALE: 86 mL/min/1.73mˆ2 (ref >=60)

## 2023-12-03 LAB — HCG, SERUM QUALITATIVE, PREGNANCY: PREGNANCY, SERUM QUALITATIVE: NEGATIVE

## 2023-12-03 LAB — MAGNESIUM: MAGNESIUM: 1.9 mg/dL (ref 1.8–2.6)

## 2023-12-03 SURGERY — PERCUTANEOUS ENDOSCOPIC JEJUNOSTOMY
Anesthesia: General | Wound class: Clean Contaminated Wounds-The respiratory, GI, Genital, or urinary

## 2023-12-03 MED ORDER — SODIUM CHLORIDE 0.9 % INTRAVENOUS SOLUTION
INTRAVENOUS | Status: DC | PRN
Start: 2023-12-03 — End: 2023-12-03
  Administered 2023-12-03: 0 mL via INTRAVENOUS

## 2023-12-03 MED ORDER — FENTANYL (PF) 50 MCG/ML INJECTION WRAPPER
12.5000 ug | INJECTION | INTRAMUSCULAR | Status: DC | PRN
Start: 2023-12-03 — End: 2023-12-04

## 2023-12-03 MED ORDER — LIDOCAINE (PF) 20 MG/ML (2 %) INJECTION SOLUTION
Freq: Once | INTRAMUSCULAR | Status: DC | PRN
Start: 2023-12-03 — End: 2023-12-03
  Administered 2023-12-03: 100 mg via INTRAVENOUS

## 2023-12-03 MED ORDER — IPRATROPIUM 0.5 MG-ALBUTEROL 3 MG (2.5 MG BASE)/3 ML NEBULIZATION SOLN
3.0000 mL | INHALATION_SOLUTION | RESPIRATORY_TRACT | Status: DC | PRN
Start: 2023-12-03 — End: 2023-12-06
  Filled 2023-12-03: qty 3

## 2023-12-03 MED ORDER — SUCCINYLCHOLINE 20 MG/ML INTRAVENOUS WRAPPER
INJECTION | Freq: Once | INTRAVENOUS | Status: DC | PRN
Start: 2023-12-03 — End: 2023-12-03
  Administered 2023-12-03: 200 mg via INTRAVENOUS

## 2023-12-03 MED ORDER — SODIUM CHLORIDE 0.9 % (FLUSH) INJECTION SYRINGE
2.0000 mL | INJECTION | Freq: Three times a day (TID) | INTRAMUSCULAR | Status: DC
Start: 2023-12-03 — End: 2023-12-06
  Administered 2023-12-03: 3 mL
  Administered 2023-12-03 – 2023-12-04 (×3): 0 mL
  Administered 2023-12-04: 2 mL
  Administered 2023-12-05 (×2): 0 mL
  Administered 2023-12-05: 5 mL
  Administered 2023-12-06: 0 mL
  Administered 2023-12-06: 3 mL

## 2023-12-03 MED ORDER — SCOPOLAMINE 1 MG OVER 3 DAYS TRANSDERMAL PATCH
1.0000 | MEDICATED_PATCH | TRANSDERMAL | Status: DC
Start: 2023-12-03 — End: 2023-12-06
  Administered 2023-12-03: 1 via TRANSDERMAL
  Administered 2023-12-06: 0 via TRANSDERMAL
  Filled 2023-12-03 (×3): qty 1

## 2023-12-03 MED ORDER — SODIUM CHLORIDE 0.9 % INTRAVENOUS SOLUTION
50.0000 mL | INTRAVENOUS | Status: AC
Start: 2023-12-03 — End: 2023-12-03
  Administered 2023-12-03: 10 mL

## 2023-12-03 MED ORDER — SODIUM CHLORIDE 0.9 % (FLUSH) INJECTION SYRINGE
2.0000 mL | INJECTION | Freq: Three times a day (TID) | INTRAMUSCULAR | Status: DC
Start: 2023-12-03 — End: 2023-12-06
  Administered 2023-12-03: 6 mL
  Administered 2023-12-03: 3 mL
  Administered 2023-12-04 – 2023-12-06 (×8): 0 mL

## 2023-12-03 MED ORDER — WATER FOR INJECTION, STERILE INJECTION SOLUTION
2.0000 g | Freq: Once | INTRAMUSCULAR | Status: DC
Start: 2023-12-03 — End: 2023-12-03
  Filled 2023-12-03: qty 20

## 2023-12-03 MED ORDER — HYDROMORPHONE (PF) 0.5 MG/0.5 ML INJECTION SYRINGE
0.4000 mg | INJECTION | INTRAMUSCULAR | Status: DC | PRN
Start: 2023-12-03 — End: 2023-12-03
  Administered 2023-12-03 (×2): 0.4 mg via INTRAVENOUS
  Filled 2023-12-03 (×3): qty 0.5

## 2023-12-03 MED ORDER — SODIUM CHLORIDE 0.9% FLUSH BAG - 250 ML
INTRAVENOUS | Status: DC | PRN
Start: 2023-12-03 — End: 2023-12-06

## 2023-12-03 MED ORDER — THIAMINE MONONITRATE (VITAMIN B1) 100 MG TABLET
100.0000 mg | ORAL_TABLET | Freq: Every day | ORAL | Status: DC
Start: 2023-12-03 — End: 2023-12-03

## 2023-12-03 MED ORDER — FENTANYL (PF) 50 MCG/ML INJECTION SOLUTION
INTRAMUSCULAR | Status: AC
Start: 2023-12-03 — End: 2023-12-03
  Filled 2023-12-03: qty 2

## 2023-12-03 MED ORDER — CEFAZOLIN 1 GRAM SOLUTION FOR INJECTION
Freq: Once | INTRAMUSCULAR | Status: DC | PRN
Start: 2023-12-03 — End: 2023-12-03
  Administered 2023-12-03: 2000 mg via INTRAVENOUS

## 2023-12-03 MED ORDER — SODIUM CHLORIDE 0.9 % (FLUSH) INJECTION SYRINGE
2.0000 mL | INJECTION | INTRAMUSCULAR | Status: DC | PRN
Start: 2023-12-03 — End: 2023-12-06

## 2023-12-03 MED ORDER — DEXTROSE 5% IN WATER (D5W) FLUSH BAG - 250 ML
INTRAVENOUS | Status: DC | PRN
Start: 2023-12-03 — End: 2023-12-06

## 2023-12-03 MED ORDER — FENTANYL (PF) 50 MCG/ML INJECTION WRAPPER
INJECTION | Freq: Once | INTRAMUSCULAR | Status: DC | PRN
Start: 2023-12-03 — End: 2023-12-03
  Administered 2023-12-03: 100 ug via INTRAVENOUS
  Administered 2023-12-03 (×2): 50 ug via INTRAVENOUS

## 2023-12-03 MED ORDER — OXYCODONE 20 MG/ML ORAL CONCENTRATE
5.0000 mg | ORAL | Status: DC | PRN
Start: 2023-12-03 — End: 2023-12-04

## 2023-12-03 MED ORDER — ONDANSETRON HCL (PF) 4 MG/2 ML INJECTION SOLUTION
4.0000 mg | Freq: Once | INTRAMUSCULAR | Status: DC | PRN
Start: 2023-12-03 — End: 2023-12-06
  Administered 2023-12-03: 4 mg via INTRAVENOUS
  Filled 2023-12-03: qty 2

## 2023-12-03 MED ORDER — OXYCODONE 20 MG/ML ORAL CONCENTRATE
10.0000 mg | ORAL | Status: DC | PRN
Start: 2023-12-03 — End: 2023-12-04
  Administered 2023-12-03 (×2): 10 mg via GASTROSTOMY
  Administered 2023-12-04: 0 mg via GASTROSTOMY
  Administered 2023-12-04: 10 mg via GASTROSTOMY
  Filled 2023-12-03 (×4): qty 0.5

## 2023-12-03 MED ORDER — HYDROMORPHONE (PF) 0.5 MG/0.5 ML INJECTION SYRINGE
0.2000 mg | INJECTION | INTRAMUSCULAR | Status: DC | PRN
Start: 2023-12-03 — End: 2023-12-03
  Administered 2023-12-03: 0.2 mg via INTRAVENOUS
  Filled 2023-12-03 (×2): qty 0.5

## 2023-12-03 MED ORDER — HYDROMORPHONE (PF) 0.5 MG/0.5 ML INJECTION SYRINGE
0.2000 mg | INJECTION | INTRAMUSCULAR | Status: DC | PRN
Start: 2023-12-03 — End: 2023-12-06
  Administered 2023-12-04 – 2023-12-05 (×5): 0.2 mg via INTRAVENOUS
  Filled 2023-12-03 (×6): qty 0.5

## 2023-12-03 MED ORDER — PROPOFOL 10 MG/ML IV BOLUS
INJECTION | Freq: Once | INTRAVENOUS | Status: DC | PRN
Start: 2023-12-03 — End: 2023-12-03
  Administered 2023-12-03: 150 mg via INTRAVENOUS

## 2023-12-03 MED ORDER — SODIUM CHLORIDE 0.9 % (FLUSH) INJECTION SYRINGE
2.0000 mL | INJECTION | Freq: Three times a day (TID) | INTRAMUSCULAR | Status: DC
Start: 2023-12-03 — End: 2023-12-06
  Administered 2023-12-03: 3 mL
  Administered 2023-12-04 – 2023-12-06 (×8): 0 mL

## 2023-12-03 MED ORDER — OXYCODONE 20 MG/ML ORAL CONCENTRATE
5.0000 mg | ORAL | Status: DC | PRN
Start: 2023-12-03 — End: 2023-12-03

## 2023-12-03 MED ORDER — LACTATED RINGERS INTRAVENOUS SOLUTION
INTRAVENOUS | Status: DC
Start: 2023-12-03 — End: 2023-12-05

## 2023-12-03 MED ORDER — FENTANYL (PF) 50 MCG/ML INJECTION WRAPPER
25.0000 ug | INJECTION | INTRAMUSCULAR | Status: DC | PRN
Start: 2023-12-03 — End: 2023-12-04

## 2023-12-03 MED ORDER — THIAMINE HCL (VITAMIN B1) 100 MG/ML INJECTION SOLUTION
100.0000 mg | INTRAMUSCULAR | Status: DC
Start: 2023-12-03 — End: 2023-12-06
  Administered 2023-12-03 – 2023-12-05 (×3): 100 mg via INTRAVENOUS
  Filled 2023-12-03 (×4): qty 2

## 2023-12-03 MED ORDER — MIDAZOLAM (PF) 1 MG/ML INJECTION SOLUTION
Freq: Once | INTRAMUSCULAR | Status: DC | PRN
Start: 2023-12-03 — End: 2023-12-03
  Administered 2023-12-03: 2 mg via INTRAVENOUS

## 2023-12-03 MED ORDER — OXYCODONE 20 MG/ML ORAL CONCENTRATE
10.0000 mg | ORAL | Status: DC | PRN
Start: 2023-12-03 — End: 2023-12-03

## 2023-12-03 MED ORDER — MIDAZOLAM 1 MG/ML INJECTION SOLUTION
INTRAMUSCULAR | Status: AC
Start: 2023-12-03 — End: 2023-12-03
  Filled 2023-12-03: qty 2

## 2023-12-03 MED ORDER — LACTATED RINGERS INTRAVENOUS SOLUTION
INTRAVENOUS | Status: DC
Start: 2023-12-03 — End: 2023-12-03

## 2023-12-03 MED ORDER — ONDANSETRON HCL (PF) 4 MG/2 ML INJECTION SOLUTION
Freq: Once | INTRAMUSCULAR | Status: DC | PRN
Start: 2023-12-03 — End: 2023-12-03
  Administered 2023-12-03: 4 mg via INTRAVENOUS

## 2023-12-03 SURGICAL SUPPLY — 4 items
KIT ENDOSCOPIC CLEANING COMPLIANCE ENDOKIT W/1.1OZ BOWL BLUE (ENDOSCOPIC SUPPLIES) ×1 IMPLANT
PEG KIT 20FR RADOPQ FEED TUBE EXTERN RETENTION RING PUL MIC SECURLOK SIL 12ML STRL LF (PEG) ×1 IMPLANT
SET SUT SFT-PEXY BIOSYN LOW PROF ANCH PRELD NEEDLE T FASTNR STRL LF (MED SURG SUPPLIES) ×1 IMPLANT
SNARE SM OVAL 240CM 2.4MM SNS LOOP SHRTHRW FLXB ENDOS PLPCTM 13MM STRL LF  DISP (ENDOSCOPIC SUPPLIES) ×1 IMPLANT

## 2023-12-03 NOTE — Anesthesia Preprocedure Evaluation (Signed)
 ANESTHESIA PRE-OP EVALUATION  Planned Procedure: PERCUTANEOUS ENDOSCOPIC JEJUNOSTOMY  Review of Systems    history of awareness of surgery under anesthesia   anesthesia history negative     patient summary reviewed  nursing notes reviewed        Pulmonary   shortness of breath, past history of smoking  and Smoked in last 24 hours,   Cardiovascular  negative cardio ROS,  NYHA Classification:I  ECG reviewed ,No peripheral edema,  Exercise Tolerance: <4 METS        GI/Hepatic/Renal    GERD        Endo/Other   neg endo/other ROS,       Neuro/Psych/MS    headaches, Substance use, marijuana, Neck problems     Cancer    negative hematology/oncology ROS,                     Physical Assessment      Airway       Mallampati: II    TM distance: >3 FB    Neck ROM: full  Mouth Opening: good.  No Facial hair  No Beard  No endotracheal tube present  No Tracheostomy present    Dental           (+) chipped           Pulmonary    Breath sounds clear to auscultation  (-) no rhonchi, no decreased breath sounds, no wheezes, no rales and no stridor     Cardiovascular    Rhythm: regular  Rate: Normal  (-) no friction rub, carotid bruit is not present, no peripheral edema and no murmur     Other findings              Plan  ASA 2     Planned anesthesia type: general     general anesthesia with endotracheal tube intubation        POV PLAN:   plan for postoperative opioid use        Additional Plans: RSI    Intravenous induction     Anesthesia issues/risks discussed are: Dental Injuries, PONV, Post-op Cognitive Dysfunction, Post-op Agitation/Tantrum, Cardiac Events/MI, Aspiration, Sore Throat, Intraoperative Awareness/ Recall and Stroke.  Anesthetic plan and risks discussed with patient       Use of blood products discussed with patient who consented to blood products.      Patient's NPO status is appropriate for Anesthesia.           Plan discussed with CRNA.

## 2023-12-03 NOTE — Anesthesia Postprocedure Evaluation (Signed)
 Anesthesia Post Op Evaluation    Patient: Holly Fritz  Procedure(s) with comments:  PERCUTANEOUS ENDOSCOPIC JEJUNOSTOMY - - Schedule push enteroscopy + PEJ insertion  - Indication: Gastroparesis  - General, supine on fluoro table, hybrid colonoscope, 20-Fr pull PEG, T-fasteners  - Admit post-procedure    Last Vitals:Temperature: 36.6 C (97.9 F) (12/03/23 0842)  Heart Rate: 61 (12/03/23 0842)  BP (Non-Invasive): (!) 137/95 (12/03/23 0842)  Respiratory Rate: 16 (12/03/23 0842)  SpO2: 97 % (12/03/23 0842)    No notable events documented.    Patient is sufficiently recovered from the effects of anesthesia to participate in the evaluation and has returned to their pre-procedure level.  Patient location during evaluation: PACU       Patient participation: complete - patient participated  Level of consciousness: awake and alert and responsive to verbal stimuli    Pain management: adequate  Airway patency: patent    Anesthetic complications: no  Cardiovascular status: acceptable  Respiratory status: acceptable  Hydration status: acceptable  Patient post-procedure temperature: Pt Normothermic   PONV Status: Absent

## 2023-12-03 NOTE — Addendum Note (Signed)
 Addendum  created 12/03/23 1340 by Lonni Suzen BIRCH, APRN,CRNA    Intraprocedure Meds edited

## 2023-12-03 NOTE — Nurses Notes (Signed)
 Patient arrival to 779 from 2W; assessment per flowsheet. Pain medication regimen adjusted per primary. Family at bedside, call bell within reach. No questions at this time.

## 2023-12-03 NOTE — H&P (Addendum)
 [facility]  General Medicine  Admission H&P    Date of Service:  12/03/2023  Holly Fritz, Holly Fritz y.o. 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  GI decided for scheduled percutaneous endoscopic jejunostomy insertion to stabilize body weight before consideration for G-POEM.    Postprocedure patient was seen at PACU.  Patient alert and oriented.  Pain well controlled currently.  She denies any headaches, vision changes, chest pain or pressure, abdominal pain, nausea/vomiting, numbness or weakness, urinary or bowel issues currently.      PAST MEDICAL:    Past Medical History:   Diagnosis Date    Autoimmune disorder (CMS HCC)     Awareness under anesthesia     Cervicocranial syndrome     Chiari I malformation (CMS HCC)     Chronic pain     Convergence insufficiency     Dysautonomia (CMS HCC)     Ehlers-Danlos syndrome     Endometriosis     Esophageal reflux     Headache     History of anesthesia complications     patient states make sure you give me enough medication to go to sleep and stay asleep or I will wake up during procedure    Neck problem     POTS (postural orthostatic tachycardia syndrome)     Scoliosis     Shortness of breath     Thyroid  nodule     Wears glasses         Past Surgical History:   Procedure Laterality Date    ESOPHAGEAL DILATION      HX CHOLECYSTECTOMY      HX COLONOSCOPY      HX LAP CHOLECYSTECTOMY      HX MYOMECTOMY      HX WISDOM TEETH EXTRACTION      POSTERIOR FOSSA  DECOMPRESSION      2017            Medications Prior to Admission       Prescriptions    albuterol  sulfate (PROVENTIL  OR VENTOLIN  OR PROAIR ) 90 mcg/actuation Inhalation oral inhaler    Take 2 Puffs by inhalation Every 4 hours as needed Indications: bronchospasm prevention    aspirin-acetaminophen -caffeine (EXCEDRIN) 250-250-65 mg Oral Tablet    Take 2 Tablets by mouth Every 6 hours as needed for Migraine    Patient not taking:  Reported on 11/24/2023    capsaicin  (ZOSTRIX) 0.025 % Cream    Apply topically Twice daily    Patient not taking:  Reported on 11/11/2023    cetirizine  (ZYRTEC ) 10 mg Oral Tablet    Take 1 Tablet (10 mg total) by mouth Twice daily    clobetasoL-gauze-silicone 0.05 %- 4 X 4 Apply externally Kit    Apply topically    clonazePAM (KLONOPIN) 0.5 mg Oral Tablet    Take 1 Tablet (0.5 mg total) by mouth Twice daily    Patient taking differently:  Take 1 Tablet (0.5 mg total) by mouth Three  times a day    cyclobenzaprine (FLEXERIL) 10 mg Oral Tablet    Take 1 Tablet (10 mg total) by mouth Every 8 hours as needed for Muscle spasms    diphenhydrAMINE  (BENADRYL ) 25 mg Oral Capsule    Take 1 Capsule (25 mg total) by mouth Once per day as needed for Itching    Patient not taking:  Reported on 11/24/2023    famotidine  (PEPCID ) 40 mg Oral Tablet    Take 1 Tablet (40 mg total) by mouth Twice per day as needed Indications: heartburn    hydrOXYzine  HCL (ATARAX ) 25 mg Oral Tablet    Take 2 Tablets (50 mg total) by mouth Three times a day    hyoscyamine sulfate (LEVSIN/SL) 0.125 mg Sublingual Tablet, Sublingual    Place 2 Tablets (0.25 mg total) under the tongue Three times a day as needed    lamoTRIgine (LAMICTAL) 100 mg Oral Tablet    Take 1 Tablet (100 mg total) by mouth Twice daily    lidocaine  (LIDODERM ) 5 % Adhesive Patch, Medicated    APPLY 1 PATCH BY TOPICAL ROUTE ONCE DAILY (MAY WEAR UP TO 12HOURS.)    lidocaine  (XYLOCAINE ) 5 % Ointment    APPLY TO AFFECTED AREA 1-4 TIMES DAILY AS NEEDED    multivitamin  Oral Liquid    Take 5 mL by mouth Daily for 180 days Indications: treatment to prevent vitamin deficiency    Patient not taking:  Reported on 12/03/2023    omeprazole  (PRILOSEC) 40 mg Oral Capsule, Delayed Release(E.C.)    Take 1 Capsule (40 mg total) by mouth Daily 30 min before breakfast.  Ok to open capsule and take in applesauce.    Patient taking differently:  Take 1 Capsule (40 mg total) by mouth Twice daily 30 min before breakfast.  Ok to open capsule and take in applesauce.    ondansetron  (ZOFRAN  ODT) 4 mg Oral Tablet, Rapid Dissolve    Take 1 Tab (4 mg total) by mouth Every 8 hours as needed for Nausea/Vomiting    sodium chloride  (SALINE NOSE NASL)    by Nasal route    Tacrolimus 0.1 % Ointment    Apply topically Twice daily    traZODone  (DESYREL ) 50 mg Oral Tablet    Take 1 Tablet (50 mg total) by mouth Every night as needed for Insomnia    TURMERIC ORAL    Take by mouth Once a day          Allergies[1]    Family History  Family Medical History:       Problem Relation (Age of Onset)    COPD Paternal Grandmother    Congestive Heart Failure Maternal Grandmother    Hypertension (High Blood Pressure) Mother    No Known Problems Father, Sister, Maternal Grandfather, Paternal Grandfather, Daughter, Son, Maternal Aunt, Maternal Uncle, Paternal Aunt, Paternal Uncle, Other    Non-Hodgkin's lymphoma Maternal Grandmother    Primary Brain tumor Mother            Social History  Social History     Tobacco Use    Smoking status: Every Day     Current packs/day: 0.50     Average packs/day: 0.5 packs/day for 25.8 years (12.9 ttl pk-yrs)     Types: Cigarettes     Start date: 2000     Passive exposure: Current (smoker)    Smokeless tobacco: Never    Tobacco comments:     1.5 ppd   Substance Use Topics  Alcohol use: Not Currently        ROS: Other than ROS in the HPI, all other systems were negative.    Examination:  Temperature: 36 C (96.8 F) Heart Rate: 53 BP (Non-Invasive): (!) 144/90   Respiratory Rate: 17 SpO2: 97 %      GENERAL: Pleasant, No acute distress  PULM: CTA B/L, no wheezes, rales, or rhonchi  CV: RRR, no murmur  GI: abd soft, nontender, nondistended, normoactive BS  EXTREMITIES: no peripheral edema  NEURO:Alert and oriented x 3     Labs:    Results for orders placed or performed during the hospital encounter of 12/03/23 (from the past 24 hours)   HCG, SERUM QUALITATIVE, PREGNANCY    Collection Time: 12/03/23  9:05 AM   Result Value Ref Range    PREGNANCY, SERUM QUALITATIVE Negative Negative    Narrative    Sensitivity of the qualitative pregnancy test is 20 mIU hCG/mL.  If pregnancy is strongly considered, perform quantitative hCG or repeat in 48 hours.        Imaging Studies:    Results for orders placed or performed during the hospital encounter of 12/03/23 (from the past 72 hours)   DIAGNOSTIC FLUORO     Status: None    Narrative    *Procedure not read by radiology.    *Please Refer to Procedure Note for result.        DNR Status:  FULL CODE: ATTEMPT RESUSCITATION/CPR    Assessment/Plan:   Active Hospital Problems    Diagnosis    Primary Problem: Encounter for PEG (percutaneous endoscopic gastrostomy) (CMS HCC)    Feeding by G-tube (CMS HCC)     History of Jackhammer esophagus   History of Gastroparesis  Failure to thrive  -patient came in for elective EGD with PEJ placement by GI.  Postprocedure patient was admitted to medicine for tube feed initiation.  -okay from GI to start tube feeds and medications today.  -nutrition consulted.  -pain control:  IV Dilaudid  p.r.n.    Chronic Obstructive Pulmonary Disease/asthma  -not in exacerbation  -duo nebs p.r.n.    Seasonal allergies  -citrus seen or Zyrtec  PRN    Deep vein thrombosis prophylaxis:  Ambulate     [name], MD             [1]   Allergies  Allergen Reactions    Adhesive Rash    Doxycycline Rash     Agitation      Propranolol Hives/ Urticaria, Itching and Rash    Ciprofloxacin     Gabapentin   Other Adverse Reaction (Add comment)     Patient reports is  sensitive to medication.    Levaquin [Levofloxacin]     Oxycodone      Patient reports sensitivity to medication.    Penicillins      unknown reaction when an infant - HAS TOLERATED KEFLEX     Tapentadol      Patient reports sensitivity to medication.

## 2023-12-03 NOTE — Care Plan (Addendum)
 Medical Nutrition Therapy Assessment        SUBJECTIVE : Pt admitted for PEJ placement, plan to discharge today.     Met with pt and sister in PACU after service reached out to RD office at 4:30 to provide consult immediately due to pt leaving after procedure. Pt reports that she refused to leave after procedure as she was told that she would stay in the hospital for ~2-3 days to recover and tolerate feeds. Pt reports UBW is 115 lbs but she has not weighed that in 2 years. Pt was last on nutrition support (TPN) in November 2018, she was on TPN for 2 years. Pt went to a 6 week program with RDs, SLPs, OT, PTs and was then able to eat by mouth for a while, but was fearful of eating due to possibility of choking and the pain/bloating/nausea she would get after certain meals and foods. Pt said multiple life factors caused her condition to worsen. Pt also said she had been trying to get back on TPN for months and providers have told her that they do not do TPN anywhere in [place] . Pt says Ensure makes her sick due to how many times she has had to be on it. Pt says she is not allergic to anything but certain dyes and ingredients make her skin itchy, and she is hoping for a more sensitive formula. Pt does not like the idea of bolus tube feeds due to having to start and stop feeds so many times a day. Reports intake before the hospital was minimal and has been that way for a while, she drinks protein shakes and has soft foods and ice cream when able to tolerate. Pt became emotional during visit due to the cost she will have to pay for nutrition support, she is getting a divorce and will not longer be on her husband's insurance. Answered all questions and concerns, and explained to pt that an RD will be following once pt is in a room to adjust her regimen as needed.     OBJECTIVE:     Current Diet Order/Nutrition Support:  MNT PROTOCOL FOR DIETITIAN  DIET CLEAR LIQUID  ADULT TUBE FEEDING - CONTINUOUS DRIP CONTINUOUS IN  ADDITION TO MEALS; ENSURE PLUS; Jejunostomy; Initial Rate (ml/hr): 15; Increase by: 10 ml Q6H; Goal Rate (ml/hr): 45 with dietary supplement     Height Used for Calculations: 160 cm (5' 2.99)  Weight Used For Calculations: 47.7 kg (105 lb 2.6 oz)  BMI (kg/m2): 18.67     Ideal Body Weight (IBW) (kg): 52.7  % Ideal Body Weight: 90.51               Physical Assessment: TBA    Estimated Needs:  Energy Calorie Requirements: 1,450-1,700 kcal (30-35 kcal/actual BW 47.7 kg) per day   Protein Requirements (gms/day): 57-72 g (1.2-1.5 g/actual BW 47.7 kg) per day   Fluid Requirements: 1450 ml minimum (25 ml/actual BW 47.7 kg) per day     Comments:  43 y.o., white female with 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    Plan/Interventions :   -Plan to initiate TF via PEJ, start TF at 15 ml/hr, advance 10 ml Q6H  Tube Feed Formula : Ensure plus or 1.5 equivalent  Goal Rate: 45 ml/hr x  24 hours  Provides: 1575 cal = 33 cals/kg                72 g protein = 1.5 g/kg                810 ml free water  plus free water  flushes 105 ml Q4H    -Order thiamine to reduce refeeding risk  -Monitor electrolytes Mg, Phos, K and replace/correct prn  -Keep HOB 30-45 degrees during feeds to reduce aspiration risk  -Monitor weight weekly and trend  -Monitor bowel regularity    RD to follow and update recs prn    Nutrition Diagnosis: Altered GI function related to Current medical condition as evidenced by Need for TF    [name], RDLD     Pager [phone]

## 2023-12-03 NOTE — H&P (Signed)
 [facility]  GI Admission History and Physical      Holly Fritz   MRN:  [id number]  Date of Birth:  [birth year]    Date of Procedure:  12/03/2023    Chief Complaint:     HPI: Holly Fritz, Holly Fritz is a 43 y.o. year old female who presents today for EGD w/ PEJ placement.    Antithrombotics:       Past Medical History:   Diagnosis Date    Autoimmune disorder (CMS HCC)     Awareness under anesthesia     Cervicocranial syndrome     Chiari I malformation (CMS HCC)     Chronic pain     Convergence insufficiency     Dysautonomia (CMS HCC)     Ehlers-Danlos syndrome     Endometriosis     Esophageal reflux     Headache     History of anesthesia complications     patient states make sure you give me enough medication to go to sleep and stay asleep or I will wake up during procedure    Neck problem     POTS (postural orthostatic tachycardia syndrome)     Scoliosis     Shortness of breath     Thyroid  nodule     Wears glasses            Allergies[1]    Medications Prior to Admission       Prescriptions    albuterol  sulfate (PROVENTIL  OR VENTOLIN  OR PROAIR ) 90 mcg/actuation Inhalation oral inhaler    Take 2 Puffs by inhalation Every 4 hours as needed Indications: bronchospasm prevention    aspirin-acetaminophen -caffeine (EXCEDRIN) 250-250-65 mg Oral Tablet    Take 2 Tablets by mouth Every 6 hours as needed for Migraine    Patient not taking:  Reported on 11/24/2023    capsaicin  (ZOSTRIX) 0.025 % Cream    Apply topically Twice daily    Patient not taking:  Reported on 11/11/2023    cetirizine  (ZYRTEC ) 10 mg Oral Tablet    Take 1 Tablet (10 mg total) by mouth Twice daily    clobetasoL-gauze-silicone 0.05 %- 4 X 4 Apply externally Kit    Apply topically    clonazePAM (KLONOPIN) 0.5 mg Oral Tablet    Take 1 Tablet (0.5 mg total) by mouth Twice daily    Patient taking differently:  Take 1 Tablet (0.5 mg total) by mouth Three times a day    cyclobenzaprine (FLEXERIL) 10 mg Oral Tablet    Take 1 Tablet (10 mg total) by mouth  Every 8 hours as needed for Muscle spasms    diphenhydrAMINE  (BENADRYL ) 25 mg Oral Capsule    Take 1 Capsule (25 mg total) by mouth Once per day as needed for Itching    Patient not taking:  Reported on 11/24/2023    famotidine  (PEPCID ) 40 mg Oral Tablet    Take 1 Tablet (40 mg total) by mouth Twice per day as needed Indications: heartburn    hydrOXYzine  HCL (ATARAX ) 25 mg Oral Tablet    Take 2 Tablets (50 mg total) by mouth Three times a day    hyoscyamine sulfate (LEVSIN/SL) 0.125 mg Sublingual Tablet, Sublingual    Place 2 Tablets (0.25 mg total) under the tongue Three times a day as needed    lamoTRIgine (LAMICTAL) 100 mg Oral Tablet    Take 1 Tablet (100 mg total) by mouth Twice daily    lidocaine  (LIDODERM ) 5 % Adhesive Patch, Medicated  APPLY 1 PATCH BY TOPICAL ROUTE ONCE DAILY (MAY WEAR UP TO 12HOURS.)    lidocaine  (XYLOCAINE ) 5 % Ointment    APPLY TO AFFECTED AREA 1-4 TIMES DAILY AS NEEDED    multivitamin Oral Liquid    Take 5 mL by mouth Daily for 180 days Indications: treatment to prevent vitamin deficiency    Patient not taking:  Reported on 12/03/2023    omeprazole  (PRILOSEC) 40 mg Oral Capsule, Delayed Release(E.C.)    Take 1 Capsule (40 mg total) by mouth Daily 30 min before breakfast.  Ok to open capsule and take in applesauce.    Patient taking differently:  Take 1 Capsule (40 mg total) by mouth Twice daily 30 min before breakfast.  Ok to open capsule and take in applesauce.    ondansetron  (ZOFRAN  ODT) 4 mg Oral Tablet, Rapid Dissolve    Take 1 Tab (4 mg total) by mouth Every 8 hours as needed for Nausea/Vomiting    sodium chloride  (SALINE NOSE NASL)    by Nasal route    Tacrolimus 0.1 % Ointment    Apply topically Twice daily    traZODone  (DESYREL ) 50 mg Oral Tablet    Take 1 Tablet (50 mg total) by mouth Every night as needed for Insomnia    TURMERIC ORAL    Take by mouth Once a day             Past Surgical History:   Procedure Laterality Date    ESOPHAGEAL DILATION      HX CHOLECYSTECTOMY       HX COLONOSCOPY      HX LAP CHOLECYSTECTOMY      HX MYOMECTOMY      HX WISDOM TEETH EXTRACTION      POSTERIOR FOSSA DECOMPRESSION      2017           Physical Exam:  Filed Vitals:    12/03/23 0842   BP: (!) 137/95   Pulse: 61   Resp: 16   Temp: 36.6 C (97.9 F)   SpO2: 97%     HEENT: Atraumatic, normocephalic, moist mucous membranes  Heart:  Regular rate and rhythm  Lungs:  No respiratory distress, symmetric chest expansion  Abdomen: Soft, nondistended      Plan:  Proceed with enteroscopy w/ PEJ placement.     Orders Placed This Encounter    HCG, SERUM QUALITATIVE, PREGNANCY    scopolamine  1 mg over 3 days transdermal patch         Holly Slomski, MD         [1]   Allergies  Allergen Reactions    Adhesive Rash    Doxycycline Rash     Agitation      Propranolol Hives/ Urticaria, Itching and Rash    Ciprofloxacin     Gabapentin   Other Adverse Reaction (Add comment)     Patient reports is sensitive to medication.    Levaquin [Levofloxacin]     Oxycodone      Patient reports sensitivity to medication.    Penicillins      unknown reaction when an infant - HAS TOLERATED KEFLEX     Tapentadol      Patient reports sensitivity to medication.

## 2023-12-03 NOTE — Consults (Signed)
 PATIENT NAME:  Holly Fritz  CHART NUMBER: [id number]  DATE OF BIRTH: [birth year]  DATE OF SERVICE: 12/03/2023    PEJ tube successfully placed.  OK to use PEJ for medications and TFs after 4 hours (around 1400).     - Admit patient to medicine to help coordinate tube feed supplies.  - No anticoagulation or antiplatelets (non-ASA) for 3-days.  - Start clear liquid diet now and advanced diet as tolerated.   - Free water  flush (~10-mL) gastric + jejunal ports every 8-hours to prevent tube clog.   - Liquid medications OK to adminster through feeding tube.   - Avoid administration of crushed medications through the feeding tube.   - Consult Nutrition to begin continuous jejunal tube feeds.   - Consult Care Management to arrange for home delivery of tube feeds & supplies (eg., infusion pump, syringes).       Kandis Bathe, MD  PGY-5 Gastroenterology & Hepatology Fellow

## 2023-12-03 NOTE — Anesthesia Transfer of Care (Signed)
 ANESTHESIA TRANSFER OF CARE   Holly Fritz is a 43 y.o. ,female, Weight: 47.7 kg (105 lb 0.8 oz)   had Procedure(s) with comments:  PERCUTANEOUS ENDOSCOPIC JEJUNOSTOMY - - Schedule push enteroscopy + PEJ insertion  - Indication: Gastroparesis  - General, supine on fluoro table, hybrid colonoscope, 20-Fr pull PEG, T-fasteners  - Admit post-procedure  performed  12/03/23   Primary Service: Donnice Charlie Franks*    Past Medical History:   Diagnosis Date    Autoimmune disorder (CMS HCC)     Awareness under anesthesia     Cervicocranial syndrome     Chiari I malformation (CMS HCC)     Chronic pain     Convergence insufficiency     Dysautonomia (CMS HCC)     Ehlers-Danlos syndrome     Endometriosis     Esophageal reflux     Headache     History of anesthesia complications     patient states make sure you give me enough medication to go to sleep and stay asleep or I will wake up during procedure    Neck problem     POTS (postural orthostatic tachycardia syndrome)     Scoliosis     Shortness of breath     Thyroid  nodule     Wears glasses       Allergy History as of 12/03/23       PENICILLINS         Noted Status Severity Type Reaction    05/05/18 0730 Abran Hurl, RN 06/27/17 Active       Comments: unknown reaction when an infant - HAS TOLERATED KEFLEX      05/05/18 0730 Abran Hurl, RN 06/27/17 Active       Comments: unknown reaction when an infant     06/27/17 1720 Shona Landsberg, RN 06/27/17 Active                 DOXYCYCLINE         Noted Status Severity Type Reaction    05/05/18 0728 Abran Hurl, RN 06/27/17 Active Medium  Rash    Comments: Agitation       06/27/17 1720 Shona Landsberg, RN 06/27/17 Active                 ADHESIVE         Noted Status Severity Type Reaction    08/26/17 2119 Severa Olives, RN STUDENT 04/26/15 Active Medium  Rash              GABAPENTIN          Noted Status Severity Type Reaction    05/03/18 1602 Dorise Lennert, RN 05/03/18 Active    Other Adverse Reaction (Add comment)    Comments:  Patient reports is sensitive to medication.               OXYCODONE         Noted Status Severity Type Reaction    05/03/18 1604 Dorise Lennert, [place] 05/03/18 Active       Comments: Patient reports sensitivity to medication.               TAPENTADOL         Noted Status Severity Type Reaction    05/03/18 1604 Dorise Lennert, [place] 05/03/18 Active       Comments: Patient reports sensitivity to medication.               LEVOFLOXACIN         Noted Status  Severity Type Reaction    03/02/22 1637 Gaither Klinefelter, RT (R) 03/02/22 Active                 CIPROFLOXACIN         Noted Status Severity Type Reaction    03/02/22 1637 Gaither Klinefelter, RT (R) 03/02/22 Active                 PROPRANOLOL         Noted Status Severity Type Reaction    03/02/22 1644 Nicholaus Bibles, [place] 08/25/21 Active Medium  Hives/ Urticaria, Itching, Rash                  I completed my transfer of care / handoff to the receiving personnel during which we discussed:  Access, Airway, All key/critical aspects of case discussed, Analgesia, Antibiotics, Expectation of post procedure, Fluids/Product, Gave opportunity for questions and acknowledgement of understanding, Labs and PMHx  Report given to: Elvina Maxwell, RN    Post Location: PACU                        Additional Info:To recovery ibn stable condition report to RN                                     Last OR Temp: Temperature: 36 C (96.8 F)      Airway:* No LDAs found *  Blood pressure (!) 157/97, pulse 70, temperature 36 C (96.8 F), resp. rate 16, height 1.6 m (5' 3), weight 47.7 kg (105 lb 0.8 oz), last menstrual period 11/15/2023, SpO2 100%, not currently breastfeeding.

## 2023-12-04 DIAGNOSIS — J302 Other seasonal allergic rhinitis: Secondary | ICD-10-CM

## 2023-12-04 DIAGNOSIS — R627 Adult failure to thrive: Secondary | ICD-10-CM

## 2023-12-04 DIAGNOSIS — Z431 Encounter for attention to gastrostomy: Secondary | ICD-10-CM

## 2023-12-04 DIAGNOSIS — R9431 Abnormal electrocardiogram [ECG] [EKG]: Secondary | ICD-10-CM

## 2023-12-04 DIAGNOSIS — Z934 Other artificial openings of gastrointestinal tract status: Secondary | ICD-10-CM

## 2023-12-04 DIAGNOSIS — R001 Bradycardia, unspecified: Secondary | ICD-10-CM

## 2023-12-04 DIAGNOSIS — K3184 Gastroparesis: Secondary | ICD-10-CM

## 2023-12-04 DIAGNOSIS — J4489 Other specified chronic obstructive pulmonary disease: Secondary | ICD-10-CM

## 2023-12-04 LAB — ECG 12-LEAD
Atrial Rate: 47 {beats}/min
Calculated P Axis: 77 degrees
Calculated R Axis: 82 degrees
Calculated T Axis: 65 degrees
PR Interval: 144 ms
QRS Duration: 82 ms
QT Interval: 440 ms
QTC Calculation: 389 ms
Ventricular rate: 47 {beats}/min

## 2023-12-04 LAB — BASIC METABOLIC PANEL
ANION GAP: 7 mmol/L (ref 4–13)
BUN/CREA RATIO: 11 (ref 6–22)
BUN: 8 mg/dL (ref 8–25)
CALCIUM: 8.4 mg/dL — ABNORMAL LOW (ref 8.6–10.2)
CHLORIDE: 110 mmol/L (ref 96–111)
CO2 TOTAL: 25 mmol/L (ref 22–30)
CREATININE: 0.7 mg/dL (ref 0.60–1.05)
GLUCOSE: 97 mg/dL (ref 65–125)
POTASSIUM: 3.8 mmol/L (ref 3.5–5.1)
SODIUM: 142 mmol/L (ref 136–145)
eGFRcr - FEMALE: >90 mL/min/1.73mˆ2 (ref >=60)

## 2023-12-04 LAB — PHOSPHORUS: PHOSPHORUS: 3.5 mg/dL (ref 2.4–4.7)

## 2023-12-04 LAB — MAGNESIUM: MAGNESIUM: 1.9 mg/dL (ref 1.8–2.6)

## 2023-12-04 MED ORDER — ONDANSETRON HCL (PF) 4 MG/2 ML INJECTION SOLUTION
4.0000 mg | Freq: Three times a day (TID) | INTRAMUSCULAR | Status: AC | PRN
Start: 2023-12-04 — End: ?
  Administered 2023-12-05 – 2023-12-06 (×2): 4 mg via INTRAVENOUS
  Filled 2023-12-04 (×2): qty 2

## 2023-12-04 MED ORDER — OXYCODONE 20 MG/ML ORAL CONCENTRATE
15.0000 mg | ORAL | Status: DC | PRN
Start: 2023-12-04 — End: 2023-12-06
  Administered 2023-12-04 – 2023-12-06 (×11): 15 mg via GASTROSTOMY
  Filled 2023-12-04 (×11): qty 1

## 2023-12-04 MED ORDER — CYCLOBENZAPRINE 10 MG TABLET
10.0000 mg | ORAL_TABLET | Freq: Three times a day (TID) | ORAL | Status: DC | PRN
Start: 2023-12-04 — End: 2023-12-06
  Administered 2023-12-04 – 2023-12-05 (×2): 10 mg via ORAL
  Filled 2023-12-04 (×2): qty 1

## 2023-12-04 MED ORDER — TRAZODONE 50 MG TABLET
50.0000 mg | ORAL_TABLET | Freq: Every evening | ORAL | Status: DC | PRN
Start: 2023-12-04 — End: 2023-12-06

## 2023-12-04 MED ORDER — OXYCODONE 20 MG/ML ORAL CONCENTRATE
10.0000 mg | ORAL | Status: DC | PRN
Start: 2023-12-04 — End: 2023-12-06

## 2023-12-04 MED ORDER — CLONAZEPAM 0.5 MG TABLET
0.5000 mg | ORAL_TABLET | Freq: Two times a day (BID) | ORAL | Status: DC | PRN
Start: 2023-12-04 — End: 2023-12-06
  Administered 2023-12-04 – 2023-12-06 (×4): 0.5 mg via ORAL
  Filled 2023-12-04 (×4): qty 1

## 2023-12-04 MED ORDER — PROCHLORPERAZINE EDISYLATE 10 MG/2 ML (5 MG/ML) INJECTION SOLUTION
10.0000 mg | Freq: Four times a day (QID) | INTRAMUSCULAR | Status: DC | PRN
Start: 2023-12-04 — End: 2023-12-06
  Administered 2023-12-04 – 2023-12-06 (×2): 10 mg via INTRAVENOUS
  Filled 2023-12-04 (×2): qty 2

## 2023-12-04 NOTE — Consults (Signed)
 Gastroenterology and Hepatology  Consult Follow Up Note    Holly Fritz, [place] y.o. 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     Problem List:  Gastroparesis  S/p PEJ to help meet nutritional requirements    Recommendations:  EGD + PEJ placement 12/03/23  Impression:       - Normal esophagus.        - Normal stomach.        - Normal examined duodenum.        - Normal examined jejunum.        - Insertion of 20-Fr pull-type percutaneous endoscopic jejunostomy        (PEJ) into proximal jejunum. Skin marking at 3-cm.   Recommendation:       *Overall impression: Completion of push enteroscopy with insertion of 20        Fr percutaneous jejunostomy (PEJ) tube to help achieve caloric goals.        - Admit patient to medicine to help coordinate tube feed supplies.        - No anticoagulation or antiplatelets (non-ASA) for 3-days.        - Start clear liquid diet now and advanced diet as tolerated.        - Free water  flush (~10-mL) jejunal port every 8-hours to prevent tube        clog.        - Liquid medications OK to adminster through feeding tube.        - Avoid administration of crushed medications through the feeding tube.        - Consult Nutrition to begin continuous jejunal tube feeds.        - Consult Care Management to arrange for home delivery of tube feeds &        supplies (eg., infusion pump, syringes).        Subjective: Patient alert and oriented, endorsing significant pain at site of PEJ, able to rotate PEJ bumper, gauze removed and skin  appears well/intact. Denies nausea, vomiting, diarrhea, constipation. Denies coffee-ground emesis, hematemesis, hematochezia, melena. Feels better today with no acute complaints.     Objective:  Physical Exam  Filed Vitals:    12/03/23 1956 12/03/23 2300 12/04/23 0404 12/04/23 0818   BP: (!) 150/89 (!) 151/85 (!) 146/96 131/79   Pulse: (!) 48 (!) 48 (!) 48 57   Resp: 15 15 16 16    Temp: 36.6 C (97.9 F) 36.8 C (98.2 F) 36.5 C (97.7 F) 36.9 C (98.4 F)   SpO2: 99% 99% 97% 95%     General: In mild distress  Mouth:                Mucous membranes moist  Neck:  Supple, trachea midline, no JVD  Lungs:  Non labored breathing, equal chest rising  Heart:  RRR  Abdomen: Soft, moderately tender to palpation, PEJ in place skin appears clean and intact  Extremities: No cyanosis, no edema  Neuro:  Grossly normal  Heme:  No paleness.  Skin:  Warm and dry  Labs:  I have personally reviewed all of today's pertinent lab values including normal  sodium 142 (12/04/23).    Imaging: I have personally reviewed and interpreted images from fluoroscopy (12/03/23), which shows J-tube in-place.    Henretta Dubin, MD  PGY-4 Gastroenterology and Hepatology Fellow    I saw and examined the patient.  I reviewed the fellow's note.  I agree with the findings and plan of care as documented in the fellow's note.  Any exceptions/additions are edited/noted.    Donnice Charlie Needle, MD  Assistant Professor, Gastroenterology - Advanced Endoscopy  [facility], [facility] Medicine

## 2023-12-04 NOTE — Care Management Notes (Addendum)
 [facility]  Care Management Initial Evaluation    Patient Name: Holly Fritz  Date of Birth: [birth year]  Sex: female  Date/Time of Admission: 12/03/2023  8:31 AM  Room/Bed: 779/A  Payor: [place]LL [place] / Plan: [place] MEDICAID / Product Type: Medicaid [place] /   Primary Care Providers:  Hott, [place] Taylor, APRN, CNP, APRN* (General)    Pharmacy Info:   Preferred Pharmacy       [facility] [address] [place] [address]    [place] [address]    Phone: [phone] Fax: [phone]    Hours: Not open 24 hours          Emergency Contact Info:   Extended Emergency Contact Information  Primary Emergency Contact: Petties,BRIANA  Address: [place] [address] [place]  Home Phone: [phone]  Work Phone: [phone]  Mobile Phone: [phone]  Relation: Sister  Preferred language: English  Interpreter needed? No    History:   Holly Fritz is a 43 y.o., female, admitted for gastroparesis, encounter for PEG, feeding by G-tube    Height/Weight: 160 cm (5' 3) / 47.7 kg (105 lb 0.8 oz)     LOS: 1 day   Admitting Diagnosis: Gastroparesis [K31.84]  Encounter for PEG (percutaneous endoscopic gastrostomy) (CMS HCC) [Z43.1]  Feeding by G-tube (CMS HCC) [Z93.1]    Assessment:      12/04/23 1148   Assessment Details   Assessment Type Admission   Date of Care Management Update 12/04/23   Date of Next DCP Update 12/05/23   Readmission   Is this a readmission? No   Insurance Information/Type   Insurance type Medicaid   Employment/Financial   Patient has Prescription Coverage?  Yes        Name of Insurance Coverage for Medications [employer]   Financial/Environmental Concerns none   Living Environment   Select an age group to open lives with row.  Adult   Lives With sibling(s)   Living Arrangements apartment   Able to Return to Prior Arrangements yes   Home Safety   Home Assessment: No Problems Identified;Stairs in Home   Home Accessibility stairs within home;no  concerns   Care Management Plan   Discharge Planning Status initial meeting   Projected Discharge Date 12/06/23   Discharge plan discussed with: Patient   CM will evaluate for rehabilitation potential yes   Patient choice offered to patient/family Yes   Form for patient choice reviewed/signed and on chart no   Facility or Agency Preferences [facility] and [facility]   Discharge Needs Assessment   Equipment Currently Used at Home shower chair;cane, straight   Equipment Needed After Discharge other (see comments)  (TBD)   Discharge Facility/Level of Care Needs Home with Home Health and Home Infusion   Transportation Available car;family or friend will provide   Referral Information   Admission Type inpatient   Address Verified verified-no changes   Arrived From home or self-care   ADVANCE DIRECTIVES   Does the Patient have an Advance Directive? No, Information Offered and Refused   Patient Requests Assistance in Having Advance Directive Notarized. N/A   LAY CAREGIVER    Appointed Lay Caregiver? Yes   Lay Caregiver Name Chesnee Floren   Lay Caregiver Relationship to patient sibling   Lay Caregiver Contact Number [phone]   Stairs Within Home, Primary   Number of Stairs, Within Home, Primary  other (see comments)  (13)       Pt was admitted for gastroparesis, encounter for PEG, feeding by G-tube. MSW spoke with Dr. Sundas Zahid and she stated pt would need tube feeds at d/c and possibly d/c ready tomorrow. Per chart review, PEG in place, tube feeds started on 10/22, pain control.  PT/OT not ordered.     MSW met with pt at bedside to complete initial CM assessment. Information on facesheet reviewed and confirmed with pt.  Pt lives has been staying with [name] (sister) in an apartment at [address], [place], [place]. There are 0 steps to enter the home and 13 steps within the home.  Pt's sister can assist pt at home if needed. Pt states all utilities are in working order. Pt has prescription coverage through [name]  and uses [employer] in [place], [place]. Pt manages their home medication by using a pill box. Pt's PCP is [place] Taylor Hott and last seen PCP was probably over a year ago but he has been following up with her. Pt is not active with HH and did use DME prior to admission. Pt does not have dialysis. Pt does not have a MPOA - MSW offered pt MPOA paperwork which pt declined. Pt appointed a lay caregiver which was pt's sister, [name] - lay caregiver document was signed by pt, witnessed by MSW and placed in pt's chart for scanning. Pt reports that Kaisey Fritz will provide transportation when d/c ready. MSW will continue to follow for discharge planning.     Pt stated the address on facesheet is her grandmother's address who passed away a few months ago but she still receives mail there. Pt expressed she is not sure what to do about tube feeds at home. Pt explained she spoke with dietician yesterday about enusre and pt stated she is not able to pay OOP for ensure. Pt explained she is not working due to having a lot of medical issues and is basically homeless but staying with sister. MSW explained that MSW can send HI referral to [facility] to see if they can run her isnurance and see what tube feeds would cost her or to see if insurance would cover it. MSW also offered [facility] services for nursing which pt stated she would like.     MSW sent task to [facility] Assistant to verify pt's insurance benefits for HH/HI and preferred providers serving pt's zipcode.    Provided patient CarePort listing with CMS ratings for Home Health serving the [phone] zip code. The list provided is available in CarePort. Pt would like to try to use [facility]. MSW also reviewed HH and HI benefits per benefit check with pt which were: Health Benefit Plan Coverage Co-Payment $8.00 11/12/2023 - 02/11/2024 QUARTERLY COPAYMENT MAXIMUM .......... HH benefits ........ 0.00$ copay 0% coinsurance ....... HI benefits ....... 0.00$ copay 0% coinsurance. Pt verbalized  understanding.    MSW sent HI referral to [facility] and HH referral to [facility] via careport.    Addendum:    Per careport, [facility] responded with d/c plans doe pt have a CG to assist with feeds.     MSW met with pt and Molly with [facility] at bedside. Geofm stated that insurance will cover pt's feeding pump and supplies but not ensure plus. Pt explained that she is not sure how she is going to afford tube feeds. She stated she has no money. Geofm explained she could use food stamps to cover and pt stated she  did not qualify for food stamps. Geofm explained she could use the equate brand as well. MSW also explained that [facility] HH asked if pt has a CG to assist with feeds which pt reported her sister can assist as needed.    MSW updated CM Supervisor Guilliams-Costabile regarding pt not being able to afford tube feeds OOP to see if there is anything CM can assist with.     MSW made Dr. [name], MD aware of tube feed financial situation.     MSW updated [facility] via careport and secure chat.    Per [name], [facility] pt is on the most affordable feeds and pt made her aware of her financial situation so they started her on ensure plus. Jabs, [employer] has some coupons for ensure they could provide.     MSW spoke with Renea with [employer] [phone] and she explained that the assistance program would need copy of insurance card, proof of income, and application filled out and faxed to tehm. Depending on the amount of applications they get in a day, it takes up to one week to process application. She explained that it may go to a benefit verification review next and if that happens, it goes to another dept and she does not have time frame of that process. She explained if pt is approved for their assistance program the order can take 1-3 weeks for delivery. She explained that once pt is approved, she is approved for 12 month period. Pt would received 12 cases for a year. 1 case has 24 8oz cartons. She stated tey  do 2 shipments so each shipment she gets 6 cases. She stated the criteria is also pt is no longer to eat and calorie intake. She also stated it's dependent on poverty levely by state plus 300%.     MSW spoke with staff at pt's PCP office [phone] and she stated she called [place] Hott and he stated he was not aware of tube feeds so GI would have to follow tube feed orders. The last time he saw pt was in January.     MSW updated [name], MD with Hospitalist 3. She stated Donnice Needle placed the tube and should follow.     MSW sent secure chat to Donnice Needle, MD to see if he would be willing to follow tube feed orders when pt goes home - awaiting response.    Pt may be able to apply for food stamps as well.     Per Karna Pringle with [facility] for nursing only is 10/26. MSW updated pt at bedside. MSW asked if she needed PT/OT. Pt stated she had outpt therapy but recently stopped it. She asked if she can just do nursing now and after [facility] assesses her could she had on therapy. MSW asked Karna Pringle with [facility] via secure chat.    MSW also spopke with pt at bedside regarding information about [employer] Patient Assistance Program. MSW assisted pt on completing application. Service made aware that they need to complete their section of form.     MSW spoke with [name] Lenis, APRN in sent secure chat with Donnice Needle COME regarding HI orders for tube feeds. MSW explained that provider would need to be available for bioscript to sign off any additional orders that they may need sign. The dietician at [facility] manage the tube feeds. MSW explained they just need to be availble to sign off on orders. [name] Moats said she would  be willing to do that and her phone number is [phone] and fax number is [phone].     MSW updated Geofm with [facility] [phone] with [name] Lenis, APRN information and Geofm also confirmed teach went well with pt.     Discharge Plan:  Home with Home Health and Home  Infusion    The patient will continue to be evaluated for developing discharge needs.     Case Manager: Rosina Marina, SOCIAL WORKER  Phone: [phone]

## 2023-12-04 NOTE — Consults (Signed)
 Name: Holly Fritz  DOB: 1980/07/26  MRN: [id number]      Date: 12/04/2023  Time In: 1:47 pm  Time Out: 3:30 pm  Code: [id number]    Chief Complaint: First individual session. Holly Fritz endorsed symptoms of depression and anxiety due to her health concerns.    Subjective: Holly Fritz discussed her anxiety and depressive symptoms, life stressors and trauma.  Holly Fritz expressed I'm dealing with a lot of life stressors along with my health concerns.  We explored her symptoms, life stressors, health concerns and trauma. We spoke about better ways she can manage her symptoms with using medication, coping skills and relaxation techniques.  She feels the medications are helping her symptoms and doesn't want a psychiatry consults about her medications.  We discussed why its important for her to develop and implement positive coping skills because she tends to use a negative coping skill such as cutting herself.  She reported The last time, I cut myself was 2 months ago. We discussed her negative coping skill and how to replace her negative coping skills with positive coping skills. We discussed her life stressors and better ways to manage her life stressors.  Holly Fritz talked about traumatic events she experienced. We spoke about her trauma and better ways to cope with her trauma.  Therapist also asked her several safety questions to make sure she is feeling safe due to her negative thoughts.  Holly Fritz reported I don't want to hurt myself.   Therapist talked to her about having a case manager see her tomorrow to complete a Taft Daring so she has a safety plan when she is discharge from the hospital.  She was agreeable to meet with the case manager tomorrow and therapist notified the case manager Holly Fritz).  We talked about coping skills, relaxation techniques and mindfulness activities she can use to manage her symptoms, life stressors and trauma.    Objective: Holly Fritz appeared depressed but was  cooperative and talkative with therapist.    Assessment: 43.1 PTSD    Procedures: Worked to establish rapport during first session, reviewed history of presenting problem, developed plan for follow-up, and presented basic ideas for coping skills through relaxation.    Plan: Therapist will meet with Holly Fritz next week to work on decreasing her anxiety and depressive symptoms and to discuss her life stressors.      Holly Fritz, Research scientist (physical sciences),  12/04/2023, 15:39    General Psychiatry, pager [phone]

## 2023-12-04 NOTE — Progress Notes (Signed)
 [facility]  Medicine Progress Note    Holly Fritz  Date of service: 12/04/2023  Date of Admission:  12/03/2023    Hospital Day:  LOS: 1 day     Subjective:  Patient mentioned about crampy pain at PEG site.  So far she is tolerating feeds.  No other issues mentioned.    Vital Signs:  Temp  Avg: 36.4 C (97.6 F)  Min: 36 C (96.8 F)  Max: 36.9 C (98.4 F)    Pulse  Avg: 55.5  Min: 48  Max: 72 BP  Min: 125/87  Max: 166/90   Resp  Avg: 14.5  Min: 12  Max: 17 SpO2  Avg: 97.8 %  Min: 94 %  Max: 100 %          Input/Output    Intake/Output Summary (Last 24 hours) at 12/04/2023 1055  Last data filed at 12/04/2023 0615  Gross per 24 hour   Intake 1685 ml   Output 600 ml   Net 1085 ml    I/O last shift:  No intake/output data recorded.   D5W 250 mL flush bag, , Intravenous, Q15 Min PRN  D5W 250 mL flush bag, , Intravenous, Q15 Min PRN  D5W 250 mL flush bag, , Intravenous, Q15 Min PRN  HYDROmorphone  (DILAUDID ) 0.5 mg/0.5 mL injection, 0.2 mg, Intravenous, Q4H PRN  ipratropium-albuterol  0.5 mg-3 mg(2.5 mg base)/3 mL Solution for Nebulization, 3 mL, Nebulization, Q4H PRN  NS 250 mL flush bag, , Intravenous, Q15 Min PRN  NS 250 mL flush bag, , Intravenous, Q15 Min PRN  NS 250 mL flush bag, , Intravenous, Q15 Min PRN  NS flush syringe, 2-6 mL, Intracatheter, Q8HRS  NS flush syringe, 2-6 mL, Intracatheter, Q1 MIN PRN  NS flush syringe, 2-6 mL, Intracatheter, Q8HRS  NS flush syringe, 2-6 mL, Intracatheter, Q1 MIN PRN  NS flush syringe, 2-6 mL, Intracatheter, Q8HRS  NS flush syringe, 2-6 mL, Intracatheter, Q1 MIN PRN  ondansetron  (ZOFRAN ) 2 mg/mL injection, 4 mg, Intravenous, Q8H PRN  oxyCODONE concentrate (ROXICODONE INTENSOL) 20mg  per mL oral liquid, 10 mg, Gastric (NG, OG, PEG, GT), Q4H PRN   And  oxyCODONE concentrate (ROXICODONE INTENSOL) 20mg  per mL oral liquid, 15 mg, Gastric (NG, OG, PEG, GT), Q4H PRN  prochlorperazine  (COMPAZINE ) 5 mg/mL injection, 10 mg, Intravenous, Q6H PRN  scopolamine  1 mg over 3 days  transdermal patch, 1 Patch, Transdermal, Q72H  thiamine (VITAMIN B1) 100 mg/mL injection, 100 mg, Intravenous, Q24H        Physical Exam:  GENERAL: Pleasant, No acute distress  PULM: CTA B/L, no wheezes, rales, or rhonchi  CV: RRR, no murmur  GI: abd soft, nontender, nondistended, normoactive BS.  PEG site looks clean  EXTREMITIES: no peripheral edema, pulses intact  NEURO: Alert and oriented x 3     Labs:  Results for orders placed or performed during the hospital encounter of 12/03/23 (from the past 24 hours)   BASIC METABOLIC PANEL    Collection Time: 12/04/23  4:08 AM   Result Value Ref Range    SODIUM 142 136 - 145 mmol/L    POTASSIUM 3.8 3.5 - 5.1 mmol/L    CHLORIDE 110 96 - 111 mmol/L    CO2 TOTAL 25 22 - 30 mmol/L    ANION GAP 7 4 - 13 mmol/L    CALCIUM 8.4 (L) 8.6 - 10.2 mg/dL    GLUCOSE 97 65 - 874 mg/dL    BUN 8 8 - 25 mg/dL  CREATININE 0.70 0.60 - 1.05 mg/dL    eGFRcr - FEMALE >09 >=60 mL/min/1.73m^2    BUN/CREA RATIO 11 6 - 22   PHOSPHORUS    Collection Time: 12/04/23  4:08 AM   Result Value Ref Range    PHOSPHORUS 3.5 2.4 - 4.7 mg/dL   MAGNESIUM    Collection Time: 12/04/23  4:08 AM   Result Value Ref Range    MAGNESIUM 1.9 1.8 - 2.6 mg/dL      Radiology:    Results for orders placed or performed during the hospital encounter of 12/03/23 (from the past 72 hours)   DIAGNOSTIC FLUORO     Status: None    Narrative    *Procedure not read by radiology.    *Please Refer to Procedure Note for result.        PT/OT: No    Consults: GI    Assessment/ Plan:   Active Hospital Problems    Diagnosis    Primary Problem: Encounter for PEG (percutaneous endoscopic gastrostomy) (CMS HCC)    Feeding by G-tube (CMS HCC)     History of Jackhammer esophagus   History of Gastroparesis  Failure to thrive  -patient came in for elective EGD with PEJ placement by GI.  Postprocedure patient was admitted to medicine for tube feed initiation.  -nutrition consulted.  Tube feeds started on 10/22.  Goal rate 45 mL/hour for 24  hours.  -pain control:  Oxycodone and IV Dilaudid  p.r.n.     Chronic Obstructive Pulmonary Disease/asthma  -not in exacerbation  -duo nebs p.r.n.     Seasonal allergies  -citrus seen or Zyrtec  PRN     Deep vein thrombosis prophylaxis:  Ambulate     [name], MD

## 2023-12-04 NOTE — Consults (Signed)
 SAFE-T Protocol with C-SSRS ([place] Risk and Protective Factors) - Recent    Step 1: Identify Risk Factors                                                   C-SSRS Suicidal Ideation Severity Month   Wish to be dead  Have you wished you were dead or wished you could go to sleep and not wake up? yes   Current suicidal thoughts  Have you actually had any thoughts of killing yourself? yes     Suicidal thoughts w/ Method (w/no specific Plan or Intent or act)  Have you been thinking about how you might do this? no   Suicidal Intent without Specific Plan  Have you had these thoughts and had some intention of acting on them? no   Intent with Plan  Have you started to work out or worked out the details of how to kill yourself? Do you intend to carry out this plan? no   C-SSRS Suicidal Behavior: Have you ever done anything, started to do anything, or prepared to do anything to end your life?"    Examples: Collected pills, obtained a gun, gave away valuables, wrote a will or suicide note, took out pills but didn't swallow any, held a gun but changed your mind or it was grabbed from your hand, went to the roof but didn't jump; or actually took pills, tried to shoot yourself, cut yourself, tried to hang yourself, etc.  If "YES" Was it within the past 3 months? Lifetime    yes    Past 3 Months    no   Activating Events:     Recent losses or other significant negative event(s) (legal, financial, relationship, etc.), Current or pending isolation or feeling alone , Loss of physical health    Treatment History:     No PPH in chart, but pt reports being admitted to a mental health facility for depression and self-harm    Clinical Status:  No PPH in chart at this time-pt reports being admitted to a mental health facility in 2023 for self-harm and depression due to loss of physical health and other significant negative events      Access to lethal methods: Ask specifically about presence or absence of a firearm in the home or ease  of accessing:    no     Step 2: Identify Protective Factors (Protective factors may not counteract significant acute suicide risk factors)   Internal:     Fear of death or dying due to pain and suffering External:     Pt did not identify any external factors during the interview      Step 3: Specific questioning about Thoughts, Plans, and Suicidal Intent - (see Step 1 for Ideation Severity and Behavior)   C-SSRS Suicidal Ideation Intensity (with respect to the most severe ideation 1-5 identified above) Month   Frequency  How many times have you had these thoughts?  4: daily or almost daily   Duration  When you have the thoughts how long do they last? 3: 1-4 hours/a lot of time    Controllability  Could/can you stop thinking about killing yourself or wanting to die if you want to? 3: can control thoughts with some difficulty    Deterrents  Are there things - anyone or anything (e.g.,  family, religion, pain of death) - that stopped you from wanting to die or acting on thoughts of suicide? 2: deterrents probably stopped you   Reasons for Ideation  What sort of reasons did you have for thinking about wanting to die or killing yourself?  Was it to end the pain or stop the way you were feeling (in other words you couldn't go on living with this pain or how you were feeling) or was it to get attention, revenge or a reaction from others? Or both? 4: mostly to end or stop the pain (you couldn't go on living with the pain or how you were feeling)   Total Score                  16         Step 4: Guidelines to Determine Level of Risk and Develop Interventions to LOWER Risk Level  "The estimation of suicide risk, at the culmination of the suicide assessment, is the quintessential clinical judgment, since no study has identified one specific risk factor or set of risk factors as specifically predictive of suicide or other suicidal behavior."   From The American Psychiatric Association Practice Guidelines for the Assessment and  Treatment of Patients with Suicidal Behaviors, page 24.   RISK STRATIFICATION TRIAGE   High Suicide Risk  Suicidal ideation with intent or intent with plan in past month (C-SSRS Suicidal Ideation #4 or #5)  Or  Suicidal behavior within past 3 months (C-SSRS Suicidal Behavior)   Initiate local psychiatric admission process  Stay with patient until transfer to higher level of care is complete  Follow-up and document outcome of emergency psychiatric evaluation     Moderate Suicide Risk  Suicidal ideation with method, WITHOUT plan, intent or behavior       in past month (C-SSRS Suicidal Ideation #3)  Or  Suicidal behavior more than 3 months ago (C-SSRS Suicidal Behavior Lifetime)  Or  Multiple risk factors and few protective factors Directly address suicide risk, implementing suicide     prevention strategies  Develop Safety Plan     Low Suicide Risk  Wish to die or Suicidal Ideation WITHOUT method, intent, plan or behavior (C-SSRS Suicidal Ideation #1 or #2)   Or  Modifiable risk factors and strong protective factors  Or  No reported history of Suicidal Ideation or Behavior  Discretionary Outpatient Referral     Step 5: Documentation   Risk Level :  Moderate Suicide Risk:  develop safety plan     Your Clinical Observation - Case Manager met with Delcie Rockett  to complete the SAFE-T Protocol with C-SSRS due to their ASQ screening. Deandra Prohaska reported passive SI. Tersea Heemstra does not have outpatient psychiatric follow up.  Relevant Mental Status Information - Treva Toman appeared alert, cooperative and talkative with Case Manager.  Methods of Suicide Risk Evaluation - Case Manager observed and interviewed patient with the SAFE-T Protocol with C-SSRS.  They scored a 16 on the scale indicating a moderate suicide risk.  Case Manager also reviewed their chart and ASQ assessment.  Spoke with RN Lauraine and they have no concerns other than the patient having a flat tone and reports seeing the pt for the  first time today. Case Manager informed them that patient is a moderate risk, and need their precautions need to be Same.  HOSPITALIST 3  was paged about patient being a moderate suicide risk.  Ileana Rainbow, CASE MANAGER from psych consult team.  779/A  is a moderate suicide risk and precautions need to be same.  Please call [phone] if you have questions. Thanks!  Brief Evaluation Summary  SAFE-T (Suicide Risk Assessment/Intervention)  Risk Factors:  Anxiety (worry, apprehension, panic), Chronic Pain, Depression, sadness, Employment problem, Hopeless, Intolerable emotional pain, Loss of physical health, Overwhelmed, Self-harm (cutting, burning, etc), and Unmarried  Protective Factors:  : positive therapeutic relationships, expressive of needs/emotions, treatment compliance  Suicide Inquiry:    Thoughts: ideations--no plan  Plans/Behaviors/Past attempts: Pt reports a hx of self-harm, no active plan/intent at this time   Intent:None  Based on assessment, the patient is a moderate risk for suicide.  Collateral Sources Used and Relevant Information Obtained - Chart reviewed, observation of behaviors and motivational interview when assessing Malaiya Gaida with the SAFE-T Protocol with C-SSRS.  Specific Assessment Data to Support Risk Determination - Gabby Girdner scored a 16  on the scale which means they are a moderate suicide risk.  Case Manager also reviewed their chart and ASQ assessment.   Rationale for Actions Taken and Not Taken - Halen Sand  is a moderate suicide risk, they do not have any thoughts, plans or intent to hurt themselves.  Their score of 16  on this assessment does match the way they feels at this time. Evalee Eldridge  is a moderate Risk so they do not need CV or CVT monitoring. Chairty Ferrick agreed to let staff know if they start to feel unsafe in any way.     Therapy will meet with the patient today and assess the need for a high precaution. CM will conduct a Taft Daring following an interview from therapy.     Roberts David- ext: [phone]  Charlene Pinal- ext: [phone]   [facility]    Ileana Rainbow, CASE MANAGER, B.A., 12/04/2023, 08:38

## 2023-12-05 ENCOUNTER — Encounter (INDEPENDENT_AMBULATORY_CARE_PROVIDER_SITE_OTHER): Payer: Self-pay | Admitting: Family

## 2023-12-05 ENCOUNTER — Non-Acute Institutional Stay

## 2023-12-05 ENCOUNTER — Other Ambulatory Visit: Payer: Self-pay

## 2023-12-05 DIAGNOSIS — Z931 Gastrostomy status: Secondary | ICD-10-CM

## 2023-12-05 LAB — BASIC METABOLIC PANEL
ANION GAP: 6 mmol/L (ref 4–13)
BUN/CREA RATIO: 10 (ref 6–22)
BUN: 7 mg/dL — ABNORMAL LOW (ref 8–25)
CALCIUM: 9 mg/dL (ref 8.6–10.2)
CHLORIDE: 107 mmol/L (ref 96–111)
CO2 TOTAL: 25 mmol/L (ref 22–30)
CREATININE: 0.7 mg/dL (ref 0.60–1.05)
GLUCOSE: 101 mg/dL (ref 65–125)
POTASSIUM: 4 mmol/L (ref 3.5–5.1)
SODIUM: 138 mmol/L (ref 136–145)
eGFRcr - FEMALE: >90 mL/min/1.73mˆ2 (ref >=60)

## 2023-12-05 LAB — MAGNESIUM: MAGNESIUM: 1.9 mg/dL (ref 1.8–2.6)

## 2023-12-05 LAB — PHOSPHORUS: PHOSPHORUS: 4.1 mg/dL (ref 2.4–4.7)

## 2023-12-05 MED ORDER — ACETAMINOPHEN 325 MG TABLET
650.0000 mg | ORAL_TABLET | ORAL | Status: DC | PRN
Start: 2023-12-05 — End: 2023-12-06
  Administered 2023-12-05: 650 mg via ORAL
  Filled 2023-12-05: qty 2

## 2023-12-05 MED ORDER — LAMOTRIGINE 100 MG TABLET
100.0000 mg | ORAL_TABLET | Freq: Two times a day (BID) | ORAL | Status: DC
Start: 2023-12-05 — End: 2023-12-06
  Administered 2023-12-05 – 2023-12-06 (×2): 100 mg via GASTROSTOMY
  Filled 2023-12-05 (×2): qty 1

## 2023-12-05 MED ORDER — OXYCODONE 5 MG TABLET
5.0000 mg | ORAL_TABLET | Freq: Four times a day (QID) | ORAL | 0 refills | Status: DC | PRN
Start: 2023-12-05 — End: 2023-12-08
  Filled 2023-12-05: qty 6, 2d supply, fill #0

## 2023-12-05 MED ORDER — NICOTINE 21 MG/24 HR DAILY TRANSDERMAL PATCH
21.0000 mg | MEDICATED_PATCH | Freq: Every day | TRANSDERMAL | Status: DC
Start: 2023-12-05 — End: 2023-12-06
  Administered 2023-12-05: 21 mg via TRANSDERMAL
  Administered 2023-12-06: 0 mg via TRANSDERMAL
  Filled 2023-12-05 (×2): qty 1

## 2023-12-05 MED ORDER — LAMOTRIGINE 100 MG TABLET
100.0000 mg | ORAL_TABLET | Freq: Two times a day (BID) | ORAL | Status: DC
Start: 2023-12-05 — End: 2023-12-05
  Administered 2023-12-05: 100 mg via ORAL
  Filled 2023-12-05: qty 1

## 2023-12-05 NOTE — Discharge Instructions (Addendum)
 Discharge Recommendations/ Plan:Discharge un:Ynfz with Home Health and Home Infusion  Resources: Please call report to nurse at [facility] at [phone]. Start of Care is 10/26. [facility] phone number [phone].        Discharge Instructions Contact Information   If you have any questions regarding your discharge instructions, please contact us :   ?? Hospitalist Transitions Nurse Line     [phone]   Available Monday through Friday, 7:30 AM - 4:30 PM   If you need assistance after hours, on weekends, or on a holiday, please call:   ?? [employer]     [phone] ([phone])   Ask to speak with the Doctor on Call for the Hospitalist Team   If you are experiencing a medical emergency, please call 911 immediately.

## 2023-12-05 NOTE — Progress Notes (Signed)
 [facility]  Medicine Progress Note    Holly Fritz  Date of service: 12/05/2023  Date of Admission:  12/03/2023    Hospital Day:  LOS: 2 days     Subjective:  Patient assessed at bedside.  Tolerating tube feeds well.  Has pain at PEG site but it is better than yesterday.  Agreeable for discharge today.    Vital Signs:  Temp  Avg: 36.8 C (98.2 F)  Min: 36.6 C (97.9 F)  Max: 36.9 C (98.4 F)    Pulse  Avg: 59.5  Min: 53  Max: 75 BP  Min: 147/82  Max: 155/91   Resp  Avg: 16  Min: 16  Max: 16 SpO2  Avg: 95 %  Min: 92 %  Max: 97 %          Input/Output    Intake/Output Summary (Last 24 hours) at 12/05/2023 1055  Last data filed at 12/05/2023 0600  Gross per 24 hour   Intake 310 ml   Output 600 ml   Net -290 ml    I/O last shift:  No intake/output data recorded.   acetaminophen  (TYLENOL ) tablet, 650 mg, Oral, Q4H PRN  clonazePAM (klonoPIN) tablet, 0.5 mg, Oral, 2x/day PRN  cyclobenzaprine (FLEXERIL) tablet, 10 mg, Oral, Q8H PRN  D5W 250 mL flush bag, , Intravenous, Q15 Min PRN  D5W 250 mL flush bag, , Intravenous, Q15 Min PRN  D5W 250 mL flush bag, , Intravenous, Q15 Min PRN  HYDROmorphone  (DILAUDID ) 0.5 mg/0.5 mL injection, 0.2 mg, Intravenous, Q4H PRN  ipratropium-albuterol  0.5 mg-3 mg(2.5 mg base)/3 mL Solution for Nebulization, 3 mL, Nebulization, Q4H PRN  lamoTRIgine (LAMICTAL) tablet, 100 mg, Oral, 2x/day  nicotine  (NICODERM CQ ) transdermal patch (mg/24 hr), 21 mg, Transdermal, Daily  NS 250 mL flush bag, , Intravenous, Q15 Min PRN  NS 250 mL flush bag, , Intravenous, Q15 Min PRN  NS 250 mL flush bag, , Intravenous, Q15 Min PRN  NS flush syringe, 2-6 mL, Intracatheter, Q8HRS  NS flush syringe, 2-6 mL, Intracatheter, Q1 MIN PRN  NS flush syringe, 2-6 mL, Intracatheter, Q8HRS  NS flush syringe, 2-6 mL, Intracatheter, Q1 MIN PRN  NS flush syringe, 2-6 mL, Intracatheter, Q8HRS  NS flush syringe, 2-6 mL, Intracatheter, Q1 MIN PRN  ondansetron  (ZOFRAN ) 2 mg/mL injection, 4 mg, Intravenous, Q8H  PRN  oxyCODONE concentrate (ROXICODONE INTENSOL) 20mg  per mL oral liquid, 10 mg, Gastric (NG, OG, PEG, GT), Q4H PRN   And  oxyCODONE concentrate (ROXICODONE INTENSOL) 20mg  per mL oral liquid, 15 mg, Gastric (NG, OG, PEG, GT), Q4H PRN  prochlorperazine  (COMPAZINE ) 5 mg/mL injection, 10 mg, Intravenous, Q6H PRN  scopolamine  1 mg over 3 days transdermal patch, 1 Patch, Transdermal, Q72H  thiamine (VITAMIN B1) 100 mg/mL injection, 100 mg, Intravenous, Q24H  traZODone  (DESYREL ) tablet, 50 mg, Oral, HS PRN        Physical Exam:  GENERAL: Pleasant, No acute distress  PULM: CTA B/L, no wheezes, rales, or rhonchi  CV: RRR, no murmur  GI: abd soft, nontender, nondistended, normoactive BS.  PEG site looks clean  EXTREMITIES: no peripheral edema, pulses intact  NEURO: Alert and oriented x 3     Labs:  Results for orders placed or performed during the hospital encounter of 12/03/23 (from the past 24 hours)   BASIC METABOLIC PANEL    Collection Time: 12/05/23  4:50 AM   Result Value Ref Range    SODIUM 138 136 - 145 mmol/L    POTASSIUM 4.0 3.5 -  5.1 mmol/L    CHLORIDE 107 96 - 111 mmol/L    CO2 TOTAL 25 22 - 30 mmol/L    ANION GAP 6 4 - 13 mmol/L    CALCIUM 9.0 8.6 - 10.2 mg/dL    GLUCOSE 898 65 - 874 mg/dL    BUN 7 (L) 8 - 25 mg/dL    CREATININE 9.29 9.39 - 1.05 mg/dL    eGFRcr - FEMALE >09 >=60 mL/min/1.81m^2    BUN/CREA RATIO 10 6 - 22   PHOSPHORUS    Collection Time: 12/05/23  4:50 AM   Result Value Ref Range    PHOSPHORUS 4.1 2.4 - 4.7 mg/dL   MAGNESIUM    Collection Time: 12/05/23  4:50 AM   Result Value Ref Range    MAGNESIUM 1.9 1.8 - 2.6 mg/dL      Radiology:    Results for orders placed or performed during the hospital encounter of 12/03/23 (from the past 72 hours)   DIAGNOSTIC FLUORO     Status: None    Narrative    *Procedure not read by radiology.    *Please Refer to Procedure Note for result.        PT/OT: No    Consults: GI    Assessment/ Plan:   Active Hospital Problems    Diagnosis    Primary Problem: Encounter  for PEG (percutaneous endoscopic gastrostomy) (CMS HCC)    Feeding by G-tube (CMS HCC)     History of Jackhammer esophagus   History of Gastroparesis  Failure to thrive  -patient came in for elective EGD with PEJ placement by GI.  Postprocedure patient was admitted to medicine for tube feed initiation.  -nutrition consulted.  Tube feeds started on 10/22.  Goal rate 45 mL/hour for 24 hours.  Patient has been tolerating tube feeds well.  -pain control:  Oxycodone and IV Dilaudid  p.r.n.     Chronic Obstructive Pulmonary Disease/asthma  -not in exacerbation  -duo nebs p.r.n.     Seasonal allergies  -citrus seen or Zyrtec  PRN     Deep vein thrombosis prophylaxis:  Ambulate     Holly Fredericks, MD

## 2023-12-05 NOTE — Discharge Summary (Signed)
 [facility]  DISCHARGE SUMMARY    PATIENT NAME:  Tonga, Prout  MRN:  [id number]  DOB:  1981-01-13    ENCOUNTER DATE:  12/03/2023  INPATIENT ADMISSION DATE: 12/03/2023  DISCHARGE DATE:  12/05/2023    ATTENDING PHYSICIAN: Malvin Done, MD  SERVICE: HOSPITALIST 3  PRIMARY CARE PHYSICIAN: Chad Taylor Hott, APRN, CNP   Has PCP been verified with patient and updated? Yes    Lay Caregiver Name: Dejanay Wamboldt Caregiver Contact Number: [phone]   Lay Caregiver Relationship to patient: sibling    PRIMARY DISCHARGE DIAGNOSIS: Encounter for PEG (percutaneous endoscopic gastrostomy) (CMS [facility])  Active Hospital Problems    Diagnosis Date Noted    Principal Problem: Encounter for PEG (percutaneous endoscopic gastrostomy) (CMS HCC) [Z43.1] 12/03/2023    Feeding by G-tube (CMS HCC) [Z93.1] 12/03/2023      Resolved Hospital Problems   No resolved problems to display.     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  Generic drug: aspirin-acetaminophen -caffeine   2 Tablets, EVERY 6 HOURS PRN  Refills: 0            START taking these medications.        Details   oxyCODONE 5 mg Tablet  Commonly known as: ROXICODONE   5 mg, Oral, EVERY 6 HOURS PRN  Qty: 6 Tablet  Refills: 0            CONTINUE these medications which have CHANGED during your visit.        Details   clonazePAM 0.5 mg Tablet  Commonly known as: klonoPIN  What changed: when to take this   Take 1 Tablet (0.5 mg total) by mouth Twice daily  Refills: 0     famotidine  40 mg  Tablet  Commonly known as: PEPCID   What changed: when to take this   40 mg, Oral, 2 TIMES DAILY PRN  Qty: 180 Tablet  Refills: 1     hyoscyamine sulfate 0.125 mg Tablet, Sublingual  Commonly known as: LEVSIN/SL  What changed:   how much to take  when to take this   0.25 mg, Sublingual, 3 TIMES DAILY PRN  Qty: 180 Tablet  Refills: 1     omeprazole  40 mg Capsule, Delayed Release(E.C.)  Commonly known as: PRILOSEC  What changed: when to take this   40 mg, Oral, Daily, 30 min before breakfast.  Ok to open capsule and take in applesauce.  Qty: 90 Capsule  Refills: 1     ondansetron  4 mg Tablet, Rapid Dissolve  Commonly known as: ZOFRAN  ODT  What changed: when to take this   4 mg, Oral, EVERY 8 HOURS PRN  Qty: 12 Tab  Refills: 0            CONTINUE these medications - NO CHANGES were made during your visit.        Details   albuterol  sulfate 90  mcg/actuation oral inhaler  Commonly known as: PROVENTIL  or VENTOLIN  or PROAIR    2 Puffs, Inhalation, EVERY 4 HOURS PRN  Qty: 3 Each  Refills: 1     capsaicin  0.025 % Cream  Commonly known as: ZOSTRIX   Apply Topically, 2 TIMES DAILY PRN  Refills: 0     cetirizine  10 mg Tablet  Commonly known as: zyrTEC    10 mg, 2 TIMES DAILY  Refills: 0     clobetasoL-gauze-silicone 0.05 %- 4 X 4 Kit   Apply externally, as needed  Refills: 0     cyclobenzaprine 10 mg Tablet  Commonly known as: FLEXERIL   Take 1 Tablet (10 mg total) by mouth Every 8 hours as needed for Muscle spasms  Refills: 0     diphenhydrAMINE  25 mg Capsule  Commonly known as: BENADRYL    25 mg, Oral, DAILY PRN  Refills: 0     hydrOXYzine  HCL 50 mg Tablet  Commonly known as: ATARAX    50 mg, Oral, 2 TIMES DAILY  Refills: 0     lamotrigine 100 mg Tablet  Commonly known as: LAMICTAL   1 Tablet, 2 TIMES DAILY  Refills: 0     * lidocaine  5 % Ointment  Commonly known as: XYLOCAINE    APPLY TO AFFECTED AREA 1-4 TIMES DAILY AS NEEDED  Refills: 0     * lidocaine  5 % Adhesive Patch, Medicated  Commonly known as: LIDODERM    APPLY 1 PATCH  BY TOPICAL ROUTE ONCE DAILY (MAY WEAR UP TO 12HOURS.)  Refills: 0     SALINE NOSE NASL   1 Spray, Each Nostril, DAILY PRN  Refills: 0     Tacrolimus 0.1 % Ointment   Topical, 2 TIMES DAILY PRN  Refills: 0     traZODone  50 mg Tablet  Commonly known as: DESYREL    50 mg, NIGHTLY PRN  Refills: 0     turmeric 400 mg Capsule   1 Capsule, Daily  Refills: 0           * This list has 2 medication(s) that are the same as other medications prescribed for you. Read the directions carefully, and ask your doctor or other care provider to review them with you.                ASK your doctor about these medications.        Details   multivitamin Liquid   5 mL, Oral, Daily  Qty: 450 mL  Refills: 1            Discharge med list refreshed?  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  GI decided for scheduled percutaneous endoscopic jejunostomy insertion to stabilize body weight before consideration for G-POEM.  Patient was  admitted for tube feed initiation post percutaneous endoscopic jejunostomy tube insertion on 10/22.  Nutrition was consulted.  Patient was started on tube feeds which she tolerated well.  Case manager help with setting up tube feeds for home.  Patient to follow up with GI outpatient after  discharge.      TRANSITION/POST DISCHARGE CARE/PENDING TESTS/REFERRALS:   PCP and GI follow up    CONDITION ON DISCHARGE:  A. Ambulation: Full ambulation  B. Self-care Ability: Complete  C. Cognitive Status Alert and Oriented x 3  D. Code status at discharge:       LINES/DRAINS/WOUNDS AT DISCHARGE:   Patient Lines/Drains/Airways Status       Active Line / Dialysis Catheter / Dialysis Graft / Drain / Airway / Wound       Name Placement date Placement time Site Days    Peripheral IV Ultrasound guided Anterior;Left;Proximal;Upper Arm 12/04/23  0007  -- 1    Jejunostomy Tube Left 12/03/23  1131  -- 1                    DISCHARGE DISPOSITION:  Home discharge  DISCHARGE INSTRUCTIONS:  Post-Discharge Follow Up Appointments       Monday Dec 15, 2023    Go to Hott, Chad Taylor, APRN, [place]    Phone: [phone]    Where: [address], [place] [facility] [address]      Tuesday Dec 16, 2023    New Patient Visit with Joshua Console, MD at  1:30 PM    Return Patient Visit with Lindajean Kirsch, APRN, CNP at  3:00 PM      Monday Dec 22, 2023    Return Patient Visit with Krizner, Jeannette, SLP at  1:30 PM      Tuesday Apr 20, 2024    Return Patient Visit with Fannie Domino, APRN, CNP at  2:30 PM      ENT, [facility] ENT  [facility], [facility]  [address] [place]  [place] [address]  [phone] Gastroenterology/Hepatology, Physician [facility]  Physician [facility], [facility]  [address] [facility]  [place] [address]  [phone] Pulmonary, [facility] [place]  [address]  [place] [address]  [phone]    Speech Therapy, [facility], [place]  [address]  [place] [address]  [phone]             Refer to Home Health - [facility] - [facility]   Referral Type: Home Health   Requested Specialty: HOME HEALTH AGENCY   Number of Visits Requested:  999     DME - ENTERAL FEEDS    Please remember to order formula, flow rate, and whether feeding is bolus/continuous/nocturnal.    Tube Feed Formula : Ensure plus or 1.5 equivalent  Goal Rate: 45 ml/hr x 24 hours  Provides: 1575 cal = 33 cals/kg                72 g protein = 1.5 g/kg                810 ml free water  plus free water  flushes 105 ml Q 4 hours    [name] Lenis, APRN is following the tube feeds     Indication For Use Nutrition being provided via tube into stomach or small intestine (pt not drinking the nutrient).    I certify that  special nutrient formula is needed due to the following condition Altered GI function related to Current medical condition as evidenced by Need for TF    Freedom of Choice: I have informed patient of their freedom of choice with respect to DME providers    I certify that an infusion pump is medically necessary due to Administration rate <16mL/hr    Estimated Length of Need (in months; 99 mo.= lifetime) 99           [name], MD    Copies sent to Care Team         Relationship Specialty Notifications Start End    Hott, Chad Taylor, APRN, CNP PCP - General FAMILY NURSE PRACTITIONER  12/07/21     Phone: [phone] Fax: [phone]         [address] [place] [address]            Referring providers can utilize [email or web address] to access their referred [facility] patient's information.                               [1]   Allergies  Allergen Reactions    Adhesive Rash    Doxycycline Rash     Agitation      Propranolol Hives/ Urticaria, Itching and Rash    Ciprofloxacin     Gabapentin   Other Adverse Reaction (Add comment)     Patient reports is sensitive to medication.    Levaquin [Levofloxacin]     Penicillins      unknown reaction when an infant - HAS TOLERATED KEFLEX     Tapentadol      Patient reports sensitivity to medication.

## 2023-12-05 NOTE — Transitional Care (Signed)
 [facility]  Transitional Care Coordination  Initial Assessment    Name: Calea Dinan  Address: [address]  [place] [address]  Phone: [phone]   Date of Birth: 1980/02/22  Date of Service: 12/05/2023      PCP information:  Chad Taylor Hott, APRN, [place]  Phone: [phone]  Fax: [phone]      Chart Review:  Patient Status: Chart Review  30 Day Readmission: No        MyChart Status: Active        Current PCP information verified?: No        12/05/23 0834   Assessment Type   Assessment type PCP verification;Appointments   PCP Verification   PCP Verficiation 1st Atttempt;Completed   Contacted PCP to verify establishment and determine barriers to follow up Yes   PCP is a External Provider   Last PCP apt 03/10/23   Does patient have an upcoming appointment scheduled in the next 30 days? No   Is there appointment availability within 7-14 days of discharge? Yes   Has hospital discharge follow up been scheduled? Yes   Appointment added to the AVS? Yes   Is pt being discharged on Warfarin (Coumadin)? No   PCP agreeable to follow warfarin/INR if applicable? Yes   PCP agreeable to follow home health if applicable? Yes   PCP office offers designated personnel for Care Coordination? No   Appointments   Appointments PCP appointment   PCP appointment scheduled within 7 days of discharge   PCP appointment date 12/15/23  (2:00 pm, added to AVS)           Transition Care Assistant: Verneita Bald, MA  Phone Ext: [phone]

## 2023-12-05 NOTE — Discharge Summary (Incomplete Revision)
 [facility]  DISCHARGE SUMMARY    PATIENT NAME:  Holly Fritz, Holly Fritz  MRN:  [id number]  DOB:  1981-01-13    ENCOUNTER DATE:  12/03/2023  INPATIENT ADMISSION DATE: 12/03/2023  DISCHARGE DATE:  12/05/2023    ATTENDING PHYSICIAN: Malvin Done, MD  SERVICE: HOSPITALIST 3  PRIMARY CARE PHYSICIAN: Chad Taylor Hott, APRN, CNP   Has PCP been verified with patient and updated? Yes    Lay Caregiver Name: Dejanay Wamboldt Caregiver Contact Number: [phone]   Lay Caregiver Relationship to patient: sibling    PRIMARY DISCHARGE DIAGNOSIS: Encounter for PEG (percutaneous endoscopic gastrostomy) (CMS [facility])  Active Hospital Problems    Diagnosis Date Noted    Principal Problem: Encounter for PEG (percutaneous endoscopic gastrostomy) (CMS HCC) [Z43.1] 12/03/2023    Feeding by G-tube (CMS HCC) [Z93.1] 12/03/2023      Resolved Hospital Problems   No resolved problems to display.     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  Generic drug: aspirin-acetaminophen -caffeine   2 Tablets, EVERY 6 HOURS PRN  Refills: 0            START taking these medications.        Details   oxyCODONE 5 mg Tablet  Commonly known as: ROXICODONE   5 mg, Oral, EVERY 6 HOURS PRN  Qty: 6 Tablet  Refills: 0            CONTINUE these medications which have CHANGED during your visit.        Details   clonazePAM 0.5 mg Tablet  Commonly known as: klonoPIN  What changed: when to take this   Take 1 Tablet (0.5 mg total) by mouth Twice daily  Refills: 0     famotidine  40 mg  Tablet  Commonly known as: PEPCID   What changed: when to take this   40 mg, Oral, 2 TIMES DAILY PRN  Qty: 180 Tablet  Refills: 1     hyoscyamine sulfate 0.125 mg Tablet, Sublingual  Commonly known as: LEVSIN/SL  What changed:   how much to take  when to take this   0.25 mg, Sublingual, 3 TIMES DAILY PRN  Qty: 180 Tablet  Refills: 1     omeprazole  40 mg Capsule, Delayed Release(E.C.)  Commonly known as: PRILOSEC  What changed: when to take this   40 mg, Oral, Daily, 30 min before breakfast.  Ok to open capsule and take in applesauce.  Qty: 90 Capsule  Refills: 1     ondansetron  4 mg Tablet, Rapid Dissolve  Commonly known as: ZOFRAN  ODT  What changed: when to take this   4 mg, Oral, EVERY 8 HOURS PRN  Qty: 12 Tab  Refills: 0            CONTINUE these medications - NO CHANGES were made during your visit.        Details   albuterol  sulfate 90  mcg/actuation oral inhaler  Commonly known as: PROVENTIL  or VENTOLIN  or PROAIR    2 Puffs, Inhalation, EVERY 4 HOURS PRN  Qty: 3 Each  Refills: 1     capsaicin  0.025 % Cream  Commonly known as: ZOSTRIX   Apply Topically, 2 TIMES DAILY PRN  Refills: 0     cetirizine  10 mg Tablet  Commonly known as: zyrTEC    10 mg, 2 TIMES DAILY  Refills: 0     clobetasoL-gauze-silicone 0.05 %- 4 X 4 Kit   Apply externally, as needed  Refills: 0     cyclobenzaprine 10 mg Tablet  Commonly known as: FLEXERIL   Take 1 Tablet (10 mg total) by mouth Every 8 hours as needed for Muscle spasms  Refills: 0     diphenhydrAMINE  25 mg Capsule  Commonly known as: BENADRYL    25 mg, Oral, DAILY PRN  Refills: 0     hydrOXYzine  HCL 50 mg Tablet  Commonly known as: ATARAX    50 mg, Oral, 2 TIMES DAILY  Refills: 0     lamotrigine 100 mg Tablet  Commonly known as: LAMICTAL   1 Tablet, 2 TIMES DAILY  Refills: 0     * lidocaine  5 % Ointment  Commonly known as: XYLOCAINE    APPLY TO AFFECTED AREA 1-4 TIMES DAILY AS NEEDED  Refills: 0     * lidocaine  5 % Adhesive Patch, Medicated  Commonly known as: LIDODERM    APPLY 1 PATCH  BY TOPICAL ROUTE ONCE DAILY (MAY WEAR UP TO 12HOURS.)  Refills: 0     SALINE NOSE NASL   1 Spray, Each Nostril, DAILY PRN  Refills: 0     Tacrolimus 0.1 % Ointment   Topical, 2 TIMES DAILY PRN  Refills: 0     traZODone  50 mg Tablet  Commonly known as: DESYREL    50 mg, NIGHTLY PRN  Refills: 0     turmeric 400 mg Capsule   1 Capsule, Daily  Refills: 0           * This list has 2 medication(s) that are the same as other medications prescribed for you. Read the directions carefully, and ask your doctor or other care provider to review them with you.                ASK your doctor about these medications.        Details   multivitamin Liquid   5 mL, Oral, Daily  Qty: 450 mL  Refills: 1            Discharge med list refreshed?  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  GI decided for scheduled percutaneous endoscopic jejunostomy insertion to stabilize body weight before consideration for G-POEM.  Patient was  admitted for tube feed initiation post percutaneous endoscopic jejunostomy tube insertion on 10/22.  Nutrition was consulted.  Patient was started on tube feeds which she tolerated well.  Case manager help with setting up tube feeds for home.  Patient to follow up with GI outpatient after  discharge.      TRANSITION/POST DISCHARGE CARE/PENDING TESTS/REFERRALS:   PCP and GI follow up    CONDITION ON DISCHARGE:  A. Ambulation: Full ambulation  B. Self-care Ability: Complete  C. Cognitive Status Alert and Oriented x 3  D. Code status at discharge:       LINES/DRAINS/WOUNDS AT DISCHARGE:   Patient Lines/Drains/Airways Status       Active Line / Dialysis Catheter / Dialysis Graft / Drain / Airway / Wound       Name Placement date Placement time Site Days    Peripheral IV Ultrasound guided Anterior;Left;Proximal;Upper Arm 12/04/23  0007  -- 1    Jejunostomy Tube Left 12/03/23  1131  -- 1                    DISCHARGE DISPOSITION:  Home discharge  DISCHARGE INSTRUCTIONS:  Post-Discharge Follow Up Appointments       Monday Dec 15, 2023    Go to Hott, Chad Taylor, APRN, [place]    Phone: [phone]    Where: [address], [place] [facility] [address]      Tuesday Dec 16, 2023    New Patient Visit with Joshua Console, MD at  1:30 PM    Return Patient Visit with Lindajean Kirsch, APRN, CNP at  3:00 PM      Monday Dec 22, 2023    Return Patient Visit with Krizner, Jeannette, SLP at  1:30 PM      Tuesday Apr 20, 2024    Return Patient Visit with Fannie Domino, APRN, CNP at  2:30 PM      ENT, [facility] ENT  [facility], [facility]  [address] [place]  [place] [address]  [phone] Gastroenterology/Hepatology, Physician [facility]  Physician [facility], [facility]  [address] [facility]  [place] [address]  [phone] Pulmonary, [facility] [place]  [address]  [place] [address]  [phone]    Speech Therapy, [facility], [place]  [address]  [place] [address]  [phone]             Refer to Home Health - [facility] - [facility]   Referral Type: Home Health   Requested Specialty: HOME HEALTH AGENCY   Number of Visits Requested:  999     DME - ENTERAL FEEDS    Please remember to order formula, flow rate, and whether feeding is bolus/continuous/nocturnal.    Tube Feed Formula : Ensure plus or 1.5 equivalent  Goal Rate: 45 ml/hr x 24 hours  Provides: 1575 cal = 33 cals/kg                72 g protein = 1.5 g/kg                810 ml free water  plus free water  flushes 105 ml Q 4 hours    [name] Lenis, APRN is following the tube feeds     Indication For Use Nutrition being provided via tube into stomach or small intestine (pt not drinking the nutrient).    I certify that  special nutrient formula is needed due to the following condition Altered GI function related to Current medical condition as evidenced by Need for TF    Freedom of Choice: I have informed patient of their freedom of choice with respect to DME providers    I certify that an infusion pump is medically necessary due to Administration rate <16mL/hr    Estimated Length of Need (in months; 99 mo.= lifetime) 99           [name], MD    Copies sent to Care Team         Relationship Specialty Notifications Start End    Hott, Chad Taylor, APRN, CNP PCP - General FAMILY NURSE PRACTITIONER  12/07/21     Phone: [phone] Fax: [phone]         [address] [place] [address]            Referring providers can utilize [email or web address] to access their referred [facility] patient's information.                               [1]   Allergies  Allergen Reactions    Adhesive Rash    Doxycycline Rash     Agitation      Propranolol Hives/ Urticaria, Itching and Rash    Ciprofloxacin     Gabapentin   Other Adverse Reaction (Add comment)     Patient reports is sensitive to medication.    Levaquin [Levofloxacin]     Penicillins      unknown reaction when an infant - HAS TOLERATED KEFLEX     Tapentadol      Patient reports sensitivity to medication.

## 2023-12-05 NOTE — Care Management Notes (Addendum)
 [facility]  Care Management Note    Patient Name: Holly Fritz  Date of Birth: [birth year]  Sex: female  Date/Time of Admission: 12/03/2023  8:31 AM  Room/Bed: 779/A  Payor: [place] / Plan: [place] MEDICAID / Product Type: Medicaid [place] /    LOS: 2 days   Primary Care Providers:  Hott, Chad Taylor, APRN, CNP, APRN* (General)    Admitting Diagnosis:  Gastroparesis [K31.84]  Encounter for PEG (percutaneous endoscopic gastrostomy) (CMS HCC) [Z43.1]  Feeding by G-tube (CMS HCC) [Z93.1]    Assessment:      12/05/23 1256   Assessment Details   Assessment Type Continued Assessment   Date of Care Management Update 12/05/23   Date of Next DCP Update 12/08/23   Care Management Plan   Discharge Planning Status discharge plan complete   Projected Discharge Date 12/05/23   Discharge plan discussed with: Patient   CM will evaluate for rehabilitation potential yes   Form for patient choice reviewed/signed and on chart yes   Facility or Agency Preferences [facility] and [facility]   Discharge Needs Assessment   Equipment Needed After Discharge none   Discharge Facility/Level of Care Needs Home with Home Health and Home Infusion   Transportation Available car;family or friend will provide   Transportation Arranged None   Discharge Information   Discharge Disposition home health;home infusion   Home Infusion [facility]   Home Health [facility]   Lay Caregiver Notified of Discharge Yes   Lay Caregiver Name [name] Caregiver Date Notified 12/05/23   Lay Caregiver Time Notified 1257   3 Day Stay Verified: N/A   Discharge Date 12/05/23         Discharge Plan:  Home with Home Health and Home Infusion    MSW spoke with Dr. Malvin with [facility] this morning and she stated pt is medically d/c ready. [facility] and [facility] following. Pt spoke with MSW yesterday regarding not being able to afford tube feeds (see CM note from 10/23).     BSN reported to MSW that pt is tolerating tube feeds which is  Ensure Plus.    MSW spoke with Dr. Zahid and [name], RDLD regarding pt's discharge via secure chat. [name] stated she is going home on Ensure Plus. She stated pt's goal of 45 ml/hr translates to roughly  4.5 cans a day. She provided pt with coupons yesterday and will give her 5 cartons to go home with as well.      [facility] completed HH and HI order. Service signed orders.     MSW spoke with Roxanne with [facility] via secure chat and she stated PCP will follow HH order if pt goes to her apt on 12/15/23 since he has not seen her since January. Roxanne confirmed they accept pt's insurance and SOC is 10/26. MSW made pt aware to go to her apt on 11/3. Pt verbalized understanding.     MSW spoke with Lynwood with [facility] who reported feeding pump will be delivered at 2pm. MSW later received a call from Lynwood stating delivery driver forgot to pick up pt's feeding pump so it will now be delivered at 6pm.     MSW and pt called [employer] Patient Assistance Program [phone] and spoke with Adrien. Pt exlpained to Adrien her financial situation. Adrien explained what needs to be completed on application. She stated since pt has zero income, a statement how pt has zero income needs  to be on hospital letter head and signed by pt or MSW. She stated since pt lives in [place] then copy of insurance card does not need to be fax. She stated the application and letter head needs to be fax to [phone]. She informed pt that they have around 150 applications so it will take awhile to get to her application and it takes up to a week to process and 1-3 weeks for delivery. She explained this will not happen over night and takes awhile to process. Pt verbalized understanding.   Pt signed and dated letter head letter. Application was completed and signed by pt and provider. MSW faxed application and signed letterhead letter into careport. MSW faxed application and letter head to [employer] via careport to [phone]. MSW  provided application and letterhead letter back to pt.     MSW also explained to pt the importance of applying for food stamps to see if she would qualify. MSW explained food stamps could help her pay for the tube feeds. Pt verbalized understanding.     CM Director Yasmin approved CM dept to cover 1 month of Ensure Plus (tube feeds) for pt. BSN picked up 6 cases of tube feeds from nutrition services and was delivered to bedside.     AVS updated. BSN and service updated. MSW updated Rever Pichette (lay caregiver) [phone].     Pt was complaining of pain and ride could not stay at hospital till 6pm for feeding pump delivery. Service emt with pt at bedside. MSW spoke with Dr. Malvin and pt will now d/c tomorrow. Pt reported to MSW and BSN that her ride will be here tomorrow at 12pm.     MSW made Lynwood with [facility] aware that pt is now d/c home tomorrow so 6pm delivery of feeding pump can be delivered to bedside and not d/c lounge.    MSW sent task for weekend d/c planner to follow.    FOC obtained for [facility] and [facility] and palced in CM dept for scanning.     BSN and service aware.    The patient will continue to be evaluated for developing discharge needs.     Case Manager: Rosina Marina, SOCIAL WORKER  Phone: [phone]

## 2023-12-05 NOTE — Pharmacy (Signed)
 Pharmacy Medication Reconciliation    Patient Name: Holly Fritz, Holly Fritz  Date of Service: 12/04/2023  Date of Admission: 12/03/2023  Date of Birth: 01-01-1981  Length of Stay:   1 day   Service: [facility]      Transitions of Care:  Discharge Pharmacy services were discussed with the patient and the Meds to [facility] flowsheet and preferred pharmacy information were updated in EMR if applicable and able to assess with patient.    Information was collected from:  Patient and Pharmacy    [facility] [address] [place] [address]  [employer] [employer]  [place] [address]  Phone: [phone] Fax: [phone]      Does the Patient have Prescription Coverage?     Clarified Prior to Admission Medications:  Prior to Admission medications   Medication Sig Taking Resumed Y/N (RPh) Comments   albuterol  sulfate (PROVENTIL  OR VENTOLIN  OR PROAIR ) 90 mcg/actuation Inhalation oral inhaler Take 2 Puffs by inhalation Every 4 hours as needed Indications: bronchospasm prevention     prn   aspirin-acetaminophen -caffeine (EXCEDRIN) 250-250-65 mg Oral Tablet Take 2 Tablets by mouth Every 6 hours as needed for Migraine  Patient not taking: Reported on 12/04/2023        capsaicin  (ZOSTRIX) 0.025 % Cream Apply topically Twice per day as needed     prn    cetirizine  (ZYRTEC ) 10 mg Oral Tablet Take 1 Tablet (10 mg total) by mouth Twice daily Yes  n     clobetasoL-gauze-silicone 0.05 %- 4 X 4 Apply externally Kit Apply topically as needed     prn    clonazePAM (KLONOPIN) 0.5 mg Oral Tablet Take 1 Tablet (0.5 mg total) by mouth Twice daily  Patient taking differently: Take 1 Tablet (0.5 mg total) by mouth Twice daily Yes  y  10.8.25 10 day    cyclobenzaprine (FLEXERIL) 10 mg Oral Tablet Take 1 Tablet (10 mg total) by mouth Every 8 hours as needed for Muscle spasms   y  prn    diphenhydrAMINE  (BENADRYL ) 25 mg Oral Capsule Take 1 Capsule (25 mg total) by mouth Once per day as needed for Itching     prn    famotidine  (PEPCID ) 40 mg Oral Tablet  Take 1 Tablet (40 mg total) by mouth Twice per day as needed Indications: heartburn  Patient taking differently: Take 1 Tablet (40 mg total) by mouth Twice daily Indications: heartburn Yes       hydrOXYzine  HCL (ATARAX ) 50 mg Oral Tablet Take 1 Tablet (50 mg total) by mouth Twice daily Yes       hyoscyamine sulfate (LEVSIN/SL) 0.125 mg Sublingual Tablet, Sublingual Place 2 Tablets (0.25 mg total) under the tongue Three times a day as needed  Patient taking differently: Place 1 Tablet (0.125 mg total) under the tongue Three times a day Yes       lamoTRIgine (LAMICTAL) 100 mg Oral Tablet Take 1 Tablet (100 mg total) by mouth Twice daily Yes  y     lidocaine  (LIDODERM ) 5 % Adhesive Patch, Medicated APPLY 1 PATCH BY TOPICAL ROUTE ONCE DAILY (MAY WEAR UP TO 12HOURS.)     prn    lidocaine  (XYLOCAINE ) 5 % Ointment APPLY TO AFFECTED AREA 1-4 TIMES DAILY AS NEEDED     prn    multivitamin Oral Liquid Take 5 mL by mouth Daily for 180 days Indications: treatment to prevent vitamin deficiency  Patient not taking: Reported on 11/24/2023  omeprazole  (PRILOSEC) 40 mg Oral Capsule, Delayed Release(E.C.) Take 1 Capsule (40 mg total) by mouth Daily 30 min before breakfast.  Ok to open capsule and take in applesauce.  Patient taking differently: Take 1 Capsule (40 mg total) by mouth Twice daily 30 min before breakfast.  Ok to open capsule and take in applesauce. Yes  y     ondansetron  (ZOFRAN  ODT) 4 mg Oral Tablet, Rapid Dissolve Take 1 Tab (4 mg total) by mouth Every 8 hours as needed for Nausea/Vomiting  Patient taking differently: Take 1 Tablet (4 mg total) by mouth Daily Yes  y     sodium chloride  (SALINE NOSE NASL) Administer 1 Spray into each nostril Once per day as needed     prn    Tacrolimus 0.1 % Ointment Apply topically Twice per day as needed     prn    traZODone  (DESYREL ) 50 mg Oral Tablet Take 1 Tablet (50 mg total) by mouth Every night as needed for Insomnia   y  prn    turmeric 400 mg Oral Capsule Take 1 Capsule  (400 mg total) by mouth Daily Yes           Did patient's home medication list require updates? Yes    Medications UPDATED on Prior to Admission Med List:  noted in red as pt not taking   noted in red as pt taking differently  hydrOXYzine  25 mg to 50 mg    Medications ADDED to Prior to Admission Med List:  none     Allergies:    Allergies   Allergen Reactions    Adhesive Rash    Doxycycline Rash     Agitation      Propranolol Hives/ Urticaria, Itching and Rash    Ciprofloxacin     Gabapentin   Other Adverse Reaction (Add comment)     Patient reports is sensitive to medication.    Levaquin [Levofloxacin]     Penicillins      unknown reaction when an infant - HAS TOLERATED KEFLEX     Tapentadol      Patient reports sensitivity to medication.       Maeola Batter, Pharmacy Technician    R. Leontine Harrow, PHARMD

## 2023-12-06 ENCOUNTER — Other Ambulatory Visit: Payer: Self-pay

## 2023-12-06 LAB — BASIC METABOLIC PANEL
ANION GAP: 5 mmol/L (ref 4–13)
BUN/CREA RATIO: 13 (ref 6–22)
BUN: 9 mg/dL (ref 8–25)
CALCIUM: 9.4 mg/dL (ref 8.6–10.2)
CHLORIDE: 104 mmol/L (ref 96–111)
CO2 TOTAL: 30 mmol/L (ref 22–30)
CREATININE: 0.72 mg/dL (ref 0.60–1.05)
GLUCOSE: 101 mg/dL (ref 65–125)
POTASSIUM: 3.5 mmol/L (ref 3.5–5.1)
SODIUM: 139 mmol/L (ref 136–145)
eGFRcr - FEMALE: >90 mL/min/1.73mˆ2 (ref >=60)

## 2023-12-06 LAB — PHOSPHORUS: PHOSPHORUS: 3.9 mg/dL (ref 2.4–4.7)

## 2023-12-06 LAB — MAGNESIUM: MAGNESIUM: 2 mg/dL (ref 1.8–2.6)

## 2023-12-06 MED ORDER — OXYCODONE 5 MG TABLET
5.0000 mg | ORAL_TABLET | Freq: Four times a day (QID) | ORAL | 0 refills | Status: AC | PRN
Start: 2023-12-06 — End: 2023-12-10
  Filled 2023-12-06: qty 12, 3d supply, fill #0

## 2023-12-06 NOTE — Progress Notes (Signed)
 [facility]  Medicine Progress Note    Holly Fritz  Date of service: 12/06/2023  Date of Admission:  12/03/2023    Hospital Day:  LOS: 3 days     Subjective:  No issues mentioned.  Abdominal pain is better today.  Agreeable to go home today.     Vital Signs:  Temp  Avg: 36.8 C (98.2 F)  Min: 36.6 C (97.9 F)  Max: 37 C (98.6 F)    Pulse  Avg: 72.8  Min: 66  Max: 82 BP  Min: 132/84  Max: 168/92   Resp  Avg: 16.3  Min: 15  Max: 18 SpO2  Avg: 95.3 %  Min: 93 %  Max: 98 %          Input/Output    Intake/Output Summary (Last 24 hours) at 12/06/2023 0935  Last data filed at 12/06/2023 0600  Gross per 24 hour   Intake 330 ml   Output --   Net 330 ml    I/O last shift:  No intake/output data recorded.   acetaminophen  (TYLENOL ) tablet, 650 mg, Oral, Q4H PRN  clonazePAM (klonoPIN) tablet, 0.5 mg, Oral, 2x/day PRN  cyclobenzaprine (FLEXERIL) tablet, 10 mg, Oral, Q8H PRN  D5W 250 mL flush bag, , Intravenous, Q15 Min PRN  D5W 250 mL flush bag, , Intravenous, Q15 Min PRN  D5W 250 mL flush bag, , Intravenous, Q15 Min PRN  HYDROmorphone  (DILAUDID ) 0.5 mg/0.5 mL injection, 0.2 mg, Intravenous, Q4H PRN  ipratropium-albuterol  0.5 mg-3 mg(2.5 mg base)/3 mL Solution for Nebulization, 3 mL, Nebulization, Q4H PRN  lamoTRIgine (LAMICTAL) tablet, 100 mg, Gastric (NG, OG, PEG, GT), 2x/day  nicotine  (NICODERM CQ ) transdermal patch (mg/24 hr), 21 mg, Transdermal, Daily  NS 250 mL flush bag, , Intravenous, Q15 Min PRN  NS 250 mL flush bag, , Intravenous, Q15 Min PRN  NS 250 mL flush bag, , Intravenous, Q15 Min PRN  NS flush syringe, 2-6 mL, Intracatheter, Q8HRS  NS flush syringe, 2-6 mL, Intracatheter, Q1 MIN PRN  NS flush syringe, 2-6 mL, Intracatheter, Q8HRS  NS flush syringe, 2-6 mL, Intracatheter, Q1 MIN PRN  NS flush syringe, 2-6 mL, Intracatheter, Q8HRS  NS flush syringe, 2-6 mL, Intracatheter, Q1 MIN PRN  ondansetron  (ZOFRAN ) 2 mg/mL injection, 4 mg, Intravenous, Q8H PRN  oxyCODONE concentrate (ROXICODONE INTENSOL)  20mg  per mL oral liquid, 10 mg, Gastric (NG, OG, PEG, GT), Q4H PRN   And  oxyCODONE concentrate (ROXICODONE INTENSOL) 20mg  per mL oral liquid, 15 mg, Gastric (NG, OG, PEG, GT), Q4H PRN  prochlorperazine  (COMPAZINE ) 5 mg/mL injection, 10 mg, Intravenous, Q6H PRN  scopolamine  1 mg over 3 days transdermal patch, 1 Patch, Transdermal, Q72H  thiamine (VITAMIN B1) 100 mg/mL injection, 100 mg, Intravenous, Q24H  traZODone  (DESYREL ) tablet, 50 mg, Oral, HS PRN        Physical Exam:  GENERAL: Pleasant, No acute distress  PULM: CTA B/L, no wheezes, rales, or rhonchi  CV: RRR, no murmur  GI: abd soft, nontender, nondistended, normoactive BS.  PEG site looks clean  EXTREMITIES: no peripheral edema, pulses intact  NEURO: Alert and oriented x 3     Labs:  Results for orders placed or performed during the hospital encounter of 12/03/23 (from the past 24 hours)   BASIC METABOLIC PANEL    Collection Time: 12/06/23  5:13 AM   Result Value Ref Range    SODIUM 139 136 - 145 mmol/L    POTASSIUM 3.5 3.5 - 5.1 mmol/L    CHLORIDE  104 96 - 111 mmol/L    CO2 TOTAL 30 22 - 30 mmol/L    ANION GAP 5 4 - 13 mmol/L    CALCIUM 9.4 8.6 - 10.2 mg/dL    GLUCOSE 898 65 - 874 mg/dL    BUN 9 8 - 25 mg/dL    CREATININE 9.27 9.39 - 1.05 mg/dL    eGFRcr - FEMALE >09 >=60 mL/min/1.71m^2    BUN/CREA RATIO 13 6 - 22   PHOSPHORUS    Collection Time: 12/06/23  5:13 AM   Result Value Ref Range    PHOSPHORUS 3.9 2.4 - 4.7 mg/dL   MAGNESIUM    Collection Time: 12/06/23  5:13 AM   Result Value Ref Range    MAGNESIUM 2.0 1.8 - 2.6 mg/dL      Radiology:    Results for orders placed or performed during the hospital encounter of 12/03/23 (from the past 72 hours)   DIAGNOSTIC FLUORO     Status: None    Narrative    *Procedure not read by radiology.    *Please Refer to Procedure Note for result.        PT/OT: No    Consults: GI    Assessment/ Plan:   Active Hospital Problems    Diagnosis    Primary Problem: Encounter for PEG (percutaneous endoscopic gastrostomy) (CMS  HCC)    Feeding by G-tube (CMS HCC)     History of [place] esophagus   History of Gastroparesis  Failure to thrive  -patient came in for elective EGD with PEJ placement by GI.  Postprocedure patient was admitted to medicine for tube feed initiation.  -nutrition consulted.  Tube feeds started on 10/22.  Goal rate 45 mL/hour for 24 hours.  Patient has been tolerating tube feeds well.  -pain control:  Oxycodone and IV Dilaudid  p.r.n.     Chronic Obstructive Pulmonary Disease/asthma  -not in exacerbation  -duo nebs p.r.n.     Seasonal allergies  -citrus seen or Zyrtec  PRN     Deep vein thrombosis prophylaxis:  Ambulate     [name], MD

## 2023-12-06 NOTE — Nurses Notes (Signed)
 Patient to be d/c to home. AVS reviewed with patient. Patient educated on activity, diet, incision care/dressing changes, medications, importance of regular BM's, fall prevention, s/s of infection, DVT prevention/detection, follow up appointments, and when to seek medical attention. Patient verbalized understanding with no further questions at this time. PIV removed, catheter intact. Family to provide transport home. Prescription medications delivered to room by d/c pharmacy. Patient has all belongings.

## 2023-12-06 NOTE — Discharge Summary (Signed)
 [facility]  DISCHARGE SUMMARY    PATIENT NAME:  Holly Fritz, Holly Fritz  MRN:  [id number]  DOB:  [birth year]    ENCOUNTER DATE:  12/03/2023  INPATIENT ADMISSION DATE: 12/03/2023  DISCHARGE DATE:  12/06/2023    ATTENDING PHYSICIAN: Malvin Done, MD  SERVICE: HOSPITALIST 3  PRIMARY CARE PHYSICIAN: Chad Taylor Hott, APRN, CNP   Has PCP been verified with patient and updated? Yes    Lay Caregiver Name: Orpha Dain Caregiver Contact Number: [phone]   Lay Caregiver Relationship to patient: sibling    PRIMARY DISCHARGE DIAGNOSIS: Encounter for PEG (percutaneous endoscopic gastrostomy) (CMS [facility])  Active Hospital Problems    Diagnosis Date Noted    Principal Problem: Encounter for PEG (percutaneous endoscopic gastrostomy) (CMS [facility]) [Z43.1] 12/03/2023    Feeding by G-tube (CMS HCC) [Z93.1] 12/03/2023      Resolved Hospital Problems   No resolved problems to display.     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  Generic drug: aspirin-acetaminophen -caffeine   2 Tablets, EVERY 6 HOURS PRN  Refills: 0            START taking these medications.        Details   oxyCODONE 5 mg Tablet  Commonly known as: ROXICODONE   5 mg, Oral, EVERY 6 HOURS PRN  Qty: 12 Tablet  Refills: 0            CONTINUE these medications which have CHANGED during your visit.        Details   clonazePAM 0.5 mg Tablet  Commonly known as: klonoPIN  What changed: when to take this   Take 1 Tablet (0.5 mg total) by mouth Twice daily  Refills: 0     famotidine  40 mg  Tablet  Commonly known as: PEPCID   What changed: when to take this   40 mg, Oral, 2 TIMES DAILY PRN  Qty: 180 Tablet  Refills: 1     hyoscyamine sulfate 0.125 mg Tablet, Sublingual  Commonly known as: LEVSIN/SL  What changed:   how much to take  when to take this   0.25 mg, Sublingual, 3 TIMES DAILY PRN  Qty: 180 Tablet  Refills: 1     omeprazole  40 mg Capsule, Delayed Release(E.C.)  Commonly known as: PRILOSEC  What changed: when to take this   40 mg, Oral, Daily, 30 min before breakfast.  Ok to open capsule and take in applesauce.  Qty: 90 Capsule  Refills: 1     ondansetron  4 mg Tablet, Rapid Dissolve  Commonly known as: ZOFRAN  ODT  What changed: when to take this   4 mg, Oral, EVERY 8 HOURS PRN  Qty: 12 Tab  Refills: 0            CONTINUE these medications - NO CHANGES were made during your visit.        Details   albuterol  sulfate 90  mcg/actuation oral inhaler  Commonly known as: PROVENTIL  or VENTOLIN  or PROAIR    2 Puffs, Inhalation, EVERY 4 HOURS PRN  Qty: 3 Each  Refills: 1     capsaicin  0.025 % Cream  Commonly known as: ZOSTRIX   Apply Topically, 2 TIMES DAILY PRN  Refills: 0     cetirizine  10 mg Tablet  Commonly known as: zyrTEC    10 mg, 2 TIMES DAILY  Refills: 0     clobetasoL-gauze-silicone 0.05 %- 4 X 4 Kit   Apply externally, as needed  Refills: 0     cyclobenzaprine 10 mg Tablet  Commonly known as: FLEXERIL   Take 1 Tablet (10 mg total) by mouth Every 8 hours as needed for Muscle spasms  Refills: 0     diphenhydrAMINE  25 mg Capsule  Commonly known as: BENADRYL    25 mg, Oral, DAILY PRN  Refills: 0     hydrOXYzine  HCL 50 mg Tablet  Commonly known as: ATARAX    50 mg, Oral, 2 TIMES DAILY  Refills: 0     lamotrigine 100 mg Tablet  Commonly known as: LAMICTAL   1 Tablet, 2 TIMES DAILY  Refills: 0     * lidocaine  5 % Ointment  Commonly known as: XYLOCAINE    APPLY TO AFFECTED AREA 1-4 TIMES DAILY AS NEEDED  Refills: 0     * lidocaine  5 % Adhesive Patch, Medicated  Commonly known as: LIDODERM    APPLY 1 PATCH  BY TOPICAL ROUTE ONCE DAILY (MAY WEAR UP TO 12HOURS.)  Refills: 0     SALINE NOSE NASL   1 Spray, Each Nostril, DAILY PRN  Refills: 0     Tacrolimus 0.1 % Ointment   Topical, 2 TIMES DAILY PRN  Refills: 0     traZODone  50 mg Tablet  Commonly known as: DESYREL    50 mg, NIGHTLY PRN  Refills: 0     turmeric 400 mg Capsule   1 Capsule, Daily  Refills: 0           * This list has 2 medication(s) that are the same as other medications prescribed for you. Read the directions carefully, and ask your doctor or other care provider to review them with you.                ASK your doctor about these medications.        Details   multivitamin Liquid   5 mL, Oral, Daily  Qty: 450 mL  Refills: 1            Discharge med list refreshed?  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  Patient was admitted for tube feed initiation post percutaneous endoscopic jejunostomy tube insertion on 10/22.  Nutrition was consulted.  Patient was started on tube feeds which she tolerated well.  Case manager help with setting up  tube feeds for home.  Patient to follow up with GI outpatient after discharge.      TRANSITION/POST DISCHARGE CARE/PENDING TESTS/REFERRALS:     PCP and GI follow up    CONDITION ON DISCHARGE:  A. Ambulation: Full ambulation  B. Self-care Ability: Complete  C. Cognitive Status Alert and Oriented x 3  D. Code status at discharge:        LINES/DRAINS/WOUNDS AT DISCHARGE:   Patient Lines/Drains/Airways Status       Active Line / Dialysis Catheter / Dialysis Graft / Drain / Airway / Wound       Name Placement date Placement time Site Days    Peripheral IV Ultrasound guided Anterior;Left;Proximal;Upper Arm 12/04/23  0007  -- 2    Jejunostomy Tube Left 12/03/23  1131  -- 2                    DISCHARGE DISPOSITION:  Home discharge  DISCHARGE INSTRUCTIONS:  Post-Discharge Follow Up Appointments       Monday Dec 15, 2023    Go to Hott, Chad Taylor, APRN, [place]    Phone: [phone]    Where: [address], [place] [facility] [address]      Tuesday Dec 16, 2023    New Patient Visit with Joshua Console, MD at  1:30 PM    Return Patient Visit with Lindajean Kirsch, APRN, CNP at  3:00 PM      Monday Dec 22, 2023    Return Patient Visit with Krizner, Jeannette, SLP at  1:30 PM      Tuesday Apr 20, 2024    Return Patient Visit with Fannie Domino, APRN, CNP at  2:30 PM      ENT, [facility] ENT  [facility] ENT, [facility]  [address] [place]  [place] [address]  [phone] Gastroenterology/Hepatology, Physician [facility]  Physician Office [place], [facility]  [address] [facility]  [place] [address]  [phone] Pulmonary, Sherrell Santee [facility], [place]  [address]  [place] [address]  [phone]    Speech Therapy, [facility], [place]  [address]  [place] [address]  [phone]             Refer to Home Health - [facility]   Referral Type: Home Health   Requested Specialty: HOME HEALTH AGENCY   Number of Visits Requested: 999     DME - ENTERAL FEEDS    Please remember to order formula, flow rate, and whether feeding is bolus/continuous/nocturnal.    Tube Feed Formula : Ensure plus or 1.5 equivalent  Goal Rate: 45 ml/hr x 24 hours  Provides: 1575 cal = 33 cals/kg                72 g  protein = 1.5 g/kg                810 ml free water  plus free water  flushes 105 ml Q 4 hours    [name] Lenis, APRN is following the tube feeds     Indication For Use Nutrition being provided via tube into stomach or small intestine (pt not drinking the nutrient).    I certify that special nutrient formula is needed due to the following condition Altered GI function related to Current medical condition as evidenced by Need for TF    Freedom of Choice: I have informed  patient of their freedom of choice with respect to DME providers    I certify that an infusion pump is medically necessary due to Administration rate <148mL/hr    Estimated Length of Need (in months; 99 mo.= lifetime) 99           [name], MD    Copies sent to Care Team         Relationship Specialty Notifications Start End    Hott, Chad Taylor, APRN, CNP PCP - General FAMILY NURSE PRACTITIONER  12/07/21     Phone: [phone] Fax: [phone]         [address] [place] [address]            Referring providers can utilize [email or web address] to access their referred [facility] patient's information.                                 [1]   Allergies  Allergen Reactions    Adhesive Rash    Doxycycline Rash     Agitation      Propranolol Hives/ Urticaria, Itching and Rash    Ciprofloxacin     Gabapentin   Other Adverse Reaction (Add comment)     Patient reports is sensitive to medication.    Levaquin [Levofloxacin]     Penicillins      unknown reaction when an infant - HAS TOLERATED KEFLEX     Tapentadol      Patient reports sensitivity to medication.

## 2023-12-07 ENCOUNTER — Non-Acute Institutional Stay: Attending: Family

## 2023-12-08 ENCOUNTER — Telehealth (INDEPENDENT_AMBULATORY_CARE_PROVIDER_SITE_OTHER): Payer: Self-pay | Admitting: Gastroenterology

## 2023-12-08 ENCOUNTER — Other Ambulatory Visit: Payer: Self-pay

## 2023-12-08 ENCOUNTER — Ambulatory Visit (HOSPITAL_BASED_OUTPATIENT_CLINIC_OR_DEPARTMENT_OTHER): Payer: Self-pay | Admitting: Speech-Language Pathologist

## 2023-12-08 DIAGNOSIS — R1312 Dysphagia, oropharyngeal phase: Secondary | ICD-10-CM

## 2023-12-08 NOTE — Telephone Encounter (Addendum)
 Called patient to discuss medication options for dysphagia. Left voicemail with office phone number for call back.    Norleen Birmingham, NURSE COORDINATOR      ----- Message from Tinnie JAYSON Ned, APRN, CNP sent at 12/08/2023  8:09 AM EDT -----  Regarding: RE: Manometry opinion  Laiba Fuerte - please call patient. Dr. Tsosie wanted to attempt to treat her medically first. I prescribed hycosamine. Please ask her if this helped. If not, Dr. Tsosie wants to try isosorbide or a CCB like diltiazem (please assess if she has tried these in the past at an outside facility). If these are ineffective, please have Josh schedule her for an in person OV to speak with Dr. Tsosie about the POEM.     Tinnie JAYSON Ned, APRN, CNP  ----- Message -----  From: Bernetta Donnice Ade, MD  Sent: 12/05/2023   4:45 PM EDT  To: Sheril Tsosie, MD; Tinnie JAYSON Ned, APRN, CN#  Subject: RE: Manometry opinion                            Lauren / Sheril    Should Thakkar see in clinic for consideration of E-POEM? Patient interested. I just placed a J-tube for nutrition in the interim.    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 Do you think CCB's would help at all?  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 She's fixated on her dysphagia; she also has gastroparesis. Wants to know what POEM to get. I'm doing J-tube in the meantime b/c she's malnourished and looking cachectic.    Let me know if patient  should see Thakkar.    Thanks!  Donnice Ade Bernetta, MD

## 2023-12-09 ENCOUNTER — Other Ambulatory Visit: Payer: Self-pay

## 2023-12-09 NOTE — Home Health (Signed)
 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 GI decided for scheduled percutaneous endoscopic jejunostomy insertion to stabilize body weight before consideration for G-POEM. Patient was admitted for tube feed initiation post percutaneous endoscopic jejunostomy tube insertion on 10/22. Nutrition was consulted. Patient was started on tube feeds which she tolerated well.    Skilled Need: EDUCATION ON TUBE FEEDING, CARE OF TUBE SITE, EDUCATION ON DISEASE AND MEDICAITON MANAGEMENT  Homebound Status: CAREGIVER DEPENDENT WEAKNESS FALL RISK AND LIMITED EDURANCE  Risk for Rehospitalization: multiple comorbidities and complicated medication profile    Patient's Goal: REGAIN STRENGTH AND RETURN TO OUT PATIENT THERAPY  Patient's Strengths: MOTIVATION  Care Preferences: REMAIN IN HER HOME WITH SISTER  Medication reconciliation completed with AVS, EPIC, and pill bottles. Allergies reviewed.   PATIENT is responsible for medication administration.   Complaint with medication regiment: taking medications as prescribed  Medication administration safety reviewed with patient.     Primary Caregiver: self.   Caregiver confidence level: Quite confident.   Caregiver availability: SISTER IS  LIMITED WITH AVAILABILITY     Physical Assessment:  VS STABLE LUNGS CLEAR BOWEL SOUNDS PRESENT BUT SUFFERING FROM CONSTIPATION HAD TAKEN MIRALAX  THIS MORNGING AND IS CURRENTLY PASSING ALOT OF GAS. SN ASSESSED TUBE FEEDING AND PATIENT HAD IT GOING IN AT 45ML/HR AND IS DOING THE H20 FLUSHES AND FREE WATER  AS INST FROM HOSPITAL PATIENT WANT TO REGAIN SOME STRENGTH AND DECREASE IN ABD TENDERNESS TO BE ABLE TO RETURN TO OUT PATIENT THERAPY PATIENT DOES NO USE ASSISTIVE DEVICE FOR WALKING BUT IS WEAK AND UNSTEADY GAIT BUT DECLINED OFFER OF HOME PT AT THIS TIME. SN EDUCATION TUBE FEEDINGS, TO KEEP CAN OF COKE AROUND SO IF TUBE CLOGS SHE CAN USE WARM COKE TO UNCLOG THE TUBE. SN EDUCATION SITE CLEANING AND MANAGMENT. PATIENT CAN TOLORATED SOME FOODS AT TIMES IT COMES AND GOES SO SHE STILL EATS BY MOUTH SOME. PATIENT MANAGES ALL HER OWN MEDICATIONS HE SISTER WORKS FULL TIME AND IS NOT ALWAYS HOME PATIENT IS AT 3//10 IN ABD THAT IS EXPLAINED MORE OF TENDERNESS. SN EDUCATION ON PREVENTION OF CONSTIPATION AND NEED TO TAKE STOOL SOFTENERS AS NEEDED ONCE SHE HAS A GOOD BOWEL MOVEMENT. MEDICATIONS REVIWED ALL QUESTIONS ANSWERED PATIENT VERB UNDERSTANDING OF EDUCATION.     Orientation status: oriented to person, oriented to place, oriented to time and oriented to situation   Behavior: alert and cooperative  Integumentary: Intact       Wounds: FEEDING TUBE  Respiratory status: Pulmonary: Normal respiratory effort, Clear to auscultation bilaterally       Oxygen: NONE   Cardiac status: No exercise intolerance, No chest pain, No shortness of breath, No dyspnea, No edema, No palpitations, No syncope  GI status: Constipation  GU status: no urinary urgency, frequency, dysuria, nocturia, hesitancy, or incontinence  Functional Status: Minimum assistance    Pain level  3/10, pain is controlled.  Additional pain information:  MORE TENDERNESS THAN PAIN    Home health binder and call-procedure education with patient.  Education provided on: TUBE  FEEDING AND HYDRATION NEEDS, TUBE SITE CARE DAILY, SX OF COMPLICATION TO REPORT, HOW TO UNCLOG FEEDING TUBE, CONSTIPATION PREVENTION . PATIENT VERB UNDERSTANDING  V    Issues noted at [facility]: TUBE FEEDING/NUTRITION/HYDRATION/CONSTIPATION  Pain level {NUMBERS;3/10 IN ABD MORE OF TENDERNESS     Reviewed skilled need and treatment plan with Hott, Chad Taylor, APRN, CNP  Discharge Planning: WHEN GOAL MET OR NO LONGER HOMEBOUND     Plan for next visit: GENERAL ASSESSMENT, CK CONSTIPATION STATUS, PAIN STATUS, ASSESS FEED TUBE SITE AND BE SURE IS DOING FEEDING AS INST

## 2023-12-10 ENCOUNTER — Other Ambulatory Visit: Payer: Self-pay

## 2023-12-10 ENCOUNTER — Telehealth (INDEPENDENT_AMBULATORY_CARE_PROVIDER_SITE_OTHER): Payer: Self-pay | Admitting: Gastroenterology

## 2023-12-10 ENCOUNTER — Encounter (INDEPENDENT_AMBULATORY_CARE_PROVIDER_SITE_OTHER): Payer: Self-pay

## 2023-12-10 ENCOUNTER — Other Ambulatory Visit (INDEPENDENT_AMBULATORY_CARE_PROVIDER_SITE_OTHER): Payer: Self-pay | Admitting: PHYSICIAN ASSISTANT

## 2023-12-10 ENCOUNTER — Other Ambulatory Visit (HOSPICE_FACILITY)

## 2023-12-10 DIAGNOSIS — K5900 Constipation, unspecified: Secondary | ICD-10-CM

## 2023-12-10 NOTE — Home Health (Signed)
 Admitting Diagnosis: Encounter for PEG (percutaneous endoscopic gastrostomy)   Medication administration safety reviewed with patient.   Primary Caregiver: self.   Caregiver confidence level: Quite confident.   Caregiver availability: SISTER IS LIMITED WITH AVAILABILITY   Physical Assessment:   VS STABLE LUNGS CLEAR BOWEL SOUNDS PRESENT BUT SUFFERING FROM CONSTIPATION HAD TAKEN MIRALAX  THIS MORNGING AND IS CURRENTLY PASSING ALOT OF GAS. SN ASSESSED TUBE FEEDING AND PATIENT HAD IT GOING IN AT 45ML/HR AND IS DOING THE H20 FLUSHES AND FREE WATER  AS INST FROM HOSPITAL PATIENT WANT TO REGAIN SOME STRENGTH AND DECREASE IN ABD TENDERNESS TO BE ABLE TO RETURN TO OUT PATIENT THERAPY   PATIENT DOES NOT USE ASSISTIVE DEVICE FOR WALKING BUT IS WEAK AND UNSTEADY GAIT BUT DECLINED OFFER OF HOME PT AT THIS TIME. SN EDUCATION TUBE FEEDINGS, TO KEEP CAN OF COKE AROUND SO IF TUBE CLOGS SHE CAN USE WARM COKE TO UNCLOG THE TUBE. SN EDUCATION SITE CLEANING AND MANAGMENT. PATIENT CAN TOLORATED SOME FOODS AT TIMES IT COMES AND GOES SO SHE STILL EATS BY MOUTH SOME.   PATIENT MANAGES ALL HER OWN MEDICATIONS. HEr SISTER WORKS FULL TIME AND IS NOT ALWAYS HOME. SN EDUCATION ON PREVENTION OF CONSTIPATION AND NEED TO TAKE STOOL SOFTENERS AS NEEDED ONCE SHE HAS A GOOD BOWEL MOVEMENT. MEDICATIONS REVIWED ALL QUESTIONS ANSWERED PATIENT VERB UNDERSTANDING OF EDUCATION.     Wounds: FEEDING TUBE site assessed-clean dry intact  Functional Status: Minimum assistance   Pain level /10, pain is controlled.   Additional pain information: MORE TENDERNESS THAN PAIN   Home health binder and call-procedure education with patient.   Education provided on: TUBE FEEDING AND HYDRATION NEEDS, TUBE SITE CARE DAILY, SX OF COMPLICATION TO REPORT, HOW TO UNCLOG FEEDING TUBE, CONSTIPATION PREVENTION . PATIENT VERB UNDERSTANDING   Vitals see chart  Issues noted at [facility]: TUBE FEEDING/NUTRITION/HYDRATION/CONSTIPATION     Today during her assessment she said there have been  issues regarding the TF. She said one was that , although she is having bm's, she has bowel coming back through the tube and she is very bloated (which I observed), and in a lot of pain when flushing or the TF goes in. She stated she has flushed it but it hurts and comes back into the tube. She hasn't run the TF in over 2 days because of this. She has been drinking the Ensures that were donated to her but hasn;t been going through many due to not being able to afford them herself. I put her in contact with [facility] for the nutrition and transportation services since she has no transportation. She has a follow up with Chad Hott next week but fears there's nothing he will do. She has no follow up with the Dr who did the procedure so I helped her find his phone number and tol her to call his office and make an appt and let them know what's going on. I offered to make the call myself but she didn't want me calling anyone. She said she will ahve her sister take her to [facility] this evening and see if they cn do a scan and if they can't help her she will ask if they can take her to [facility] via ambulance.     Reviewed skilled need and treatment plan with Hott, Chad Taylor, APRN, CNP   Discharge Planning: WHEN GOAL MET OR NO LONGER HOMEBOUND   Plan for next visit: GENERAL ASSESSMENT, CK CONSTIPATION STATUS, PAIN STATUS, ASSESS FEED TUBE SITE AND  BE SURE she DOING FEEDING AS INST

## 2023-12-10 NOTE — Nursing Note (Signed)
 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  GI decided for scheduled percutaneous endoscopic jejunostomy insertion to stabilize body weight  Active Disciplines: SN

## 2023-12-10 NOTE — Telephone Encounter (Addendum)
 Called patient and instructed her to get ordered KUB xray. Patient agreeable to plan and will go when able to. Also discussed hycosamine treatment and patient stated she did not feel a difference but also feels that she did not have enough time taking the medication as she was hospitalized.    Norleen Birmingham, NURSE COORDINATOR        ----- Message from Tinnie JAYSON Ned, APRN, CNP sent at 12/10/2023  4:41 PM EDT -----  Regarding: RE: Manometry opinion  Great job    Orders placed for x-ray KUB given c/o constipation - please update patient, I can send in a bowel cleanse if significant stool burden is noted    I don't believe the green/brown reflux from the PEG is of concern, correct, [name]? Especially since there's no erythema/fevers/chills/purulent discharge.     Fields Oros, while you have her on the phone, please ask her if the hyoscyamine I prescribed her made any difference with her dysphagia symptoms. I sent her a MyChart message that I can see she read but didn't respond to  ----- Message -----  From: Birmingham Norleen, NURSE COORDINATOR  Sent: 12/10/2023   4:29 PM EDT  To: Tinnie JAYSON Ned, APRN, CNP  Subject: RE: Manometry opinion                            Walterine Tinnie,     This patient called back about hycosamine and reported that she was experiencing new symptoms with her PEG as well.     Patient stated that her PEG tube was having reflux that was green/brown. Patient reported that she has been having issues with constipation as well.     Patient endorses feeling bloated, Abdominal distention, loss of appetite, constipation.     Denies nausea, vomiting, redness at PEG site, erythremia at PEG site, fever, chills     Educated patient on laxative use with tube feedings, adequate fluid intake, and PEG tube maintenance. Advise to start Miralax  daily.     Do we want to get a KUB?     Norleen Birmingham, NURSE COORDINATOR  ----- Message -----  From: Fannie Domino, APRN, CNP  Sent: 12/08/2023   8:25 AM EDT  To: Tinnie JAYSON Ned, APRN,  CNP; Norleen Birmingham, NUR#  Subject: RE: Manometry opinion                            Thank You! SS  ----- Message -----  From: Ned Tinnie JAYSON, APRN, CNP  Sent: 12/08/2023   8:12 AM EDT  To: Donnice Charlie Needle, MD; Tinnie JAYSON Ned DUNCAN  Subject: RE: Manometry opinion                            Xaniyah Buchholz - please call patient. Dr. Tsosie wanted to attempt to treat her medically first. I prescribed hycosamine. Please ask her if this helped. If not, Dr. Tsosie wants to try isosorbide or a CCB like diltiazem (please assess if she has tried these in the past at an outside facility). If these are ineffective, please have Josh schedule her for an in person OV to speak with Dr. Tsosie about the POEM.     Tinnie JAYSON Ned, APRN, CNP  ----- Message -----  From: Needle Donnice Charlie, MD  Sent: 12/05/2023   4:45 PM EDT  To: Sheril Tsosie, MD; Tinnie JAYSON Ned, APRN,  CN#  Subject: RE: Manometry opinion                            Lauren / Shyam    Should Thakkar see in clinic for consideration of E-POEM? Patient interested. I just placed a J-tube for nutrition in the interim.    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 Do you think CCB's would help at all?  ----- Message -----  From: Needle Donnice Charlie, MD  Sent: 11/11/2023   3:52 PM EDT  To: Lawless Durene, MD; Tinnie JAYSON Debby, FNP-C; Sa#  Subject: Manometry opinion                                Swapna    Can you look at esophageal manometry from 05/29/23 and let me know if patient actually has Jackhammer esophagus. She's fixated on her dysphagia; she also has gastroparesis. Wants to know what POEM to get. I'm doing J-tube in the meantime b/c she's malnourished and looking cachectic.    Let  me know if patient should see Thakkar.    Thanks!  Donnice Charlie Needle, MD

## 2023-12-11 ENCOUNTER — Ambulatory Visit
Admission: RE | Admit: 2023-12-11 | Discharge: 2023-12-11 | Disposition: A | Discharge status: Home / Self Care | Source: Ambulatory Visit | Attending: PHYSICIAN ASSISTANT | Admitting: PHYSICIAN ASSISTANT

## 2023-12-11 ENCOUNTER — Other Ambulatory Visit: Payer: Self-pay

## 2023-12-11 ENCOUNTER — Telehealth (INDEPENDENT_AMBULATORY_CARE_PROVIDER_SITE_OTHER): Payer: Self-pay | Admitting: Student in an Organized Health Care Education/Training Program

## 2023-12-11 DIAGNOSIS — K5900 Constipation, unspecified: Secondary | ICD-10-CM | POA: Insufficient documentation

## 2023-12-11 NOTE — Telephone Encounter (Addendum)
 Called patient to follow up on care. Patient reports improvement with bowel movement this morning prior to x-ray. Patient agreeable to Miralax  1-2 capfuls a day. Instructed patient on care for PEG tube and maintenance of skin underneath bumper. Patient also educated on maintaining bowel movements and how to alleviate gas pain. Patient understanding of signs of infection with peg tube such as yellow or green discharge or redness and swelling at site. Patient will contact us  with anymore concerns.    Norleen Birmingham, NURSE COORDINATOR        ----- Message from Tinnie JAYSON Ned, APRN, CNP sent at 12/11/2023  2:24 PM EDT -----  Regarding: RE: Manometry opinion  Review x-ray - it doesn't look like there is a significant amount of stool present. What does she currently take everyday to help her bowels move? If she is not on anything, I would recommend Miralax  1-2 capfuls daily.     [name] including you just in case you have different recs    Tinnie JAYSON Ned, APRN, CNP  ----- Message -----  From: Birmingham Norleen, NURSE COORDINATOR  Sent: 12/11/2023   2:04 PM EDT  To: Tinnie JAYSON Ned, APRN, CNP  Subject: RE: Manometry opinion                            Just a heads up, she's getting the x-ray right now. Should be available soon!    Norleen Birmingham, NURSE COORDINATOR  ----- Message -----  From: Ned Tinnie JAYSON, APRN, CNP  Sent: 12/11/2023   8:25 AM EDT  To: Tinnie JAYSON Ned, APRN, CNP; Norleen Birmingham, NUR#  Subject: RE: Manometry opinion                            Awesome, thank you    When you speak w/ her again about the peg/x-ray, please let her know that you will call her in about 2 weeks to give the hyoscyamine some more time to work/see if it improves her dysphagia. If it doesn't we could try isosorbide or diltiazem (if she has not tried these already in the past)    Tinnie JAYSON Ned, APRN, CNP  ----- Message -----  From: Birmingham Norleen, NURSE COORDINATOR  Sent: 12/10/2023   4:59 PM EDT  To: Tinnie JAYSON Ned, APRN, CNP  Subject: RE:  Manometry opinion                            Thank you for the help.    I did ask about the medication. She stated that she did not notice huge difference but felt like with everything going on she did not have enough time with the medication for it to take effect. She also said while she was admitted the doctors changed her medications and she did not get it while she was here. She also has not done any other medication for it in the past.    Norleen Birmingham, NURSE COORDINATOR  ----- Message -----  From: Ned Tinnie JAYSON, APRN, CNP  Sent: 12/10/2023   4:45 PM EDT  To: [name] Lenis, APRN, CNP; Tinnie JAYSON Ned, APR#  Subject: RE: Manometry opinion                            Great job    Orders placed for x-ray KUB  given c/o constipation - please update patient, I can send in a bowel cleanse if significant stool burden is noted    I don't believe the green/brown reflux from the PEG is of concern, correct, [name]? Especially since there's no erythema/fevers/chills/purulent discharge.     Ninah Moccio, while you have her on the phone, please ask her if the hyoscyamine I prescribed her made any difference with her dysphagia symptoms. I sent her a MyChart message that I can see she read but didn't respond to  ----- Message -----  From: Waddell Rush, NURSE COORDINATOR  Sent: 12/10/2023   4:29 PM EDT  To: Tinnie JAYSON Ned, APRN, CNP  Subject: RE: Manometry opinion                            Walterine Tinnie,     This patient called back about hycosamine and reported that she was experiencing new symptoms with her PEG as well.     Patient stated that her PEG tube was having reflux that was green/brown. Patient reported that she has been having issues with constipation as well.     Patient endorses feeling bloated, Abdominal distention, loss of appetite, constipation.     Denies nausea, vomiting, redness at PEG site, erythremia at PEG site, fever, chills     Educated patient on laxative use with tube feedings, adequate fluid intake, and PEG tube  maintenance. Advise to start Miralax  daily.     Do we want to get a KUB?     Rush Waddell, NURSE COORDINATOR  ----- Message -----  From: Fannie Domino, APRN, CNP  Sent: 12/08/2023   8:25 AM EDT  To: Tinnie JAYSON Ned, APRN, CNP; Rush Waddell, NUR#  Subject: RE: Manometry opinion                            Thank You! SS  ----- Message -----  From: Ned Tinnie JAYSON, APRN, CNP  Sent: 12/08/2023   8:12 AM EDT  To: Donnice Charlie Needle, MD; Tinnie JAYSON Ned DUNCAN  Subject: RE: Manometry opinion                            Saulo Anthis - please call patient. Dr. Tsosie wanted to attempt to treat her medically first. I prescribed hycosamine. Please ask her if this helped. If not, Dr. Tsosie wants to try isosorbide or a CCB like diltiazem (please assess if she has tried these in the past at an outside facility). If these are ineffective, please have Josh schedule her for an in person OV to speak with Dr. Tsosie about the POEM.     Tinnie JAYSON Ned, APRN, CNP  ----- Message -----  From: Needle Donnice Charlie, MD  Sent: 12/05/2023   4:45 PM EDT  To: Sheril Tsosie, MD; Tinnie JAYSON Ned, APRN, CN#  Subject: RE: Manometry opinion                            Lauren / Sheril    Should Thakkar see in clinic for consideration of E-POEM? Patient interested. I just placed a J-tube for nutrition in the interim.    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 Do you think CCB's would help at all?  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 She's fixated on her dysphagia; she also has gastroparesis. Wants to know what POEM to get. I'm doing J-tube in the meantime b/c she's malnourished and looking cachectic.    Let me know if patient should see Thakkar.    Thanks!  Donnice Ade Bernetta, MD

## 2023-12-12 ENCOUNTER — Other Ambulatory Visit (HOSPICE_FACILITY): Payer: Self-pay

## 2023-12-12 NOTE — Care Management Notes (Signed)
 Referral Information  ++++++ Placed Provider #1 ++++++  Case Manager: Rojean Cleaves  Provider Type: Home Infusion  Provider Name: [facility], [facility] - [place], [place]  Address:  [address]  [address]  [place], [place] [address]  Contact:    Fax:   Fax:

## 2023-12-12 NOTE — Care Management Notes (Signed)
Referral Information  ++++++ Placed Provider #1 ++++++  Case Manager: Wonda Cerise  Provider Type: Home Health  Provider Name: [facility] [place]- [place], Sus[facility] Racer and [facility]  Address:  [address]  [place], [place] [address]  Contact:    Fax:   Fax:

## 2023-12-13 ENCOUNTER — Other Ambulatory Visit: Payer: Self-pay

## 2023-12-13 NOTE — Home Health (Signed)
 Admitting Diagnosis: Encounter for PEG (percutaneous endoscopic gastrostomy)   Medication administration safety reviewed with patient.   Primary Caregiver: self.   Caregiver confidence level: Quite confident.   Caregiver availability: SISTER IS LIMITED WITH AVAILABILITY     Wounds: FEEDING TUBE site assessed-clean dry intact   Functional Status: Minimum assistance   Pain level 5/10, pain generalized but mostly neck - chronic.   Home health binder and call-procedure education with patient.   Vitals see chart     Today during her assessment she said there have been issues regarding the TF but she has sine went to the bathroom and had KUB done which was normal and is feeling better than she was. No issues with brown or dark residual coming back into tube. States it is flushing fine and just flushed it before I arrived. Tube feeding hooked up and doing well. Had been off it for 2 days so she was advised by GI to start out at half her amount and slowly increase. Right now she is at 33mL and hr. She has extensive medical hx and a lot of issues. She is trying to get patient assistance for Ensure. She also has a paper saying she is to do 810 mL free water  with 105 free water  flsuhes every 4 hrs. She states there is no way for her to do that, she feels bad at 60mL flushes. I reached out to [place], Tsosie, and [place] to see what the POC is, who is to onitor her feding tube since PCP does not. What to do about the amount of water  she should be intaking, and if there is another option as far as type of feed goesif we can no longer get her the ensures. Will put CC in once I hear back from someone.

## 2023-12-15 ENCOUNTER — Other Ambulatory Visit: Payer: Self-pay

## 2023-12-15 ENCOUNTER — Other Ambulatory Visit

## 2023-12-15 ENCOUNTER — Other Ambulatory Visit (HOSPICE_FACILITY): Payer: Self-pay

## 2023-12-15 ENCOUNTER — Other Ambulatory Visit (HOSPICE_FACILITY)

## 2023-12-15 NOTE — Home Health (Signed)
 Admitting Diagnosis: Encounter for PEG (percutaneous endoscopic gastrostomy)   Medication administration safety reviewed with patient.   Primary Caregiver: self.   Caregiver confidence level: Quite confident.   Caregiver availability: SISTER IS LIMITED WITH AVAILABILITY   Wounds: FEEDING TUBE site assessed-clean dry intact   Functional Status: Minimum assistance   Pain level 4/10, pain generalized but mostly neck - chronic.   Home health binder and call-procedure education with patient.   Vitals see chart   Today during her assessment she said there have been issues regarding the TF but she has sine went to the bathroom and had KUB done which was normal and is feeling better than she was. No issues with brown or dark residual coming back into tube. States it is flushing fine, [name] present with me and was able to gravity flush fine as well. Tube feeding hooked up and doing well, no issues during our visit. Had been off it for 2 days previously so she was advised by GI to start out at half her amount and slowly increase. Right now she is at 33mL and hr. She has extensive medical hx and a lot of issues. She is trying to get patient assistance for Ensure. She also has a paper saying she is to do 810 mL free water  with 105 free water  flsuhes every 4 hrs. She states there is no way for her to do that, she feels bad at 60mL flushes. I reached out to [place], Tsosie, and [place] to see what the POC is, who is to monitor her feding tube since PCP does not. What to do about the amount of water  she should be intaking, and if there is another option as far as type of feed goesif we can no longer get her the ensures. Still no answer from Dr. Edwin or Marcela in regards to tube feeding or flushes. Pt has ENT appt tomorrow. Educated to avoid eating close to bedtime, avoid laying down flat after eating, elevate head 30-45 degrees or more.

## 2023-12-16 ENCOUNTER — Ambulatory Visit (INDEPENDENT_AMBULATORY_CARE_PROVIDER_SITE_OTHER): Payer: Self-pay | Admitting: OTOLARYNGOLOGY

## 2023-12-16 ENCOUNTER — Other Ambulatory Visit (HOSPICE_FACILITY): Payer: Self-pay

## 2023-12-16 ENCOUNTER — Ambulatory Visit (RURAL_HEALTH_CENTER): Payer: Self-pay | Admitting: NURSE PRACTITIONER

## 2023-12-16 ENCOUNTER — Other Ambulatory Visit

## 2023-12-16 ENCOUNTER — Other Ambulatory Visit (INDEPENDENT_AMBULATORY_CARE_PROVIDER_SITE_OTHER): Payer: Self-pay

## 2023-12-16 DIAGNOSIS — Z9289 Personal history of other medical treatment: Secondary | ICD-10-CM

## 2023-12-16 DIAGNOSIS — Z934 Other artificial openings of gastrointestinal tract status: Secondary | ICD-10-CM

## 2023-12-16 NOTE — Telephone Encounter (Signed)
 Holly Fritz with [employer] calling; Patient called them today for transportation. They are calling to see if this appointment is urgent or if it will be months prior to patient getting in again. RN advised unable to advise on urgency of appointment.  First available with Dr. Joshua 11/18, Holly Fritz spoke with patient and agreeable to appointment. No further questions noted

## 2023-12-18 ENCOUNTER — Other Ambulatory Visit (HOSPICE_FACILITY): Payer: Self-pay

## 2023-12-18 ENCOUNTER — Ambulatory Visit (INDEPENDENT_AMBULATORY_CARE_PROVIDER_SITE_OTHER): Payer: Self-pay | Admitting: Student in an Organized Health Care Education/Training Program

## 2023-12-18 ENCOUNTER — Other Ambulatory Visit (INDEPENDENT_AMBULATORY_CARE_PROVIDER_SITE_OTHER): Payer: Self-pay

## 2023-12-18 ENCOUNTER — Ambulatory Visit (RURAL_HEALTH_CENTER): Payer: Self-pay | Admitting: NURSE PRACTITIONER

## 2023-12-18 ENCOUNTER — Other Ambulatory Visit: Payer: Self-pay

## 2023-12-18 DIAGNOSIS — Z934 Other artificial openings of gastrointestinal tract status: Secondary | ICD-10-CM

## 2023-12-18 DIAGNOSIS — R6339 Other feeding difficulties: Secondary | ICD-10-CM

## 2023-12-18 NOTE — Telephone Encounter (Signed)
 Called [facility] to discuss patient barrier to care and triage patient.     Patient is having abdominal pain and intolerance of tube feed. Patient states that there is no resistance with the tube itself, but pain with the feed and flushes. Patient can not get rate higher than 19ml/hr. [facility] nurse stated that the patient has spent more time off tube feed than on. Patient endorses having BM's consistently and using Miralax  to help in bowel movements. Patient has a KUB for stool burden in the past. Patient is not having relief with tylenol  or OTC medications. This RN advise the nurse and patient that if there is increasing and continuing pain to seek Emergency service for fastest care.     Patient is also losing health insurance and unable to afford tube feed. Patient is living off of donated Ensures. Awaiting response on dietician order as well.     Will follow up with providers to see what can be provided from a GI standpoint.    Norleen Birmingham, NURSE COORDINATOR

## 2023-12-18 NOTE — Home Health (Signed)
 Admitting Diagnosis: Encounter for PEG (percutaneous endoscopic gastrostomy)   Medication administration safety reviewed with patient.   Primary Caregiver: self.   Caregiver confidence level: Quite confident.   Caregiver availability: SISTER IS LIMITED WITH AVAILABILITY   Wounds: FEEDING TUBE site assessed-clean dry intact   Functional Status: Minimum assistance   Pain level 5/10, pain in abdomen from tf  Home health binder and call-procedure education with patient.   Vitals see chart   Today during her assessment she said there have been issues regarding the TF and has been running it at 19ml/hr but is painful and hasn't been able to tolerate it. She is currently hooked up to it. She is not tolerating any flushes, causes pain. She is still drinking ensure and water .  No issues with brown or dark residual coming back into tube. Tube feeding hooked up and doing well, no issues during our visit.   She has extensive medical hx and a lot of issues. She is trying to get patient assistance for Ensure. She also has a paper saying she is to do 810 mL free water  with 105 free water  flsuhes every 4 hrs. She states there is no way for her to do that, she feels bad at 60mL flushes. She is still waiting to find out who is managing the TF and flushes, as are we. Also, what to do about the amount of water  she should be intaking, and if there is another option as far as type of feed goes  if we can no longer get her the ensures.  Pt has ENT appt on the 18th. Pt nausea has increased as well. She is having normal BMs currently and no more bowel coming back into tube. Educated to avoid eating close to bedtime, avoid laying down flat after eating, elevate head 30-45 degrees or more.  She got a voicemail yesterday from a dietician office in [place] so she is going to call them back to set up an appt and transportation to the appt. She said she feels it is a process of elimination to see if the issues are from the tf, the flushes,  what she's eating, or something else. She just wants to know who is managing the TF. She said if she doesn't find out wihtin the next couple weeks she will go to [facility] and ask them to remove the tube.    I called Dr Green office and gave them my number to call me with ho is managing the TF.The receptionist typed up everything I told her that Ariyah is experiencing and forwarded it to his nurse and someone will call me with who to contact about the TF.

## 2023-12-19 ENCOUNTER — Other Ambulatory Visit (HOSPICE_FACILITY)

## 2023-12-19 ENCOUNTER — Ambulatory Visit (INDEPENDENT_AMBULATORY_CARE_PROVIDER_SITE_OTHER): Payer: Self-pay | Admitting: Student in an Organized Health Care Education/Training Program

## 2023-12-19 NOTE — Telephone Encounter (Addendum)
 Called and spoke with patient about new orders placed for injection study x-ray. Patient stated that she will check with local hospital [facility] if they can complete the study. Patient case has also been sent to social workers to help with cost of care for tube feeding. Patient also has home visit with home health social worker. Patient expressed concern about loss of insurance and need for additional resources from social services. This RN expressed need to make need vocal with social services and to follow up with care and patient expressed understanding. Patient will attempt to get imaging done today and will seek emergency medical help at local ER if pain continues to worsen.    Norleen Birmingham, NURSE COORDINATOR        ----- Message from [name] Lenis, APRN, CNP sent at 12/18/2023  5:52 PM EST -----  Regarding: RE: Clinical Question  ----- Message from Tawni FALCON sent at 12/18/2023  3:07 PM EST -----  Copied From CRM #[id number].  Dr. Bernetta Spanner with [facility] ([facility] called with a clinical question. Wanting to know who maintaining pt feeding tube. Pt is having issues. Unable to tolerate 22 mL and hour and tolerate unable to evan tolerat 60mL.   The pt is having pain and nauseas.

## 2023-12-22 ENCOUNTER — Ambulatory Visit (HOSPITAL_BASED_OUTPATIENT_CLINIC_OR_DEPARTMENT_OTHER): Payer: Self-pay | Admitting: Speech-Language Pathologist

## 2023-12-22 ENCOUNTER — Encounter (INDEPENDENT_AMBULATORY_CARE_PROVIDER_SITE_OTHER): Payer: Self-pay

## 2023-12-22 DIAGNOSIS — R1312 Dysphagia, oropharyngeal phase: Secondary | ICD-10-CM

## 2023-12-23 ENCOUNTER — Encounter (INDEPENDENT_AMBULATORY_CARE_PROVIDER_SITE_OTHER): Payer: Self-pay

## 2023-12-23 ENCOUNTER — Other Ambulatory Visit (HOSPICE_FACILITY): Payer: Self-pay

## 2023-12-23 ENCOUNTER — Other Ambulatory Visit: Payer: Self-pay

## 2023-12-23 ENCOUNTER — Ambulatory Visit: Admitting: Student in an Organized Health Care Education/Training Program

## 2023-12-23 ENCOUNTER — Ambulatory Visit (INDEPENDENT_AMBULATORY_CARE_PROVIDER_SITE_OTHER): Admitting: Student in an Organized Health Care Education/Training Program

## 2023-12-23 DIAGNOSIS — R634 Abnormal weight loss: Secondary | ICD-10-CM

## 2023-12-23 DIAGNOSIS — R109 Unspecified abdominal pain: Secondary | ICD-10-CM

## 2023-12-23 DIAGNOSIS — K3184 Gastroparesis: Secondary | ICD-10-CM

## 2023-12-23 DIAGNOSIS — Z789 Other specified health status: Secondary | ICD-10-CM

## 2023-12-23 DIAGNOSIS — R1310 Dysphagia, unspecified: Secondary | ICD-10-CM

## 2023-12-23 DIAGNOSIS — Z934 Other artificial openings of gastrointestinal tract status: Secondary | ICD-10-CM

## 2023-12-23 NOTE — Progress Notes (Deleted)
 J tube 10/22   Pain in particular started right after surgery from J tube site down to her L ovary  Ovarian cysts hx  Pain is worse in the mornings, moving, coughing increases the pain  Upper back she has been having pain and chest pain  Waking up in the mornings with heart burn   Nausea has been worse  Pump will say it is blocked and she will have to flush it  105 mL - 60 mL is all she can tolerate for flushes  The social worker did sit and watch the tube feeding  She will use warm water  and this helps a lot with less blocking  Nausea and vomiting 2-3 times  Vomiting up ensure type consistency  Decreased appetite  Still full and bloated  She still tries to eat by mouth and she feel back  She has tried to eat at least once daily  She has to chose between eating by mouth and tube feedings  J tube    2 days ago she felt as though there was stool that backed up out of the tube and she was able to flush the tube. She believes it is more of the pressure that backed up.    30 mL/hour. She did cut this down to 20 mL/hr and she was able to increase it up some and tolerating it okay.     Heartburn is new - she is taking the Omeprazole  and     Levsin she tried taking this and she does not see a big difference.  2 1/2 Ensure a day is all she is able to tolerate.  She did not apply for food stamps.    She has had the tube feedings off more than she has had it on due to not being able to tolerate.    98 lbs last week since placement. She cannot tolerate anything by mouth and feeling very sick.    Hospital donated and social worker is working with [employer].     She will speak with Dietician.    [facility] [place] is local to her. Home [facility] - [facility] - local here with [place] with dietician.    She is struggling a lot.    Regular BM's - every day or every other day.    Feels as though it is harming her more than helping.    Constant pain.    Insurance - send a formal letter.

## 2023-12-23 NOTE — Progress Notes (Signed)
 MSW received message from provider regarding need for tube feed formula - patient's insurance does not cover formula. MSW reached out to patient with information regarding [employer]. Patient reports that she is also waiting to hear back from [employer] and wishes to discuss changing formulas prior to obtaining more. MSW contact information provided to patient for future formula needs.    Holly Fritz was provided with information about the following community resources:  Meal / Nutrition support      Waddell Lemmings, LGSW

## 2023-12-23 NOTE — Home Health (Signed)
 Admitting Diagnosis: Encounter for PEG (percutaneous endoscopic gastrostomy)   Medication administration safety reviewed with patient.   Primary Caregiver: self.   Caregiver confidence level: Quite confident.   Caregiver availability: SISTER IS LIMITED WITH AVAILABILITY   Wounds: FEEDING TUBE site assessed-clean dry intact   Functional Status: Minimum assistance   Pain level 5/10, pain generalized   Home health binder and call-procedure education with patient.   Vitals see chart   Assessment and VS completed. Pt had televisit with Dr. Margueritte office at [facility] and voiced all her concerns to them. They orderd CT of Abdomen which is pending insurance approval. They recommended her working with Luke Hoover, dietician at [facility] and also social worker. Pt also might reach out to her case manager in regards to diability and assistance thru [facility]. I sent Luke a message via Epic to let her know pt needed assistance with tube feeds ASAP. Patient still eating before bed which then causes heartburn and nausea and her to feel bloated and not well. Tube feed hooked up and running well during visit. No issues, pt flushing well and has been managing it fairly well. Educated on infection prevention, not eating in bed before she goes to sleep. Educated on [facility] number and to call with any issues.

## 2023-12-23 NOTE — Home Health (Signed)
 MSW consulted on this 76 YOF admitted to home health services for surgical aftercare. 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 She states that she has lost weight over the last two weeks post placement d/t tube feeds and pump not working correctly. Pt has been in contact with [employer] and multiple nurse visits have been made to try and assist. Pt further reports she has extensive mental illness. Support and empathetic listening provided t/o the majority of the visit. Assisted pt with filling [employer] nutrition request form. Pt's insurance does not cover her current tube feed formula. She does currently have a decent stock and is not worried at this time. She declines to apply for SNAP benefits or file for disability after loss of follow up. Pt is working with a child psychotherapist through the motility clinic. Encouraged her to let this MSW know of any changes or needs that could arise.

## 2023-12-23 NOTE — Home Health Plan of Care Certification Statement (Signed)
 I certify that this patient's homebound status is  Limited ambulation due to weakness; fatigues quickly with minimal exertion. and he/she needs intermittent skilled nursing services. The patient's current home health diagnosis is Nursing for medication teaching/education/adult tube feeds; Jejunostomy tube site care. The patient is under my care and I have authorized services on this plan of care and will periodically review the plan. The patient had a face-to-face encounter with an allowed provider type on 12/05/23 and the encounter was related to the primary reason for home health care. The patient is estimated to have services for 9 weeks.

## 2023-12-25 ENCOUNTER — Other Ambulatory Visit: Payer: Self-pay

## 2023-12-25 ENCOUNTER — Other Ambulatory Visit (INDEPENDENT_AMBULATORY_CARE_PROVIDER_SITE_OTHER): Payer: Self-pay

## 2023-12-25 ENCOUNTER — Ambulatory Visit
Admission: RE | Admit: 2023-12-25 | Discharge: 2023-12-25 | Disposition: A | Discharge status: Home / Self Care | Source: Ambulatory Visit

## 2023-12-25 DIAGNOSIS — Z934 Other artificial openings of gastrointestinal tract status: Secondary | ICD-10-CM

## 2023-12-25 DIAGNOSIS — R109 Unspecified abdominal pain: Secondary | ICD-10-CM | POA: Insufficient documentation

## 2023-12-25 DIAGNOSIS — Z789 Other specified health status: Secondary | ICD-10-CM

## 2023-12-25 MED ORDER — BARIUM SULFATE 2.1 % (W/V), 2.0 % (W/W) ORAL SUSPENSION
900.0000 mL | ORAL | Status: AC
Start: 2023-12-25 — End: 2023-12-25
  Administered 2023-12-25: 900 mL via ORAL

## 2023-12-25 MED ORDER — IOPAMIDOL 370 MG IODINE/ML (76 %) INTRAVENOUS SOLUTION
75.0000 mL | INTRAVENOUS | Status: AC
Start: 2023-12-25 — End: 2023-12-25
  Administered 2023-12-25: 75 mL via INTRAVENOUS

## 2023-12-25 NOTE — Progress Notes (Signed)
 GASTROENTEROLOGY/HEPATOLOGY, PHYSICIAN OFFICE CENTER  [address]  [place] [address]  Operated by [facility]  Progress Note    Name: Holly Fritz MRN:  [id number]   Date: 12/23/2023 DOB:  16-Jun-1980 (43 y.o.)     Referring Provider:    Hott, Chad Taylor, APRN, CNP  [address]  [place],  [place] [address]    Primary Care Provider:    Chad Taylor Hott, APRN, CNP    TELEMEDICINE DOCUMENTATION:    Patient Location:  MyChart video visit from home address: [address]  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 She presents for follow up after J-tube placement with Dr. Bernetta (12/03/23) with issues tolerating tube feedings and worsening abdominal pain.     Since placement she has worsening abdominal pain. Pain worsens with tube feedings. She has decreased the feeding rate as low as 20 mL/hr but she has been able to increase it to 30 mL/hr. Pain worsens in the morning with movement and coughing. Endorses worsening heartburn, as well as nausea and vomiting. Currently taking Omeprazole  once daily and Pepcid  BID. Her appetite is very poor. She has to choose between eating by mouth or running her feedings because if she will do both then she will feel miserable. She tries to eat by mouth once daily but this gets her to only get ~2.5 Ensure bottles of nutrition a day. Her feeding pump has also been giving an error message that its blocked and she will have to flush warm water . She can only tolerate 60 mL flushes every 4-6 hours.  When patient was discharged, she was given Ensure donations from the hospital and she is working with a child psychotherapist to work with [employer] for more supplies. Patient is very frustrated and upset, as she cannot tolerate Ensure but that is the only nutrition she is able to be donated currently. She feels as though having the tube placed has caused her more issues. She has the feedings turned off more times than having it on and running. She weighed 98 lbs last week (weighed 105 lbs when tube was placed).     She states that 2 days ago she felt as though there was stool that backed up out of the tube but she was able to flush the tube. She believes it was from pressure that backed up in the tube after she had eaten by mouth.     She is scheduled to meet with a Dietician in December but she is unable to wait that long. She was inquiring about referral to a Dietician at [facility]) which is more local.     She was started on Levsin  from Dr. Liddie office to hopefully help with her esophageal spasms but she has only tried taking this a couple times and she does not see a big difference.     Denies any constipation. Endorses regular BM's - every day or every other day.    She states that insurance is denying her hospital  stay and sent her a formal letter.    Past Medical History  Past Medical History:   Diagnosis Date    Autoimmune disorder (CMS HCC)     Awareness under anesthesia     Cervicocranial syndrome     Chiari I malformation (CMS HCC)     Chronic pain     Convergence insufficiency     Dysautonomia (CMS HCC)     Ehlers-Danlos syndrome     Endometriosis     Esophageal reflux     Headache     History of anesthesia complications     patient states make sure you give me enough medication to go to sleep and stay asleep or I will wake up during procedure    Neck problem     POTS (postural orthostatic tachycardia syndrome)     Scoliosis     Shortness of breath     Thyroid  nodule     Wears glasses         Past  Surgical History:   Procedure Laterality Date    ESOPHAGEAL DILATION      HX CHOLECYSTECTOMY      HX COLONOSCOPY      HX LAP CHOLECYSTECTOMY      HX MYOMECTOMY      HX WISDOM TEETH EXTRACTION      POSTERIOR FOSSA DECOMPRESSION      2017            Current Outpatient Medications   Medication Sig    albuterol  sulfate (PROVENTIL  OR VENTOLIN  OR PROAIR ) 90 mcg/actuation Inhalation oral inhaler Take 2 Puffs by inhalation Every 4 hours as needed Indications: bronchospasm prevention    [Paused] aspirin-acetaminophen -caffeine (EXCEDRIN) 250-250-65 mg Oral Tablet Take 2 Tablets by mouth Every 6 hours as needed for Migraine (Patient not taking: Reported on 12/04/2023)    capsaicin  (ZOSTRIX) 0.025 % Cream Apply topically Twice per day as needed    cetirizine  (ZYRTEC ) 10 mg Oral Tablet Take 1 Tablet by mouth Twice daily Indications: inflammation of the nose due to an allergy     clobetasoL-gauze-silicone 0.05 %- 4 X 4 Apply externally Kit Apply topically as needed Indications: skin rash     clonazePAM  (KLONOPIN ) 0.5 mg Oral Tablet Take 1 Tablet by mouth Three times a day Indications: panic disorder     cyclobenzaprine  (FLEXERIL ) 10 mg Oral Tablet Take 1 Tablet by mouth Every 8 hours as needed for Muscle spasms Indications: muscle spasm     diphenhydrAMINE  (BENADRYL ) 25 mg Oral Capsule Take 1 Capsule by mouth Once per day as needed for Itching Indications: an allergic reaction     famotidine  (PEPCID ) 40 mg Oral Tablet Take 1 Tablet (40 mg total) by mouth Twice per day as needed Indications: heartburn    hydrOXYzine  HCL (ATARAX ) 50 mg Oral Tablet Take 1 Tablet by mouth Twice daily Indications: anxious     hyoscyamine  sulfate (LEVSIN /SL) 0.125 mg Sublingual Tablet, Sublingual Place 2 Tablets (0.25 mg total) under the tongue Three times a day as needed    lamoTRIgine  (LAMICTAL ) 100 mg Oral Tablet Take 1 Tablet (100 mg total) by mouth Twice daily Indications: bipolar depression     lidocaine  (LIDODERM ) 5 % Adhesive Patch, Medicated  APPLY 1 PATCH BY TOPICAL ROUTE ONCE DAILY (MAY WEAR UP TO 12HOURS.)    lidocaine  (XYLOCAINE ) 5 % Ointment APPLY TO AFFECTED AREA 1-4 TIMES DAILY AS NEEDED    multivitamin Oral Liquid Take 5 mL by mouth Daily for 180 days Indications: treatment to prevent  vitamin deficiency (Patient not taking: Reported on 11/24/2023)    omeprazole  (PRILOSEC) 40 mg Oral Capsule, Delayed Release(E.C.) Take 1 Capsule (40 mg total) by mouth Daily 30 min before breakfast.  Ok to open capsule and take in applesauce.    ondansetron  (ZOFRAN  ODT) 4 mg Oral Tablet, Rapid Dissolve Take 1 Tab (4 mg total) by mouth Every 8 hours as needed for Nausea/Vomiting    sodium chloride  (SALINE NOSE NASL) Administer 1 Spray into each nostril Once per day as needed Indications: sinus     Tacrolimus 0.1 % Ointment Apply topically Twice per day as needed Indications: a type of allergy that causes red and itchy skin called atopic dermatitis     traZODone  (DESYREL ) 50 mg Oral Tablet Take 1 Tablet by mouth Every night as needed for Insomnia Indications: insomnia associated with depression     turmeric 400 mg Oral Capsule Take 1 Capsule (400 mg total) by mouth Daily Indications: inflammation      Allergies[1]    Family History  Family History   Problem Relation Age of Onset    Hypertension (High Blood Pressure) Mother     Primary Brain tumor Mother     No Known Problems Father     No Known Problems Sister     Congestive Heart Failure Maternal Grandmother     Non-Hodgkin's lymphoma Maternal Grandmother     No Known Problems Maternal Grandfather     COPD Paternal Grandmother     No Known Problems Paternal Grandfather     No Known Problems Daughter     No Known Problems Son     No Known Problems Maternal Aunt     No Known Problems Maternal Uncle     No Known Problems Paternal Aunt     No Known Problems Paternal Uncle     No Known Problems Other     Breast Cancer Neg Hx     Cervical Cancer Neg Hx     Colon Cancer Neg Hx     Liver Cancer Neg Hx     Lung Cancer Neg Hx      Melanoma Neg Hx     Ovarian Cancer Neg Hx     Pancreatic Cancer Neg Hx     Thyroid  Cancer Neg Hx     Uterine Cancer Neg Hx     Cancer Neg Hx     Prostate Cancer Neg Hx     Leukemia Neg Hx        Social History  Social History     Socioeconomic History    Marital status: Legally Separated     Spouse name: Not on file    Number of children: 0    Years of education: Not on file    Highest education level: Not on file   Occupational History    Occupation: disability    Occupation: [employer]   Tobacco Use    Smoking status: Every Day     Current packs/day: 0.50     Average packs/day: 0.5 packs/day for 25.9 years (12.9 ttl pk-yrs)     Types: Cigarettes     Start date: 2000     Passive exposure: Current (smoker)    Smokeless tobacco: Never    Tobacco comments:     1.5 ppd   Vaping Use    Vaping status: Never Used   Substance and Sexual Activity    Alcohol use: Not Currently    Drug use: Yes     Types:  Marijuana     Comment: daily    Sexual activity: Not on file   Other Topics Concern    Ability to Walk 1 Flight of Steps without SOB/CP Yes    Routine Exercise Not Asked    Ability to Walk 2 Flight of Steps without SOB/CP Yes    Unable to Ambulate Not Asked    Total Care No    Ability To Do Own ADL's Yes    Uses Walker No    Other Activity Level Not Asked    Uses Cane No   Social History Narrative    Respiratory history: None    Smoking history: Patient is a half a pack a day smoker since 2000. She also smokes marijuana 2-3 times a day.    Significant secondhand smoke exposure: Mother smoked    Smoking in the home: None    Occupational history: Disabled.    Occupational exposures: No exposures to chemicals, dust, pesticides, fumes or asbestos    The patient denies exposure to farms or raw sewage. Not raising wild birds. No hunting and skinning wild animals.    The patient has not been out country recently or [employer]:     [employer]: Chief Financial Officer: Forensic Psychologist: Yes    Humidifier: No     Water : City    Pets in the home: Not currently     Social Determinants of Health     Financial Resource Strain: Low Risk (12/03/2023)    Financial Resource Strain     SDOH Financial: No   Transportation Needs: High Risk (12/03/2023)    Transportation Needs     SDOH Transportation: Yes, it has kept me from medical appointments or from getting my medications   Social Connections: Low Risk (12/03/2023)    Social Connections     SDOH Social Isolation: 5 or more times a week   Intimate Partner Violence: Low Risk (05/01/2023)    Intimate Partner Violence     SDOH Domestic Violence: No   Housing Stability: Low Risk (12/03/2023)    Housing Stability     SDOH Housing Situation: I have housing.     SDOH Housing Worry: No     Current Medications[2]    Review of Systems  - Constitutional: Denies fevers. +unintentional weight loss.  - HEENT:  Denies swollen glands.  - CV:  Denies chest pain.  - Pulm.:  Denies shortness-of-breath.  - GI:   See HPI.  - GU:  Denies tea-colored urine.   - MSK:  Denies arthralgias.   - Derm: Denies skin lesions.  - Extremities: Denies swelling.  - Neurologic: Denies syncope.   - Heme: Denies easy bruising.     Physical Examination:  LMP 11/15/2023     Wt Readings from Last 2 Encounters:   12/03/23 47.7 kg (105 lb 0.8 oz)   11/11/23 46.7 kg (102 lb 15.3 oz)     General  Appears stated age. No apparent distress.  Head  NC/AT  Eyes  Conjunctiva without pallor. No scleral icterus.  Throat  Oropharynx moist & clear.  Lungs/Chest  No chest wall lesions or scars.  Non-labored breathing.  Heart  Normal rate.  Abdomen  Non-distended. No scars.  Soft & non-tender.  No hepatomegaly.  Neurology  Alert & oriented x4.  Extremities  Non-edematous.  Dermatology  No lesions or rashes.  Hematology  Good skin coloration (no paleness).    Data/Chart reviewed:  Labs: Labs reviewed from 12/06/23 - normal sodium of 139.  Imaging: I have personally reviewed and interpreted KUB images from 12/11/23, which shows no acute  findings in the abdomen and jejunostomy tube in the correct position.     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 She presents for follow up after J-tube placement with Dr. Bernetta (12/03/23) with issues tolerating tube feedings and worsening abdominal pain.       ICD-10-CM    1. Jejunostomy tube in situ (CMS HCC)  Z93.4 CT ABDOMEN PELVIS W/WO IV CONTRAST      2. Abdominal pain, unspecified abdominal location  R10.9 CT ABDOMEN PELVIS W/WO IV CONTRAST      3. Poor response to enteral nutrition  Z78.9       4. Weight loss  R63.4       5. Dysphagia, unspecified type  R13.10         Jejunostomy tube in situ  Abdominal pain  Poor response to enteral nutrition  Weight loss  -Will order a CT A/[facility] ASAP at [facility] to evaluate for mechanical J-tube complications (malposition, obstruction, kinking), abdominal wall infection, intra-abdominal process, or other causes contributing to pain and intolerance of feeds  - Continue reduced rate of continuous feeds if tolerable  - Continue to monitor J-tube site for erythema, drainage, or leakage  - Will reach out to outpatient dietician to move up patient's appointment scheduled in December, as patient is a more urgent consult for alternative formula options and caloric strategies given her status today  - Will also discuss if Dietician at [facility] will be able to manage patient's tube feedings, as this is more local to her  - Will follow up with patient pending CT results and for reassessment of feeding tolerance  - Can re-discuss G-POEM candidacy once she is able to tolerate feedings and able to stabilize total body weight    Dysphagia  - Symptoms may be related to previously documented hypercontractile esophagus  - Continue current esophageal motility  management plan of Levsin  ~30 minutes prior to eating solid food  - Recommend she continue small bites, careful chewing, and avoidance of triggering textures  - Recommend GI follow-up for consideration of alternative medical therapy if symptoms worsen (will update Dr. Liddie team on status)    Patient instructed to contact clinic for worsening abdominal pain, fever, J-tube site changes, inability to tolerate any feeds, or signs of dehydration. She was agreeable to the plan and all of her questions were answered with satisfaction.    Return if symptoms worsen or fail to improve.    [name] Lenis, APRN, CNP    This was an independent APP telemedicine visit.                [1]   Allergies  Allergen Reactions    Adhesive Rash    Doxycycline Rash     Agitation      Propranolol Hives/ Urticaria, Itching and Rash    Ciprofloxacin     Gabapentin   Other Adverse Reaction (Add comment)     Patient reports is sensitive to medication.    Levaquin [Levofloxacin]     Penicillins      unknown reaction when an infant - HAS TOLERATED KEFLEX     Tapentadol      Patient  reports sensitivity to medication.   [2]   Current Outpatient Medications:     albuterol  sulfate (PROVENTIL  OR VENTOLIN  OR PROAIR ) 90 mcg/actuation Inhalation oral inhaler, Take 2 Puffs by inhalation Every 4 hours as needed Indications: bronchospasm prevention, Disp: 3 Each, Rfl: 1    [Paused] aspirin-acetaminophen -caffeine (EXCEDRIN) 250-250-65 mg Oral Tablet, Take 2 Tablets by mouth Every 6 hours as needed for Migraine (Patient not taking: Reported on 12/04/2023), Disp: , Rfl:     capsaicin  (ZOSTRIX) 0.025 % Cream, Apply topically Twice per day as needed, Disp: , Rfl:     cetirizine  (ZYRTEC ) 10 mg Oral Tablet, Take 1 Tablet by mouth Twice daily Indications: inflammation of the nose due to an allergy , Disp: , Rfl:     clobetasoL-gauze-silicone 0.05 %- 4 X 4 Apply externally Kit, Apply topically as needed Indications: skin rash , Disp: , Rfl:     clonazePAM   (KLONOPIN ) 0.5 mg Oral Tablet, Take 1 Tablet by mouth Three times a day Indications: panic disorder , Disp: , Rfl:     cyclobenzaprine  (FLEXERIL ) 10 mg Oral Tablet, Take 1 Tablet by mouth Every 8 hours as needed for Muscle spasms Indications: muscle spasm , Disp: , Rfl:     diphenhydrAMINE  (BENADRYL ) 25 mg Oral Capsule, Take 1 Capsule by mouth Once per day as needed for Itching Indications: an allergic reaction , Disp: , Rfl:     famotidine  (PEPCID ) 40 mg Oral Tablet, Take 1 Tablet (40 mg total) by mouth Twice per day as needed Indications: heartburn, Disp: 180 Tablet, Rfl: 1    hydrOXYzine  HCL (ATARAX ) 50 mg Oral Tablet, Take 1 Tablet by mouth Twice daily Indications: anxious , Disp: , Rfl:     hyoscyamine  sulfate (LEVSIN /SL) 0.125 mg Sublingual Tablet, Sublingual, Place 2 Tablets (0.25 mg total) under the tongue Three times a day as needed, Disp: 180 Tablet, Rfl: 1    lamoTRIgine  (LAMICTAL ) 100 mg Oral Tablet, Take 1 Tablet (100 mg total) by mouth Twice daily Indications: bipolar depression , Disp: , Rfl:     lidocaine  (LIDODERM ) 5 % Adhesive Patch, Medicated, APPLY 1 PATCH BY TOPICAL ROUTE ONCE DAILY (MAY WEAR UP TO 12HOURS.), Disp: , Rfl:     lidocaine  (XYLOCAINE ) 5 % Ointment, APPLY TO AFFECTED AREA 1-4 TIMES DAILY AS NEEDED, Disp: , Rfl:     multivitamin Oral Liquid, Take 5 mL by mouth Daily for 180 days Indications: treatment to prevent vitamin deficiency (Patient not taking: Reported on 11/24/2023), Disp: 450 mL, Rfl: 1    omeprazole  (PRILOSEC) 40 mg Oral Capsule, Delayed Release(E.C.), Take 1 Capsule (40 mg total) by mouth Daily 30 min before breakfast.  Ok to open capsule and take in applesauce., Disp: 90 Capsule, Rfl: 1    ondansetron  (ZOFRAN  ODT) 4 mg Oral Tablet, Rapid Dissolve, Take 1 Tab (4 mg total) by mouth Every 8 hours as needed for Nausea/Vomiting, Disp: 12 Tab, Rfl: 0    sodium chloride  (SALINE NOSE NASL), Administer 1 Spray into each nostril Once per day as needed Indications: sinus , Disp: ,  Rfl:     Tacrolimus 0.1 % Ointment, Apply topically Twice per day as needed Indications: a type of allergy that causes red and itchy skin called atopic dermatitis , Disp: , Rfl:     traZODone  (DESYREL ) 50 mg Oral Tablet, Take 1 Tablet by mouth Every night as needed for Insomnia Indications: insomnia associated with depression , Disp: , Rfl:     turmeric 400 mg Oral Capsule, Take 1 Capsule (  400 mg total) by mouth Daily Indications: inflammation , Disp: , Rfl:   No current facility-administered medications for this visit.

## 2023-12-26 ENCOUNTER — Other Ambulatory Visit (HOSPICE_FACILITY): Payer: Self-pay

## 2023-12-26 ENCOUNTER — Other Ambulatory Visit: Payer: Self-pay

## 2023-12-29 ENCOUNTER — Encounter (INDEPENDENT_AMBULATORY_CARE_PROVIDER_SITE_OTHER): Payer: Self-pay

## 2023-12-30 ENCOUNTER — Other Ambulatory Visit (INDEPENDENT_AMBULATORY_CARE_PROVIDER_SITE_OTHER): Payer: Self-pay | Admitting: Registered"

## 2023-12-30 ENCOUNTER — Telehealth (INDEPENDENT_AMBULATORY_CARE_PROVIDER_SITE_OTHER): Payer: Self-pay | Admitting: Registered"

## 2023-12-30 ENCOUNTER — Encounter (INDEPENDENT_AMBULATORY_CARE_PROVIDER_SITE_OTHER): Payer: Self-pay | Admitting: OTOLARYNGOLOGY

## 2023-12-30 ENCOUNTER — Other Ambulatory Visit (HOSPICE_FACILITY): Payer: Self-pay

## 2023-12-30 ENCOUNTER — Other Ambulatory Visit: Payer: Self-pay

## 2023-12-30 ENCOUNTER — Ambulatory Visit (INDEPENDENT_AMBULATORY_CARE_PROVIDER_SITE_OTHER): Admitting: OTOLARYNGOLOGY

## 2023-12-30 VITALS — BP 110/70 | HR 79 | Temp 97.6°F | Ht 64.0 in | Wt 104.5 lb

## 2023-12-30 DIAGNOSIS — Q796 Ehlers-Danlos syndrome, unspecified: Secondary | ICD-10-CM

## 2023-12-30 DIAGNOSIS — Q0700 Arnold-Chiari syndrome without spina bifida or hydrocephalus: Secondary | ICD-10-CM

## 2023-12-30 DIAGNOSIS — Z934 Other artificial openings of gastrointestinal tract status: Secondary | ICD-10-CM

## 2023-12-30 DIAGNOSIS — Z8669 Personal history of other diseases of the nervous system and sense organs: Secondary | ICD-10-CM

## 2023-12-30 DIAGNOSIS — R4589 Other symptoms and signs involving emotional state: Secondary | ICD-10-CM

## 2023-12-30 DIAGNOSIS — R1319 Other dysphagia: Secondary | ICD-10-CM

## 2023-12-30 DIAGNOSIS — K1379 Other lesions of oral mucosa: Secondary | ICD-10-CM

## 2023-12-30 MED ORDER — LIDOCAINE 4% AND PHENYLEPHRINE 1% NASAL SOLUTION
5.0000 [drp] | Freq: Once | NASAL | Status: AC
Start: 2023-12-30 — End: ?

## 2023-12-30 NOTE — Procedures (Signed)
 ENT, [facility]  [address]  [place] [address]    Procedure Note    Name: [name] MRN:  [id number]   Date: 12/30/2023 DOB:  [birth year] (43 y.o.)         31575 - LARYNGOSCOPY, FLEXIBLE DIAGNOSTIC (AMB ONLY)    Performed by: Joshua Console, MD  Authorized by: Joshua Console, MD    Time Out:     Immediately before the procedure, a time out was called:  Yes    Patient verified:  Yes    Procedure Verified:  Yes    Site Verified:  Yes  Documentation:      [facility]  [place]  DEPARTMENT OF OTOLARYNGOLOGY - HEAD AND NECK SURGERY  PROCEDURE NOTE      Name: Holly Fritz, 43 y.o. female  MRN: [id number]  DOB: 04/07/1980  Date of Service: 12/30/2023     Procedure: Flexible Laryngoscopy, Diagnostic [phone])  Indications: Due to patient's symptoms, and because indirect mirror examination was inadequate, flexible fiberoptic transnasal laryngoscopy is required to visualize any possible lesions, masses or other abnormalities which may be present in the nasopharynx, hypopharynx and larynx.  Procedure:     After explaining the procedure to the patient, a flexible laryngoscope was passed into the nasal cavity, and the nasopharynx, oropharynx, hypopharynx and larynx were examined.    Findings:    Nasopharynx: Normal mucosa and no lesions of the nasopharyngeal walls.  The Eustachian tube orifices are normal.     Hypopharynx:  No lesions of the epiglottis, posterior pharyngeal walls or piriform mucosa.  There is no pooling of secretions.    Larynx:  Bilateral vocal cords are mobile but there is some slight atrophy present small glottic gap on phonation.  No pooling of secretions.  Airway patent.    The patient tolerated the procedure well.    Console Joshua, MD        Console Joshua, MD

## 2023-12-30 NOTE — H&P (Signed)
 ENT, [facility] ENT  [address] [place]  [place] [address]  [phone]    PATIENT NAME:  Holly Fritz  MRN:  [id number]  DOB:  [birth year]  DATE OF SERVICE: 12/30/2023    Chief Complaint:  Difficulty Swallowing (When she eats, food frequently tries to come back up through her nose --- happening more frequently. Also vomits a lot when eating. Recently did have a G-tube placed.)      HPI:  Holly Fritz is a 43 y.o. female presenting as a new patient for evaluation of dysphagia.  Patient has a esophageal dysphagia requiring J-tube.  They have had an esophageal manometry with normal findings of the upper esophageal sphincter.  There was also no significant cricopharyngeal bar on barium esophagram from December 19, 2022.  She was working with speech therapy and they noted some nasopharyngeal regurgitation and ultimately referred to me for further evaluation.  Patient reports that for years she has noticed that when she eats or drinks sometimes things come back up through her nose.  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  She has seen Neurology before but reports he had never seen Neurology with a question of dysphagia in mind.        Review of Information:  N/a    Past Medical History:  Past Medical History:   Diagnosis Date    Autoimmune disorder (CMS HCC)     Awareness under anesthesia     Cervicocranial syndrome     Chiari I malformation (CMS HCC)     Chronic pain     Convergence insufficiency     Dysautonomia (CMS HCC)     Ehlers-Danlos syndrome     Endometriosis     Esophageal reflux     Headache     Hearing loss 2022    History of anesthesia complications     patient states make sure you give me enough medication to go to sleep and stay asleep or I will wake up during procedure    Neck problem     POTS (postural orthostatic tachycardia syndrome)     Scoliosis     Shortness of breath     Thyroid  nodule     Wears glasses          Past Surgical  History:  Past Surgical History:   Procedure Laterality Date    ESOPHAGEAL DILATION      HX BREAST BIOPSY      HX CHOLECYSTECTOMY      HX COLONOSCOPY      HX LAP CHOLECYSTECTOMY      HX MYOMECTOMY      HX OTHER      HX WISDOM TEETH EXTRACTION      POSTERIOR FOSSA DECOMPRESSION      2017         Family History:  Family Medical History:       Problem Relation (Age of Onset)    COPD Paternal Grandmother    Congestive Heart Failure Maternal Grandmother    Hypertension (High Blood Pressure) Mother    No Known Problems Father, Sister, Maternal Grandfather, Paternal Grandfather, Daughter, Son, Maternal Aunt, Maternal Uncle, Paternal Aunt, Paternal Uncle, Other    Non-Hodgkin's lymphoma Maternal Grandmother    Primary Brain tumor Mother            Social History:  Tobacco Use History[1]  Social History     Substance and Sexual Activity   Alcohol Use Not Currently  Social History     Occupational History    Occupation: disability    Occupation: [employer]       Medications:  Outpatient Medications Marked as Taking for the 12/30/23 encounter (Office Visit) with Joshua Console, MD   Medication Sig    albuterol  sulfate (PROVENTIL  OR VENTOLIN  OR PROAIR ) 90 mcg/actuation Inhalation oral inhaler Take 2 Puffs by inhalation Every 4 hours as needed Indications: bronchospasm prevention    [Paused] aspirin-acetaminophen -caffeine (EXCEDRIN) 250-250-65 mg Oral Tablet Take 2 Tablets by mouth Every 6 hours as needed for Migraine    capsaicin  (ZOSTRIX) 0.025 % Cream Apply topically Twice per day as needed    cetirizine  (ZYRTEC ) 10 mg Oral Tablet Take 1 Tablet by mouth Twice daily Indications: inflammation of the nose due to an allergy     clobetasoL-gauze-silicone 0.05 %- 4 X 4 Apply externally Kit Apply topically as needed Indications: skin rash     clonazePAM (KLONOPIN) 0.5 mg Oral Tablet Take 1 Tablet by mouth Three times a day Indications: panic disorder     cyclobenzaprine (FLEXERIL) 10 mg Oral Tablet Take 1 Tablet by mouth Every  8 hours as needed for Muscle spasms Indications: muscle spasm     diphenhydrAMINE  (BENADRYL ) 25 mg Oral Capsule Take 1 Capsule by mouth Once per day as needed for Itching Indications: an allergic reaction     famotidine  (PEPCID ) 40 mg Oral Tablet Take 1 Tablet (40 mg total) by mouth Twice per day as needed Indications: heartburn    hydrOXYzine  HCL (ATARAX ) 50 mg Oral Tablet Take 1 Tablet by mouth Twice daily Indications: anxious     hyoscyamine sulfate (LEVSIN/SL) 0.125 mg Sublingual Tablet, Sublingual Place 2 Tablets (0.25 mg total) under the tongue Three times a day as needed    lamoTRIgine (LAMICTAL) 100 mg Oral Tablet Take 1 Tablet (100 mg total) by mouth Twice daily Indications: bipolar depression     lidocaine  (LIDODERM ) 5 % Adhesive Patch, Medicated APPLY 1 PATCH BY TOPICAL ROUTE ONCE DAILY (MAY WEAR UP TO 12HOURS.)    lidocaine  (XYLOCAINE ) 5 % Ointment APPLY TO AFFECTED AREA 1-4 TIMES DAILY AS NEEDED    multivitamin Oral Liquid Take 5 mL by mouth Daily for 180 days Indications: treatment to prevent vitamin deficiency    omeprazole  (PRILOSEC) 40 mg Oral Capsule, Delayed Release(E.C.) Take 1 Capsule (40 mg total) by mouth Daily 30 min before breakfast.  Ok to open capsule and take in applesauce.    ondansetron  (ZOFRAN  ODT) 4 mg Oral Tablet, Rapid Dissolve Take 1 Tab (4 mg total) by mouth Every 8 hours as needed for Nausea/Vomiting    sodium chloride  (SALINE NOSE NASL) Administer 1 Spray into each nostril Once per day as needed Indications: sinus     Tacrolimus 0.1 % Ointment Apply topically Twice per day as needed Indications: a type of allergy that causes red and itchy skin called atopic dermatitis     traZODone  (DESYREL ) 50 mg Oral Tablet Take 1 Tablet by mouth Every night as needed for Insomnia Indications: insomnia associated with depression     turmeric 400 mg Oral Capsule Take 1 Capsule (400 mg total) by mouth Daily Indications: inflammation        Allergies:  Allergies[2]    Review of Systems:  Do you  have any fevers: no   Any weight change: yes   Change in your vision: yes    Chest Pain: no   Shortness of Breath: no   Stomach pain: yes   Urinary difficulity:  no   Joint Pain: yes   Skin Problems: yes   Weakness or Numbness: yes   Easy Bruising or Bleeding: yes   Excessive Thirst: no   Seasonal Allergies: yes    Pertinent ROS per HPI.   Denies fever/chills, SOB, and chest pain.   All other systems reviewed and found to be negative.    Physical Exam:  Blood pressure 110/70, pulse 79, temperature 36.4 C (97.6 F), height 1.626 m (5' 4), weight 47.4 kg (104 lb 8 oz), SpO2 96%, not currently breastfeeding.  Body mass index is 17.94 kg/m.  General Appearance: Pleasant, cooperative, healthy, and in no acute distress.  Eyes: Conjunctivae/corneas clear, pupils equal and round, EOM's intact.  Head and Face: Normocephalic, atraumatic.  Face symmetric, no obvious lesions.   Ears:     Pinnae: Normal shape and position.   Nose: External pyramid midline. Septum midline. Inferior turbinates normal. Mucosa normal. No purulence, polyps, or crusts.   Oral Cavity/Oropharynx: No mucosal lesions, masses, or pharyngeal asymmetry.  Tongue and soft palate with some slightly decreased tone on exam.  Hypopharynx/Larynx: Voice normal. Indirect mirror laryngoscopy inadequate for exam and/or treatment purposes, please see procedure note for flexible laryngoscopy.   Neck: no cervical adenopathy, no palpable thyroid  or salivary gland masses  Thyroid : no significant thyroid  abnormality by palpation  Cardiovascular: Good perfusion of upper extremities.  No cyanosis of the hands or fingers.  Lungs: No apparent stridorous breathing. No acute distress.  Skin: Skin warm and dry.  Neurologic: Cranial nerves:  grossly intact.  Psychiatric: Alert and oriented x 3.    Procedure:  ENT, [facility] ENT  [address]  [place] [address]    Procedure Note    Name: Floriene Pavlovich MRN:  [id number]   Date: 12/30/2023 DOB:  17-Jan-1981  (43 y.o.)         31575 - LARYNGOSCOPY, FLEXIBLE DIAGNOSTIC (AMB ONLY)    Performed by: Joshua Console, MD  Authorized by: Joshua Console, MD    Time Out:     Immediately before the procedure, a time out was called:  Yes    Patient verified:  Yes    Procedure Verified:  Yes    Site Verified:  Yes  Documentation:      [place]  [place]  DEPARTMENT OF OTOLARYNGOLOGY - HEAD AND NECK SURGERY  PROCEDURE NOTE      Name: Abbigal Pracht, 43 y.o. female  MRN: [id number]  DOB: Sep 06, 1980  Date of Service: 12/30/2023     Procedure: Flexible Laryngoscopy, Diagnostic (68424)  Indications: Due to patient's symptoms, and because indirect mirror examination was inadequate, flexible fiberoptic transnasal laryngoscopy is required to visualize any possible lesions, masses or other abnormalities which may be present in the nasopharynx, hypopharynx and larynx.  Procedure:     After explaining the procedure to the patient, a flexible laryngoscope was passed into the nasal cavity, and the nasopharynx, oropharynx, hypopharynx and larynx were examined.    Findings:    Nasopharynx: Normal mucosa and no lesions of the nasopharyngeal walls.  The Eustachian tube orifices are normal.     Hypopharynx:  No lesions of the epiglottis, posterior pharyngeal walls or piriform mucosa.  There is no pooling of secretions.    Larynx:  Bilateral vocal cords are mobile but there is some slight atrophy present small glottic gap on phonation.  No pooling of secretions.  Airway patent.    The patient tolerated the procedure well.    Console Joshua, MD  Honora Molt, MD    Assessment:  Shaunie Sasaki is a 43 y.o. female presenting as a new patient for evaluation of dysphagia.  Patient has a esophageal dysphagia requiring J-tube.  They have had an esophageal manometry with normal findings of the upper esophageal sphincter.  There was also no significant cricopharyngeal bar on barium esophagram from December 19, 2022.  She was working with speech  therapy and they noted some nasopharyngeal regurgitation and ultimately referred to me for further evaluation.  Patient reports that for years she has noticed that when she eats or drinks sometimes things come back up through her nose.  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  She has seen Neurology before but reports he had never seen Neurology with a question of dysphagia in mind.    In general seems to have some slightly low tone of the tongue and palate on exam.  However she has symmetric mobility bilaterally.  She does have some slight vocal cord weakness which could be consistent with a vocal cord paresis but bilateral vocal cords are mobile.      ICD-10-CM    1. Jejunostomy tube in situ (CMS HCC)  Z93.4 31575 - LARYNGOSCOPY, FLEXIBLE DIAGNOSTIC (AMB ONLY)      2. Esophageal dysphagia  R13.19 Refer to [facility] Neurology     [phone] - LARYNGOSCOPY, FLEXIBLE DIAGNOSTIC (AMB ONLY)      3. 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 Ehlers-Danlos syndrome  Q79.60 Refer to [facility] Neurology     [phone] - LARYNGOSCOPY, FLEXIBLE DIAGNOSTIC (AMB ONLY)      5. History of Chiari malformation  Z86.69 Refer to [facility] Neurology     [phone] - LARYNGOSCOPY, FLEXIBLE DIAGNOSTIC (AMB ONLY)           Plan:  Orders Placed This Encounter    31575 - LARYNGOSCOPY, FLEXIBLE DIAGNOSTIC (AMB ONLY)    Refer to [facility] Neurology    lidocaine  4 % + phenylephrine  1 % (1:1) nasal solution       Overall I recommend she continue to work with speech therapy for swallow and also recommend a thorough neurologic evaluation further opinion on what appears to be some tone issues with swallowing.  Follow up with me after.    Honora Molt, MD  Department of Otolaryngology  [facility] of Medicine    PCP: Chad Taylor Hott, APRN, CNP  [address]  [facility] [place] [address]   REF: No referring provider defined for this encounter.     Honora Molt, MD                 [1]   Social History  Tobacco Use   Smoking Status Every Day    Current packs/day: 0.50    Average packs/day: 0.5 packs/day for 25.9 years (12.9 ttl pk-yrs)    Types: Cigarettes    Start date: 2000    Passive exposure: Current (smoker)   Smokeless Tobacco Never   Tobacco Comments    1.5 ppd   [2]   Allergies  Allergen Reactions    Adhesive Rash    Doxycycline Rash     Agitation      Propranolol Hives/ Urticaria, Itching and Rash    Ciprofloxacin     Gabapentin   Other Adverse Reaction (Add comment)     Patient reports is sensitive to  medication.    Levaquin [Levofloxacin]     Penicillins      unknown reaction when an infant - HAS TOLERATED KEFLEX     Tapentadol      Patient reports sensitivity to medication.

## 2023-12-30 NOTE — Telephone Encounter (Signed)
 Nutrition Services    Referral received and approved by insurance. Spoke to patient on phone 12/29/2023. She is very tearful stating she cannot afford her tube feeding formula. She is not tolerating her current Ensure, losing weight, and wanting to discontinue all feedings since it makes very nauseated and bloated. I have referred to our [facility] workers to see if there is any type of program available to help her with purchasing supplies. Recommendations for formula change will be placed in her notes after calculated.     Luke Hoover CDM, CFPP, NDTR, RDN, LDN   Medicine, [facility] Clinical Dietitian

## 2023-12-31 ENCOUNTER — Other Ambulatory Visit: Payer: Self-pay

## 2023-12-31 ENCOUNTER — Other Ambulatory Visit (HOSPICE_FACILITY)

## 2023-12-31 ENCOUNTER — Encounter (HOSPITAL_COMMUNITY): Payer: Self-pay

## 2023-12-31 NOTE — Home Health (Signed)
 Admitting Diagnosis: Encounter for PEG (percutaneous endoscopic gastrostomy)   Medication administration safety reviewed with patient.   Primary Caregiver: self.   Caregiver confidence level: Quite confident.   Caregiver availability: SISTER IS LIMITED WITH AVAILABILITY   Wounds: FEEDING TUBE site assessed-clean dry intact   Functional Status: Minimum assistance   Pain level 5/10, pain generalized   Home health binder and call-procedure education with patient.   Vitals see chart   Assessment and VS completed. 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 Tube placement looks good otherwise. Patient states she knows hysterectomy is only option and is considering making appt with Dr Elouise here in [place]. She is waiting to hear back from Mallie Hoover about some options financially for other TF. Pt also might reach out to her case manager in regards to diability and assistance thru [facility]. Leeroy from Preventive [employer] stated she might be able to get TF covered if Dr sends a letter for medical necessity to [facility] regarding the Ensure or whichever TF they decide on. patient is aware and is interested in doing that. Patient was sitting outside smoking when I got here. She said she hasn't been hooked up for a couple days just due to annoyance of the flushes and the TF discomfort. She said she'd hook it back lup later today.She said she ate cereal last night and a brownie today. Her ENT dr appt yesterday went not as planned, as she states he referred her to a Neurologist due to her muscle issues and he sees nothing wrong from his end. She is seeing a Insurance Account Manager in January now.  Educated on infection prevention, not eating in bed before she goes to sleep. Educated on [facility] number and to call with any issues. She checked out the [facility] site for donations of Ensure but the catch is she has to pay shippng anf cannot afford that. So  she thinks between [employer], Preventive med and a possible physician's letter to [facility] she could get some help soon.She's willing to try one more different supplement and if she feels discomfort with that she will have the tube removed.

## 2024-01-01 ENCOUNTER — Telehealth (INDEPENDENT_AMBULATORY_CARE_PROVIDER_SITE_OTHER): Payer: Self-pay | Admitting: Student in an Organized Health Care Education/Training Program

## 2024-01-01 ENCOUNTER — Telehealth (INDEPENDENT_AMBULATORY_CARE_PROVIDER_SITE_OTHER): Payer: Self-pay | Admitting: Registered"

## 2024-01-01 ENCOUNTER — Other Ambulatory Visit (INDEPENDENT_AMBULATORY_CARE_PROVIDER_SITE_OTHER): Payer: Self-pay

## 2024-01-01 DIAGNOSIS — Z934 Other artificial openings of gastrointestinal tract status: Secondary | ICD-10-CM

## 2024-01-01 NOTE — Telephone Encounter (Signed)
 Nutrition Services    Spoke to Tarshia and she stated she has been intolerant of her current tube feeding. She is using Ensure via continuous feedings and is experiencing gastric nausea, heartburn, and bloating. I have consulted with her and calculated new feedings for continuous and bolus with Vital 1.5. Below are my recommendations.     Bolus:   Full strength Vital 1.5  every 4 hrs  Flush with 30ml H20 before and after each feeding  Additional H20 flush of 100ml every 6 hrs.    Continuous:  Via Pump  40 ml per hour  Flush 150 ml H20 every 4 hrs    Bolus slowly  Remain upright after feedings  Please provide education    Needs:    Feed Provides:  1440 kcal/day at 30kcal/kg/wt  1440 kcal  67 gm Protein at 1.4gm/kg/wt      65 gm Pro  1440 ml H20 at 30ml/kg/wt  1636 ml H20    Feel free to contact me with any questions  Luke Hoover CDM, CFPP, NDTR, RDN, LDN  [facility] Medicine, [facility] Clinical Dietitian  [phone]

## 2024-01-02 ENCOUNTER — Other Ambulatory Visit: Payer: Self-pay

## 2024-01-02 ENCOUNTER — Other Ambulatory Visit (HOSPICE_FACILITY): Payer: Self-pay

## 2024-01-02 ENCOUNTER — Other Ambulatory Visit (INDEPENDENT_AMBULATORY_CARE_PROVIDER_SITE_OTHER): Payer: Self-pay | Admitting: Family

## 2024-01-02 NOTE — Home Health (Signed)
 Admitting Diagnosis: Encounter for PEG (percutaneous endoscopic gastrostomy)   Medication administration safety reviewed with patient.   Primary Caregiver: self.   Caregiver confidence level: Quite confident.   Caregiver availability: SISTER IS LIMITED WITH AVAILABILITY   Wounds: FEEDING TUBE site assessed-clean dry intact   Functional Status: Minimum assistance   Pain level 5/10, pain abdomen  Home health binder and call-procedure education with patient.   Vitals-see chart   Labs-none  Assessment and VS completed.   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 Holly Fritz is waiting until everything with TF is being managed by herself before she goes to Gynecology.Patient states she knows hysterectomy is only option and is considering making appt with Holly Holly Fritz here in [place].  Tube placement looks good otherwise.    She is waiting to hear back from Holly Fritz about some options financially for other TF.She has an appt with her on Monday at 10am. Pt also might reach out to her case manager in regards to diability and assistance thru Holly Fritz is waiting for her divorce to be done first she says.   Holly Fritz from Holly Fritz stated she might be able to get TF covered if Holly sends a letter for medical necessity to Medicaid regarding the Ensure or whichever TF they decide on. patient is aware and is interested in doing that.-message sent to Holly Fritz to do that if Vital 1.5 isn't covered.   SN arrived for visit today. patinet in living room, hooked to TF, tolerating it okay today. She said she ate cereal last night and a brownie today.  ENT referred her to a Neurologist due to her esophageal muscle issues and because he sees nothing wrong from his end. That Neurologist appt is in January now.  Educated on infection prevention, not eating in bed before she goes to sleep. Educated on Holly Fritz number and to call with any issues. She  checked out the Holly Fritz site for donations of Ensure but the catch is she has to pay shippng anf cannot afford that. So she thinks between Holly Fritz, Holly med, and a possible physician's letter to Holly Fritz she could get some help soon with TF cost. She's willing to try one more different supplement and if she feels discomfort with that she will have the tube removed.

## 2024-01-05 ENCOUNTER — Encounter (INDEPENDENT_AMBULATORY_CARE_PROVIDER_SITE_OTHER): Payer: Self-pay | Admitting: Registered"

## 2024-01-05 ENCOUNTER — Other Ambulatory Visit: Payer: Self-pay

## 2024-01-05 ENCOUNTER — Ambulatory Visit: Attending: Registered" | Admitting: Registered"

## 2024-01-05 DIAGNOSIS — Z713 Dietary counseling and surveillance: Secondary | ICD-10-CM | POA: Insufficient documentation

## 2024-01-05 NOTE — Progress Notes (Signed)
 Nutrition Counseling Completed    Patient's Name: Holly Fritz  Medical Record #: [id number]  Medical Dx:Tube Feeding Recommendations  Provider Referral/Signature: [name] Lenis  RDN Name: Suzen Fritz CDM, CFPP, NDTR, RDN, LDN  Date of Service: 01/05/2024  Time In 10:00am Time Out 10:30am Total Units 2  MNT CPT Code(s) [phone]  Individual: yes Group: no    Weight 105(48kg)(last recorded weight), IBW 91%, BMI 18.6- normal weight range. Patient's caloric needs are 1440 kcal/day at 30kcal/kg/wt.     Consulting today for Tube feeding recommendations.     We discussed in length her current intolerance to Ensure. Patient has several medical issues. Spoke to Holly Fritz and she stated she has been intolerant of her current tube feeding. She is using Ensure via continuous feedings and is experiencing gastric nausea, heartburn, and bloating. I have consulted with her and calculated new feedings for continuous and bolus with Vital 1.5.  Recommendations given for bolus and continuous feeds.  Home health is to educate patient on bolus feeds.  She asked about eating food.  She is currently seeing a SLP which would give her better recommendations.  Below are my recommendations.      Bolus:   Full strength Vital 1.5  every 4 hrs  Flush with 30ml H20 before and after each feeding  Additional H20 flush of 100ml every 6 hrs.     Continuous:  Via Pump  40 ml per hour  Flush 150 ml H20 every 4 hrs     Bolus slowly  Remain upright after feedings  Please provide education     Needs:                                                Feed Provides:  1440 kcal/day at 30kcal/kg/wt             1440 kcal  67 gm Protein at 1.4gm/kg/wt                 65 gm Pro  1440 ml H20 at 81ml/kg/wt                 1636 ml H20    Patient has good knowledge of her symptoms and was open to suggestions.     Adherence Potential is good.     Goal is to gain weight at at healthy rate and to be able to tolerate tube feedings. Literature reviewed and given to  Holly Fritz.    Thank you for the opportunity to consult this patient. Please feel free to contact me for further assistance.    Holly Fritz   Clinical Dietitian  CDM, CFPP, NDTR, RDN, LDN

## 2024-01-06 ENCOUNTER — Other Ambulatory Visit (HOSPICE_FACILITY): Payer: Self-pay

## 2024-01-06 ENCOUNTER — Other Ambulatory Visit: Payer: Self-pay

## 2024-01-06 NOTE — Home Health (Signed)
 Holly Fritz is a 43 year old female admitted to routine home health services for education on medicaiton management and disease process teaching. Benefit period 12/07/2023-02/04/2024. Patient is homebound due to needing assistance of at least one other person to leave the home.    Orientation status: grossly normal in conversation Weight: 96 lbs, patient stated that she weighed herself this morning     Recent Labs: none    Vitals: see Vitals tab Pain level 3/10, pain is in the abdomen and is cramping this morning, but is not intolerable.    ADL Dependence: 6/6: Bathing, Dressing, Toileting, Transfers, Ambulation, Feeding    Activity Level: Up as tolerated    Diet type: Tube feedings, paients formula was recently changed from Ensure to Vital 1.5, she is currently waiting on approval from her GI doctor to write the rx for the feeding. Pt in contact with [employer] patient assistance. Discussed patients recent appointment with dietician. Also educated patient on how to perform bolus feedings as reccomended to her by her dietician, she verbalized understanding. Will plan for follow up at next visit and have the paitent demonstrate    Wounds: Feeding tube site. Assessed, no abnormality noted at this time    Supplies: Kangaroo pump    Copy of medications in binder: No, discharge summary in binder Medication reconciliation completed. Allergies reviewed.  Home Health services reviewed, medical equipment and supplies ordered, and call-procedure education reviewed with patient and family. All questions answered to patient/family satisfaction. Encouraged to contact home health office with questions or concerns. Reviewed office hours and after hours call procedure.

## 2024-01-07 ENCOUNTER — Telehealth (HOSPITAL_COMMUNITY): Payer: Self-pay | Admitting: "Technician

## 2024-01-07 NOTE — Telephone Encounter (Signed)
 This call in reference to referral from Luke Hoover to [employer] for financial assistance with nutrition. Melis states she is waiting for divorce to be final before applying for disability. She states she is working with the dietician for nutrient change for affordability. She does not have any emergent needs.    Suggested she enroll with [employer] program. She plans to call for enrollment and request available items of interest. Leeroy Punches CHW [facility] Preventive Medicine

## 2024-01-09 ENCOUNTER — Other Ambulatory Visit (HOSPICE_FACILITY): Payer: Self-pay

## 2024-01-09 ENCOUNTER — Other Ambulatory Visit (HOSPICE_FACILITY)

## 2024-01-09 NOTE — Home Health (Signed)
 Admitting Diagnosis: Encounter for PEG (percutaneous endoscopic gastrostomy)   Wounds: FEEDING TUBE site assessed-clean dry intact, no dressing in place per pt preference   Pain level 3/10, pain abdominal r/t tube feeds  Vitals: see VS tab  SN arrived for routine visit. Assessment and VS completed. Pt still has not gotten Vital 1.5 feed. [facility] will not send in and referred to PCP. PCP is out today. Another message sent to [facility] providers to see if they will send in Vital 1.5 RX and letter of medical necessity to [facility] [phone]). She has been working with dietician at [facility], [place], and was set up with new feed schedule. She is not able to tolerate it yet, is trying to work up to that. Vital 1.5  Bolus: 160mL every 4 hours, flush with 30mL H20 before and after. Additional H20 fush of 100mL every 6 hrs. Continuous: Via pump, 40mL per hr, flush H20 every 4 hours. Needs: 1440kcal/daily, 67 gm protein, H20. Educated on bolus feeds and how to do. Was able to do 2 flushes during visit. Is still trying to eat by mouth also After 2 flushes of 50mL she states she felt bloated and tight in her abdomen. No dressing around her peg tube, open to air. No drainage note. Edcuated on logging how much she is tolerating over how many mintues and how she is feeling as well as flush amount she is able to tolerate.

## 2024-01-12 ENCOUNTER — Telehealth (INDEPENDENT_AMBULATORY_CARE_PROVIDER_SITE_OTHER): Payer: Self-pay | Admitting: Student in an Organized Health Care Education/Training Program

## 2024-01-14 ENCOUNTER — Other Ambulatory Visit: Payer: Self-pay

## 2024-01-14 ENCOUNTER — Other Ambulatory Visit (HOSPICE_FACILITY): Payer: Self-pay

## 2024-01-14 NOTE — Home Health (Signed)
 Admitting Diagnosis: Encounter for PEG (percutaneous endoscopic gastrostomy)   Wounds: FEEDING TUBE site assessed-clean dry intact, no dressing in place per pt preference   Pain level 3/10, pain abdominal r/t tube feeds   Vitals: see VS tab   SN arrived for routine visit. Assessment and VS completed. I called [employer] and did not get ahold of anyone at this time. Patietn states the area where the tube is attached is more loose this last week and a new pain at the site. She said if it continues to hurt she will get an Xray to check placement.She is going to apply for SNAP benefits to try to cover certain nutrition supplements at [facility] and [employer].   Pt still has not gotten Vital 1.5 feed. [place] will not send in and referred to PCP. PCP is out today. Another message sent to [facility] providers to see if they will send in Vital 1.5 RX and letter of medical necessity to [facility] [phone]). She has been working with dietician at [facility], [place], and was set up with new feed schedule. She is not able to tolerate it yet, is trying to work up to that. Vital 1.5 Bolus: 160mL every 4 hours, flush with 30mL H20 before and after. Additional H20 fush of 100mL every 6 hrs. Continuous: Via pump, 40mL per hr, flush H20 every 4 hours. Needs: 1440kcal/daily, 67 gm protein, H20. Educated on bolus feeds and how to do.   Was able to do 1 flush during visit. Is still trying to eat by mouth also She stated she hasn't been hooked up to feeding in 2 days, ate out at a restaurant yesterday and tolerated it well. No dressing around her peg tube, open to air. No drainage noted. Edcuated on logging how much she is tolerating over how many mintues and how she is feeling as well as flush amount she is able to tolerate.

## 2024-01-15 ENCOUNTER — Encounter (INDEPENDENT_AMBULATORY_CARE_PROVIDER_SITE_OTHER): Payer: Self-pay | Admitting: Student in an Organized Health Care Education/Training Program

## 2024-01-15 ENCOUNTER — Ambulatory Visit (INDEPENDENT_AMBULATORY_CARE_PROVIDER_SITE_OTHER): Payer: Self-pay | Admitting: Student in an Organized Health Care Education/Training Program

## 2024-01-15 NOTE — Telephone Encounter (Addendum)
 Called patient home health nurse [facility]. Destiny was inquiring about status of DME order for Vital 1.5. Tube feed has been sent to [facility] on 12/1 and are awaiting response. Also discussed peg tube placement and patient concerns. All questions answered at this time. Will continue to follow up with patient in MyChart messages with further questions and concerns.    Norleen Birmingham, NURSE COORDINATOR        Regarding: Clinical Question  ----- Message from Jearld MATSU sent at 01/15/2024 10:56 AM EST -----  Copied From CRM [id number].[facility] Nurse) called with a clinical question. Asking for [name] Lenis NP to please call her at [phone]. Please advise. Thank you!

## 2024-01-16 ENCOUNTER — Other Ambulatory Visit

## 2024-01-16 NOTE — Case Communication (Signed)
 Have been on the phone over the past week with [facility], PCP, and [facility] de[facility] regarding pts tube feed. PCP office will not send RX for Vital 1.5 tube feed formula or write medical letter of necessity due to not having all info or being the MD that inserted GTube. I called back out to [facility] and spoke to a Norleen Birmingham and he assured me RX was sent. I calle [facility] again this morning and [facility] states last RX they have was 10/24 for [facility]. I reached back out to Norleen and gave him the fax number to refax Vital 1.5 RX to them. Will need to follow up with them as patient is to be d/c next week from [facility] services.

## 2024-01-21 ENCOUNTER — Ambulatory Visit

## 2024-01-21 ENCOUNTER — Ambulatory Visit (INDEPENDENT_AMBULATORY_CARE_PROVIDER_SITE_OTHER)

## 2024-01-21 ENCOUNTER — Encounter (INDEPENDENT_AMBULATORY_CARE_PROVIDER_SITE_OTHER): Payer: Self-pay

## 2024-01-21 ENCOUNTER — Other Ambulatory Visit: Payer: Self-pay

## 2024-01-21 ENCOUNTER — Other Ambulatory Visit (HOSPICE_FACILITY): Payer: Self-pay

## 2024-01-21 VITALS — BP 120/82 | HR 80 | Temp 98.4°F | Resp 16 | Wt 104.0 lb

## 2024-01-21 DIAGNOSIS — R102 Pelvic and perineal pain unspecified side: Secondary | ICD-10-CM | POA: Insufficient documentation

## 2024-01-21 DIAGNOSIS — R1907 Generalized intra-abdominal and pelvic swelling, mass and lump: Secondary | ICD-10-CM | POA: Insufficient documentation

## 2024-01-21 MED ORDER — METOCLOPRAMIDE 5 MG TABLET: 5.0000 mg | ORAL_TABLET | Freq: Three times a day (TID) | ORAL | 1 refills | Status: DC

## 2024-01-21 NOTE — H&P (Signed)
 UROGYNECOLOGY, [address]  [address]  [place] [address]  Operated by [facility]  History and Physical    Name: Holly Fritz MRN:  [id number]   Date: 01/21/2024 DOB:  08/03/1980 (43 y.o.)           HPI Comments:    History of Present Illness  Holly Fritz is a 43 year old female with recurrent uterine fibroids, ovarian cysts, and mixed urinary incontinence who presents for evaluation of worsening pelvic pain and increasing fibroid burden.    She has a longstanding history of uterine fibroids and ovarian cysts, with prior laparoscopic removal of a 10 cm fibroid and multiple cysts in 2010. Over the past decade, she has experienced recurrent fibroid growth and multiple ovarian cysts. Imaging in 2020 revealed a 5 cm fibroid and bilateral ovarian cysts (right up to 2 cm, left up to 4 cm, and peripheral cervical cysts). Repeat imaging in 2023 showed a 7 cm fibroid with persistent cysts, and most recently, CT scan on December 25, 2023 demonstrated multiple large fibroids, the largest measuring 8 cm. She is concerned about the rapid increase in fibroid size and number over the past two years. She previously tried Lupron in 2019 without benefit and has not tolerated birth control. She describes a retroflexed, prolapsed uterus. Menstrual cycles have become irregular over the past 6-8 months, with intervals ranging from two months to three weeks and variable flow from heavy to light.    She underwent placement of a jejunal feeding tube on December 03, 2023 for severe gastroparesis and ongoing weight loss. Since tube placement, she has experienced increased pelvic pain radiating to the left ovary, which she attributes to limited intra-abdominal space. She is able to eat small amounts by mouth but frequently experiences choking, vomiting of undigested food, abdominal distension, and pain. Gastroparesis has progressively worsened over the past two years. She  has not tried Reglan  due to pharmacogenomic testing indicating serotonin sensitivity. Gastric emptying study showed mild delayed emptying for solids (emptying time 137 minutes).    She experiences significant mixed urinary incontinence, with episodes of leakage during coughing, sneezing, physical activity, and post-void dribbling when standing up. She uses panty liners when going out and has had accidents in public. She also reports pelvic pain, urinary urgency, and frequency. ICIQ urinary incontinence scores are elevated (short form 7, total score 19, symptom score 13, bother score 6).    She reports recent onset of irregular menses, vasomotor symptoms, sleep disturbances, irritability, physical and mental exhaustion, bladder problems, and severe complaints of depression, anxiety, vaginal dryness, and joint/muscular pain.       Chief Complaint   Patient presents with    New Patient     Patient states that she just recently found out the she has multiple large fibroids with the largest measuring up to 8cm. Patient states that she had endometriosis that has been treated 2-3 times. Patient states that she is also having problems with her feeding tube placement she states that since having placed she feels like her ovaries are on fire.         Past Medical History:   Diagnosis Date    Autoimmune disorder (CMS HCC)     Awareness under anesthesia     Cervicocranial syndrome     Chiari I malformation (CMS HCC)     Chronic pain     Convergence insufficiency     Dysautonomia (CMS HCC)     Ehlers-Danlos syndrome  Endometriosis     Esophageal reflux     Headache     Hearing loss 2022    History of anesthesia complications     patient states make sure you give me enough medication to go to sleep and stay asleep or I will wake up during procedure    Neck problem     POTS (postural orthostatic tachycardia syndrome)     Scoliosis     Shortness of breath     Thyroid  nodule     Wears glasses             Past Surgical History:    Procedure Laterality Date    ESOPHAGEAL DILATION      HX BREAST BIOPSY      HX CHOLECYSTECTOMY      HX COLONOSCOPY      HX LAP CHOLECYSTECTOMY      HX MYOMECTOMY      HX OTHER      HX WISDOM TEETH EXTRACTION      POSTERIOR FOSSA DECOMPRESSION      2017            Social History[1]     Current Outpatient Medications   Medication Sig    albuterol  sulfate (PROVENTIL  OR VENTOLIN  OR PROAIR ) 90 mcg/actuation Inhalation oral inhaler Take 2 Puffs by inhalation Every 4 hours as needed Indications: bronchospasm prevention    capsaicin  (ZOSTRIX) 0.025 % Cream Apply topically Twice per day as needed    cetirizine  (ZYRTEC ) 10 mg Oral Tablet Take 1 Tablet by mouth Twice daily Indications: inflammation of the nose due to an allergy     clobetasoL-gauze-silicone 0.05 %- 4 X 4 Apply externally Kit Apply topically as needed Indications: skin rash     clonazePAM  (KLONOPIN ) 0.5 mg Oral Tablet Take 1 Tablet by mouth Three times a day Indications: panic disorder     cyclobenzaprine  (FLEXERIL ) 10 mg Oral Tablet Take 1 Tablet by mouth Every 8 hours as needed for Muscle spasms Indications: muscle spasm     diphenhydrAMINE  (BENADRYL ) 25 mg Oral Capsule Take 1 Capsule by mouth Once per day as needed for Itching Indications: an allergic reaction     famotidine  (PEPCID ) 40 mg Oral Tablet Take 1 tablet by mouth twice daily as needed    hydrOXYzine  HCL (ATARAX ) 50 mg Oral Tablet Take 1 Tablet by mouth Twice daily Indications: anxious     hyoscyamine  sulfate (LEVSIN /SL) 0.125 mg Sublingual Tablet, Sublingual Place 2 Tablets (0.25 mg total) under the tongue Three times a day as needed    lamoTRIgine  (LAMICTAL ) 100 mg Oral Tablet Take 1 Tablet (100 mg total) by mouth Twice daily Indications: bipolar depression     lidocaine  (LIDODERM ) 5 % Adhesive Patch, Medicated APPLY 1 PATCH BY TOPICAL ROUTE ONCE DAILY (MAY WEAR UP TO 12HOURS.)    lidocaine  (XYLOCAINE ) 5 % Ointment APPLY TO AFFECTED AREA 1-4 TIMES DAILY AS NEEDED    metoclopramide  HCl  (REGLAN ) 5 mg Oral Tablet Take 1 Tablet (5 mg total) by mouth Three times daily before meals Indications: gastroesophageal reflux disease, stomach muscle paralysis and decreased function from diabetes    multivitamin Oral Liquid Take 5 mL by mouth Daily for 180 days Indications: treatment to prevent vitamin deficiency    omeprazole  (PRILOSEC) 40 mg Oral Capsule, Delayed Release(E.C.) Take 1 Capsule (40 mg total) by mouth Daily 30 min before breakfast.  Ok to open capsule and take in applesauce.    ondansetron  (ZOFRAN  ODT) 4 mg Oral Tablet, Rapid Dissolve  Take 1 Tab (4 mg total) by mouth Every 8 hours as needed for Nausea/Vomiting    sodium chloride  (SALINE NOSE NASL) Administer 1 Spray into each nostril Once per day as needed Indications: sinus     Tacrolimus 0.1 % Ointment Apply topically Twice per day as needed Indications: a type of allergy that causes red and itchy skin called atopic dermatitis     traZODone  (DESYREL ) 50 mg Oral Tablet Take 1 Tablet by mouth Every night as needed for Insomnia Indications: insomnia associated with depression     turmeric 400 mg Oral Capsule Take 1 Capsule (400 mg total) by mouth Daily Indications: inflammation        Allergies[2]    Family Medical History:       Problem Relation (Age of Onset)    COPD Paternal Grandmother    Congestive Heart Failure Maternal Grandmother    Hypertension (High Blood Pressure) Mother    No Known Problems Father, Sister, Maternal Grandfather, Paternal Grandfather, Daughter, Son, Maternal Aunt, Maternal Uncle, Paternal Aunt, Paternal Uncle, Other    Non-Hodgkin's lymphoma Maternal Grandmother    Primary Brain tumor Mother              OB History   Gravida Para Term Preterm AB Living   0 0 0      SAB IAB Ectopic Multiple Live Births               Objective:  Vitals:    01/21/24 1423   BP: 120/82   Pulse: 80   Resp: 16   Temp: 36.9 C (98.4 F)   TempSrc: Tympanic   SpO2: 95%   Weight: 47.2 kg (104 lb)        Review of systems: negative unless otherwise  stated in HPI    Results for orders placed or performed during the hospital encounter of 12/03/23 (from the past 12 weeks)   HCG, SERUM QUALITATIVE, PREGNANCY    Collection Time: 12/03/23  9:05 AM   Result Value Ref Range    PREGNANCY, SERUM QUALITATIVE Negative Negative    Narrative    Sensitivity of the qualitative pregnancy test is 20 mIU hCG/mL.  If pregnancy is strongly considered, perform quantitative hCG or repeat in 48 hours.   BASIC METABOLIC PANEL    Collection Time: 12/03/23  9:05 AM   Result Value Ref Range    SODIUM 141 136 - 145 mmol/L    POTASSIUM 4.0 3.7 - 5.3 mmol/L    CHLORIDE 110 96 - 111 mmol/L    CO2 TOTAL 23 22 - 30 mmol/L    ANION GAP 8 4 - 13 mmol/L    CALCIUM 8.5 (L) 8.6 - 10.2 mg/dL    GLUCOSE 95 65 - 874 mg/dL    BUN 10 8 - 25 mg/dL    CREATININE 9.13 9.39 - 1.05 mg/dL    eGFRcr - FEMALE 86 >=39 mL/min/1.61m2    BUN/CREA RATIO 12 6 - 22   MAGNESIUM    Collection Time: 12/03/23  9:05 AM   Result Value Ref Range    MAGNESIUM 1.9 1.8 - 2.6 mg/dL   BASIC METABOLIC PANEL    Collection Time: 12/04/23  4:08 AM   Result Value Ref Range    SODIUM 142 136 - 145 mmol/L    POTASSIUM 3.8 3.5 - 5.1 mmol/L    CHLORIDE 110 96 - 111 mmol/L    CO2 TOTAL 25 22 - 30 mmol/L    ANION GAP 7 4 -  13 mmol/L    CALCIUM 8.4 (L) 8.6 - 10.2 mg/dL    GLUCOSE 97 65 - 874 mg/dL    BUN 8 8 - 25 mg/dL    CREATININE 9.29 9.39 - 1.05 mg/dL    eGFRcr - FEMALE >09 >=60 mL/min/1.13m2    BUN/CREA RATIO 11 6 - 22   PHOSPHORUS    Collection Time: 12/04/23  4:08 AM   Result Value Ref Range    PHOSPHORUS 3.5 2.4 - 4.7 mg/dL   MAGNESIUM    Collection Time: 12/04/23  4:08 AM   Result Value Ref Range    MAGNESIUM 1.9 1.8 - 2.6 mg/dL   BASIC METABOLIC PANEL    Collection Time: 12/05/23  4:50 AM   Result Value Ref Range    SODIUM 138 136 - 145 mmol/L    POTASSIUM 4.0 3.5 - 5.1 mmol/L    CHLORIDE 107 96 - 111 mmol/L    CO2 TOTAL 25 22 - 30 mmol/L    ANION GAP 6 4 - 13 mmol/L    CALCIUM 9.0 8.6 - 10.2 mg/dL    GLUCOSE 898 65 - 874 mg/dL     BUN 7 (L) 8 - 25 mg/dL    CREATININE 9.29 9.39 - 1.05 mg/dL    eGFRcr - FEMALE >09 >=60 mL/min/1.68m2    BUN/CREA RATIO 10 6 - 22   PHOSPHORUS    Collection Time: 12/05/23  4:50 AM   Result Value Ref Range    PHOSPHORUS 4.1 2.4 - 4.7 mg/dL   MAGNESIUM    Collection Time: 12/05/23  4:50 AM   Result Value Ref Range    MAGNESIUM 1.9 1.8 - 2.6 mg/dL   BASIC METABOLIC PANEL    Collection Time: 12/06/23  5:13 AM   Result Value Ref Range    SODIUM 139 136 - 145 mmol/L    POTASSIUM 3.5 3.5 - 5.1 mmol/L    CHLORIDE 104 96 - 111 mmol/L    CO2 TOTAL 30 22 - 30 mmol/L    ANION GAP 5 4 - 13 mmol/L    CALCIUM 9.4 8.6 - 10.2 mg/dL    GLUCOSE 898 65 - 874 mg/dL    BUN 9 8 - 25 mg/dL    CREATININE 9.27 9.39 - 1.05 mg/dL    eGFRcr - FEMALE >09 >=60 mL/min/1.67m2    BUN/CREA RATIO 13 6 - 22   PHOSPHORUS    Collection Time: 12/06/23  5:13 AM   Result Value Ref Range    PHOSPHORUS 3.9 2.4 - 4.7 mg/dL   MAGNESIUM    Collection Time: 12/06/23  5:13 AM   Result Value Ref Range    MAGNESIUM 2.0 1.8 - 2.6 mg/dL     Results  Radiology  Abdomen and pelvis CT (12/25/2023): J-tube positioned in jejunum, no contrast extravasation, uterine enlargement with multiple fibroids up to 8 cm, small volume pelvic fluid, post-cholecystectomy changes    Diagnostic  Gastric emptying study: Mildly delayed gastric emptying for solids, emptying time 137 minutes       Neck:  No obvious thyromegaly, no adenopathy trachea midline.    Chest:  Lungs clear, no wheezing, rales, normal symmetric air entry, no tachypnea, retractions or cyanosis.    Heart:  Heart rate regular and rhythmical    Sternum: Non tender    Ribcage   Right: Non tender   Left: Non tender    Abdomen:  Soft, normal bowel sounds, no mass, no obvious evidence of herniation no hepatosplenomegaly.  No  evidence of fluid percussion wave.  J-tube in Situ.    Back:  No significant evidence of paraspinal tenderness, thoracic lumbar areas normal to palpation.    Lower Extremity:  No significant edema,  no varicosities, pulses palpable    Pelvic Examination   Vulva:  No evidence of suspicious lesions no evidence of condyloma and/or vaginal intraepithelial neoplasia   Urethra:  No evidence of urethral diverticulum, no evidence of urethral caruncle and/or urethral carcinoma.   Bladder neck:  Hypermobility   Mild.  Reduction stress test performed results:  Not enough urine into adequately performed such   Bladder:  Catheterization performed    Vaginal epithelium:  Slightly atrophic    Cervix:  Visualized distorted secondary to pelvic mass    Uterus:  Probable fibroid involvement posterior wall.  Mass tender.  Assessment & Plan  Uterine fibroids  She has recurrent, symptomatic uterine fibroids with rapid growth, the largest being 8 cm. Previous treatments included laparoscopic myomectomy and polypectomy. Hormonal therapies like leuprolide and oral contraceptives are intolerable. Significant pelvic pain and pressure are likely worsened by limited intra-abdominal space due to a J-tube and weight loss. Uterine prolapse and perimenopausal symptoms complicate management. A hysterectomy is likely the only definitive solution, but access is restricted by her current location and insurance. Recent CT scan shows multiple large fibroids, the largest at 8 cm. Prior surgical and medical management, including intolerance to hormonal therapies, was discussed. Perimenopausal symptoms and her overlap with current complaints were assessed.    Mixed urinary incontinence  She experiences longstanding mixed urinary incontinence with stress and urge components. Symptoms are multifactorial, related to fibroids, and perimenopausal status. ICIQ scores confirm a significant symptom burden. The relationship between pelvic organ prolapse, fibroids, and incontinence symptoms was discussed.     Orders Placed This Encounter    BLADDER CATHETERIZATION ORDERABLE (AMB ONLY-PD)    MRI Pelvis W/WO    URINALYSIS, MACROSCOPIC AND MICROSCOPIC W/CULTURE  REFLEX    metoclopramide  HCl (REGLAN ) 5 mg Oral Tablet        ICD-10-CM    1. Pelvic pain  R10.20 URINALYSIS, MACROSCOPIC AND MICROSCOPIC W/CULTURE REFLEX     BLADDER CATHETERIZATION ORDERABLE (AMB ONLY-PD)      2. Generalized abdominal or pelvic swelling or mass or lump  R19.07 MRI Pelvis W/WO           I want to thank you very much for allowing me to participate in the care of Holly Fritz if you have any questions regarding same please do not hesitate to contact me.     Pending MRI and confirmation of fibroid would recommend hysteroscopy for tissue sampling to rule out the possibility of carcinoma although this is unlikely with the growth being about 1 cm a year.  On examination of the pelvic mass is tender.  Recommend Reglan  therapy also as an empiric trial for gastroparesis.  Would also need hysteroscopy D&C to complete the workup and potential cystoscopy      Aleene Barber, DO  01/21/2024, 14:16    On the day of the encounter, a total of   60 minutes was spent on this patient encounter including review of historical information, examination, documentation and post-visit activities.      This note was created with assistance from [employer] via capture of conversational audio. Consent was obtained from the patient and all parties present prior to recording.                [1]   Social History  Tobacco Use    Smoking status: Every Day     Current packs/day: 0.50     Average packs/day: 0.5 packs/day for 25.9 years (13.0 ttl pk-yrs)     Types: Cigarettes     Start date: 2000     Passive exposure: Current (smoker)    Smokeless tobacco: Never    Tobacco comments:     1.5 ppd   Vaping Use    Vaping status: Never Used   Substance Use Topics    Alcohol use: Not Currently    Drug use: Yes     Types: Marijuana     Comment: daily   [2]   Allergies  Allergen Reactions    Adhesive Rash    Doxycycline Rash     Agitation      Propranolol Hives/ Urticaria, Itching and Rash    Ciprofloxacin     Gabapentin   Other Adverse  Reaction (Add comment)     Patient reports is sensitive to medication.    Levaquin [Levofloxacin]     Penicillins      unknown reaction when an infant - HAS TOLERATED KEFLEX     Tapentadol      Patient reports sensitivity to medication.

## 2024-01-21 NOTE — Home Health (Signed)
 Admitting Diagnosis: Encounter for PEG (percutaneous endoscopic gastrostomy)     Wounds: FEEDING TUBE site assessed-clean dry intact, no dressing in place per pt preference     Pain level 0/10    Vitals: see VS tab     SN arrived for discharge visit. Assessment and VS completed.     Pt still has not gotten Vital 1.5 feed, but script has been sent and she is awaiting delivery from [facility]. She has been working with dietician at [facility], [place], and was set up with new feed schedule. She is still not able to tolerate it yet, is trying to work up to that. Vital 1.5 Bolus: 160mL every 4 hours, flush with 30mL H20 before and after. Additional H20 fush of 100mL every 6 hrs. Continuous: Via pump, 40mL per hr, flush H20 every 4 hours. Needs: 1440kcal/daily, 67 gm protein, H20. Was educated at previous appointment on how to perform bolus feeds. Today patient performed bolus feed and flush appropriately but unable to do entire feed. She was able to tolerate about 90ml of feed plus flushes. Stated she may need to do smaller feeding more often and will discuss this with provid    Is still trying to eat by mouth also Stated she has found that if she eats food, she is unable to tolerate feed but she is trying to do both. . 2 x 2 split gauze dressing around her peg tube, clean dry and intact. Edcuated on logging how much she is tolerating over how many mintues and how she is feeling as well as flush amount she is able to tolerate. Verbalized understanding.

## 2024-01-21 NOTE — Procedures (Signed)
 UROGYNECOLOGY, [address]  [address]  [place] [address]  Operated by [facility]  Procedure Note    Name: Holly Fritz MRN:  [id number]   Date: 01/21/2024 DOB:  05-23-1980 (43 y.o.)         BLADDER CATHETERIZATION ORDERABLE (AMB ONLY-PD)    Performed by: Elouise Mungo, DO  Authorized by: Elouise Mungo, DO    Procedure discussed: discussed risks, benefits and alternatives    Chaperone present: yes    Timeout: timeout called immediately prior to procedure      Procedure Details    Procedure: catheter insertion      Position: dorsal lithotomy    Catheter insertion: non-indwelling (straight)      Inserted by: clinician    Catheter type: coud      Catheter size: 12 Fr    Catheter provided by: health care organization      Complexity: simple      Number of initial attempts: 1    Urethral dilation: no      Urine characteristics: yellow    Leg bag attached: no      Post-Procedure Details     Outcome: patient tolerated procedure well with no complications      Post-procedure interventions: post-procedure education provided      Disposition: discharged home in satisfactory condition            Mungo Elouise, DO

## 2024-01-22 LAB — URINALYSIS, MICROSCOPIC

## 2024-01-22 LAB — URINALYSIS, MACROSCOPIC
BILIRUBIN: NEGATIVE mg/dL
BLOOD: NEGATIVE mg/dL
GLUCOSE: NEGATIVE mg/dL
KETONES: NEGATIVE mg/dL
LEUKOCYTES: NEGATIVE WBCs/uL
NITRITE: NEGATIVE
PH: 7.5 (ref 5.0–8.0)
PROTEIN: NEGATIVE mg/dL
SPECIFIC GRAVITY: 1.015 (ref 1.005–1.030)
UROBILINOGEN: 0.2 mg/dL (ref 0.2–1.0)

## 2024-01-23 ENCOUNTER — Telehealth (INDEPENDENT_AMBULATORY_CARE_PROVIDER_SITE_OTHER): Payer: Self-pay | Admitting: Student in an Organized Health Care Education/Training Program

## 2024-01-23 NOTE — Telephone Encounter (Addendum)
 Called patient about hycosamine and triaged patient for intolerance of all tube feeds.    Patient stated that her home health was discharged and has been taught how to do bolus feeds. Patient only tolerates 90ml of fluid over 30 minutes at a time. Patient had been tolerating night time feeds with the pump, but has had a history of not tolerating tube feeds. Still waiting for approval from [employer] for vital 1.5. Confirmed that they have all information on our end that was needed.    Patient has not attempted tube feeding out of fear of pain and discomfort. States that the J tube site is clean, dry, and intact with no signs of infection like swelling, redness, or drainage    Patient reports distended abdomen that worsens with feeds or flushes, pain in upper abdomen that radiates to the back/shoulder area, nausea, dry heaving, intermittent diarrhea (on first bolus feed), pressure in J-tube that expels gastric contents upon opening, weight loss    Patient denies vomiting, constipation (2-4 solid BM a day) fevers, chills    Patient and I had a 30 minute conversation over the phone discussing how to handle abdominal pain, gas pain, gas relief, proper technique with J tube feedings, tube feed intolerance, medications.    Norleen Birmingham, NURSE COORDINATOR      ----- Message from Augustin SQUIBB sent at 01/23/2024  9:30 AM EST -----  Regarding: RE: Manometry opinion  Geniya Fulgham Please call and talk to the patient today in regards to : Can you call the patient specifically about the hyoscyamine /if she is taking it as directed and if it has improved her dysphagia? Thank you  ----- Message -----  From: Debby Tinnie BROCKS, APRN, CNP  Sent: 01/22/2024   7:52 AM EST  To: Augustin JONETTA Cohn  Subject: FW: Manometry opinion                              ----- Message -----  From: Debby Tinnie BROCKS, APRN, CNP  Sent: 01/15/2024   1:41 PM EST  To: Tinnie BROCKS Debby, APRN, CNP; Norleen Birmingham, NUR#  Subject: RE: Manometry opinion                             Can you call the patient specifically about the hyoscyamine /if she is taking it as directed and if it has improved her dysphagia?  ----- Message -----  From: Birmingham Norleen, NURSE COORDINATOR  Sent: 01/15/2024  12:41 PM EST  To: Tinnie BROCKS Debby, APRN, CNP  Subject: RE: Manometry opinion                            Called patient home health nurse with other concerns. Asked home health nurse but she had no current information on hycosamine. Patient had sent in MyChart message and sent her a message also asking about the hycosamine. Charting to follow.    Norleen Birmingham, NURSE COORDINATOR  ----- Message -----  From: Debby Tinnie BROCKS, APRN, CNP  Sent: 01/15/2024   9:19 AM EST  To: Tinnie BROCKS Debby, APRN, CNP; Norleen Birmingham, NUR#  Subject: RE: Manometry opinion                            Did you touch base with this patient? See below message  ----- Message -----  From: Debby Tinnie BROCKS, APRN, CNP  Sent: 01/02/2024  12:37 PM EST  To: Tinnie BROCKS Debby, APRN, CNP; Norleen Birmingham, NUR#  Subject: RE: Manometry opinion                            Can you touch base with this patient again regarding the hyoscyamine ? (Is she taking it before meals, has it helped at all with her dysphagia)    Tinnie BROCKS Debby, APRN, CNP  ----- Message -----  From: Birmingham Norleen, NURSE COORDINATOR  Sent: 12/11/2023   4:08 PM EST  To: [name] Lenis, APRN, CNP; Tinnie BROCKS Debby, APR#  Subject: RE: Manometry opinion                            Called patient to follow up on care. Patient reports improvement with bowel movement this morning prior to x-ray. Patient agreeable to Miralax  1-2 capfuls a day. Instructed patient on care for PEG tube and maintenance of skin underneath bumper. Patient also educated on maintaining bowel movements and how to alleviate gas pain. Patient understanding of signs of infection with peg tube such as yellow or green discharge or redness and swelling at site. Patient will contact us  with anymore concerns.    Norleen Birmingham, NURSE  COORDINATOR  ----- Message -----  From: Debby Tinnie BROCKS, APRN, CNP  Sent: 12/11/2023   2:25 PM EDT  To: [name] Lenis, APRN, CNP; Tinnie BROCKS Debby, APR#  Subject: RE: Manometry opinion                            Review x-ray - it doesn't look like there is a significant amount of stool present. What does she currently take everyday to help her bowels move? If she is not on anything, I would recommend Miralax  1-2 capfuls daily.     [name] including you just in case you have different recs    Tinnie BROCKS Debby, APRN, CNP  ----- Message -----  From: Birmingham Norleen, NURSE COORDINATOR  Sent: 12/11/2023   2:04 PM EDT  To: Tinnie BROCKS Debby, APRN, CNP  Subject: RE: Manometry opinion                            Just a heads up, she's getting the x-ray right now. Should be available soon!    Norleen Birmingham, NURSE COORDINATOR  ----- Message -----  From: Debby Tinnie BROCKS, APRN, CNP  Sent: 12/11/2023   8:25 AM EDT  To: Tinnie BROCKS Debby, APRN, CNP; Norleen Birmingham, NUR#  Subject: RE: Manometry opinion                            Awesome, thank you    When you speak w/ her again about the peg/x-ray, please let her know that you will call her in about 2 weeks to give the hyoscyamine  some more time to work/see if it improves her dysphagia. If it doesn't we could try isosorbide or diltiazem (if she has not tried these already in the past)    Tinnie BROCKS Debby, APRN, CNP  ----- Message -----  From: Birmingham Norleen, NURSE COORDINATOR  Sent: 12/10/2023   4:59 PM EDT  To: Tinnie BROCKS Debby, APRN, CNP  Subject: RE: Manometry opinion  Thank you for the help.    I did ask about the medication. She stated that she did not notice huge difference but felt like with everything going on she did not have enough time with the medication for it to take effect. She also said while she was admitted the doctors changed her medications and she did not get it while she was here. She also has not done any other medication for it in the past.    Norleen Birmingham, NURSE COORDINATOR  ----- Message -----  From: Debby Tinnie BROCKS, APRN, CNP  Sent: 12/10/2023   4:45 PM EDT  To: [name] Lenis, APRN, CNP; Tinnie BROCKS Debby, APR#  Subject: RE: Manometry opinion                            Great job    Orders placed for x-ray KUB given c/o constipation - please update patient, I can send in a bowel cleanse if significant stool burden is noted    I don't believe the green/brown reflux from the PEG is of concern, correct, [name]? Especially since there's no erythema/fevers/chills/purulent discharge.     Amelita Risinger, while you have her on the phone, please ask her if the hyoscyamine  I prescribed her made any difference with her dysphagia symptoms. I sent her a MyChart message that I can see she read but didn't respond to  ----- Message -----  From: Birmingham Norleen, NURSE COORDINATOR  Sent: 12/10/2023   4:29 PM EDT  To: Tinnie BROCKS Debby, APRN, CNP  Subject: RE: Manometry opinion                            Walterine Tinnie,     This patient called back about hycosamine and reported that she was experiencing new symptoms with her PEG as well.     Patient stated that her PEG tube was having reflux that was green/brown. Patient reported that she has been having issues with constipation as well.     Patient endorses feeling bloated, Abdominal distention, loss of appetite, constipation.     Denies nausea, vomiting, redness at PEG site, erythremia at PEG site, fever, chills     Educated patient on laxative use with tube feedings, adequate fluid intake, and PEG tube maintenance. Advise to start Miralax  daily.     Do we want to get a KUB?     Norleen Birmingham, NURSE COORDINATOR  ----- Message -----  From: Fannie Domino, APRN, CNP  Sent: 12/08/2023   8:25 AM EDT  To: Tinnie BROCKS Debby, APRN, CNP; Norleen Birmingham, NUR#  Subject: RE: Manometry opinion                            Thank You! SS  ----- Message -----  From: Debby Tinnie BROCKS, APRN, CNP  Sent: 12/08/2023   8:12 AM EDT  To: Donnice Charlie Needle, MD; Tinnie BROCKS Debby DUNCAN  Subject: RE: Manometry opinion                            Deniese Oberry - please call patient. Dr. Tsosie wanted to attempt to treat her medically first. I prescribed hycosamine. Please ask her if this helped. If not, Dr. Tsosie wants to try isosorbide or a CCB like diltiazem (please assess if she has tried these in the past  at an outside facility). If these are ineffective, please have Josh schedule her for an in person OV to speak with Dr. Tsosie about the POEM.     Tinnie JAYSON Ned, APRN, CNP  ----- Message -----  From: Bernetta Donnice Ade, MD  Sent: 12/05/2023   4:45 PM EDT  To: Sheril Tsosie, MD; Tinnie JAYSON Ned, APRN, CN#  Subject: RE: Manometry opinion                            Lauren / Sheril    Should Thakkar see in clinic for consideration of E-POEM? Patient interested. I just placed a J-tube for nutrition in the interim.    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 Do you think CCB's would help at all?  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 She's fixated on her dysphagia; she also has gastroparesis. Wants to know what POEM to get. I'm doing J-tube in the meantime b/c she's malnourished and looking cachectic.    Let me know if patient should see Thakkar.    Thanks!  Donnice Ade Bernetta, MD

## 2024-01-27 ENCOUNTER — Ambulatory Visit (INDEPENDENT_AMBULATORY_CARE_PROVIDER_SITE_OTHER): Payer: Self-pay | Admitting: DIETICIAN-REGISTERED

## 2024-01-27 ENCOUNTER — Telehealth (HOSPITAL_COMMUNITY): Payer: Self-pay | Admitting: "Technician

## 2024-01-27 NOTE — Telephone Encounter (Signed)
 Holly Fritz did receive [employer] benefit information by mail but has not had time to review and enroll for available benefits. She voices being overwhelmed with current health issues for her tube feedings. She plans to review insurance benefit information soon. This call dropped. Attempt to recall but had to leave voicemail requesting return call if assistance is needed. Leeroy Punches CHW [facility] Preventive Medicine    MyChart message sent EB

## 2024-01-28 ENCOUNTER — Other Ambulatory Visit (HOSPICE_FACILITY): Payer: Self-pay

## 2024-02-04 ENCOUNTER — Non-Acute Institutional Stay (HOSPICE_FACILITY): Payer: Self-pay

## 2024-02-11 ENCOUNTER — Ambulatory Visit (INDEPENDENT_AMBULATORY_CARE_PROVIDER_SITE_OTHER): Payer: Self-pay | Admitting: Student in an Organized Health Care Education/Training Program

## 2024-02-13 ENCOUNTER — Encounter (INDEPENDENT_AMBULATORY_CARE_PROVIDER_SITE_OTHER): Payer: Self-pay | Admitting: Student in an Organized Health Care Education/Training Program

## 2024-02-13 ENCOUNTER — Telehealth (HOSPITAL_COMMUNITY): Payer: Self-pay | Admitting: "Technician

## 2024-02-13 NOTE — Telephone Encounter (Addendum)
 Called and spoke with Rexene with case management at [employer]. Discussed the patient's frustrations and needs of care. Patient has been unable to tolerate tube feeds despite multiple discussions about tube care. Encouraged care management to encouraged patient to continuing seeing PCP and dietician as they can help manage nutrition and ongoing medical issues.    Norleen Birmingham, NURSE COORDINATOR        Regarding: Clinical Question  ----- Message from [facility] B sent at 02/10/2024  3:10 PM EST -----  Copied From CRM [id number].Dr Bernetta,     Rexene Case Manager with [employer] () called with a clinical question. Pt had j tube placed by provider and is having issues. She spoke to pt on Christmas Eve and pt expressed that she is having issues and would like for the office to reach out to pt. Thank you!

## 2024-02-13 NOTE — Telephone Encounter (Signed)
 Holly Fritz states that nothing has changed communication wise since home health ended. She has not heard from the dietician or Gastroenterologist about her concerns. She is unable to  flush the feeding tube as educated with every 4 hours, only succeeding in flushing 20ml. She states she continues to lose weight with current weight at 96 pounds. She tries eating food but states half of what she eats comes back up.    She is in contact with [facility] nurse who has  sent messages to Holly Fritz and  the gastro team on 02/06/24.  She states she has been informed by the insurance company they will not cover the cost of any formula for her feedings.    In her frustration, she has sent messages through [employer] concerning the lack of communication and follow-up with the feeding tube. She asked for information regarding its removal. This message was sent today, 02/13/2024.    She has an appointment with her PCP on 02/19/2024. Advised to write down all her concerns and request for communication assistance.    She has an MRI of the abdomen the week of 02/23/2024.    She discusses she surfs the web and FaceBook looking for answers and checking medication side effects.    Advised to contact friends and family which help her stay positive. Holly Fritz requested areas that may need volunteers, not hospital or nursing homes due to her chronic conditions. Suggested [employer] and [employer] [facility] [employer]. Phone numbers given for contact.    Encourage to call Preventive Medicine if she feels communication over her concerns are not handled better after speaking with her PCP. Leeroy Punches CHW [facility] Preventive Medicine

## 2024-02-15 ENCOUNTER — Encounter (HOSPITAL_COMMUNITY): Payer: Self-pay

## 2024-02-15 ENCOUNTER — Emergency Department
Admission: EM | Admit: 2024-02-15 | Discharge: 2024-02-15 | Disposition: A | Discharge status: Other Acute Inpatient Hospital | Attending: Student in an Organized Health Care Education/Training Program | Admitting: Student in an Organized Health Care Education/Training Program

## 2024-02-15 ENCOUNTER — Emergency Department (HOSPITAL_COMMUNITY)

## 2024-02-15 ENCOUNTER — Other Ambulatory Visit: Payer: Self-pay

## 2024-02-15 ENCOUNTER — Observation Stay: Admission: EM | Admit: 2024-02-15 | Discharge: 2024-02-19 | Disposition: A | Discharge status: Home / Self Care

## 2024-02-15 DIAGNOSIS — K9419 Other complications of enterostomy: Secondary | ICD-10-CM

## 2024-02-15 DIAGNOSIS — Q796 Ehlers-Danlos syndrome, unspecified: Secondary | ICD-10-CM | POA: Insufficient documentation

## 2024-02-15 DIAGNOSIS — K3184 Gastroparesis: Secondary | ICD-10-CM | POA: Insufficient documentation

## 2024-02-15 DIAGNOSIS — Y731 Therapeutic (nonsurgical) and rehabilitative gastroenterology and urology devices associated with adverse incidents: Secondary | ICD-10-CM | POA: Insufficient documentation

## 2024-02-15 DIAGNOSIS — F4323 Adjustment disorder with mixed anxiety and depressed mood: Secondary | ICD-10-CM | POA: Insufficient documentation

## 2024-02-15 DIAGNOSIS — L02818 Cutaneous abscess of other sites: Secondary | ICD-10-CM

## 2024-02-15 DIAGNOSIS — K449 Diaphragmatic hernia without obstruction or gangrene: Secondary | ICD-10-CM | POA: Insufficient documentation

## 2024-02-15 DIAGNOSIS — Z934 Other artificial openings of gastrointestinal tract status: Secondary | ICD-10-CM | POA: Insufficient documentation

## 2024-02-15 DIAGNOSIS — Z880 Allergy status to penicillin: Secondary | ICD-10-CM | POA: Insufficient documentation

## 2024-02-15 DIAGNOSIS — D649 Anemia, unspecified: Secondary | ICD-10-CM | POA: Insufficient documentation

## 2024-02-15 DIAGNOSIS — K219 Gastro-esophageal reflux disease without esophagitis: Secondary | ICD-10-CM | POA: Insufficient documentation

## 2024-02-15 DIAGNOSIS — Z681 Body mass index (BMI) 19 or less, adult: Secondary | ICD-10-CM | POA: Insufficient documentation

## 2024-02-15 DIAGNOSIS — Z79899 Other long term (current) drug therapy: Secondary | ICD-10-CM | POA: Insufficient documentation

## 2024-02-15 DIAGNOSIS — L0291 Cutaneous abscess, unspecified: Principal | ICD-10-CM | POA: Insufficient documentation

## 2024-02-15 DIAGNOSIS — R45851 Suicidal ideations: Secondary | ICD-10-CM | POA: Insufficient documentation

## 2024-02-15 DIAGNOSIS — T85598A Other mechanical complication of other gastrointestinal prosthetic devices, implants and grafts, initial encounter: Secondary | ICD-10-CM | POA: Diagnosis present

## 2024-02-15 DIAGNOSIS — G90A Postural orthostatic tachycardia syndrome (POTS): Secondary | ICD-10-CM | POA: Insufficient documentation

## 2024-02-15 DIAGNOSIS — L02211 Cutaneous abscess of abdominal wall: Secondary | ICD-10-CM | POA: Insufficient documentation

## 2024-02-15 DIAGNOSIS — R109 Unspecified abdominal pain: Secondary | ICD-10-CM

## 2024-02-15 DIAGNOSIS — E43 Unspecified severe protein-calorie malnutrition: Secondary | ICD-10-CM | POA: Insufficient documentation

## 2024-02-15 DIAGNOSIS — R6339 Other feeding difficulties: Secondary | ICD-10-CM | POA: Insufficient documentation

## 2024-02-15 DIAGNOSIS — K9413 Enterostomy malfunction: Secondary | ICD-10-CM

## 2024-02-15 DIAGNOSIS — Z881 Allergy status to other antibiotic agents status: Secondary | ICD-10-CM | POA: Insufficient documentation

## 2024-02-15 LAB — BASIC METABOLIC PANEL
ANION GAP: 8 mmol/L (ref 4–13)
BUN/CREA RATIO: 15 (ref 6–22)
BUN: 9 mg/dL (ref 8–25)
CALCIUM: 9 mg/dL (ref 8.6–10.2)
CHLORIDE: 106 mmol/L (ref 96–111)
CO2 TOTAL: 25 mmol/L (ref 22–30)
CREATININE: 0.6 mg/dL (ref 0.60–1.05)
GLUCOSE: 99 mg/dL (ref 65–125)
POTASSIUM: 3.7 mmol/L (ref 3.5–5.1)
SODIUM: 139 mmol/L (ref 136–145)
eGFRcr - FEMALE: >90 mL/min/1.73mˆ2 (ref >=60)

## 2024-02-15 LAB — CBC WITH DIFF
BASOPHIL #: <0.04 x10ˆ3/uL (ref <=0.20)
BASOPHIL %: 0.2 %
EOSINOPHIL #: <0.04 x10ˆ3/uL (ref <=0.50)
EOSINOPHIL %: 0.3 %
HCT: 43.7 % (ref 34.8–46.0)
HGB: 14.6 g/dL (ref 11.5–16.0)
IMMATURE GRANULOCYTE #: <0.04 x10ˆ3/uL (ref <0.10)
IMMATURE GRANULOCYTE %: 0.2 % (ref 0.0–1.0)
LYMPHOCYTE #: 2.07 x10ˆ3/uL (ref 1.00–4.80)
LYMPHOCYTE %: 33.1 %
MCH: 30.5 pg (ref 26.0–32.0)
MCHC: 33.4 g/dL (ref 31.0–35.5)
MCV: 91.2 fL (ref 78.0–100.0)
MONOCYTE #: 0.37 x10ˆ3/uL (ref 0.20–1.10)
MONOCYTE %: 5.9 %
MPV: 8.6 fL — ABNORMAL LOW (ref 8.7–12.5)
NEUTROPHIL #: 3.77 x10ˆ3/uL (ref 1.50–7.70)
NEUTROPHIL %: 60.3 %
PLATELETS: 363 x10ˆ3/uL (ref 150–400)
RBC: 4.79 x10ˆ6/uL (ref 3.85–5.22)
RDW-CV: 12.5 % (ref 11.5–15.5)
WBC: 6.3 x10ˆ3/uL (ref 3.7–11.0)

## 2024-02-15 LAB — HEPATIC FUNCTION PANEL
ALBUMIN: 4.3 g/dL (ref 3.5–5.0)
ALKALINE PHOSPHATASE: 69 U/L (ref 40–110)
ALT (SGPT): 7 U/L (ref <31)
AST (SGOT): 12 U/L (ref 11–34)
BILIRUBIN DIRECT: 0.2 mg/dL (ref 0.1–0.4)
BILIRUBIN TOTAL: 0.4 mg/dL (ref 0.3–1.3)
PROTEIN TOTAL: 7.2 g/dL (ref 6.4–8.3)

## 2024-02-15 LAB — SEDIMENTATION RATE: ERYTHROCYTE SEDIMENTATION RATE (ESR): 1 mm/h (ref <20)

## 2024-02-15 LAB — C-REACTIVE PROTEIN(CRP),INFLAMMATION: CRP INFLAMMATION: 0.4 mg/L (ref <8.0)

## 2024-02-15 MED ORDER — AMOXICILLIN 875 MG-POTASSIUM CLAVULANATE 125 MG TABLET
1.0000 | ORAL_TABLET | Freq: Two times a day (BID) | ORAL | Status: DC
  Administered 2024-02-15: 1 via ORAL
  Administered 2024-02-16: 0 via ORAL
  Filled 2024-02-15 (×2): qty 1

## 2024-02-15 MED ORDER — DICYCLOMINE 10 MG CAPSULE
10.0000 mg | ORAL_CAPSULE | Freq: Four times a day (QID) | ORAL | Status: DC | PRN
  Administered 2024-02-16: 10 mg via ORAL
  Filled 2024-02-15 (×2): qty 1

## 2024-02-15 MED ORDER — ACETAMINOPHEN 10 MG/ML INTRAVENOUS SOLUTION - PARTIAL PREMIX DOSES
15.0000 mg/kg | Freq: Four times a day (QID) | INTRAVENOUS | Status: DC | PRN
  Administered 2024-02-15: 702 mg via INTRAVENOUS
  Administered 2024-02-15: 0 mg via INTRAVENOUS
  Filled 2024-02-15: qty 70.2

## 2024-02-15 MED ORDER — KETOROLAC 15 MG/ML INJECTION SOLUTION
15.0000 mg | Freq: Four times a day (QID) | INTRAMUSCULAR | Status: AC | PRN
  Administered 2024-02-15 – 2024-02-18 (×6): 15 mg via INTRAVENOUS
  Filled 2024-02-15 (×6): qty 1

## 2024-02-15 MED ORDER — DEXTROSE 5% IN WATER (D5W) FLUSH BAG - 250 ML: INTRAVENOUS | Status: DC | PRN

## 2024-02-15 MED ORDER — PREGABALIN 50 MG CAPSULE: 50.0000 mg | ORAL_CAPSULE | Freq: Three times a day (TID) | ORAL | Status: DC

## 2024-02-15 MED ORDER — ACETAMINOPHEN 325 MG TABLET: 650.0000 mg | ORAL_TABLET | ORAL | Status: DC | PRN

## 2024-02-15 MED ORDER — ONDANSETRON HCL (PF) 4 MG/2 ML INJECTION SOLUTION: 4.0000 mg | Freq: Three times a day (TID) | INTRAMUSCULAR | Status: DC | PRN

## 2024-02-15 MED ORDER — SODIUM CHLORIDE 0.9 % IV BOLUS
1000.0000 mL | INJECTION | Status: AC
  Administered 2024-02-15: 1000 mL via INTRAVENOUS
  Filled 2024-02-15: qty 1000

## 2024-02-15 MED ORDER — ACETAMINOPHEN 10 MG/ML INTRAVENOUS SOLUTION - PARTIAL PREMIX DOSES
15.0000 mg/kg | Freq: Four times a day (QID) | INTRAVENOUS | Status: DC | PRN
  Filled 2024-02-15: qty 70.2

## 2024-02-15 MED ORDER — METRONIDAZOLE 500 MG/100 ML IN SODIUM CHLOR(ISO) INTRAVENOUS PIGGYBACK
500.0000 mg | INJECTION | Freq: Two times a day (BID) | INTRAVENOUS | Status: DC
  Filled 2024-02-15: qty 100

## 2024-02-15 MED ORDER — SODIUM CHLORIDE 0.9 % INTRAVENOUS SOLUTION
2.0000 g | INTRAVENOUS | Status: DC
  Administered 2024-02-15: 0 g via INTRAVENOUS
  Administered 2024-02-15: 2 g via INTRAVENOUS
  Filled 2024-02-15: qty 20

## 2024-02-15 MED ORDER — PREGABALIN 50 MG CAPSULE
50.0000 mg | ORAL_CAPSULE | Freq: Two times a day (BID) | ORAL | Status: DC
  Administered 2024-02-15 – 2024-02-16 (×3): 50 mg via ORAL
  Filled 2024-02-15 (×3): qty 1

## 2024-02-15 MED ORDER — LAMOTRIGINE 100 MG TABLET
100.0000 mg | ORAL_TABLET | Freq: Two times a day (BID) | ORAL | Status: DC
  Administered 2024-02-15 – 2024-02-19 (×8): 100 mg via ORAL
  Filled 2024-02-15 (×8): qty 1

## 2024-02-15 MED ORDER — SODIUM CHLORIDE 0.9 % (FLUSH) INJECTION SYRINGE: 2.0000 mL | INJECTION | INTRAMUSCULAR | Status: DC | PRN

## 2024-02-15 MED ORDER — ONDANSETRON 4 MG DISINTEGRATING TABLET
4.0000 mg | ORAL_TABLET | Freq: Four times a day (QID) | ORAL | Status: DC | PRN
  Administered 2024-02-15 – 2024-02-19 (×6): 4 mg via ORAL
  Filled 2024-02-15 (×6): qty 1

## 2024-02-15 MED ORDER — SODIUM CHLORIDE 0.9 % INTRAVENOUS SOLUTION: 2.0000 g | INTRAVENOUS | Status: DC

## 2024-02-15 MED ORDER — SODIUM CHLORIDE 0.9% FLUSH BAG - 250 ML: INTRAVENOUS | Status: DC | PRN

## 2024-02-15 MED ORDER — KETOROLAC 30 MG/ML (1 ML) INJECTION SOLUTION
15.0000 mg | INTRAMUSCULAR | Status: AC
  Administered 2024-02-15: 15 mg via INTRAVENOUS
  Filled 2024-02-15: qty 1

## 2024-02-15 MED ORDER — ELECTROLYTE-R (PH 7.4) INTRAVENOUS SOLUTION
INTRAVENOUS | Status: AC
  Administered 2024-02-16: 0 mL via INTRAVENOUS

## 2024-02-15 MED ORDER — DICYCLOMINE 10 MG CAPSULE: 10.0000 mg | ORAL_CAPSULE | Freq: Four times a day (QID) | ORAL | Status: DC

## 2024-02-15 MED ORDER — HYDROMORPHONE (PF) 0.5 MG/0.5 ML INJECTION SYRINGE
0.5000 mg | INJECTION | INTRAMUSCULAR | Status: AC
  Administered 2024-02-15: 0.5 mg via INTRAVENOUS
  Filled 2024-02-15: qty 0.5

## 2024-02-15 MED ORDER — ENOXAPARIN 30 MG/0.3 ML SUBCUTANEOUS SYRINGE
30.0000 mg | INJECTION | SUBCUTANEOUS | Status: DC
  Filled 2024-02-15: qty 0.3

## 2024-02-15 MED ORDER — CLONAZEPAM 0.5 MG TABLET
0.5000 mg | ORAL_TABLET | Freq: Three times a day (TID) | ORAL | Status: DC | PRN
  Administered 2024-02-15 – 2024-02-19 (×6): 0.5 mg via ORAL
  Filled 2024-02-15 (×6): qty 1

## 2024-02-15 MED ORDER — CYCLOBENZAPRINE 10 MG TABLET
10.0000 mg | ORAL_TABLET | Freq: Three times a day (TID) | ORAL | Status: DC | PRN
  Administered 2024-02-16 – 2024-02-17 (×3): 10 mg via ORAL
  Filled 2024-02-15 (×3): qty 1

## 2024-02-15 MED ORDER — SODIUM CHLORIDE 0.9 % (FLUSH) INJECTION SYRINGE
2.0000 mL | INJECTION | Freq: Three times a day (TID) | INTRAMUSCULAR | Status: DC
  Administered 2024-02-15: 5 mL
  Administered 2024-02-16: 0 mL
  Administered 2024-02-16: 2 mL
  Administered 2024-02-16 – 2024-02-17 (×2): 0 mL
  Administered 2024-02-17: 6 mL
  Administered 2024-02-17 – 2024-02-19 (×6): 0 mL

## 2024-02-15 MED ORDER — ACETAMINOPHEN 1,000 MG/100 ML (10 MG/ML) INTRAVENOUS SOLUTION
15.0000 mg/kg | INTRAVENOUS | Status: AC
  Administered 2024-02-15: 695 mg via INTRAVENOUS
  Administered 2024-02-15: 0 mg via INTRAVENOUS
  Filled 2024-02-15: qty 100

## 2024-02-15 MED ORDER — METRONIDAZOLE 500 MG/100 ML IN SODIUM CHLOR(ISO) INTRAVENOUS PIGGYBACK
500.0000 mg | INJECTION | INTRAVENOUS | Status: DC
  Filled 2024-02-15: qty 100

## 2024-02-15 MED ORDER — HYDROXYZINE HCL 10 MG/5 ML ORAL SOLUTION
50.0000 mg | Freq: Three times a day (TID) | ORAL | Status: DC | PRN
  Administered 2024-02-16 – 2024-02-17 (×2): 50 mg via ORAL
  Filled 2024-02-15 (×3): qty 25

## 2024-02-15 MED ORDER — IOPAMIDOL 370 MG IODINE/ML (76 %) INTRAVENOUS SOLUTION
75.0000 mL | INTRAVENOUS | Status: AC
  Administered 2024-02-15: 75 mL via INTRAVENOUS

## 2024-02-15 MED ORDER — SODIUM CHLORIDE 0.9 % INJECTION SOLUTION
40.0000 mg | Freq: Every day | INTRAVENOUS | Status: DC
  Administered 2024-02-15 – 2024-02-18 (×4): 40 mg via INTRAVENOUS
  Administered 2024-02-19: 0 mg via INTRAVENOUS
  Filled 2024-02-15 (×5): qty 10

## 2024-02-15 NOTE — ED Notes (Signed)
 Patient transferred to [facility] ED via [facility] transport. Unable to give report at time of departure will attempt to give report. Patient stable at time of departure.

## 2024-02-15 NOTE — ED Attending Note (Signed)
 02/15/2024  Please see my attestation to the ED Primary Note for additional documentation of this encounter.    Holly Fritz is a 44 y.o. female p/w   Chief Complaint   Patient presents with    Abnormal Radiology Test     Hx of dysphasia and esophageal spasms. J tube placed 10/22, endorsing increased pain,swelling, and drainage recently. Not tolerating tube feeds, CT at outside facility shows abscess.      Filed Vitals:    02/15/24 1538   BP: 108/79   Pulse: 61   Resp: 14   Temp: 36.7 C (98.1 F)   SpO2: 100%           Medical Decision Making  Transfer from [facility] for abscess around J-tube     Problems Addressed:  Abdominal pain, unspecified abdominal location: acute illness or injury that poses a threat to life or bodily functions    Amount and/or Complexity of Data Reviewed  External Data Reviewed: labs, radiology and notes.  Discussion of management or test interpretation with external provider(s): Discussed with GI on-call.  Discussed with medicine incident command on-call    Risk  OTC drugs.  Prescription drug management.  Decision regarding hospitalization.  Minor surgery with no identified risk factors.

## 2024-02-15 NOTE — H&P (Signed)
 [facility]  Division of [facility] Medicine  Admission H&P    Date of Service:  02/15/2024  Holly Fritz y.o. female  Date of Admission:  02/15/2024  Date of Birth:  Jan 15, 1981  PCP: Chad Taylor Hott, APRN, CNP          Information Obtained from: patient and history reviewed via medical record  Chief Complaint:  Feeding tube dysfunction    HPI: Holly Fritz is a 44 year old female with a history of Ehlers-Danlos syndrome, POTS, autonomic dysfunction with gastroparesis, status post jejunostomy tube placement in October 2025, who was transferred from [facility] for GI evaluation of severe abdominal pain and intolerance of J-tube feeds. She reports three weeks of progressive feed intolerance with severe abdominal pain, bloating, and distension despite transition from continuous to bolus feeds, ultimately stopping use of the J-tube due to pain and only flushing it. Over the past week she developed worsening peristomal pain, and two days prior to presentation attempted oral intake resulting in profuse vomiting and persistent abdominal pain with associated nausea and bloating. She denies fevers or chills. CT abdomen/pelvis at the outside facility showed a small subcutaneous abscess surrounding the jejunostomy measuring approximately 8 x 4 mm without intra-abdominal pathology, with unremarkable labs. She received IV fluids, pain control, and ceftriaxone  and was transferred for further management.    PAST MEDICAL:    Past Medical History:   Diagnosis Date    Autoimmune disorder (CMS HCC)     Awareness under anesthesia     Cervicocranial syndrome     Chiari I malformation (CMS HCC)     Chronic pain     Convergence insufficiency     Dysautonomia (CMS HCC)     Ehlers-Danlos syndrome     Endometriosis     Esophageal reflux     Headache     Hearing loss 2022    History of anesthesia complications     patient states make sure you give me enough medication to go to sleep and stay asleep or I will  wake up during procedure    Neck problem     POTS (postural orthostatic tachycardia syndrome)     Scoliosis     Shortness of breath     Thyroid  nodule     Wears glasses         Past Surgical History:   Procedure Laterality Date    ESOPHAGEAL DILATION      HX BREAST BIOPSY      HX CHOLECYSTECTOMY      HX COLONOSCOPY      HX LAP CHOLECYSTECTOMY      HX MYOMECTOMY      HX OTHER      HX WISDOM TEETH EXTRACTION      POSTERIOR FOSSA DECOMPRESSION      2017            Medications Prior to Admission       Prescriptions    albuterol  sulfate (PROVENTIL  OR VENTOLIN  OR PROAIR ) 90 mcg/actuation Inhalation oral inhaler    Take 2 Puffs by inhalation Every 4 hours as needed Indications: bronchospasm prevention    capsaicin  (ZOSTRIX) 0.025 % Cream    Apply topically Twice per day as needed    cetirizine  (ZYRTEC ) 10 mg Oral Tablet    Take 1 Tablet (10 mg total) by mouth Twice daily Indications: inflammation of the nose due to an allergy    clobetasoL-gauze-silicone 0.05 %- 4 X 4 Apply externally Kit    Apply  topically as needed Indications: skin rash     clonazePAM  (KLONOPIN ) 0.5 mg Oral Tablet    Take 1 Tablet (0.5 mg total) by mouth Three times a day Indications: panic disorder    cyclobenzaprine  (FLEXERIL ) 10 mg Oral Tablet    Take 1 Tablet (10 mg total) by mouth Every 8 hours as needed for Muscle spasms Indications: muscle spasm    diphenhydrAMINE  (BENADRYL ) 25 mg Oral Capsule    Take 1 Capsule (25 mg total) by mouth Once per day as needed for Itching Indications: an allergic reaction    famotidine  (PEPCID ) 40 mg Oral Tablet    Take 1 tablet by mouth twice daily as needed    hydrOXYzine  HCL (ATARAX ) 50 mg Oral Tablet    Take 1 Tablet (50 mg total) by mouth Twice daily Indications: anxious    hyoscyamine  sulfate (LEVSIN /SL) 0.125 mg Sublingual Tablet, Sublingual    Place 2 Tablets (0.25 mg total) under the tongue Three times a day as needed    lamoTRIgine  (LAMICTAL ) 100 mg Oral Tablet    Take 1 Tablet (100 mg total) by mouth  Twice daily Indications: bipolar depression    lidocaine  (LIDODERM ) 5 % Adhesive Patch, Medicated    APPLY 1 PATCH BY TOPICAL ROUTE ONCE DAILY (MAY WEAR UP TO 12HOURS.)    lidocaine  (XYLOCAINE ) 5 % Ointment    APPLY TO AFFECTED AREA 1-4 TIMES DAILY AS NEEDED    lidocaine  4 % + phenylephrine  1 % (1:1) nasal solution    metoclopramide  HCl (REGLAN ) 5 mg Oral Tablet    Take 1 Tablet (5 mg total) by mouth Three times daily before meals Indications: gastroesophageal reflux disease, stomach muscle paralysis and decreased function from diabetes    multivitamin Oral Liquid    Take 5 mL by mouth Daily for 180 days Indications: treatment to prevent vitamin deficiency    omeprazole  (PRILOSEC) 40 mg Oral Capsule, Delayed Release(E.C.)    Take 1 Capsule (40 mg total) by mouth Daily 30 min before breakfast.  Ok to open capsule and take in applesauce.    ondansetron  (ZOFRAN  ODT) 4 mg Oral Tablet, Rapid Dissolve    Take 1 Tab (4 mg total) by mouth Every 8 hours as needed for Nausea/Vomiting    sodium chloride  (SALINE NOSE NASL)    Administer 1 Spray into each nostril Once per day as needed Indications: sinus     Tacrolimus 0.1 % Ointment    Apply topically Twice per day as needed Indications: a type of allergy that causes red and itchy skin called atopic dermatitis     traZODone  (DESYREL ) 50 mg Oral Tablet    Take 1 Tablet (50 mg total) by mouth Every night as needed for Insomnia Indications: insomnia associated with depression    turmeric 400 mg Oral Capsule    Take 1 Capsule (400 mg total) by mouth Daily Indications: inflammation          Allergies[1]    Family History  Family Medical History:       Problem Relation (Age of Onset)    COPD Paternal Grandmother    Congestive Heart Failure Maternal Grandmother    Hypertension (High Blood Pressure) Mother    No Known Problems Father, Sister, Maternal Grandfather, Paternal Grandfather, Daughter, Son, Maternal Aunt, Maternal Uncle, Paternal Aunt, Paternal Uncle, Other    Non-Hodgkin's  lymphoma Maternal Grandmother    Primary Brain tumor Mother            Social History  Social History  Socioeconomic History    Marital status: Legally Separated     Spouse name: Not on file    Number of children: 0    Years of education: Not on file    Highest education level: Not on file   Occupational History    Occupation: disability    Occupation: [employer]   Tobacco Use    Smoking status: Every Day     Current packs/day: 0.50     Average packs/day: 0.5 packs/day for 26.0 years (13.0 ttl pk-yrs)     Types: Cigarettes     Start date: 2000     Passive exposure: Current (smoker)    Smokeless tobacco: Never    Tobacco comments:     1.5 ppd   Vaping Use    Vaping status: Never Used   Substance and Sexual Activity    Alcohol use: Not Currently    Drug use: Yes     Types: Marijuana     Comment: daily    Sexual activity: Not on file   Other Topics Concern    Ability to Walk 1 Flight of Steps without SOB/CP Yes    Routine Exercise Not Asked    Ability to Walk 2 Flight of Steps without SOB/CP Yes    Unable to Ambulate Not Asked    Total Care No    Ability To Do Own ADL's Yes    Uses Walker No    Other Activity Level Not Asked    Uses Cane No   Social History Narrative    Respiratory history: None    Smoking history: Patient is a half a pack a day smoker since 2000. She also smokes marijuana 2-3 times a day.    Significant secondhand smoke exposure: Mother smoked    Smoking in the home: None    Occupational history: Disabled.    Occupational exposures: No exposures to chemicals, dust, pesticides, fumes or asbestos    The patient denies exposure to farms or raw sewage. Not raising wild birds. No hunting and skinning wild animals.    The patient has not been out country recently or [employer]:     [employer]: Chief Financial Officer: Forensic Psychologist: Yes    Humidifier: No    Water : City    Pets in the home: Not currently     Social Determinants of Health     Financial Resource Strain: Low Risk  (12/03/2023)    Financial Resource Strain     SDOH Financial: No   Transportation Needs: High Risk (12/03/2023)    Transportation Needs     SDOH Transportation: Yes, it has kept me from medical appointments or from getting my medications   Social Connections: Low Risk (12/03/2023)    Social Connections     SDOH Social Isolation: 5 or more times a week   Intimate Partner Violence: Low Risk (05/01/2023)    Intimate Partner Violence     SDOH Domestic Violence: No   Housing Stability: Low Risk (12/03/2023)    Housing Stability     SDOH Housing Situation: I have housing.     SDOH Housing Worry: No       ROS: Other than ROS in the HPI, all other systems were negative.    Examination:  Temperature: 36.9 C (98.4 F) Heart Rate: 76 BP (Non-Invasive): (!) 147/90   Respiratory Rate: 14 SpO2: 98 %       General: Thin female in  no apparent distress  Head:  Normocephalic, atraumatic  Eyes:  Pupils equally round and reactive to light, extraocular muscles intact, sclareae non-icteric, conjunctivae clear  Mouth/Throat:  Oropharynx clear, mucous membranes moist  Neck:  Trachea midline  Cardiovascular:  Regular rate and rhythm, no murmurs, rubs, gallops  Respiratory:  Clear to auscultation bilaterally, no wheezes, rhonchi, crackles  Abdominal:  Bowel sounds distant tender to palpation  MSK/Extremities:  No edema. Dorsalis pedis pulses 2+ bilaterally  Skin:  Warm and dry  Neurological:  Awake, A&O x 3.  Psychiatric:  Normal mood & affect.       Labs:  (I have review all labs available. See A/P for my assessment of lab data)      Imaging Studies:  (I have independently reviewed all imaging and tests available. See below for interpretation of imaging studies)       Consults: I have personally reviewed the notes of all consulting physicians, relevant previous outside providers, emergency room/referring providers.     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    J-tube-associated subcutaneous abscess  - CT demonstrates 8 x 4 mm subcutaneous abscess at J-tube site  - No leukocytosis, fevers, or elevated inflammatory markers  - GI consulted, plan for J-tube removal vs exchange tomorrow  - Continue Augmentin   - Monitor for signs of worsening infection    J-tube dysfunction / intolerance of tube feeds / Gastroparesis  - Unable to tolerate tube feeds for -3 weeks due to pain and bloating  - Hold tube feeds  - Minimize flushing unless necessary  - Nutrition plan to be reassessed after GI intervention    Abdominal pain, nausea, vomiting  - Likely secondary to local inflammation and tube dysfunction  - Pain control with tylenol , Toradol , bentyl , Lyrica    - Antiemetics PRN  - IV fluids for hydration          Primary admission Problem: J-tube-associated subcutaneous abscess  Severe Exacerbation    Nutrition:    MNT PROTOCOL FOR DIETITIAN    Additional clinical characteristics related to nutrition:    - monitor for weight changes   - monitor intake and output    - monitor bowel functions                 DVT/PE Prophylaxis: Enoxaparin     I, as the attending physician, have completed the completed the assessment of this patient and my medical decision making process deems that this patient meets appropriate criteria to be admitted to the inpatient hospital for management of the above listed conditions and pathology.    INITIAL H&P LEVEL 3 (TOTAL TIME > 75 MINUTES) (00776)    On 02/15/2024 I spent a total visit time of 77 minutes. Time included review of tests and ordering tests, obtaining/reviewing history, examining the patient, communicating with  consultants, documenting clinical information and counseling the patient and/or family regarding the diagnosis and management plan and coordination of care involved services directly related to patient care.    [name], MD  Attending Physician - Hospitalist  Department of Internal Medicine           [  1]   Allergies  Allergen Reactions    Adhesive Rash    Doxycycline Rash     Agitation      Propranolol Hives/ Urticaria, Itching and Rash    Ciprofloxacin     Gabapentin   Other Adverse Reaction (Add comment)     Patient reports is sensitive to medication.    Levaquin [Levofloxacin]     Penicillins      unknown reaction when an infant - HAS TOLERATED KEFLEX     Tapentadol      Patient reports sensitivity to medication.

## 2024-02-15 NOTE — ED Provider Notes (Signed)
 [facility] - Emergency Department  ED Primary Note  History of Present Illness   Holly Fritz is a 44 y.o. 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 Patient states she has not been able to use the J tube to feed for the past 3 weeks due to severe pain after trying to administer a bolus feed. Since then has only been flushing the J tube without feeding through it, and states that this is causing pain. On Friday, patient tried to eat by mouth, vomited profusely after this, and has had severe abdominal pain since then bringing her to tears. States that the area around her J tube is very tender, and moving or twisting at all causes excruciating pain. Endorses bloating, distension, feeling gas bubbles. Denies fevers/chills. States that she feels like she is dying and would like to have the J tube removed.    Physical Exam   ED Triage Vitals [02/15/24 1538]   BP (Non-Invasive) 108/79   Heart Rate 61   Respiratory Rate 14   Temperature 36.7 C (98.1 F)   SpO2 100 %   Weight 46.8 kg (103 lb 2.8 oz)   Height 1.626 m (5' 4)     Physical Exam  Constitutional:       Appearance: She is ill-appearing.   HENT:      Head: Normocephalic and atraumatic.   Cardiovascular:      Rate and Rhythm: Normal rate and regular rhythm.      Heart sounds: Normal heart sounds.   Pulmonary:      Effort: Pulmonary effort is normal.      Breath sounds: Normal breath sounds.   Abdominal:      General: Abdomen is flat. Bowel sounds are normal.      Palpations: Abdomen is soft.      Tenderness: There is abdominal tenderness.   Skin:     General: Skin is warm and dry.   Neurological:      General: No focal deficit present.      Mental Status: She is alert and oriented to  person, place, and time.   Psychiatric:         Mood and Affect: Mood normal.         Behavior: Behavior normal.       Patient Data   Labs Ordered/Reviewed - No data to display  No orders to display     Medical Decision Making        Medical Decision Making  Patient presenting as transfer from [facility] ED for GI evaluation of J tube dysfunction. CT AP showing abscess around J tube site. CBC, BMP WNL. GI consulted. Recommending admission to medicine, plan for J tube removal 02/16/24.     Risk  Prescription drug management.  Decision regarding hospitalization.                Medications Ordered/Administered in the ED     Clinical Impression   Abdominal pain, unspecified abdominal location (Primary)       Disposition - Admitted      Ria Lazier MD  PGY1 Internal Medicine :

## 2024-02-15 NOTE — ED Nurses Note (Signed)
VMT called for transport. ETA 30 minutes.

## 2024-02-15 NOTE — ED Nurses Note (Signed)
 Patient OTF with transport.

## 2024-02-15 NOTE — ED Triage Notes (Signed)
 Pt presents to the ED POV. Pt had placement of peg tube in October. Past week swelling around tube when flushing. Pt has extreme pain around peg tube.

## 2024-02-15 NOTE — Consults (Signed)
 Gastroenterology and Hepatology   Consult Note    Name: Holly Fritz, Holly Fritz y.o. female  Date of Admission:  02/15/2024  Date of Service: 02/15/2024  Date of Birth:  November 09, 1980 [facility]    Assessment/Plan:  Holly Fritz is a 44 y.o. female with hx of jackhammer esophagus, gastroparesis, Ehlers-Danlos syndrome who presents as a transfer from [facility] due to concern for J tube dysfunction.    Problem List:  Peristomal J tube infection    Recommendations:  - Case and imaging discussed with advanced GI staff, very small potential abscess around the J tube site, on examination with small amount of pus, no surrounding erythema around tube  -  Discussed interventions with patient, she wishes for PEJ tube removal  -  We will plan for endoscopic removal via enteroscopy, tentatively for 1/5.  Patient agreeable and consented for the procedure  -  Please keep NPO midnight prior to the procedure  - Recommend Augmentin  for 5-7 day course pending resolution  -  Recommend inpatient dietitian evaluation  -  Further recommendations following the procedure  -  Please reach out to on-call GI fellow for any questions or concerns    HPI: Holly Fritz is a 44 y.o. female with hx of jackhammer esophagus, gastroparesis, Ehlers-Danlos syndrome who presents as a transfer from [facility] due to concern for J tube dysfunction.  Patient called via mars line 1/3 and we discussed for greater than 20 minutes current condition and need for further evaluation.  Patient subsequently evening of 1/3 was having significant abdominal pain and came to the ED for further evaluation.  Patient states that she has been having pain around the PEJ tube site and feels that her abdomen is more swollen.  She also states that she has been trying to eat PO in the yesterday has significant vomiting episode.  Patient discussed interest in removing the PEJ tube given symptoms.     PMH:  Past Medical History:   Diagnosis Date    Autoimmune  disorder (CMS HCC)     Awareness under anesthesia     Cervicocranial syndrome     Chiari I malformation (CMS HCC)     Chronic pain     Convergence insufficiency     Dysautonomia (CMS HCC)     Ehlers-Danlos syndrome     Endometriosis     Esophageal reflux     Headache     Hearing loss 2022    History of anesthesia complications     patient states make sure you give me enough medication to go to sleep and stay asleep or I will wake up during procedure    Neck problem     POTS (postural orthostatic tachycardia syndrome)     Scoliosis     Shortness of breath     Thyroid  nodule     Wears glasses      PSH:  Past Surgical History:   Procedure Laterality Date    ESOPHAGEAL DILATION      HX BREAST BIOPSY      HX CHOLECYSTECTOMY      HX COLONOSCOPY      HX LAP CHOLECYSTECTOMY      HX MYOMECTOMY      HX OTHER      HX WISDOM TEETH EXTRACTION      POSTERIOR FOSSA DECOMPRESSION      2017     FAMHX:  Family Medical History:       Problem Relation (Age of Onset)  COPD Paternal Grandmother    Congestive Heart Failure Maternal Grandmother    Hypertension (High Blood Pressure) Mother    No Known Problems Father, Sister, Maternal Grandfather, Paternal Grandfather, Daughter, Son, Maternal Aunt, Maternal Uncle, Paternal Aunt, Paternal Uncle, Other    Non-Hodgkin's lymphoma Maternal Grandmother    Primary Brain tumor Mother          SOCHX:  Social History     Socioeconomic History    Marital status: Legally Separated    Number of children: 0   Occupational History    Occupation: disability    Occupation: engineer, civil (consulting) farm   Tobacco Use    Smoking status: Every Day     Current packs/day: 0.50     Average packs/day: 0.5 packs/day for 26.0 years (13.0 ttl pk-yrs)     Types: Cigarettes     Start date: 2000     Passive exposure: Current (smoker)    Smokeless tobacco: Never    Tobacco comments:     1.5 ppd   Vaping Use    Vaping status: Never Used   Substance and Sexual Activity    Alcohol use: Not Currently    Drug use: Yes     Types: Marijuana      Comment: daily   Other Topics Concern    Ability to Walk 1 Flight of Steps without SOB/CP Yes    Ability to Walk 2 Flight of Steps without SOB/CP Yes    Total Care No    Ability To Do Own ADL's Yes    Uses Walker No    Uses Cane No   Social History Narrative    Respiratory history: None    Smoking history: Patient is a half a pack a day smoker since 2000. She also smokes marijuana 2-3 times a day.    Significant secondhand smoke exposure: Mother smoked    Smoking in the home: None    Occupational history: Disabled.    Occupational exposures: No exposures to chemicals, dust, pesticides, fumes or asbestos    The patient denies exposure to farms or raw sewage. Not raising wild birds. No hunting and skinning wild animals.    The patient has not been out country recently or [employer]:     [employer]: Chief Financial Officer: Forensic Psychologist: Yes    Humidifier: No    Water : City    Pets in the home: Not currently     Social Determinants of Health     Financial Resource Strain: Low Risk (12/03/2023)    Financial Resource Strain     SDOH Financial: No   Transportation Needs: High Risk (12/03/2023)    Transportation Needs     SDOH Transportation: Yes, it has kept me from medical appointments or from getting my medications   Social Connections: Low Risk (12/03/2023)    Social Connections     SDOH Social Isolation: 5 or more times a week   Intimate Partner Violence: Low Risk (05/01/2023)    Intimate Partner Violence     SDOH Domestic Violence: No   Housing Stability: Low Risk (12/03/2023)    Housing Stability     SDOH Housing Situation: I have housing.     SDOH Housing Worry: No     Objective:    Review of Systems  - Constitutional: Denies fevers, unintentional weight loss.  - HEENT:  Denies swollen glands.  - CV:  Denies chest pain.  - Pulm.:  Denies  shortness-of-breath.  - GI:   Denies abdominal pain, nausea, vomiting, diarrhea, hematemesis, melena, hematochezia.  - GU:  Denies tea-colored urine.   - MSK:   Denies arthralgias.   - Derm: Denies skin lesions.  - Extremities: Denies swelling.  - Neurologic: Denies syncope.   - Heme: Denies easy bruising.     Physical Exam  Filed Vitals:    02/15/24 1538 02/15/24 1630 02/15/24 1806   BP: 108/79 120/82 (!) 147/90   Pulse: 61 56 76   Resp: 14 14 14    Temp: 36.7 C (98.1 F)  36.9 C (98.4 F)   SpO2: 100% 96% 98%     General: In mild distress  Eyes:  Pupils equal and round  Mouth:  Mucous membranes moist  Neck:  Supple, trachea midline  Lungs:  Non labored breathing, equal chest rising  Heart:  RRR  Abdomen: Soft, mildly tender to palpation around   PEJ tube site, mild pus expressed from the 3 o'clock position of the site  Extremities: No cyanosis, no edema  Neuro:  Grossly normal  Skin:  Warm and dry    Labs: I have reviewed all today's pertinent lab values.   02/15/24 WBC 6.3, Hgb 14.3    Imaging: I have personally reviewed and interpreted images from CT A/P 02/15/24 , which shows  trace abscess around J tube site    Darryle Silvan, MD  PGY-6 Gastroenterology and Hepatology Fellow     I saw and examined the patient.  I reviewed the fellow's note.  I agree with the findings and plan of care as documented in the fellow's note.  Any exceptions/additions are edited/noted.    Donnice Charlie Needle, MD  Assistant Professor, Gastroenterology - Advanced Endoscopy  [facility], [facility] Medicine

## 2024-02-15 NOTE — ED Notes (Signed)
 Patient is resting in stretcher with no signs or symptoms of distress noted. Patient denies any needs at this time. Bed in lowest position, wheels locked, side rails raised x2, and call light within reach. Will continue to monitor.

## 2024-02-15 NOTE — ED Nurses Note (Signed)
VMT at bedside for transport.

## 2024-02-15 NOTE — ED Provider Notes (Signed)
 [facility] - Emergency Department    Name: Holly Fritz  Age and Gender: 44 y.o. female  PCP: Chad Taylor Hott, APRN, CNP    Triage Note:   Medical Problem      HPI:  Holly Fritz is a 44 y.o. female  who presents to the ED today for J tube pain.  Patient has an extensive hx of chronic illnesses. Reports that she had a J tube placed several months ago for gastroparesis. Patient states that she has been having weeks of difficulty tolerating her feeds. She was transitioned from continuous to bolus feeds for inability to tolerate feeds but this has not helped. She feels bloated after even small fluid boluses. In the last week has had vomited episodes, as well as increased pain around her J tube site. No fevers.      History Limitations: None         ROS:  Pertinent ROS noted within HPI.      PHYSICAL EXAM:  Objective:  Nursing notes reviewed  ED Triage Vitals [02/15/24 1057]   BP (Non-Invasive) 121/89   Heart Rate 93   Respiratory Rate 18   Temperature 36.6 C (97.9 F)   SpO2 100 %   Weight 46.3 kg (102 lb)   Height 1.626 m (5' 4)     Filed Vitals:    02/15/24 1057 02/15/24 1245   BP: 121/89 (!) 91/54   Pulse: 93 64   Resp: 18 14   Temp: 36.6 C (97.9 F)    SpO2: 100% 98%       Pertinent Physical Exam:  Constitutional: 44 y.o. female appears stated age in chronically ill health. Resting in bed in no acute distress.  Cardiovascular: Regular rate and regular rhythm.   Pulmonary/Chest: Normal work of breathing, no distress.   Abdominal: Soft, non-distended. TTP surrounding J tube insertion site as well as periumbilical region. No rebound or guarding. Skin without erythema or irritation. No active J tube drainage.          MEDICAL DECISION MAKING    Course and MDM:  Patient seen and examined. Labs and imaging reviewed.  Holly Fritz is a 44 y.o. female presenting for J tube site problem.    Here with pain surrounding her Jtube site. Recently had associated emesis as well. Patient has been  unable to properly tolerate her feeds, switched from continuous to bolus with no improvements. Vitals hemodynamically appropriate on arrival. External inspection of site is reassuring on exam, however, patient with pain on palpation as noted above.  Labs obtained, largely non-diagnostic. CT imaging ordered and notable for a subcutaneous abscess surrounding the jejunostomy. Discussed with patient. Given pain control and nausea medication during her stay. Fluids and antibiotics also started.  MARS line called. GI is requesting patient be transferred ED to ED in [facility] for further GI evaluation. Transport called, patient taken at this time.    Medical Decision Making  Problems Addressed:  Abdominal wall abscess: acute illness or injury    Amount and/or Complexity of Data Reviewed  External Data Reviewed: labs, radiology and notes.  Labs: ordered.  Radiology: ordered and independent interpretation performed.    Risk  OTC drugs.  Prescription drug management.  Parenteral controlled substances.  Decision regarding hospitalization.          Medications Ordered/Administered in the ED   cefTRIAXone  (ROCEPHIN ) 2 g in NS 100 mL IVPB with adaptor (2 g Intravenous New Bag/New Syringe 02/15/24 1302)   metroNIDAZOLE  (FLAGYL ) 500  mg in NS 100 mL premix IVPB (has no administration in time range)   NS bolus infusion 1,000 mL (1,000 mL Intravenous New Bag/New Syringe 02/15/24 1304)   HYDROmorphone  (DILAUDID ) 0.5 mg/0.5 mL injection (0.5 mg Intravenous Given 02/15/24 1122)   ketorolac  (TORADOL ) 30 mg/mL injection (15 mg Intravenous Given 02/15/24 1122)   acetaminophen  (OFIRMEV ) 695 mg (10 mg/mL) IV 69.5 mL (tot vol) (0 mg Intravenous Stopped 02/15/24 1140)   iopamidol  (ISOVUE -370) 76% solution (75 mL Intravenous Given 02/15/24 1138)         Disposition: Transferred to Another Facility        Clinical Impression:     Clinical Impression   Abdominal wall abscess (Primary)                    Dairl Marlo Abts, MD 02/15/2024, 10:59    Department of Emergency Medicine  [place]  [place]      *Parts of this patients chart were completed in a retrospective fashion due to simultaneous direct patient care activities in the Emergency Department.   *This note was partially generated using MModal Fluency Direct system, and there may be some incorrect words, spellings, and punctuation that were not noted in checking the note before saving.

## 2024-02-15 NOTE — ED Nurses Note (Signed)
 Patient arrived to ED room 31 via EMS transfer from [facility]. Patient had a J tube placed 10/22 due to dysphasia and esophageal spasms. Patient was transferred for GI consult due to CT result of an abscess around J tube site. Patient states she has not been able to tolerate tube feeds for the past 3 weeks. Patient also endorses abdominal tenderness, nausea and vomiting. PIV in place from outside facility but c/o pain with flushing. PIV removed. Patient denies any further needs at this time. Call light is within reach, all safety measures in place

## 2024-02-15 NOTE — Incoming ED Transfer Note (Signed)
 J tube placed in October but yesterday starting have pain around the site and felt bloated with eating.     CT showed 8x4 mm abscess at the J tube site.     Pt given Rocephin      20 LAC    97.9 F  HR 66  BP102/61  98% on RA

## 2024-02-16 ENCOUNTER — Observation Stay (HOSPITAL_BASED_OUTPATIENT_CLINIC_OR_DEPARTMENT_OTHER): Admitting: Specialist

## 2024-02-16 ENCOUNTER — Encounter (HOSPITAL_COMMUNITY): Admission: EM | Disposition: A | Discharge status: Home / Self Care | Payer: Self-pay | Source: Home / Self Care

## 2024-02-16 ENCOUNTER — Ambulatory Visit (HOSPITAL_COMMUNITY): Payer: Self-pay

## 2024-02-16 ENCOUNTER — Observation Stay (HOSPITAL_COMMUNITY): Admitting: Specialist

## 2024-02-16 DIAGNOSIS — Z029 Encounter for administrative examinations, unspecified: Secondary | ICD-10-CM

## 2024-02-16 DIAGNOSIS — Z431 Encounter for attention to gastrostomy: Secondary | ICD-10-CM

## 2024-02-16 DIAGNOSIS — K449 Diaphragmatic hernia without obstruction or gangrene: Secondary | ICD-10-CM

## 2024-02-16 DIAGNOSIS — T85598A Other mechanical complication of other gastrointestinal prosthetic devices, implants and grafts, initial encounter: Secondary | ICD-10-CM

## 2024-02-16 DIAGNOSIS — L0291 Cutaneous abscess, unspecified: Secondary | ICD-10-CM

## 2024-02-16 DIAGNOSIS — R45851 Suicidal ideations: Secondary | ICD-10-CM

## 2024-02-16 LAB — CBC WITH DIFF
BASOPHIL #: <0.1 x10ˆ3/uL (ref <=0.20)
BASOPHIL %: 0.7 %
EOSINOPHIL #: <0.1 x10ˆ3/uL (ref <=0.50)
EOSINOPHIL %: 1.3 %
HCT: 37.8 % (ref 34.8–46.0)
HGB: 13.3 g/dL (ref 11.5–16.0)
IMMATURE GRANULOCYTE #: <0.1 x10ˆ3/uL (ref <0.10)
IMMATURE GRANULOCYTE %: 0.2 % (ref 0.0–1.0)
LYMPHOCYTE #: 1.62 x10ˆ3/uL (ref 1.00–4.80)
LYMPHOCYTE %: 26.4 %
MCH: 31.2 pg (ref 26.0–32.0)
MCHC: 35.2 g/dL (ref 31.0–35.5)
MCV: 88.7 fL (ref 78.0–100.0)
MONOCYTE #: 0.51 x10ˆ3/uL (ref 0.20–1.10)
MONOCYTE %: 8.3 %
MPV: 9.2 fL (ref 8.7–12.5)
NEUTROPHIL #: 3.88 x10ˆ3/uL (ref 1.50–7.70)
NEUTROPHIL %: 63.1 %
PLATELETS: 337 x10ˆ3/uL (ref 150–400)
RBC: 4.26 x10ˆ6/uL (ref 3.85–5.22)
RDW-CV: 12.7 % (ref 11.5–15.5)
WBC: 6.1 x10ˆ3/uL (ref 3.7–11.0)

## 2024-02-16 LAB — BASIC METABOLIC PANEL
ANION GAP: 7 mmol/L (ref 4–13)
BUN/CREA RATIO: 14 (ref 6–22)
BUN: 10 mg/dL (ref 8–25)
CALCIUM: 8.9 mg/dL (ref 8.6–10.2)
CHLORIDE: 110 mmol/L (ref 96–111)
CO2 TOTAL: 25 mmol/L (ref 22–30)
GLUCOSE: 82 mg/dL (ref 65–125)
POTASSIUM: 3.6 mmol/L (ref 3.5–5.1)
SODIUM: 142 mmol/L (ref 136–145)
eGFRcr - FEMALE: >90 mL/min/1.73mˆ2 (ref >=60)

## 2024-02-16 LAB — HCG, URINE QUALITATIVE, PREGNANCY
HCG URINE QUALITATIVE: UNDETERMINED — AB
SPECIFIC GRAVITY: 1.008 (ref 1.005–1.030)

## 2024-02-16 LAB — HCG, PLASMA OR SERUM QUANTITATIVE
HCG QUANTITATIVE: <2 m[IU]/mL (ref <10)
HCG QUANTITATIVE: <2 m[IU]/mL (ref <10)

## 2024-02-16 LAB — MAGNESIUM: MAGNESIUM: 2 mg/dL (ref 1.8–2.6)

## 2024-02-16 LAB — C-REACTIVE PROTEIN(CRP),INFLAMMATION: CRP INFLAMMATION: 0.4 mg/L (ref <8.0)

## 2024-02-16 LAB — PHOSPHORUS: PHOSPHORUS: 2.1 mg/dL — ABNORMAL LOW (ref 2.4–4.7)

## 2024-02-16 MED ORDER — SODIUM DI- AND MONOPHOSPHATE-POTASSIUM PHOS MONOBASIC 250 MG TABLET
250.0000 mg | ORAL_TABLET | Freq: Four times a day (QID) | ORAL | Status: AC
  Administered 2024-02-16 (×2): 250 mg via ORAL
  Filled 2024-02-16 (×2): qty 1

## 2024-02-16 MED ORDER — AMOXICILLIN 600 MG-POTASSIUM CLAVULANATE 42.9 MG/5 ML ORAL SUSPENSION: 900.0000 mg | INHALATION_SUSPENSION | Freq: Two times a day (BID) | ORAL | Status: DC

## 2024-02-16 MED ORDER — AMOXICILLIN 200 MG-POTASSIUM CLAVULANATE 28.5 MG/5 ML ORAL SUSPENSION
875.0000 mg | INHALATION_SUSPENSION | Freq: Two times a day (BID) | ORAL | Status: DC
  Administered 2024-02-16: 875 mg via ORAL
  Filled 2024-02-16 (×2): qty 21.9

## 2024-02-16 MED ORDER — MULTIVITAMIN WITH FOLIC ACID 400 MCG TABLET
1.0000 | ORAL_TABLET | Freq: Every day | ORAL | Status: DC
  Administered 2024-02-16 – 2024-02-19 (×4): 1 via ORAL
  Filled 2024-02-16 (×4): qty 1

## 2024-02-16 MED ORDER — GLUCAGON HCL 1 MG/ML SOLUTION FOR INJECTION
INTRAMUSCULAR | Status: AC
  Filled 2024-02-16: qty 1

## 2024-02-16 MED ORDER — MIDAZOLAM 1 MG/ML INJECTION WRAPPER
Freq: Once | INTRAMUSCULAR | Status: DC | PRN
  Administered 2024-02-16: 2 mg via INTRAVENOUS

## 2024-02-16 MED ORDER — LACTATED RINGERS INTRAVENOUS SOLUTION
INTRAVENOUS | Status: DC | PRN
  Administered 2024-02-16: 0 via INTRAVENOUS

## 2024-02-16 MED ORDER — ELECTROLYTE-R (PH 7.4) INTRAVENOUS SOLUTION
INTRAVENOUS | Status: DC
  Administered 2024-02-19: 0 mL via INTRAVENOUS

## 2024-02-16 MED ORDER — ONDANSETRON HCL (PF) 4 MG/2 ML INJECTION SOLUTION
Freq: Once | INTRAMUSCULAR | Status: DC | PRN
  Administered 2024-02-16: 4 mg via INTRAVENOUS

## 2024-02-16 MED ORDER — PROPOFOL 10 MG/ML IV BOLUS
INJECTION | Freq: Once | INTRAVENOUS | Status: DC | PRN
  Administered 2024-02-16: 100 mg via INTRAVENOUS

## 2024-02-16 MED ORDER — PROPOFOL 10 MG/ML INTRAVENOUS EMULSION
INTRAVENOUS | Status: DC | PRN
  Administered 2024-02-16: 500 ug/kg/min via INTRAVENOUS
  Administered 2024-02-16: 0 ug/kg/min via INTRAVENOUS
  Administered 2024-02-16: 200 ug/kg/min via INTRAVENOUS

## 2024-02-16 MED ORDER — ACETAMINOPHEN 325 MG TABLET: 650.0000 mg | ORAL_TABLET | ORAL | Status: DC | PRN

## 2024-02-16 MED ORDER — DEXMEDETOMIDINE 4 MCG/ML IV DILUTION
Freq: Once | INTRAMUSCULAR | Status: DC | PRN
  Administered 2024-02-16 (×2): 8 ug via INTRAVENOUS

## 2024-02-16 MED ORDER — LIDOCAINE (PF) 20 MG/ML (2 %) INJECTION SOLUTION
Freq: Once | INTRAMUSCULAR | Status: DC | PRN
  Administered 2024-02-16: 100 mg via INTRAVENOUS

## 2024-02-16 MED ORDER — AMOXICILLIN 200 MG-POTASSIUM CLAVULANATE 28.5 MG/5 ML ORAL SUSPENSION
875.0000 mg | INHALATION_SUSPENSION | Freq: Two times a day (BID) | ORAL | Status: DC
  Administered 2024-02-16 – 2024-02-19 (×6): 875 mg via ORAL
  Filled 2024-02-16 (×9): qty 21.9

## 2024-02-16 MED ORDER — GLUCAGON HCL 1 MG/ML SOLUTION FOR INJECTION
Freq: Once | INTRAMUSCULAR | Status: DC | PRN
  Administered 2024-02-16: 1 mg via INTRAVENOUS

## 2024-02-16 MED ORDER — MIDAZOLAM 1 MG/ML INJECTION WRAPPER
INTRAMUSCULAR | Status: AC
  Filled 2024-02-16: qty 2

## 2024-02-16 NOTE — Nurses Notes (Signed)
 Alejos, MD notified of patients HCG urine and serum test.  Verbal order to proceed with procedure. Alejos, MD will come to bedside to discuss plan of care with patient.

## 2024-02-16 NOTE — Care Management Notes (Signed)
MSW attempted to meet with patient to complete initial assessment although patient was in the OR.

## 2024-02-16 NOTE — Consults (Signed)
 Therapist attempted to meet with patient at 11:42 but med staff was at bedside. Therapist reviewed patient's chart at 1416 and patient was in OR. Therapist will attempt to meet with patient again tomorrow.    Rumalda Prima, LGSW

## 2024-02-16 NOTE — Care Plan (Addendum)
 Medical Nutrition Therapy Assessment        SUBJECTIVE : Received consult to determine appropriate tube feeds. Admitted for severe abdominal pain and intolerance of J tube feeds. Per chart review, pt has had trouble tolerating tube feeds via J tube. She was first started on Ensure Plus at 45 ml/hr (10/22), then transitioned to Vital 1.5 bolus 160 mL Q4hr which is total 4 cans a day (11/20) which is a peptide based formula. Per graph pt had 4.6% weight loss in the past 3 months (not clinically significant). Noted plan for J-tube removal vs  exchange per MD note 1/4. Pt now at OR.    OBJECTIVE:     Current Diet Order/Nutrition Support:  MNT PROTOCOL FOR DIETITIAN  DIET NPO - SPECIFIC DATE & TIME EXCEPT ALL MEDS WITH SIPS OF WATER      Height Used for Calculations: 162.6 cm (5' 4.02)  Weight Used For Calculations: 45.5 kg (100 lb 5 oz) (standing wt 1/4)  BMI (kg/m2): 17.25  BMI Assessment: less than 18.5 - underweight  Ideal Body Weight (IBW) (kg): 55.04  % Ideal Body Weight: 82.67        Weight Loss:  (4.6% weight loss in 3 months; 47.7 kg  10/22  45.5 kg 1/5)    Physical Assessment: Unable to perform at this time. Pt at OR.    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      Plan/Interventions :   If J tube is removed,   -Consider starting 3 day calorie count to determine if pt gets adequate nutrients via PO.   -If meeting <60% of nutrient needs via PO, recommend alternative means of nutrition (e.g. TPN) if gastric/jejunal feeding not appropriate.   -Add daily multivitamins via  PO  to supplement micronutrient needs     If J tube is replaced,   -Recommend to continue with current home regimen as that is the most hydrolyzed formula for feeding intolerance.  Tube Feed Formula : Vital 1.5  Initial Rate: 10 ml/hr, advance by 10 ml Q8h  Goal Rate: 40 ml/hr  Provides: 1440 cal = 32 cals/kg                65 g protein = 1.4 g/kg                733 ml free water  + additional free water  flushes  120 ml Q4h = 1453 ml total water   -Add daily multivitamins via J tube if replaced; goal TF volume does not meet recommended micronutrient needs.  -Monitor tube feeds tolerance if J tube replaced. HOB >30-45 degree during feeds.     Monitor bowel regularity.  Monitor weights 2x weekly to trend- standing if able for accuracy.    Monitor malnutrition criteria.    Holly Fritz, RD  02/16/2024 14:30       Nutrition Diagnosis: Inadequate protein-energy intake related to dysphagia, intolerance to feeding via J tube as evidenced by likely meeting <75% of estimated energy needs for more than 1 month, BMI 17.16    Holly Fritz, RD     Goal  Outcome Evaluation:     Anxieties, Fears or Concerns: (not recorded)  Individualized Care Needs: (not recorded)  Patient-Specific Goals (Include Timeframe): tolerate nutrition and gain weight (02/16/24 1400)  Plan of Care Reviewed With: (not recorded)

## 2024-02-16 NOTE — Progress Notes (Signed)
 [facility]  Medicine Progress Note    Holly Fritz  Date of service: 02/16/2024  Date of Admission:  02/15/2024    Hospital Day:  LOS: 0 days     CC: Follow up   Interval History: Attempted to eat dinner last night with a turkey sandwich and a cookie. Noted to have bloating and nausea but no emesis. Abdominal pain has improved as well. Plan for J tube removal today.      Medications:   Current Facility-Administered Medications   Medication Dose Route Frequency    acetaminophen  (OFIRMEV ) 702 mg (10 mg/mL) IV 70.2 mL (tot vol)  15 mg/kg Intravenous Q6H PRN    amoxicillin -clavulanate (AUGMENTIN ) 200-28.5 mg per 5 mL oral liquid  875 mg Oral 2x/day    clonazePAM  (klonoPIN ) tablet  0.5 mg Oral 3x/day PRN    cyclobenzaprine  (FLEXERIL ) tablet  10 mg Oral Q8H PRN    D5W 250 mL flush bag   Intravenous Q15 Min PRN    dicyclomine  (BENTYL ) capsule  10 mg Oral Q6H PRN    electrolyte-R pH 7.4 (NORMOSOL-R pH 7.4) premix infusion   Intravenous Continuous    hydrOXYzine  (ATARAX ) 10mg  per 5mL oral liquid  50 mg Oral 3x/day PRN    ketorolac  (TORADOL ) 15 mg/mL injection  15 mg Intravenous Q6H PRN    lamoTRIgine  (LAMICTAL ) tablet  100 mg Oral 2x/day    NS 250 mL flush bag   Intravenous Q15 Min PRN    NS flush syringe  2-6 mL Intracatheter Q8HRS    NS flush syringe  2-6 mL Intracatheter Q1 MIN PRN    ondansetron  (ZOFRAN  ODT) rapid dissolve tablet  4 mg Oral Q6H PRN    ondansetron  (ZOFRAN ) 2 mg/mL injection  4 mg Intravenous Q8H PRN    pantoprazole  (PROTONIX ) 40 mg in NS 10 mL injection  40 mg Intravenous Daily    pregabalin  (LYRICA ) capsule  50 mg Oral 2x/day         Vital Signs:  Temp  Avg: 36.7 C (98 F)  Min: 36.4 C (97.5 F)  Max: 36.9 C (98.4 F)    Pulse  Avg: 65.7  Min: 54  Max: 76 BP  Min: 108/79  Max: 147/90   Resp  Avg: 16.3  Min: 14  Max: 20 SpO2  Avg: 98 %  Min: 95 %  Max: 100 %            Physical Exam:  General Appearance: Thin chronically ill female in emotional distress  Eyes: Conjunctiva clear, pupils  equal and round, sclera non-icteric, EOMI  ENT: Mucous membranes moist  Neck: Supple, trachea midline  Lungs: Clear to auscultation bilaterally, no wheezes or crackles, breathing comfortably  Heart: S1S2, RRR, no murmur     Abdomen: Soft, non-tender, non-distended, normoactive bowel sounds  Extremities/MSK: No peripheral edema  Integumentary:  Skin warm and dry, no rashes or visible lesions  Neurologic: Grossly normal, alert and oriented, no focal neurologic deficits  Psychiatric: Anxious, depressed, tearful      Labs:  I reviewed labs    Radiology:  I reviewed imaging       PT/OT: Yes    Consults: GI        Assessment/ Plan:   Active Hospital Problems    Diagnosis    Primary Problem: Feeding tube dysfunction    Abdominal pain, unspecified abdominal location     44 year old female with 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    Gastroparesis with J-tube intolerance, improving  - Proceed with planned J-tube removal today per GI  - Advance oral diet as tolerated; encourage small, frequent, low-fat, low-fiber meals  - Antiemetics PRN and Protonix  IV daily  - Nutrition to reassess caloric adequacy following tube removal  - Maintenance IV fluids while transitioning off tube feeds  - Speech-Language Pathology consulted to evaluate safety of oral intake    J-tube-associated small subcutaneous abscess  - CT abdomen/pelvis shows trace subcutaneous abscess (8 x 4 mm) without intra-abdominal involvement  - Treat with Augmentin  oral suspension per GI with planned duration: 5 days   - Monitor for worsening erythema, drainage, fevers, or pain    Pain management, opioid sparing (severe gastroparesis)  - Pregabalin  (Lyrica ) 50 mg BID scheduled  - Tylenol  PRN for mild pain   - IV Toradol  PRN for more severe pain (short course only)  - Avoid opioids  - Dicyclomine  (Bentyl ) PRN for  crampy or spasm-type abdominal pain    Mood disturbance  Passive suicidal ideation  - Patient expressed distress related to chronic illness and nutrition uncertainty; states she is dying and withering away. No intent or plan.  - Reassurance provided that alternative nutrition strategies will be pursued  - Consulted Psychiatry for supportive care and therapy    Holly Screen, MD  Attending Physician - Hospitalist  Department of Internal Medicine    On 02/16/2024 I spent a total visit time of 51 minutes. Time included review of tests and ordering tests, obtaining/reviewing history, examining the patient, communicating with consultants, documenting clinical information and counseling the patient and/or family regarding the diagnosis and management plan and coordination of care involved services directly related to patient care.    FOLLOW UP NOTE LEVEL 3 (TOTAL TIME > 50 MINUTES) (00766)

## 2024-02-16 NOTE — Anesthesia Preprocedure Evaluation (Signed)
 ANESTHESIA PRE-OP EVALUATION  Planned Procedure: ENTEROSCOPY (Mouth)  Review of Systems    history of awareness of surgery under anesthesia   anesthesia history negative     patient summary reviewed  nursing notes reviewed        Pulmonary   shortness of breath, past history of smoking  and Smoked in last 24 hours,   Cardiovascular  negative cardio ROS,  NYHA Classification:I  ECG reviewed ,No peripheral edema,  Exercise Tolerance: <4 METS        GI/Hepatic/Renal    GERD        Endo/Other   neg endo/other ROS,       Neuro/Psych/MS    headaches, Substance use, marijuana, Neck problems     Cancer    negative hematology/oncology ROS,                     Physical Assessment      Airway       Mallampati: II    TM distance: >3 FB    Neck ROM: full  Mouth Opening: good.  No Facial hair  No Beard  No endotracheal tube present  No Tracheostomy present    Dental           (+) chipped           Pulmonary    Breath sounds clear to auscultation  (-) no rhonchi, no decreased breath sounds, no wheezes, no rales and no stridor     Cardiovascular    Rhythm: regular  Rate: Normal  (-) no friction rub, carotid bruit is not present, no peripheral edema and no murmur     Other findings              Plan  ASA 2     Planned anesthesia type: MAC                         Intravenous induction     Anesthesia issues/risks discussed are: Dental Injuries, PONV, Cardiac Events/MI, Aspiration, Sore Throat, Intraoperative Awareness/ Recall, Stroke and Eye /Visual Loss.  Anesthetic plan and risks discussed with patient  signed consent obtained          Patient's NPO status is appropriate for Anesthesia.           Plan discussed with CRNA and physician.

## 2024-02-16 NOTE — Anesthesia Transfer of Care (Signed)
 ANESTHESIA TRANSFER OF CARE   Casia Villalon is a 44 y.o. ,female, Weight: 45.4 kg (100 lb)   had Procedure(s):  ENTEROSCOPY  performed  02/16/2024   Primary Service: [name], MD    Past Medical History:   Diagnosis Date    Autoimmune disorder (CMS HCC)     Awareness under anesthesia     Cervicocranial syndrome     Chiari I malformation (CMS HCC)     Chronic pain     Convergence insufficiency     Dysautonomia (CMS HCC)     Ehlers-Danlos syndrome     Endometriosis     Esophageal reflux     Headache     Hearing loss 2022    History of anesthesia complications     patient states make sure you give me enough medication to go to sleep and stay asleep or I will wake up during procedure    Neck problem     POTS (postural orthostatic tachycardia syndrome)     Scoliosis     Shortness of breath     Thyroid  nodule     Wears glasses       Allergy History as of 02/16/24       PENICILLINS         Noted Status Severity Type Reaction    05/05/18 0730 Abran Hurl, RN 06/27/17 Active       Comments: unknown reaction when an infant - HAS TOLERATED KEFLEX      05/05/18 0730 Abran Hurl, RN 06/27/17 Active       Comments: unknown reaction when an infant     06/27/17 1720 Shona Landsberg, RN 06/27/17 Active                 DOXYCYCLINE         Noted Status Severity Type Reaction    05/05/18 0728 Abran Hurl, RN 06/27/17 Active Medium  Rash    Comments: Agitation       06/27/17 1720 Shona Landsberg, RN 06/27/17 Active                 ADHESIVE         Noted Status Severity Type Reaction    08/26/17 2119 Severa Olives, RN STUDENT 04/26/15 Active Medium  Rash              GABAPENTIN          Noted Status Severity Type Reaction    05/03/18 1602 Dorise Lennert, RN 05/03/18 Active    Other Adverse Reaction (Add comment)    Comments: Patient reports is sensitive to medication.               OXYCODONE          Noted Status Severity Type Reaction    12/04/23 1321 Mardy Gibney, Pharmacy Technician 05/03/18 Deleted       Comments: Patient  reports sensitivity to medication.     05/03/18 1604 Dorise Lennert, RN 05/03/18 Active       Comments: Patient reports sensitivity to medication.               TAPENTADOL         Noted Status Severity Type Reaction    05/03/18 1604 Dorise Lennert, [place] 05/03/18 Active       Comments: Patient reports sensitivity to medication.               LEVOFLOXACIN         Noted Status Severity Type Reaction  03/02/22 1637 Gaither Klinefelter, RT (R) 03/02/22 Active                 CIPROFLOXACIN         Noted Status Severity Type Reaction    03/02/22 1637 Gaither Klinefelter, RT (R) 03/02/22 Active                 PROPRANOLOL         Noted Status Severity Type Reaction    03/02/22 1644 [address], [place] 08/25/21 Active Medium  Hives/ Urticaria, Itching, Rash                  I completed my transfer of care / handoff to the receiving personnel during which we discussed:  Access, Airway, All key/critical aspects of case discussed, Antibiotics, Analgesia, Expectation of post procedure, Fluids/Product, Gave opportunity for questions and acknowledgement of understanding, Labs and PMHx      Post Location: PACU                        Additional Info:Report given to RN and questions answered.                                      Last OR Temp: Temperature: 36.8 C (98.2 F)      Airway:* No LDAs found *  Blood pressure 94/67, pulse 60, temperature 36.8 C (98.2 F), resp. rate (!) 10, height 1.626 m (5' 4), weight 45.4 kg (100 lb), last menstrual period 01/23/2024, SpO2 99%, not currently breastfeeding.

## 2024-02-16 NOTE — Anesthesia Postprocedure Evaluation (Signed)
 Anesthesia Post Op Evaluation    Patient: Holly Fritz  Procedure(s):  ENTEROSCOPY    Last Vitals:Temperature: 36.8 C (98.2 F) (02/16/24 1351)  Heart Rate: 60 (02/16/24 1533)  BP (Non-Invasive): 94/67 (02/16/24 1533)  Respiratory Rate: (!) 10 (02/16/24 1533)  SpO2: 99 % (02/16/24 1351)    Patient is sufficiently recovered from the effects of anesthesia to participate in the evaluation and has returned to their pre-procedure level.  Patient location during evaluation: PACU       Patient participation: complete - patient participated  Level of consciousness: awake and alert and responsive to verbal stimuli    Pain management: adequate  Airway patency: patent    Anesthetic complications: no  Cardiovascular status: acceptable  Respiratory status: acceptable  Hydration status: acceptable  Patient post-procedure temperature: Pt Normothermic   PONV Status: Absent

## 2024-02-16 NOTE — Care Plan (Signed)
 02/16/24 0926   Therapist Pager   SLP Assigned/Phone # [place] [phone]   Rehab Session   Document Type rehab contact note     Orders received and acknowledged. Per chart review, pt is NPO for procedure today. ST will continue to monitor and evaluate as appropriate. Thank you for this consult.    [place], [place]  Phone#: [phone]

## 2024-02-17 DIAGNOSIS — F39 Unspecified mood [affective] disorder: Secondary | ICD-10-CM

## 2024-02-17 DIAGNOSIS — R110 Nausea: Secondary | ICD-10-CM

## 2024-02-17 DIAGNOSIS — Y732 Prosthetic and other implants, materials and accessory gastroenterology and urology devices associated with adverse incidents: Secondary | ICD-10-CM

## 2024-02-17 DIAGNOSIS — F4323 Adjustment disorder with mixed anxiety and depressed mood: Secondary | ICD-10-CM

## 2024-02-17 DIAGNOSIS — Z79899 Other long term (current) drug therapy: Secondary | ICD-10-CM

## 2024-02-17 DIAGNOSIS — Z792 Long term (current) use of antibiotics: Secondary | ICD-10-CM

## 2024-02-17 DIAGNOSIS — K9419 Other complications of enterostomy: Secondary | ICD-10-CM

## 2024-02-17 DIAGNOSIS — L02211 Cutaneous abscess of abdominal wall: Secondary | ICD-10-CM

## 2024-02-17 DIAGNOSIS — Z934 Other artificial openings of gastrointestinal tract status: Secondary | ICD-10-CM

## 2024-02-17 LAB — CBC WITH DIFF
BASOPHIL #: <0.1 x10ˆ3/uL (ref <=0.20)
BASOPHIL %: 0.2 %
EOSINOPHIL #: 0.13 x10ˆ3/uL (ref <=0.50)
EOSINOPHIL %: 0.9 %
HCT: 35.9 % (ref 34.8–46.0)
HGB: 12.7 g/dL (ref 11.5–16.0)
IMMATURE GRANULOCYTE #: <0.1 x10ˆ3/uL (ref <0.10)
IMMATURE GRANULOCYTE %: 0.3 % (ref 0.0–1.0)
LYMPHOCYTE #: 1.39 x10ˆ3/uL (ref 1.00–4.80)
LYMPHOCYTE %: 10.2 %
MCH: 31.8 pg (ref 26.0–32.0)
MCHC: 35.4 g/dL (ref 31.0–35.5)
MCV: 89.8 fL (ref 78.0–100.0)
MONOCYTE #: 0.57 x10ˆ3/uL (ref 0.20–1.10)
MONOCYTE %: 4.2 %
MPV: 9.2 fL (ref 8.7–12.5)
NEUTROPHIL #: 11.53 x10ˆ3/uL — ABNORMAL HIGH (ref 1.50–7.70)
NEUTROPHIL %: 84.2 %
PLATELETS: 306 x10ˆ3/uL (ref 150–400)
RBC: 4 x10ˆ6/uL (ref 3.85–5.22)
RDW-CV: 12.4 % (ref 11.5–15.5)
WBC: 13.7 x10ˆ3/uL — ABNORMAL HIGH (ref 3.7–11.0)

## 2024-02-17 LAB — BASIC METABOLIC PANEL
ANION GAP: 8 mmol/L (ref 4–13)
BUN/CREA RATIO: 15 (ref 6–22)
BUN: 9 mg/dL (ref 8–25)
CALCIUM: 8.2 mg/dL — ABNORMAL LOW (ref 8.6–10.2)
CHLORIDE: 108 mmol/L (ref 96–111)
CO2 TOTAL: 24 mmol/L (ref 22–30)
CREATININE: 0.6 mg/dL (ref 0.60–1.05)
GLUCOSE: 90 mg/dL (ref 65–125)
POTASSIUM: 3.5 mmol/L (ref 3.5–5.1)
SODIUM: 140 mmol/L (ref 136–145)
eGFRcr - FEMALE: >90 mL/min/1.73mˆ2 (ref >=60)

## 2024-02-17 LAB — HEPATIC FUNCTION PANEL
ALBUMIN: 3.5 g/dL (ref 3.5–5.0)
ALKALINE PHOSPHATASE: 57 U/L (ref 40–110)
ALT (SGPT): 8 U/L (ref <31)
AST (SGOT): 18 U/L (ref 11–34)
BILIRUBIN DIRECT: 0.3 mg/dL (ref 0.1–0.4)
BILIRUBIN TOTAL: 0.9 mg/dL (ref 0.3–1.3)
PROTEIN TOTAL: 5.9 g/dL — ABNORMAL LOW (ref 6.4–8.3)

## 2024-02-17 LAB — MAGNESIUM: MAGNESIUM: 1.7 mg/dL — ABNORMAL LOW (ref 1.8–2.6)

## 2024-02-17 LAB — C-REACTIVE PROTEIN(CRP),INFLAMMATION: CRP INFLAMMATION: 10.3 mg/L — ABNORMAL HIGH (ref <8.0)

## 2024-02-17 LAB — PHOSPHORUS: PHOSPHORUS: 3 mg/dL (ref 2.4–4.7)

## 2024-02-17 MED ORDER — SENNOSIDES 8.6 MG-DOCUSATE SODIUM 50 MG TABLET
1.0000 | ORAL_TABLET | Freq: Two times a day (BID) | ORAL | Status: DC
  Administered 2024-02-17: 1 via ORAL
  Administered 2024-02-17 – 2024-02-19 (×4): 0 via ORAL
  Filled 2024-02-17 (×4): qty 1

## 2024-02-17 MED ORDER — SIMETHICONE 80 MG CHEWABLE TABLET
80.0000 mg | CHEWABLE_TABLET | Freq: Four times a day (QID) | ORAL | Status: DC | PRN
  Administered 2024-02-17: 80 mg via ORAL
  Filled 2024-02-17: qty 1

## 2024-02-17 MED ORDER — TRAZODONE 50 MG TABLET
50.0000 mg | ORAL_TABLET | Freq: Once | ORAL | Status: AC
  Administered 2024-02-17: 50 mg via ORAL
  Filled 2024-02-17: qty 1

## 2024-02-17 MED ORDER — PREGABALIN 75 MG CAPSULE
75.0000 mg | ORAL_CAPSULE | Freq: Two times a day (BID) | ORAL | Status: DC
  Administered 2024-02-17: 75 mg via ORAL
  Filled 2024-02-17: qty 1

## 2024-02-17 MED ORDER — PREGABALIN 100 MG CAPSULE
100.0000 mg | ORAL_CAPSULE | Freq: Three times a day (TID) | ORAL | Status: DC
  Administered 2024-02-17 – 2024-02-19 (×6): 100 mg via ORAL
  Administered 2024-02-19: 0 mg via ORAL
  Filled 2024-02-17 (×6): qty 1

## 2024-02-17 NOTE — Care Plan (Signed)
 Shift Summary  Swallowing evaluation confirmed safe oral intake of regular solids and thin liquids, with no overt symptoms of aspiration, and SLP provided diet recommendations.   Pain was managed with ketorolac  and pregabalin , with no complaints of pain later in the shift.   Safety and fall prevention measures were maintained, and skin integrity remained stable.   Discharge planning and care discussions were completed with the patient, and rehabilitation potential was evaluated.   Overall, the patient remained alert and upright in bed, with stable MEWS scores and no new symptoms noted.     Absence of Hospital-Acquired Illness or Injury: No new skin integrity issues were documented, and fall prevention measures were maintained throughout the shift; Braden score remained in the minimal risk range and safety rounds were completed regularly. MEWS scores remained stable at 1 during the entire shift, and no symptoms were noted during therapy.     Optimal Comfort and Wellbeing: Comfort measures such as lightweight bedding and clothing were provided, and pain management interventions were performed; emotional state was consistently described as calm and accepting.     Knowledgeable about Health Subject/Topic: Plan of care and discharge planning were discussed with the patient, and information about advance directives was offered and refused.     Optimal Pain Control and Function: Abdominal pain was reported as aching with a pain score of 6 early in the shift, and ketorolac  was administered for moderate pain; later, no complaints of pain were documented.     Improved Nutritional Intake: Swallowing evaluation showed safe oral intake of regular solids and thin liquids without signs or symptoms of aspiration, and the patient self-fed; SLP recommended regular solids and thin liquids, and diet received was regular.     Goal Outcome Evaluation:     Anxieties, Fears or Concerns: (not recorded)  Individualized Care Needs: (not  recorded)  Patient-Specific Goals (Include Timeframe): (not recorded)  Plan of Care Reviewed With: (not recorded)     Patient Progress: improving

## 2024-02-17 NOTE — Care Management Notes (Signed)
 [facility]  Care Management Initial Evaluation    Patient Name: Holly Fritz  Date of Birth: 1980/05/25  Sex: female  Date/Time of Admission: 02/15/2024  3:29 PM  Room/Bed: 16/A  Payor: [employer] / Plan: [place] MEDICAID / Product Type: Medicaid MC /   Primary Care Providers:  Hott, Chad Taylor, APRN, CNP, APRN* (General)    Pharmacy Info:   Preferred Pharmacy       [facility] [address] [place] [address]    [place] [address]    Phone: [phone] Fax: [phone]    Hours: Not open 24 hours          Emergency Contact Info:   Extended Emergency Contact Information  Primary Emergency Contact: Bizzarro,BRIANA  Address: [place] [address] [place]  Home Phone: [phone]  Work Phone: [phone]  Mobile Phone: [phone]  Relation: Sister  Preferred language: English  Interpreter needed? No    History:   Kingsley Bodnar is a 44 y.o., female, admitted feeding tube dysfunction.    Height/Weight: 162.6 cm (5' 4) / 45.4 kg (100 lb)     LOS: 0 days   Admitting Diagnosis: Feeding tube dysfunction [T85.598A]    Assessment:      02/17/24 1025   Assessment Details   Assessment Type Admission   Date of Care Management Update 02/17/24   Date of Next DCP Update 02/18/24   Readmission   Is this a readmission? No   Insurance Information/Type   Insurance type Medicaid  ([facility])   Employment/Financial   Patient has Prescription Coverage?  Yes   Financial/Environmental Concerns none   Living Environment   Select an age group to open lives with row.  Adult   Lives With sibling(s)   Living Arrangements apartment   Able to Return to Prior Arrangements yes   Home Safety   Home Assessment: *No Transportation Available;Stairs in Home   Home Accessibility bed and bath on same level;stairs within home   Care Management Plan   Discharge Planning Status initial meeting   Discharge plan discussed with: Patient   CM will evaluate for rehabilitation  potential yes   Discharge Needs Assessment   Equipment Currently Used at Home cane, straight;shower chair   Equipment Needed After Discharge   (pending)   Discharge Facility/Level of Care Needs Home (Patient/Family Member/other)(code 1)   Transportation Available Medicaid transportation;family or friend will provide   Referral Information   Admission Type observation   Address Verified verified-no changes   Arrived From acute hospital, other   Acute Care Facility [facility]   ADVANCE DIRECTIVES   Does the Patient have an Advance Directive? No, Information Offered and Refused   LAY CAREGIVER    Appointed Lay Caregiver? Yes   Lay Caregiver Name Chaise Passarella   Lay Caregiver Relationship to patient sibling   Lay Caregiver Contact Number [phone]   Stairs Within Home, Primary   Number of Stairs, Within Home, Primary other (see comments)  (13)       MSW met with patient at bedside to complete initial assessment. Patient resides with her sister Mineola Duan) in an apartment with 0 STE, 13 stairs within, with bed/bath on the second level. Patient reported that her sister is available to provide assistance. Quorra said that she will likely need to use [employer] for transportation at time of hospital discharge. MSW confirmed address,  pharmacy, and PCP information. Reeda reported that she uses [facility] in [place], [place] and identified her primary care physician as Chad Hott. Patient does not report previous Oxygen or Dialysis services prior to admission. Gearldine reported current Home Health services with [facility] although stated that she no longer needs Home Health since her feeding tube has been removed during this admission.    Discharge Plan:  Home (Patient/Family Member/other) (code 1)    The patient will continue to be evaluated for developing discharge needs.     Case Manager: Darryle Sox, SOCIAL WORKER  Phone: [phone]

## 2024-02-17 NOTE — Pharmacy (Signed)
 Pharmacy Medication Reconciliation    Patient Name: Deysy, Schabel  Date of Service: 02/16/2024  Date of Admission: 02/15/2024  Date of Birth: [birth year]  Length of Stay:   0 days   Service: HOSPITALIST 4      Transitions of Care:  Discharge Pharmacy services were discussed with the patient and the Meds to [facility] flowsheet and preferred pharmacy information were updated in EMR if applicable and able to assess with patient.    Information was collected from:  Patient and Pharmacy    [facility] [address] [place] [address]  [place]  [place] [address]  Phone: [phone] Fax: [phone]      Does the Patient have Prescription Coverage? Yes    Clarified Prior to Admission Medications:  Prior to Admission medications   Medication Sig Taking Resumed Y/N (RPh) Comments   acetaminophen  (TYLENOL ) 500 mg Oral Tablet Take 1 Tablet (500 mg total) by mouth Every 4 hours as needed for Pain Yes  Yes     albuterol  sulfate (PROVENTIL  OR VENTOLIN  OR PROAIR ) 90 mcg/actuation Inhalation oral inhaler Take 2 Puffs by inhalation Every 4 hours as needed Indications: bronchospasm prevention Yes  No     cetirizine  (ZYRTEC ) 10 mg Oral Tablet Take 1 Tablet (10 mg total) by mouth Twice daily Indications: inflammation of the nose due to an allergy Yes  No     clobetasoL-gauze-silicone 0.05 %- 4 X 4 Apply externally Kit Apply topically as needed Indications: skin rash  Yes       clonazePAM  (KLONOPIN ) 0.5 mg Oral Tablet Take 1 Tablet (0.5 mg total) by mouth Three times a day Indications: panic disorder Yes  Yes     cyclobenzaprine  (FLEXERIL ) 10 mg Oral Tablet Take 1 Tablet (10 mg total) by mouth Every 8 hours as needed for Muscle spasms Indications: muscle spasm   Yes  Hasn't needed.   diphenhydrAMINE  (BENADRYL ) 25 mg Oral Capsule Take 1 Capsule (25 mg total) by mouth Once per day as needed for Itching Indications: an allergic reaction   No  Only takes when itching is not controlled by hydroxyzine .   famotidine  (PEPCID ) 40 mg  Oral Tablet Take 1 tablet by mouth twice daily as needed Yes  No     hydrOXYzine  HCL (ATARAX ) 50 mg Oral Tablet Take 1 Tablet (50 mg total) by mouth Twice daily Indications: anxious Yes  Yes- TID PRN @ [facility]     hyoscyamine  sulfate (LEVSIN /SL) 0.125 mg Sublingual Tablet, Sublingual Place 2 Tablets (0.25 mg total) under the tongue Three times a day as needed Yes  No     lamoTRIgine  (LAMICTAL ) 100 mg Oral Tablet Take 1 Tablet (100 mg total) by mouth Twice daily Indications: bipolar depression Yes  Yes     lidocaine  (LIDODERM ) 5 % Adhesive Patch, Medicated APPLY 1 PATCH BY TOPICAL ROUTE ONCE DAILY (MAY WEAR UP TO 12HOURS.) Yes  No     lidocaine  (XYLOCAINE ) 5 % Ointment APPLY TO AFFECTED AREA 1-4 TIMES DAILY AS NEEDED Yes  No     metoclopramide  HCl (REGLAN ) 5 mg Oral Tablet Take 1 Tablet (5 mg total) by mouth Three times daily before meals Indications: gastroesophageal reflux disease, stomach muscle paralysis and decreased function from diabetes  Patient not taking: Reported on 02/16/2024 Indications: gastroesophageal reflux disease, stomach muscle paralysis and decreased function from diabetes   No  Has not started.   omeprazole  (PRILOSEC) 40 mg Oral Capsule, Delayed Release(E.C.) Take 1 Capsule (40 mg total)  by mouth Daily 30 min before breakfast.  Ok to open capsule and take in applesauce. Yes  Yes- IV @ [facility]     ondansetron  (ZOFRAN  ODT) 4 mg Oral Tablet, Rapid Dissolve Take 1 Tab (4 mg total) by mouth Every 8 hours as needed for Nausea/Vomiting Yes  Yes- IV @      SIMETHICONE  ORAL Take 1 Tablet by mouth Once per day as needed Yes  Yes     Tacrolimus 0.1 % Ointment Apply topically Twice per day as needed Indications: a type of allergy that causes red and itchy skin called atopic dermatitis  Yes  No  PRN   traZODone  (DESYREL ) 50 mg Oral Tablet Take 1 Tablet (50 mg total) by mouth Every night as needed for Insomnia Indications: insomnia associated with depression   No  Hasn't needed.   turmeric 400 mg Oral Capsule  Take 1 Capsule (400 mg total) by mouth Daily Indications: inflammation   No  Taking PRN for pain relief. Hasn't needed.       Did patient's home medication list require updates? Yes    Medications UPDATED on Prior to Admission Med List:  none    Medications ADDED to Prior to Admission Med List:  acetaminophen  (TYLENOL ) 500 mg Oral Tablet  SIMETHICONE  ORAL      Allergies:    Allergies   Allergen Reactions    Adhesive Rash    Doxycycline Rash     Agitation      Propranolol Hives/ Urticaria, Itching and Rash    Ciprofloxacin     Gabapentin   Other Adverse Reaction (Add comment)     Patient reports is sensitive to medication.    Levaquin [Levofloxacin]     Penicillins      unknown reaction when an infant - HAS TOLERATED KEFLEX     Tapentadol      Patient reports sensitivity to medication.       Ozell Glatter, Pharmacy Technician    Did pharmacist make suggestions for medication reconciliation? Yes    Medications REMOVED from home medication list:  - capsaicin  (ZOSTRIX) 0.025 % Cream  - multivitamin Oral Liquid  - sodium chloride  (SALINE NOSE NASL)    Pharmacist Recommendations:  - Team to review above medication list, changes, & compliance. Please resume appropriate home meds as clinically indicated.       Deloros Beretta Turco, PHARMD

## 2024-02-17 NOTE — Nurses Notes (Signed)
 Patient tried to eat dinner and had about 1/3 of the meal. She was unable to tolerate the food that she ate and has had abdominal discomfort since. She has taken several medications to help with the discomfort, but it has only helped a little bit. Will continue to monitor the patient.

## 2024-02-17 NOTE — Progress Notes (Signed)
 [facility]  Medicine Progress Note    Sua Forgette  Date of service: 02/17/2024  Date of Admission:  02/15/2024    Hospital Day:  LOS: 0 days     CC: Follow up   Interval History: J tube removed yesterday. Attempted to eat by mouth with significant abdominal distension and pain. Tolerated the meal with nausea but without vomiting. Reports significant distress.       Medications:   Current Facility-Administered Medications   Medication Dose Route Frequency    acetaminophen  (TYLENOL ) tablet  650 mg Oral Q4H PRN    amoxicillin -clavulanate (AUGMENTIN ) 200-28.5 mg per 5 mL oral liquid  875 mg Oral 2x/day    clonazePAM  (klonoPIN ) tablet  0.5 mg Oral 3x/day PRN    cyclobenzaprine  (FLEXERIL ) tablet  10 mg Oral Q8H PRN    D5W 250 mL flush bag   Intravenous Q15 Min PRN    dicyclomine  (BENTYL ) capsule  10 mg Oral Q6H PRN    electrolyte-R pH 7.4 (NORMOSOL-R pH 7.4) premix infusion   Intravenous Continuous    hydrOXYzine  (ATARAX ) 10mg  per 5mL oral liquid  50 mg Oral 3x/day PRN    ketorolac  (TORADOL ) 15 mg/mL injection  15 mg Intravenous Q6H PRN    lamoTRIgine  (LAMICTAL ) tablet  100 mg Oral 2x/day    multivitamin (THERA) tablet  1 Tablet Oral Daily    NS 250 mL flush bag   Intravenous Q15 Min PRN    NS flush syringe  2-6 mL Intracatheter Q8HRS    NS flush syringe  2-6 mL Intracatheter Q1 MIN PRN    ondansetron  (ZOFRAN  ODT) rapid dissolve tablet  4 mg Oral Q6H PRN    ondansetron  (ZOFRAN ) 2 mg/mL injection  4 mg Intravenous Q8H PRN    pantoprazole  (PROTONIX ) 40 mg in NS 10 mL injection  40 mg Intravenous Daily    pregabalin  (LYRICA ) capsule  100 mg Oral 3x/day    sennosides-docusate sodium  (SENOKOT-S) 8.6-50mg  per tablet  1 Tablet Oral 2x/day    simethicone  (MYLICON) chewable tablet  80 mg Oral Q6H PRN         Vital Signs:  Temp  Avg: 36.8 C (98.3 F)  Min: 36.6 C (97.9 F)  Max: 37.1 C (98.8 F)    Pulse  Avg: 69.7  Min: 57  Max: 81 BP  Min: 117/70  Max: 155/88   Resp  Avg: 17.5  Min: 16  Max: 18 SpO2  Avg: 98.2 %   Min: 96 %  Max: 100 %            Physical Exam:  General Appearance: Emotional distress  Eyes: Conjunctiva clear, pupils equal and round, sclera non-icteric, EOMI  ENT: Mucous membranes moist  Neck: Supple, trachea midline  Lungs: Clear to auscultation bilaterally, no wheezes or crackles, breathing comfortably  Heart: S1S2, RRR, no murmur     Abdomen: Soft, tender and mildly distended  Extremities/MSK: No peripheral edema  Integumentary:  Skin warm and dry, no rashes or visible lesions  Neurologic: Grossly normal, alert and oriented, no focal neurologic deficits  Psychiatric: Mood/affect appropriate, speech normal      Labs:  I reviewed labs    Radiology:  I reviewed imaging         Assessment/ Plan:   Active Hospital Problems    Diagnosis    Primary Problem: Feeding tube dysfunction    Pain of jejunostomy tube site (CMS HCC)    Abdominal pain, unspecified abdominal location     44 year old  female with 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     Gastroparesis with J-tube intolerance, improving  - S/p J-tube removal 1/5  - Advance oral diet as tolerated; encourage small, frequent, low-fat, low-fiber meals  - Antiemetics PRN and Protonix  IV daily  - Nutrition to reassess caloric adequacy  - Maintenance IV fluids while transitioning off tube feeds  - MBSS scheduled for tomorrow     J-tube-associated small subcutaneous abscess  - CT abdomen/pelvis shows trace subcutaneous abscess (8 x 4 mm) without intra-abdominal involvement  - Treat with Augmentin  oral suspension per GI with planned duration: 5 days   - Monitor for worsening erythema, drainage, fevers, or pain     Pain management, opioid sparing (severe gastroparesis)  - Pregabalin  (Lyrica ) 100 mg TID   - Tylenol  PRN for mild pain   - IV Toradol  PRN for more severe pain (short course only)  - Avoid opioids  -  Dicyclomine  (Bentyl ) PRN for crampy or spasm-type abdominal pain     Mood disturbance  Passive suicidal ideation  - Patient expressed distress related to chronic illness and nutrition uncertainty; states she is dying and withering away. No intent or plan.  - Reassurance provided that alternative nutrition strategies will be pursued  - Consulted Psychiatry for supportive care and therapy     [name], MD  Attending Physician - Hospitalist  Department of Internal Medicine    On 02/17/2024 I spent a total visit time of 65 minutes. Time included review of tests and ordering tests, obtaining/reviewing history, examining the patient, communicating with consultants, documenting clinical information and counseling the patient and/or family regarding the diagnosis and management plan and coordination of care involved services directly related to patient care.    FOLLOW UP NOTE LEVEL 3 (TOTAL TIME > 50 MINUTES) ([id number])

## 2024-02-17 NOTE — Transitional Care (Signed)
 [facility]  Transitional Care Coordination  Initial Assessment    Name: Holly Fritz  Address: [address]  [address]  [place] [address]  Phone: [phone]   Date of Birth: 1981-01-20  Date of Service: 02/17/2024      PCP information:  Chad Taylor Hott, APRN, [place]  Phone: [phone]  Fax: [phone]      Chart Review:  Patient Status: Chart Review  30 Day Readmission: No        MyChart Status: Active             Preferred Pharmacy       [facility] [address] [place] [address]    [place] [address]    Phone: [phone] Fax: [phone]    Hours: Not open 24 hours      02/17/24 1547   Assessment Type   Assessment type Initial Assessment;PCP verification   Initial Assessment   Initial Assessment 1st Atttempt;Completed   Obtained verbal consent to contact and schedule a PCP follow up appointment within 7-14 days of discharge? No  (Patient states she has an appointment scheduled with her PCP on 02/19/24 and prefers to keep that appointment only. Patient declined for another appointment to be scheduled for her Hosptial follow up appointment.)   Obtained verbal consent to follow up with the TCC when necessary No  (Declined TCC)   Transportation for healthcare appointments Family transports   Obtained verbal consent to speak to lay caregiver Yes   Name of Lay Caregiver Holly Fritz. Mobile [phone]   Relationship of lay caregiver Sister   Discharge to home address listed in chart? Yes   Is home address a P.O Box? No   PCP Verification   PCP Verficiation 1st Atttempt;Completed  (Spoke to Receptionist Robin)   Contacted PCP to verify establishment and determine barriers to follow up Yes   PCP is a External Provider   Last PCP apt 12/15/23   Next PCP apt 02/19/24  (at 3:30 pm)   Does patient have an upcoming appointment scheduled in the next 30 days? Yes  (02/19/24 - Per Grayce this appointment can be used as patients Hospital follow up appointment if needed.)   Is there  appointment availability within 7-14 days of discharge? Yes   Has hospital discharge follow up been scheduled? No   PCP agreeable to follow warfarin/INR if applicable? Yes   PCP agreeable to follow home health if applicable? Yes   PCP office offers designated personnel for Care Coordination? No         Transition Care Assistant: Holly JONETTA Nett, MA  Phone Ext: [phone]

## 2024-02-17 NOTE — Consults (Signed)
 Name: Holly Fritz  DOB: January 21, 1981  MRN: [id number]      Date: 02/17/2024  Time In: 1253  Time Out: 1447  Code: 00645 90+ minutes        Chief Complaint: Individual session. Holly Fritz endorsed symptoms of depression and anxiety.    Subjective: Holly Fritz discussed how her medical issues impact her mental health. The patient discussed loss of relationships she has experienced over the last several years due to death and divorce. The patient expressed that she is currently feeling a loss of purpose because of the physical limitations her medical issues pose. The patient expressed that she occasionally engages in self harm by scraping herself with paper clips, picking her skin, or hitting herself in the thigh. The patient explained that she rarely bleeds when she does this and has never required stitches. She expressed that her self harm is an effort to divert pain. The patient endorsed passive SI. She denied intent and denied a plan. She expressed that she wants to be able to live a normal life and have a job. The patient expressed that she is still willing to continue to fight her illnesses and identified some sources of hope. The patient identified some support including friends. She also shared that she lives with her sister. Therapist worked with patient to identify coping mechanisms and the patient shared that she finds benefit from bilateral stimulation (audio), singing to music, journaling, meditation, and painting. Therapist provided patient with coping skills handouts. Patient follows with therapist and med management outpatient and another facility.     Objective: Holly Fritz appeared alert, cooperative and talkative with therapist. Tearful at times.      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 Utilized CBT to  help patient identify coping mechanisms.    Plan: Patient set to discharge tomorrow. Therapist off work advertising account executive. Patient follows with therapist and med management at an outside facility.       [address], LGSW, 02/17/2024, 15:53    General Psychiatry, pager [phone]

## 2024-02-17 NOTE — Consults (Signed)
 Name: Holly Fritz  DOB: 1980-07-24  MRN: [id number]      Date: 02/17/2024  Time In: 1024  Time Out: 1200  Code: 09208    Chief Complaint: First individual session. Holly Fritz endorsed symptoms of depression and anxiety.    Subjective:  Holly Fritz discussed her medical diagnoses and how hard things have been for her lately in terms of her physical health. She expressed that her physical health is negatively impacting her mental health. The patient expressed some hopelessness in relation to treatment and experiences with different medical facilities. The patient reports previous psychiatric diagnoses of depression and anxiety. She reported that she currently takes Lamictal , Atarax , and Klonopin  and finds them helpful. She also reports following with a therapist outpatient. Therapist worked with patient to explore coping mechanisms and she identified that listening to music is helpful for her. She also identified that when she is able, she enjoys going for walks to sit by the river. The patient's doctor was present for some of session and he provided her with an update and medication recommendations. Therapy session was cut short by another service. Therapist informed patient she would follow up with her a little later in the day.    Objective: Holly Fritz appeared alert, cooperative and talkative with therapist. Tearful at times.      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    Plan: Therapist will follow up with Holly Fritz later today to work on decreasing her anxiety and depressive symptoms.    [address], [facility], LGSW, 02/17/2024, 15:40    General Psychiatry, pager [phone]

## 2024-02-17 NOTE — Care Plan (Signed)
 [facility]  Rehabilitation Services  Speech Therapy Initial Swallow Evaluation     Patient Name: Holly Fritz  Date of Birth: [birth year]  Height: Height: 162.6 cm (5' 4)  Weight: Weight: 45.4 kg (100 lb)  Room/Bed: 16/A  Payor: NELL [place] / Plan: [employer] [place] MEDICAID / Product Type: Medicaid MC /      Assessment:  Functional Level At Time Of Eval: Oropharyngeal phase of swallow WFL for regular solids and Level 0: thin liquids as tolerated. No overt s/s of aspiration with limited presentations. Oral labial seal complete. AP transit timely. Mistimed, effortful swallow initiation. Reports globus with intake. MBSS scheduled for tomorrow (1/7) at 2:30 pm. ST will follow.    SLP Swallowing Diagnosis: pharyngeal dysfunction, esophageal dysfunction  SLP Diet Recommendation: thin liquids, regular solid  Aspiration Precautions: patient is independent, no specific recommendations  Signs/Symptoms of Aspiration Noted (Swallowing): none  Recommended Diagnostics: VFSS (videofluoroscopic swallowing study) (MBS (modified barium swallow))        Discharge Needs:  Speech Therapy Discharge Recommendations: Outpatient therapy services.    Skilled Speech Therapy Needs: Impaired swallowing safety.       Plan:  To provide Speech Therapy services other (see comments) (1-3x/week) for duration of until discharge.      The risks/benefits of therapy have been discussed with the patient/caregiver and he/she is in agreement with the established plan of care.         Subjective & Objective  Functional Oral Intake Scale: 7 - Total oral intake with no restrictions     02/17/24 1240   Therapist Pager   SLP Assigned/Phone # Johncharles Fusselman [phone]   Rehab Session   Document Type evaluation   Total SLP Minutes: 58   Patient Effort good   Symptoms Noted During/After Treatment none   General Information   Patient Profile Reviewed yes   Patient/Family/Caregiver Comments/Observations Pt reports dysphagia x10 years   General Observations of Patient  Pt upright and alert in bed   Pertinent History of Current Functional Problem Per MD, Cameshia Kasel is a 44 year old female with a history of Ehlers-Danlos syndrome, POTS, autonomic dysfunction with gastroparesis, status post jejunostomy tube placement in October 2025, who was transferred from [facility] for GI evaluation of severe abdominal pain and intolerance of J-tube feeds. She reports three weeks of progressive feed intolerance with severe abdominal pain, bloating, and distension despite transition from continuous to bolus feeds, ultimately stopping use of the J-tube due to pain and only flushing it. Over the past week she developed worsening peristomal pain, and two days prior to presentation attempted oral intake resulting in profuse vomiting and persistent abdominal pain with associated nausea and bloating. She denies fevers or chills. CT abdomen/pelvis at the outside facility showed a small subcutaneous abscess surrounding the jejunostomy measuring approximately 8 x 4 mm without intra-abdominal pathology, with unremarkable labs. She received IV fluids, pain control, and ceftriaxone  and was transferred for further management.   Existing Precautions/Restrictions aspiration precautions;fall precautions;full code   Mutuality/Individual Preferences   Anxieties, Fears or Concerns Anxious about PO   Individualized Care Needs Regular solids, thin liquids   Patient-Specific Goals (Include Timeframe) MBSS tomorrow at 2:30p   Plan of Care Reviewed With patient   Functional Status Prior   Communication 0 - understands/communicates without difficulty   Swallowing 2 - difficulty swallowing liquids/foods   Coping/Psychosocial   Observed Emotional State calm;cooperative   Verbalized Emotional State acceptance   Family/Support System   Family/Support Persons family   Involvement  in Care not present at bedside   Cognitive   Cognitive/Neuro/Behavioral WDL WDL   Level of Consciousness alert   Orientation  oriented x 4   Pain Assessment   Pre/Posttreatment Pain Comment No c/o pain   Oral Motor Structure and Function    Additional Documentation Oral Motor Structure/Functional Assessment (Group)   Oral Motor Structure/Functional Assessment   Dentition (Oral Motor) present and adequate   Secretion Management (Oral Motor Assessment) WFL   Mucosal Quality (Oral Motor Assessment) good   Velar Elevation (Oral Motor) WFL   Volitional Swallow (Oral Motor Assessment) no issues initiating volitional swallow   Volitional Cough (Oral Motor Assessment) no issues initiating volitional cough   Oral Musculature (Oral Motor Assessment) WFL   Non Instrumental/Clinical Swallow (NIS)   Additional Documentation Thin Liquid Trial (NIS) (Group);Solid Texture Trial (NIS) (Group)   Thin Liquid Trial (NIS)   Mode of Presentation, Thin Liquid (NIS) self-fed;straw   Oral Phase Results, Thin Liquid (NIS) intact oral phase without signs of dysfunction   Pharyngeal Phase Results, Thin Liquid (NIS) safe swallow, no signs/symptoms of aspiration or penetration   Solid Texture Trial (NIS)   Mode of Presentation, Solid Food (NIS) self-fed   Oral Phase Results, Solid Food (NIS) intact oral phase without signs of dysfunction   Pharyngeal Phase Results, Solid Food (NIS) safe swallow, no signs/symptoms of aspiration or penetration   Swallowing Clinical Impression   Patient's Goals For Discharge return home   SLP Swallowing Diagnosis pharyngeal dysfunction;esophageal dysfunction   Rehab Potential/Prognosis (Swallow Eval) re-evaluate goals as necessary   Functional Level At Time Of Eval Oropharyngeal phase of swallow [facility] for regular solids and Level 0: thin liquids as tolerated. No overt s/s of aspiration with limited presentations. Oral labial seal complete. AP transit timely. Mistimed, effortful swallow initiation. Reports globus with intake. MBSS scheduled for tomorrow (1/7) at 2:30 pm. ST will follow.   Criteria for Skilled Jayson Days Met demonstrates  skilled criteria for intervention   Swallowing (Functional Communication Measure) Level 7   Therapy Frequency other (see comments)  (1-3x/week)   Predicted Duration Therapy Interv (days) until discharge   Expected Duration Therapy Session - minutes 15-30 minutes   SLP Diet Recommendation thin liquids;regular solid   Recommended Diagnostics VFSS (videofluoroscopic swallowing study) (MBS (modified barium swallow))   Recommended Feeding/Eating Techniques (Swallow Eval) patient is independent, no specific recommendations   Signs/Symptoms of Aspiration Noted (Swallowing) none   Plan of care reviewed with: MD;Patient;RN   Dysphagia Goals, SLP   Date Established (Dysphagia Goal, SLP) 02/17/24   Time Frame (Dysphagia Goal, SLP) by discharge   Dysphagia Goal, Oral Nutritional Level Goal no signs/symptoms of aspiration present;safely tolerates recommended diet texture;safely tolerates/maintains adequate oral nutrition     RN agreeable to swallow evaluation.     Therapist:  Emerick Sheehan, SLP   Phone #: [phone]

## 2024-02-17 NOTE — Discharge Instructions (Signed)
 Discharge Instructions Contact Information   If you have any questions regarding your discharge instructions, please contact us :   ?? Hospitalist Transitions Nurse Line     [phone]   Available Monday through Friday, 7:30 AM - 4:30 PM   If you need assistance after hours, on weekends, or on a holiday, please call:   ?? [employer]     [phone] ([phone])   Ask to speak with the Doctor on Call for the Hospitalist Team   If you are experiencing a medical emergency, please call 911 immediately.     Discharge Recommendations/ Plan:Discharge un:[facility] Rehab Placement/Return (not psych) (code 64)      Resources: [facility]- Pt assigned to room 27. Will need a DC order by the acute physician to DC patient from [facility]. This is a DC acute and Admit To rehab . The nurse can call report to [phone]. The unit clerk on the acute unit will need to DC the encounter and request transport (not transfer) to 6 SE IRU.

## 2024-02-17 NOTE — Consults (Signed)
 Gastroenterology and Hepatology  Consult Follow Up Note    Holly, [age] y.o. female  Date of Admission:  02/15/2024  Date of service: 02/17/2024  Date of Birth:  [birth year]    Assessment/Plan:  Holly Fritz is a 44 y.o. female with hx of jackhammer esophagus, gastroparesis, Ehlers-Danlos syndrome who presents as a transfer from [facility] due to concern for J tube dysfunction.     Problem List:  Peristomal J tube infection. S/p Endoscopic extraction of indwelling PEJ tube 1/5    Enteroscopy:   - Small sliding hiatal hernia; otherwise, esophagus unremarkable.   - Stomach unremarkable.   - Duodenum unremarkable.   - Proximal jejunum w/ indwelling 20-Fr pull-type percutaneous endoscopic   jejunostomy tube (PEJ).   - Endoscopic extraction of indwelling PEJ tube. Jejunostomy site marked   w/ endoscopic tattoo.      Recommendations:  - Advance diet as tolerated.   - Keep gauze over PEJ tube site to absorb leakage.   - Rest of the care per primary team  - GI will sign off at this time, please page the on call fellow with any further questions or concerns       Subjective: Patient was seen at bedside. No acute complaints. Abdomen soft.    Objective:  Physical Exam  Filed Vitals:    02/16/24 2032 02/16/24 2308 02/17/24 0258 02/17/24 0752   BP: 128/83 121/79 117/70 121/69   Pulse: 57 72 81 76   Resp: 18 18 16 18    Temp: 36.6 C (97.9 F) 36.6 C (97.9 F) 37.1 C (98.8 F) 36.9 C (98.4 F)   SpO2: 99% 97% 97% 96%     General:            No apparent distress  Eyes:                 Pupils equal and round  Mouth:              Mucous membranes moist  Neck:                Supple, trachea midline  Lungs:              Non labored breathing, equal chest rising  Heart:               RRR  Abdomen:        Soft, non-tender to palpation, non distended  Extremities:     No cyanosis  Neuro:              Grossly normal  Skin:                 Warm and dry    Labs: Labs reviewed. Normal ALP 57 (02/17/24).    Imaging: I have  personally reviewed and interpreted images from CT AP (02/15/24), which shows jejunostomy tube in-place.    Fredia Slovak, MD  PGY4 Gastroenterology and Hepatology Fellow    I saw and examined the patient.  I reviewed the fellow's note.  I agree with the findings and plan of care as documented in the fellow's note.  Any exceptions/additions are edited/noted.    Donnice Charlie Needle, MD  Assistant Professor, Gastroenterology - Advanced Endoscopy  [facility] Medicine

## 2024-02-18 ENCOUNTER — Observation Stay (HOSPITAL_COMMUNITY)

## 2024-02-18 DIAGNOSIS — E43 Unspecified severe protein-calorie malnutrition: Secondary | ICD-10-CM

## 2024-02-18 DIAGNOSIS — D649 Anemia, unspecified: Secondary | ICD-10-CM

## 2024-02-18 DIAGNOSIS — K3184 Gastroparesis: Secondary | ICD-10-CM

## 2024-02-18 DIAGNOSIS — G909 Disorder of the autonomic nervous system, unspecified: Secondary | ICD-10-CM

## 2024-02-18 DIAGNOSIS — R1310 Dysphagia, unspecified: Secondary | ICD-10-CM

## 2024-02-18 LAB — BASIC METABOLIC PANEL
ANION GAP: 8 mmol/L (ref 4–13)
BUN/CREA RATIO: 13 (ref 6–22)
BUN: 8 mg/dL (ref 8–25)
CALCIUM: 8.3 mg/dL — ABNORMAL LOW (ref 8.6–10.2)
CHLORIDE: 110 mmol/L (ref 96–111)
CO2 TOTAL: 22 mmol/L (ref 22–30)
CREATININE: 0.61 mg/dL (ref 0.60–1.05)
GLUCOSE: 79 mg/dL (ref 65–125)
POTASSIUM: 3.5 mmol/L (ref 3.5–5.1)
SODIUM: 140 mmol/L (ref 136–145)

## 2024-02-18 LAB — CBC WITH DIFF
BASOPHIL #: <0.1 x10ˆ3/uL (ref <=0.20)
BASOPHIL %: 0.1 %
EOSINOPHIL #: 0.31 x10ˆ3/uL (ref <=0.50)
EOSINOPHIL %: 4.5 %
HCT: 34.5 % — ABNORMAL LOW (ref 34.8–46.0)
IMMATURE GRANULOCYTE #: <0.1 x10ˆ3/uL (ref <0.10)
IMMATURE GRANULOCYTE %: 0.3 % (ref 0.0–1.0)
LYMPHOCYTE #: 2.18 x10ˆ3/uL (ref 1.00–4.80)
LYMPHOCYTE %: 31.4 %
MCV: 89.4 fL (ref 78.0–100.0)
MONOCYTE #: 0.49 x10ˆ3/uL (ref 0.20–1.10)
MONOCYTE %: 7.1 %
MPV: 9.3 fL (ref 8.7–12.5)
NEUTROPHIL #: 3.94 x10ˆ3/uL (ref 1.50–7.70)
NEUTROPHIL %: 56.6 %
PLATELETS: 307 x10ˆ3/uL (ref 150–400)
RDW-CV: 12.6 % (ref 11.5–15.5)
WBC: 7 x10ˆ3/uL (ref 3.7–11.0)

## 2024-02-18 LAB — IRON TRANSFERRIN AND TIBC
IRON (TRANSFERRIN) SATURATION: 14 % — ABNORMAL LOW (ref 15–50)
IRON: 32 ug/dL — ABNORMAL LOW (ref 45–170)
TOTAL IRON BINDING CAPACITY: 221 ug/dL — ABNORMAL LOW (ref 252–504)
TRANSFERRIN: 158 mg/dL — ABNORMAL LOW (ref 180–360)

## 2024-02-18 LAB — FERRITIN: FERRITIN: 165 ng/mL (ref 5–200)

## 2024-02-18 MED ORDER — BARIUM SULFATE 81 % (W/W) ORAL POWDER
20.0000 mL | ORAL | Status: AC
  Administered 2024-02-18: 20 mL via ORAL

## 2024-02-18 MED ORDER — BARIUM SULFATE 40 % (W/V), 30% (W/W) ORAL PASTE
15.0000 mL | PASTE | ORAL | Status: AC
  Administered 2024-02-18: 15 mL via ORAL

## 2024-02-18 NOTE — Transitional Care (Signed)
 [facility]   Transition of Care Coordination  Chart Review    Name: Holly Fritz  Date of Service: 02/18/2024      PCP's office information:  Chad Taylor Hott, APRN, [place]  Phone: [phone]  Fax: [phone]    Chart Review:  Patient Status: Chart Review  30 Day Readmission: No        MyChart Status: Active         02/18/24 1128   Assessment Type   Assessment type Appointments   Appointments   Appointments PCP appointment  (No appointment scheduled according to IA completed she has an appointment scheduled with her PCP on 02/19/24 and prefers to keep that appointment only. Patient declined for another appointment to be scheduled for her hosptial follow up appointment.)     Rosaline Argyle LPN, Transitions Care Coordinator  Ext:  [phone]

## 2024-02-18 NOTE — Care Plan (Signed)
 Shift Summary  Ketorolac  was administered for moderate pain after an initial pain rating of 8/10 in the head, with supportive measures provided.    Iron  studies revealed low iron  and transferrin, and a regular diet with thin liquids was provided.    Barium sulfate  was administered for imaging in the afternoon.    The patient was agreeable to a swallow evaluation and remained upright and alert in the wheelchair.    Overall, the patient maintained a calm and cooperative emotional state, with no anxieties or concerns voiced and discharge planning in progress.     Optimal Comfort and Wellbeing: Abdominal discomfort and fullness were noted at the start of the shift, with tenderness in the LLQ and a pain rating of 8/10 in the head; ketorolac  was administered for moderate pain, and the patient remained upright and alert in the wheelchair later in the shift. Emotional state was calm and cooperative, and no anxieties or concerns were voiced throughout the shift.     Knowledgeable about Health Subject/Topic: Care was explained, questions were encouraged, and the plan of care was reviewed with the patient multiple times during the shift.     Optimal Pain Control and Function: Pain was initially rated as 8/10 in the head and described as aching and continuous; ketorolac  was given for moderate pain, and supportive measures such as affirmation and reassurance were provided.     Improved Nutritional Intake: Regular diet and thin liquids were provided, and the patient was agreeable to a swallow evaluation; iron  studies revealed low iron  and transferrin, and ferritin was within normal range.     Goal Outcome Evaluation:     Anxieties, Fears or Concerns: none voiced (02/18/24 0820)  Individualized Care Needs: up ad lib (02/18/24 0820)  Patient-Specific Goals (Include Timeframe): DC soon (02/18/24 0820)  Plan of Care Reviewed With: patient (02/18/24 0820)     Patient Progress: no change

## 2024-02-18 NOTE — Care Management Notes (Signed)
 [facility]  Care Management Note    Patient Name: Holly Fritz  Date of Birth: 1980-02-29  Sex: female  Date/Time of Admission: 02/15/2024  3:29 PM  Room/Bed: 16/A  Payor: NELL [place] / Plan: [place] MEDICAID / Product Type: Medicaid [place] /    LOS: 0 days   Primary Care Providers:  Hott, Chad Taylor, APRN, CNP, APRN* (General)    Admitting Diagnosis:  Feeding tube dysfunction [T85.598A]    Assessment:      02/18/24 1335   Assessment Details   Assessment Type Continued Assessment   Date of Care Management Update 02/18/24   Date of Next DCP Update 02/19/24   Insurance Information/Type   Insurance type Medicaid  ([facility])   Home Safety   Home Assessment: *No Transportation Available   Care Management Plan   Discharge Planning Status plan in progress   Projected Discharge Date 02/18/24   CM will evaluate for rehabilitation potential yes   Discharge Needs Assessment   Discharge Facility/Level of Care Needs Home (Patient/Family Member/other)(code 1)   Transportation Available Medicaid transportation   Transportation Arranged [employer]       MSW was notified by Hospitalist 4 that patient is expected to be ready for discharge at 4:00 pm today and will need transportation to her home address.    MSW created task to request [employer] transportation.    Discharge Plan:  Home (Patient/Family Member/other) (code 1)    The patient will continue to be evaluated for developing discharge needs.     Case Manager: Darryle Sox, SOCIAL WORKER  Phone: [phone]

## 2024-02-18 NOTE — Nurses Notes (Signed)
 Paged service, mackenzie reed- 6n obs bed 16 [place]- is pt dc? saw NPO order and wanted to check, also can we change dressing on G-tube site? thanks Jeoffrey Eleazer [phone]

## 2024-02-18 NOTE — Care Plan (Signed)
 [facility]  Rehabilitation Services  Speech Therapy Modified Barium Swallow Study [facility])    Patient Name: Holly Fritz  Date of Birth: [birth year]  Weight:  45.4 kg (100 lb)  Room/Bed: 16/A  Payor: NELL [place] / Plan: [place] MEDICAID / Product Type: Medicaid [place] /       Date/Time of Admission: 02/15/2024  3:29 PM  Admitting Diagnosis:  Feeding tube dysfunction [T85.598A]      Assessment:   Impressions: Moderate oropharyngeal dysphagia. No aspiration of any consistency appreciated during this study. Bolus hold resulted in posterior escape of liquid bolus to valleculae. Diminished tongue base retraction and pharyngeal stripping wave. Swallow initiation delayed as evidenced by bolus head resting in pyriform sinus +2 seconds prior to hyoid excursion. Complete epiglottic inversion. Esophageal retention with retrograde flow through the UES noted with nectar and pureed consistencies. May consider repeat esophagram per radiology APP. Oropharyngeal phase of swallow remains functional for regular solids and Level 0 Liquid: Thin (Thin) with aspiration precautions and double swallows.    Speech Therapy Discharge Recommendations: Outpatient therapy services.    Skilled Speech Therapy Needs: Impaired swallowing safety.       Goals:   - Pt will tolerate safest, least restrictive diet to ensure adequate nutrition and safe d/c to next level of care.       Plan:   Recommendations - Diet: Regular  Recommendations - Liquid: Level 0 Liquids: Regular/Thin  Functional Oral Intake Scale: 7 - Total oral intake with no restrictions  Aspiration Precautions: Small bites, Small sips, Straw-yes, Multiple swallows, and Liquid Wash  Treatment Strategies: Indirect, Direct, Effortful swallow, Supraglottic swallow, Mendelsohn maneuer, Multiple swallows, and Modified CTAR  Other Recommendations: Daily Oral Care  Results & Recommendations Discussed With:Patient and MD    Continue to follow patient according to established plan of  care.  The risks/benefits of therapy have been discussed with the patient/caregiver and he/she is in agreement with the established plan of care.             Subjective:   Flowsheet Info:      02/18/24 1451   Therapist Pager   SLP Assigned/Phone # Emerick [phone]   Rehab Session   Document Type evaluation   Total SLP Minutes: 120   Patient Effort good   Symptoms Noted During/After Treatment none   General Information   Patient Profile Reviewed yes   Patient/Family/Caregiver Comments/Observations Agreeable to swallow evaluation   General Observations of Patient Pt upright and alert in wheelchair   Existing Precautions/Restrictions aspiration precautions;fall precautions;full code   Mutuality/Individual Preferences   Anxieties, Fears or Concerns None stated   Individualized Care Needs Regular solids and Level 0: thin liquids   Patient-Specific Goals (Include Timeframe) d/c   Plan of Care Reviewed With patient         Alert:yes  Cooperative:yes  Follows Directions:yes  Dentition:Natural  Positioning:Chair   Respiratory status:Room Air   Nutrition:PO diet     Objective:     Radiographic View:Lateral  Position:Sitting  Contrast Consistencies:Thin Liquid, Level 2 Liquids: Mildly Thick (Nectar), and Pudding  Total Number of Presentations: 10  MBSImP Component Scores:     COMPONENT Scale SCORE   1 Lip closure (0-4) 0 Resulted in no labial escape   2 Hold Position (0-3) 2 Resulted in posterior escape of less than half of the bolus   3 Bolus Preparation (0-4) NA    4 Bolus Transport (0-4) 1 Demonstrated delayed initiation of tongue motion   5 Oral  Residue (0-4) 1 Was a trace, lining oral structures   6 Swallow Initiation (0-4) 3 Occurred when the bolus head was in the pyriform sinuses   7 Soft Palate Elevation (0-4) 1 Allowed a trace column of contrast or air between soft palate and pharyngeal wall   8 Laryngeal Elevation (0-3) 0 Demonstrated complete superior movement of thyroid  cartilage with complete approximation of  arytenoids to epiglottic petiole   9 Anterior Hyoid Motion (0-2) 0 Demonstrated complete anterior movement   10 Epiglottic Movement (0-2) 0 Resulted in complete inversion   11 Laryngeal Closure (0-2) 0 Was complete with no air or contrast in laryngeal vestibule   12 Pharyngeal Stripping Wave (0-2) 1 Was present, but diminished   13 Pharyngeal Contraction (0-3) NA    14 PES Opening (0-3) 0 Was completely distended and complete duration with no obstruction of flow   15 Tongue Base Retraction (0-4) 2 Allowed a narrow column of contrast or air between the retracted tongue base and the posterior pharyngeal wall   16 Pharyngeal Residue (0-4) 1 Showed a trace within or on pharyngeal structures   17 Esophageal Clearance (0-4) 3 Resulted in esophageal retention with incidence of retrograde bolus flow through the pharyngoesophageal segment     Results:     COMPONENT Scale SCORE   1 Oral Score (0-18) 6    2 Pharyngeal Score (0-29) 4    3 Esophageal Score (0-4) 3       Penetration- Aspiration Scale: 1-Material does not enter the airway.        Therapist:   Emerick Sheehan, SLP   Phone #: [phone]  Treatment Time: 120 minutes

## 2024-02-18 NOTE — Progress Notes (Signed)
 [facility]  Medicine Progress Note    Holly Fritz  Date of service: 02/18/2024  Date of Admission:  02/15/2024    Hospital Day:  LOS: 0 days     CC: Follow up   Interval History: She is tolerating oral intake with small amounts but continues to experience bloating and a sensation of food sitting in her chest after swallowing. Nausea is intermittent and being treated with PRN zofran . She denies emesis, fevers, chills, chest pain, or shortness of breath. She remains anxious about long-term nutrition but is in agreement with ongoing evaluation and plan.      Medications:   Current Facility-Administered Medications   Medication Dose Route Frequency    acetaminophen  (TYLENOL ) tablet  650 mg Oral Q4H PRN    amoxicillin -clavulanate (AUGMENTIN ) 200-28.5 mg per 5 mL oral liquid  875 mg Oral 2x/day    clonazePAM  (klonoPIN ) tablet  0.5 mg Oral 3x/day PRN    cyclobenzaprine  (FLEXERIL ) tablet  10 mg Oral Q8H PRN    D5W 250 mL flush bag   Intravenous Q15 Min PRN    dicyclomine  (BENTYL ) capsule  10 mg Oral Q6H PRN    electrolyte-R pH 7.4 (NORMOSOL-R pH 7.4) premix infusion   Intravenous Continuous    hydrOXYzine  (ATARAX ) 10mg  per 5mL oral liquid  50 mg Oral 3x/day PRN    ketorolac  (TORADOL ) 15 mg/mL injection  15 mg Intravenous Q6H PRN    lamoTRIgine  (LAMICTAL ) tablet  100 mg Oral 2x/day    multivitamin (THERA) tablet  1 Tablet Oral Daily    NS 250 mL flush bag   Intravenous Q15 Min PRN    NS flush syringe  2-6 mL Intracatheter Q8HRS    NS flush syringe  2-6 mL Intracatheter Q1 MIN PRN    ondansetron  (ZOFRAN  ODT) rapid dissolve tablet  4 mg Oral Q6H PRN    ondansetron  (ZOFRAN ) 2 mg/mL injection  4 mg Intravenous Q8H PRN    pantoprazole  (PROTONIX ) 40 mg in NS 10 mL injection  40 mg Intravenous Daily    pregabalin  (LYRICA ) capsule  100 mg Oral 3x/day    sennosides-docusate sodium  (SENOKOT-S) 8.6-50mg  per tablet  1 Tablet Oral 2x/day    simethicone  (MYLICON) chewable tablet  80 mg Oral Q6H PRN         Vital  Signs:  Temp  Avg: 36.8 C (98.2 F)  Min: 36.7 C (98.1 F)  Max: 36.9 C (98.4 F)    Pulse  Avg: 71  Min: 69  Max: 72 BP  Min: 118/80  Max: 148/90   Resp  Avg: 16.6  Min: 16  Max: 18 SpO2  Avg: 97.8 %  Min: 97 %  Max: 99 %            Physical Exam:  General Appearance: No acute distress  Eyes: Conjunctiva clear, pupils equal and round, sclera non-icteric, EOMI  ENT: Mucous membranes moist  Neck: Supple, trachea midline  Lungs: Clear to auscultation bilaterally, no wheezes or crackles, breathing comfortably  Heart: S1S2, RRR, no murmur     Abdomen: Soft, non-tender, mildly distended, mild tenderness to palpation, J tube removal site appears clean and dry  Extremities/MSK: No peripheral edema, 2+ dorsalis pedis and posterior tibial pulses bilaterally, moves all 4 extremities  Integumentary:  Skin warm and dry, no rashes or visible lesions  Neurologic: Grossly normal, alert and oriented, no focal neurologic deficits  Psychiatric: Mood/affect appropriate, speech normal      Labs:  I reviewed labs  Radiology:  I reviewed imaging       PT/OT: Yes    Consults: No        Assessment/ Plan:   Active Hospital Problems    Diagnosis    Primary Problem: Feeding tube dysfunction    Pain of jejunostomy tube site (CMS HCC)    Abdominal pain, unspecified abdominal location     44 year old female with Holly Fritz and 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; J-tube removed on 1/5  - MBSS completed: safe oropharyngeal swallow with no aspiration; esophageal retention noted  - Plan for standard barium esophagram tomorrow to further evaluate esophageal dysmotility/retention  - Continue regular diet as tolerated with aspiration precautions  - Encourage small bites, slow rate, upright  positioning during and after meals    J-tube-associated small subcutaneous abscess, improving  - CT showed small subcutaneous abscess (8  4 mm) without intra-abdominal involvement  - Clinically improving; afebrile, no leukocytosis  - Continue Augmentin  oral suspension  - Total antibiotic duration: 5 days  - Monitor for recurrent pain, erythema, drainage, or fevers    Pain management  - Lyrica  increased to 100 mg TID for neuropathic/visceral pain  - Acetaminophen  PRN  - Ketorolac  PRN (short course only)  - Avoid opioids  - Dicyclomine  PRN for crampy/spasm-type abdominal pain    Nausea  - Ondansetron  PRN  - Pantoprazole  daily    Severe protein-calorie malnutrition  - Oral intake improving but limited by dysmotility symptoms  - Dietitian following for caloric adequacy and gastroparesis-friendly diet  - Maintenance IV fluids as needed while PO intake remains suboptimal  - MNT PROTOCOL FOR DIETITIAN  - DIET REGULAR (RUBY/FMT) Adult Consistency/thickening: Solid: LEVEL 7: Easy to Chew  - DIET NPO - SPECIFIC DATE & TIME    - Additional clinical characteristics related to nutrition:   - monitor for weight changes  - monitor intake and output   - monitor bowel functions    Anemia of chronic inflammation  - Iron  studies: ferritin 165 with low iron , low TIBC, low transferrin, iron  saturation 14%  - Consistent with anemia of chronic inflammation rather than iron  deficiency  - No iron  supplementation indicated at this time  - Continue to monitor CBC    Mood / coping  - Patient has expressed distress related to chronic illness and nutrition uncertainty; denies suicidal ideation, intent, or plan  - Reassurance provided; patient engaged with care plan  - Continue supportive care; consider Social Work or Psychiatry if distress persists    Holly Screen, MD  Attending Physician - Hospitalist  Department of Internal Medicine    On 02/18/2024 I spent a total visit time of 43 minutes. Time included review of tests and ordering tests,  obtaining/reviewing history, examining the patient, communicating with consultants, documenting clinical information and counseling the patient and/or family regarding the diagnosis and management plan and coordination of care involved services directly related to patient care.    FOLLOW UP NOTE LEVEL 2 (TOTAL TIME 35-50 MINUTES) [id number])

## 2024-02-19 ENCOUNTER — Encounter (INDEPENDENT_AMBULATORY_CARE_PROVIDER_SITE_OTHER): Payer: Self-pay | Admitting: Family

## 2024-02-19 ENCOUNTER — Observation Stay (HOSPITAL_COMMUNITY)

## 2024-02-19 ENCOUNTER — Other Ambulatory Visit: Payer: Self-pay

## 2024-02-19 ENCOUNTER — Ambulatory Visit (INDEPENDENT_AMBULATORY_CARE_PROVIDER_SITE_OTHER)

## 2024-02-19 DIAGNOSIS — K9422 Gastrostomy infection: Secondary | ICD-10-CM

## 2024-02-19 LAB — BASIC METABOLIC PANEL
BUN/CREA RATIO: 15 (ref 6–22)
BUN: 9 mg/dL (ref 8–25)
CREATININE: 0.62 mg/dL (ref 0.60–1.05)
GLUCOSE: 87 mg/dL (ref 65–125)
eGFRcr - FEMALE: >90 mL/min/1.73mˆ2 (ref >=60)

## 2024-02-19 LAB — CBC WITH DIFF
BASOPHIL #: <0.1 x10ˆ3/uL (ref <=0.20)
BASOPHIL %: 0.5 %
EOSINOPHIL %: 2.7 %
HCT: 33.7 % — ABNORMAL LOW (ref 34.8–46.0)
HGB: 11.8 g/dL (ref 11.5–16.0)
IMMATURE GRANULOCYTE %: 0.3 % (ref 0.0–1.0)
LYMPHOCYTE %: 26.7 %
MCH: 31.3 pg (ref 26.0–32.0)
MONOCYTE %: 7.8 %
MPV: 9.2 fL (ref 8.7–12.5)
NEUTROPHIL %: 62 %
RBC: 3.77 x10ˆ6/uL — ABNORMAL LOW (ref 3.85–5.22)
RDW-CV: 12.3 % (ref 11.5–15.5)

## 2024-02-19 LAB — PHOSPHORUS: PHOSPHORUS: 3.6 mg/dL (ref 2.4–4.7)

## 2024-02-19 MED ORDER — MULTIVITAMIN WITH FOLIC ACID 400 MCG TABLET
1.0000 | ORAL_TABLET | Freq: Every day | ORAL | 0 refills | Status: AC
  Filled 2024-02-19: qty 30, 30d supply, fill #0

## 2024-02-19 MED ORDER — AMOXICILLIN 200 MG-POTASSIUM CLAVULANATE 28.5 MG/5 ML ORAL SUSPENSION
875.0000 mg | INHALATION_SUSPENSION | Freq: Two times a day (BID) | ORAL | 0 refills | Status: AC
  Filled 2024-02-19: qty 100, 2d supply, fill #0

## 2024-02-19 MED ORDER — DICYCLOMINE 10 MG CAPSULE
10.0000 mg | ORAL_CAPSULE | Freq: Four times a day (QID) | ORAL | 0 refills | Status: AC | PRN
  Filled 2024-02-19: qty 30, 8d supply, fill #0

## 2024-02-19 MED ORDER — BARIUM SULFATE 98 % ORAL POWDER FOR SUSPENSION
50.0000 mL | INHALATION_SUSPENSION | ORAL | Status: AC
  Administered 2024-02-19: 121 g via ORAL

## 2024-02-19 MED ORDER — BARIUM SULFATE 60 % (W/V) ORAL SUSPENSION
20.0000 mL | ORAL | Status: AC
  Administered 2024-02-19: 20 mL via ORAL

## 2024-02-19 MED ORDER — PREGABALIN 100 MG CAPSULE
100.0000 mg | ORAL_CAPSULE | Freq: Three times a day (TID) | ORAL | 0 refills | Status: AC
  Filled 2024-02-19: qty 90, 30d supply, fill #0

## 2024-02-19 NOTE — Transitional Care (Signed)
 [facility]   Transition of Care Coordination  Chart Review    Name: Delorise Talley  Date of Service: 02/19/2024      PCP's office information:  Chad Taylor Hott, APRN, [place]  Phone: [phone]  Fax: [phone]    Chart Review:  Patient Status: Chart Review  30 Day Readmission: No        MyChart Status: Active            02/19/24 1004   Assessment Type   Assessment type Appointments   Appointments   Appointments PCP appointment  (Called PCP office to let them know patient had not discharged that her appt for today 1/8 would need cancelled and rescheduled and they stated the patient had called earlier to change it.)   PCP appointment scheduled within 7 days of discharge   PCP appointment date 02/25/24  (at 8:45)     Rosaline Argyle LPN, Transitions Care Coordinator  Ext:  [phone]

## 2024-02-19 NOTE — Care Plan (Signed)
 Patient being discharged home with sister. Tolerating liquids and was able to take medication. Ordering some food-something light. Awaiting [facility] for transport home.    Problem: Adult Inpatient Plan of Care  Goal: Absence of Hospital-Acquired Illness or Injury  Outcome: Adequate for Discharge  Intervention: Identify and Manage Fall Risk  Recent Flowsheet Documentation  Taken 02/19/2024 0740 by Cena BRAVO, RN  Safety Promotion/Fall Prevention:   fall prevention program maintained   nonskid shoes/slippers when out of bed   safety round/check completed  Intervention: Prevent Skin Injury  Recent Flowsheet Documentation  Taken 02/19/2024 0740 by Cena BRAVO, RN  Body Position: supine, head elevated  Skin Protection: adhesive use limited  Intervention: Prevent and Manage VTE (Venous Thromboembolism) Risk  Recent Flowsheet Documentation  Taken 02/19/2024 0740 by Cena BRAVO, RN  VTE Prevention/Management:   ambulation promoted   dorsiflexion/plantar flexion performed  Intervention: Prevent Infection  Recent Flowsheet Documentation  Taken 02/19/2024 0740 by Cena BRAVO, RN  Infection Prevention:   barrier precautions utilized   single patient room provided   visitors restricted/screened   rest/sleep promoted  Goal: Optimal Comfort and Wellbeing  02/19/2024 1650 by Cena BRAVO, RN  Outcome: Adequate for Discharge  02/19/2024 1001 by Cena BRAVO, RN  Outcome: Shift Focus  Intervention: Provide Person-Centered Care  Recent Flowsheet Documentation  Taken 02/19/2024 0740 by Cena BRAVO, RN  Trust Relationship/Rapport:   care explained   choices provided   emotional support provided   empathic listening provided   questions answered   questions encouraged   reassurance provided   thoughts/feelings acknowledged  Goal: Rounds/Family Conference  Outcome: Adequate for Discharge     Problem: Health Knowledge, Opportunity to Enhance (Adult,Obstetrics,Pediatric)  Goal: Knowledgeable about Health Subject/Topic  Description: Patient will demonstrate the desired  outcomes by discharge/transition of care.  Outcome: Adequate for Discharge  Intervention: Enhance Health Knowledge  Recent Flowsheet Documentation  Taken 02/19/2024 0740 by Cena BRAVO, RN  Supportive Measures:   active listening utilized   positive reinforcement provided   verbalization of feelings encouraged     Problem: Pain Acute  Goal: Optimal Pain Control and Function  Outcome: Adequate for Discharge  Intervention: Optimize Psychosocial Wellbeing  Recent Flowsheet Documentation  Taken 02/19/2024 0740 by Cena BRAVO, RN  Diversional Activities:   television   smartphone  Supportive Measures:   active listening utilized   positive reinforcement provided   verbalization of feelings encouraged  Intervention: Prevent or Manage Pain  Recent Flowsheet Documentation  Taken 02/19/2024 0740 by Cena BRAVO, RN  Medication Review/Management: medications reviewed     Problem: Malnutrition  Goal: Improved Nutritional Intake  02/19/2024 1650 by Cena BRAVO, RN  Outcome: Adequate for Discharge  02/19/2024 1001 by Cena BRAVO, RN  Outcome: Shift Focus

## 2024-02-19 NOTE — Care Management Notes (Signed)
 [facility]  Care Management Note    Patient Name: Holly Fritz  Date of Birth: 1980/04/09  Sex: female  Date/Time of Admission: 02/15/2024  3:29 PM  Room/Bed: 16/A  Payor: [place] / Plan: [place] [employer] / Product Type: Medicaid [place] /    LOS: 0 days   Primary Care Providers:  Hott, Chad Taylor, APRN, CNP, APRN* (General)    Admitting Diagnosis:  Feeding tube dysfunction [T85.598A]    Assessment:      02/19/24 1915   CM Complete   Assessment Type ED   Chart Reviewed Yes     Felecia, RN, wanting update on [facility].  Per Lonni with [facility], pt was denied due to address pt wanted to go to was not address on file with [facility]/pt used her one-time courtesy trip.  Maranda asked pt if she was willing to go to [address] [place]. Pt agreeable. Trip rescheduled, Trip [phone]      Case Manager: Glenys Ned, RN  Phone: [phone]

## 2024-02-19 NOTE — Nurses Notes (Signed)
 Pt reports that IV is painful to flush, charge nurse looked with ultrasound machine and unable to find another access at this time. [place], APP with service notified of this-asked her if further access with midline/PICC line would be appropriate.   Also pt anxious and asking for Klonopin , pt wants to wait until after stomach settles from test to take other AM meds. APP notified of this as well and would like pt to take antibiotic if possible and the team will be rounding on patient soon.

## 2024-02-19 NOTE — Nurses Notes (Signed)
 Pt declines AVS being reviewed with her sister, Ashley RAMAN.

## 2024-02-19 NOTE — Care Plan (Signed)
 [facility]  Spiritual Care Note    Patient Name:  Holly Fritz  Date of Encounter:   02/19/2024    I visited with Exie, facilitating storytelling and grief and exploring emotions as she shared her health journey and the losses of multiple family members over the last few years.  These events have lead her to question God and her life's purpose.  She shared I have been having passive SI for two years.  She shared she didn't want to die, but in her condition she would only live a few more weeks without nutrition, and had begun to write notes to loved ones.  I relayed this information to [employer].  I listened and empathized, and invited Heily her to continue to question God and explore her life's purpose.  She shared that she loves hugs, and RN, MD, and I gathered with Jirah for a group hug.  I shared that Spiritual Care remains available 24/7.       02/19/24 1413   Clinical Encounter Type   Reason for Visit Patient/Person/Family Request   Referral From RN/LPN   Visited With Patient   Patient Spiritual Encounters   Spiritual Needs/Issues Weariness;Suffering;Powerlessness;Image of God/Sacred;Loss of Meaning;Loss of Faith or Trust in [place];Hopelessness;Helplessness;Grief;Despair   Spiritual/Coping Resources Loved/supported by family;Beliefs in God/Sacred/Higher Purpose  (sister)   Interpersonal/Family Stressors Distance from home (list distance);Financial;Loss of (Comment)  (loss of mother, grandmother, and divorce w/i last 5 years)   Coping Provided supportive presence;Offered empathy;Facilitated story telling;Facilitated spiritual reflection;Facilitated grief;Explored emotions   Other Support Services Provided Consulted with Interdisciplinary Team;Non-anxious presence   Information/Education Provided Spiritual Care scope of service   Spiritual Care outcomes with Patient   Spiritual/Emotional Processing  Connected to spiritual support;Patient processed emotions;Patient shared/processed  his/her/their story;Spiritual Care relationship established    Patient Coping More peaceful   Mutuality/Individual Preferences   Anxieties, Fears or Concerns longterm plan of care?   Plan of Care Reviewed With patient   Time of Encounters   Start Time 1150   Stop Time 1413   Duration (minutes) 143 Minutes         Glennda Perna, [facility]  Pager: Chaplain on Call  Total Time of Encounter: 150 min.

## 2024-02-19 NOTE — Nurses Notes (Signed)
 Service in to see patient. MD okay with patient not having IV access at this time. Is awaiting test result from today. Will continue to monitor.

## 2024-02-19 NOTE — Nurses Notes (Addendum)
 Patient being discharged home. States she lives with her sister. Belongings with patient. IV was removed earlier this AM. Pt has belongings with her. AVS reviewed with patient, questions encouraged and answered. Pt has follow up therapy appointment tomorrow and is aware of future follow ups. Pt just waiting on ride to arrive with [facility]

## 2024-02-19 NOTE — Nurses Notes (Signed)
 Larraine [place], APP with service asked via secure chat if pt can have AM meds with sip of water  since she is NPO this am.     Larraine asked if meds could be given after test-I said yes but pt may just miss a dose of Lyrica  r/t its TID and may be too close to next dose. Larraine said that was okay. Will just hold off on AM meds until after testing.

## 2024-02-19 NOTE — Discharge Summary (Signed)
 [facility]  DISCHARGE SUMMARY    PATIENT NAME:  Holly, Fritz  MRN:  [id number]  DOB:  1981/02/10    ENCOUNTER DATE:  02/15/2024  INPATIENT ADMISSION DATE:   DISCHARGE DATE:  02/19/2024    ATTENDING PHYSICIAN: Viviano Powers, MD  SERVICE: HOSPITALIST 4  PRIMARY CARE PHYSICIAN: Chad Taylor Hott, APRN, CNP   Has PCP been verified with patient and updated? Yes    Lay Caregiver Name: Callen Zuba Caregiver Contact Number: [phone]   Lay Caregiver Relationship to patient: sibling    PRIMARY DISCHARGE DIAGNOSIS: Feeding tube dysfunction  Active Hospital Problems    Diagnosis Date Noted    Principal Problem: Feeding tube dysfunction [T85.598A] 02/15/2024    Pain of jejunostomy tube site (CMS [facility]) [K94.19] 02/17/2024    Abdominal pain, unspecified abdominal location [R10.9] 02/15/2024      Resolved Hospital Problems   No resolved problems to display.     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        Details   amoxicillin -pot clavulanate 200-28.5 mg/5 mL Suspension for Reconstitution  Commonly known as: AUGMENTIN    Take 21.9 mL (875 mg total) by mouth Twice daily for 2 days (DISCARD REMAINDER)  Qty: 100 mL  Refills: 0     Daily-Vite (with folic acid ) 400 mcg Tablet  Generic drug: multivitamin with folic acid   Start taking on: February 20, 2024   1 Tablet, Oral, Daily  Qty: 30 Tablet  Refills: 0     dicyclomine  10 mg Capsule  Commonly known as: BENTYL    10 mg, Oral, EVERY 6 HOURS PRN  Qty: 30 Capsule  Refills: 0     pregabalin  100 mg  Capsule  Commonly known as: LYRICA    100 mg, Oral, 3 TIMES DAILY  Qty: 90 Capsule  Refills: 0            CONTINUE these medications - NO CHANGES were made during your visit.        Details   acetaminophen  500 mg Tablet  Commonly known as: TYLENOL    500 mg, EVERY 4 HOURS PRN  Refills: 0     albuterol  sulfate 90 mcg/actuation oral inhaler  Commonly known as: PROVENTIL  or VENTOLIN  or PROAIR    2 Puffs, Inhalation, EVERY 4 HOURS PRN  Qty: 3 Each  Refills: 1     cetirizine  10 mg Tablet  Commonly known as: zyrTEC    1 Tablet, 2 TIMES DAILY  Refills: 0     clobetasol-gauze-silicone 0.05 %- 4 X 4 Kit   Apply topically as needed Indications: skin rash   Refills: 0     clonazePAM  0.5 mg Tablet  Commonly known as: klonoPIN    1 Tablet, 3 TIMES DAILY  Refills: 0     cyclobenzaprine  10 mg Tablet  Commonly known as: FLEXERIL    1 Tablet, EVERY 8 HOURS PRN  Refills: 0     diphenhydrAMINE  25 mg Capsule  Commonly known as: BENADRYL   1 Capsule, DAILY PRN  Refills: 0     famotidine  40 mg Tablet  Commonly known as: PEPCID    40 mg, Oral, 2 TIMES DAILY PRN  Qty: 180 Tablet  Refills: 0     hydrOXYzine  HCL 50 mg Tablet  Commonly known as: ATARAX    1 Tablet, 2 TIMES DAILY  Refills: 0     hyoscyamine  sulfate 0.125 mg Tablet, Sublingual  Commonly known as: LEVSIN /SL   0.25 mg, Sublingual, 3 TIMES DAILY PRN  Qty: 180 Tablet  Refills: 1     lamotrigine  100 mg Tablet  Commonly known as: LAMICTAL    1 Tablet, 2 TIMES DAILY  Refills: 0     * lidocaine  5 % Ointment  Commonly known as: XYLOCAINE    APPLY TO AFFECTED AREA 1-4 TIMES DAILY AS NEEDED  Refills: 0     * lidocaine  5 % Adhesive Patch, Medicated  Commonly known as: LIDODERM    APPLY 1 PATCH BY TOPICAL ROUTE ONCE DAILY (MAY WEAR UP TO 12HOURS.)  Refills: 0     omeprazole  40 mg Capsule, Delayed Release(E.C.)  Commonly known as: PRILOSEC   40 mg, Oral, Daily, 30 min before breakfast.  Ok to open capsule and take in applesauce.  Qty: 90 Capsule  Refills: 1     ondansetron  4 mg Tablet, Rapid  Dissolve  Commonly known as: ZOFRAN  ODT   4 mg, Oral, EVERY 8 HOURS PRN  Qty: 12 Tab  Refills: 0     SIMETHICONE  ORAL   1 Tablet, DAILY PRN  Refills: 0     Tacrolimus 0.1 % Ointment   2 TIMES DAILY PRN  Refills: 0     traZODone  50 mg Tablet  Commonly known as: DESYREL    1 Tablet, NIGHTLY PRN  Refills: 0     turmeric 400 mg Capsule   1 Capsule, Daily  Refills: 0           * This list has 2 medication(s) that are the same as other medications prescribed for you. Read the directions carefully, and ask your doctor or other care provider to review them with you.                STOP taking these medications.      metoclopramide  HCl 5 mg Tablet  Commonly known as: REGLAN             Discharge med list refreshed?  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    The patient underwent J-tube removal on 1/5 due to intolerance and pain, with subsequent improvement in abdominal pain. She was treated for a small peristomal subcutaneous abscess with oral Augmentin , with clinical improvement and no systemic signs of infection. She underwent MBSS, which demonstrated a safe oropharyngeal swallow without aspiration but esophageal retention; subsequent barium esophagram was normal, without evidence of structural abnormality or significant dysmotility. Her pain was managed with  an opioid-sparing regimen, oral intake observed to have ongoing bloating, nausea, early satiety,  and at times emesis.    Given clinical stability, imaging, and established outpatient follow-up needs, she is appropriate for discharge.      TRANSITION/POST DISCHARGE CARE/PENDING TESTS/REFERRALS:   Gastroparesis / chronic GI dysmotility  - Barium esophagram normal  - Continue regular diet as tolerated with small, frequent, low-fat, low-fiber meals  - Encourage upright positioning during and after meals  - Strongly encouraged to obtain and start prucalopride as an outpatient for pro-motility therapy  - Follow up with PCP/Gastroenterology        CONDITION ON DISCHARGE:  A. Ambulation: Full ambulation  B. Self-care Ability: Complete  C. Cognitive Status Alert and Oriented x 3  D. Code status at discharge: Full code      LINES/DRAINS/WOUNDS AT DISCHARGE:   Patient Lines/Drains/Airways Status       Active Line / Dialysis Catheter / Dialysis Graft / Drain / Airway / Wound       None                    DISCHARGE DISPOSITION:  Home discharge  DISCHARGE INSTRUCTIONS:  Post-Discharge Follow Up Appointments       Monday Feb 23, 2024    [facility] MRI with [facility] at  9:30 AM      Tuesday Feb 24, 2024    New Patient Visit with Barbaraann Cough, MD at  2:30 PM      Wednesday Feb 25, 2024    Go to Hott, Chad Taylor, APRN, CNP    Phone: [phone]    Where: [address], [place] [facility] [address]      Tuesday Apr 20, 2024    Return Patient Visit with Fannie Domino, APRN, CNP at  2:30 PM      Thursday May 06, 2024    Return Patient Visit with Joshua Console, MD at  3:15 PM      ENT, Physician [facility], [facility]  [address]  [place] [address]  [phone] Gastroenterology/Hepatology, Physician [facility]  Physician Office [place], [facility]  [address]  [place] [address]  [phone] Imaging Services, [facility], [place]  [address]  [place] [address]  [phone]    Neurology, Physician [facility]  Physician [facility], [facility]  [address]  [place] [address]  [phone]          No discharge procedures on file.       Holly Screen, MD    Copies sent to Care Team         Relationship Specialty Notifications Start End    Hott, Chad Taylor, APRN, [place] PCP - General FAMILY NURSE PRACTITIONER  12/07/21     Phone: [phone] Fax: [phone]         [address] [place] [address]            Referring providers can utilize [email or web address] to access their referred [facility] Medicine patient's information.                               [1]   Allergies  Allergen Reactions    Adhesive Rash    Doxycycline Rash     Agitation      Propranolol Hives/ Urticaria, Itching and Rash    Ciprofloxacin  Gabapentin   Other Adverse Reaction (Add comment)     Patient reports is sensitive to medication.    Levaquin [Levofloxacin]     Penicillins      unknown reaction when an infant - HAS TOLERATED KEFLEX     Tapentadol      Patient reports sensitivity to medication.

## 2024-02-19 NOTE — Progress Notes (Signed)
 [facility]  Medicine Progress Note    Holly Fritz  Date of service: 02/19/2024  Date of Admission:  02/15/2024    Hospital Day:  LOS: 0 days     CC: Follow up   Interval History: Ongoing difficulty with oral intake tolerance. Barium esophagram negative.       Medications:   Current Facility-Administered Medications   Medication Dose Route Frequency    acetaminophen  (TYLENOL ) tablet  650 mg Oral Q4H PRN    amoxicillin -clavulanate (AUGMENTIN ) 200-28.5 mg per 5 mL oral liquid  875 mg Oral 2x/day    clonazePAM  (klonoPIN ) tablet  0.5 mg Oral 3x/day PRN    cyclobenzaprine  (FLEXERIL ) tablet  10 mg Oral Q8H PRN    D5W 250 mL flush bag   Intravenous Q15 Min PRN    dicyclomine  (BENTYL ) capsule  10 mg Oral Q6H PRN    electrolyte-R pH 7.4 (NORMOSOL-R pH 7.4) premix infusion   Intravenous Continuous    hydrOXYzine  (ATARAX ) 10mg  per 5mL oral liquid  50 mg Oral 3x/day PRN    lamoTRIgine  (LAMICTAL ) tablet  100 mg Oral 2x/day    multivitamin (THERA) tablet  1 Tablet Oral Daily    NS 250 mL flush bag   Intravenous Q15 Min PRN    NS flush syringe  2-6 mL Intracatheter Q8HRS    NS flush syringe  2-6 mL Intracatheter Q1 MIN PRN    ondansetron  (ZOFRAN  ODT) rapid dissolve tablet  4 mg Oral Q6H PRN    ondansetron  (ZOFRAN ) 2 mg/mL injection  4 mg Intravenous Q8H PRN    pantoprazole  (PROTONIX ) 40 mg in NS 10 mL injection  40 mg Intravenous Daily    pregabalin  (LYRICA ) capsule  100 mg Oral 3x/day    sennosides-docusate sodium  (SENOKOT-S) 8.6-50mg  per tablet  1 Tablet Oral 2x/day    simethicone  (MYLICON) chewable tablet  80 mg Oral Q6H PRN         Vital Signs:  Temp  Avg: 36.7 C (98.1 F)  Min: 36.6 C (97.9 F)  Max: 36.8 C (98.2 F)    Pulse  Avg: 67  Min: 63  Max: 72 BP  Min: 118/86  Max: 148/90   Resp  Avg: 16  Min: 14  Max: 17 SpO2  Avg: 99 %  Min: 96 %  Max: 100 %            Physical Exam:  General Appearance: Emotionally distressed  Eyes: Conjunctiva clear, pupils equal and round, sclera non-icteric, EOMI  ENT: Mucous  membranes moist  Neck: Supple, trachea midline  Lungs: Clear to auscultation bilaterally, no wheezes or crackles, breathing comfortably  Heart: S1S2, RRR, no murmur     Abdomen: Soft  Extremities/MSK: No peripheral edema  Integumentary:  Skin warm and dry, no rashes or visible lesions  Neurologic: Grossly normal, alert and oriented, no focal neurologic deficits  Psychiatric: Mood/affect appropriate, speech normal      Labs:  I reviewed labs    Radiology:  I reviewed imaging       PT/OT: Yes    Consults: No        Assessment/ Plan:   Active Hospital Problems    Diagnosis    Primary Problem: Feeding tube dysfunction    Pain of jejunostomy tube site (CMS HCC)    Abdominal pain, unspecified abdominal location     44 year old female with Holly Fritz and 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; J-tube removed on 1/5  - MBSS completed: safe oropharyngeal swallow with no aspiration; esophageal retention noted  - Barium esophagram negative  - Continue regular diet as tolerated with aspiration precautions  - Encourage small bites, slow rate, upright positioning during and after meals     J-tube-associated small subcutaneous abscess, improving  - CT showed small subcutaneous abscess (8  4 mm) without intra-abdominal involvement  - Clinically improving; afebrile, no leukocytosis  - Continue Augmentin  oral suspension  - Total antibiotic duration: 5 days  - Monitor for recurrent pain, erythema, drainage, or fevers     Pain management  - Lyrica  increased to 100 mg TID for neuropathic/visceral pain  - Acetaminophen  PRN  - Ketorolac  PRN (short course only)  - Avoid opioids  - Dicyclomine  PRN for crampy/spasm-type abdominal pain     Nausea  - Ondansetron  PRN  - Pantoprazole  daily      Severe protein-calorie malnutrition  - Oral intake improving but limited by dysmotility symptoms  - Dietitian following for caloric adequacy and gastroparesis-friendly diet  - Maintenance IV fluids as needed while PO intake remains suboptimal  - MNT PROTOCOL FOR DIETITIAN  - DIET REGULAR (RUBY/FMT) Adult Consistency/thickening: Solid: LEVEL 7: Easy to Chew  - DIET NPO - SPECIFIC DATE & TIME    - Additional clinical characteristics related to nutrition:   - monitor for weight changes  - monitor intake and output   - monitor bowel functions     Anemia of chronic inflammation  - Iron  studies: ferritin 165 with low iron , low TIBC, low transferrin, iron  saturation 14%  - Consistent with anemia of chronic inflammation rather than iron  deficiency  - No iron  supplementation indicated at this time  - Continue to monitor CBC     Mood / coping  - Patient has expressed distress related to chronic illness and nutrition uncertainty; denies suicidal ideation, intent, or plan  - Reassurance provided; patient engaged with care plan  - Continue supportive care; consider Social Work or Psychiatry if distress persists     Holly Screen, MD  Attending Physician - Hospitalist  Department of Internal Medicine    On 02/19/2024 I spent a total visit time of 54 minutes. Time included review of tests and ordering tests, obtaining/reviewing history, examining the patient, communicating with consultants, documenting clinical information and counseling the patient and/or family regarding the diagnosis and management plan and coordination of care involved services directly related to patient care.    FOLLOW UP NOTE LEVEL 3 (TOTAL TIME > 50 MINUTES) ([id number])

## 2024-02-19 NOTE — Care Management Notes (Signed)
 [facility]  Care Management Note    Patient Name: Holly Fritz  Date of Birth: [birth year]  Sex: female  Date/Time of Admission: 02/15/2024  3:29 PM  Room/Bed: 16/A  Payor: [place] / Plan: [employer] [employer] / Product Type: Medicaid [place] /    LOS: 0 days   Primary Care Providers:  Hott, Chad Taylor, APRN, CNP, APRN* (General)    Admitting Diagnosis:  Feeding tube dysfunction [T85.598A]    Assessment:      02/19/24 1429   Assessment Details   Assessment Type Continued Assessment   Date of Care Management Update 02/19/24   Date of Next DCP Update 02/20/24   Insurance Information/Type   Insurance type Medicaid  ([facility])   Home Safety   Home Assessment: *No Transportation Available   Care Management Plan   Discharge Planning Status plan in progress   Projected Discharge Date 02/19/24   CM will evaluate for rehabilitation potential yes   Discharge Needs Assessment   Discharge Facility/Level of Care Needs Home (Patient/Family Member/other)(code 1)   Transportation Available Medicaid transportation   Transportation Arranged [employer]       MSW was notified by Hospitalist 4 that patient is ready for discharge today and will need transportation to her home address.     MSW created task to request [employer] transportation.    Discharge Plan:  Home (Patient/Family Member/other) (code 1)    The patient will continue to be evaluated for developing discharge needs.     Case Manager: Darryle Sox, SOCIAL WORKER  Phone: [phone]

## 2024-02-19 NOTE — Nurses Notes (Signed)
 Pt asking to talk with pastoral care. This nurse notified pastoral care and someone will be up to talk with patient.

## 2024-02-19 NOTE — Nurses Notes (Addendum)
 2053- Pt discharged to home via uber ride called by family after refusing [employer]. AVS and pt belongings with patient. Care management attempted to be contacted to cancel [employer] however pagers and phone numbers are unavailable.     2106- [employer] contacted and ride canceled.

## 2024-02-20 ENCOUNTER — Telehealth (HOSPITAL_COMMUNITY): Payer: Self-pay

## 2024-02-20 NOTE — Telephone Encounter (Signed)
 Transition of Care Contact Information  Discharge Date: 02/19/2024  Transition Facility Type--Hospital (Inpatient or Observation)  Facility Name-- [facility]  Interactive Contact(s): Completed or attempted contact indicated by Date/Time  First Attempt Call: 02/20/2024  3:49 PM  Second Attempted Contact: 02/20/2024  3:50 PM  Third Attempted Contact: 02/20/2024  3:52 PM  Contact Method(s)-- MyChart Patient Portal, Patient/Caregiver Telephone  Clinical Staff Name/Role who contacted--Tarig Zimmers Transitional Care Coordiantor LPN  Transition Note:Attempted hospital discharge follow up call- no answer noted at this time, voicemail left with Coordinators' contact information requesting a call back on patient's [phone] [facility]) and patient' sister [phone] [facility]) . I also sent a my chart message.          Clarita Lanius, LPN Transitional Care Coordinator:  Ext: [phone]

## 2024-02-23 ENCOUNTER — Ambulatory Visit: Admission: RE | Admit: 2024-02-23 | Discharge: 2024-02-23

## 2024-02-23 ENCOUNTER — Other Ambulatory Visit: Payer: Self-pay

## 2024-02-23 DIAGNOSIS — R1907 Generalized intra-abdominal and pelvic swelling, mass and lump: Secondary | ICD-10-CM | POA: Insufficient documentation

## 2024-02-23 MED ORDER — GADOTERIDOL 279.3 MG/ML INTRAVENOUS SOLUTION: 9.0000 mL | INTRAVENOUS | Status: AC

## 2024-02-24 ENCOUNTER — Ambulatory Visit (INDEPENDENT_AMBULATORY_CARE_PROVIDER_SITE_OTHER): Payer: Self-pay | Admitting: NEUROLOGY

## 2024-02-24 DIAGNOSIS — D252 Subserosal leiomyoma of uterus: Secondary | ICD-10-CM

## 2024-02-24 DIAGNOSIS — N852 Hypertrophy of uterus: Secondary | ICD-10-CM

## 2024-02-24 DIAGNOSIS — N83201 Unspecified ovarian cyst, right side: Secondary | ICD-10-CM

## 2024-02-24 DIAGNOSIS — R188 Other ascites: Secondary | ICD-10-CM

## 2024-02-25 ENCOUNTER — Ambulatory Visit (INDEPENDENT_AMBULATORY_CARE_PROVIDER_SITE_OTHER): Payer: Self-pay

## 2024-03-01 ENCOUNTER — Telehealth (HOSPITAL_COMMUNITY): Payer: Self-pay | Admitting: "Technician

## 2024-03-01 NOTE — Telephone Encounter (Signed)
 Holly Fritz states her feeding tube has been removed after being evaluated at [facility] ED and transfer to [facility]. She states she didn't finish her antibiotic/medications because she developed a rash. Alleta voices she still eats very little daily as it cause stomach pain.    She states it is recommended she see specialists at [facility] [place] . Alec is concerned about transportation and length of stay. Advised to reach out to [employer] and facilities in [place]  for additional resources.    She states she has appointments to see her therapist and psychiatrist this month. She would like to talk with a dietician but was told she would only have one visit. Explained that referrals are usually required.    Verdelle plans to donate equipment for her feeding tube. She has not attempted to volunteer with the homeless coalition yet due to her health.    Advised to contact PCP with concern of wound healing. Advised to contact Preventive Medicine for resources as needed. Closing this referral/provider notified. Leeroy Punches CHW [facility] Preventive Medicine

## 2024-03-02 ENCOUNTER — Encounter (INDEPENDENT_AMBULATORY_CARE_PROVIDER_SITE_OTHER): Payer: Self-pay

## 2024-03-10 ENCOUNTER — Ambulatory Visit: Payer: Self-pay

## 2024-03-10 ENCOUNTER — Other Ambulatory Visit: Payer: Self-pay

## 2024-03-10 ENCOUNTER — Encounter (INDEPENDENT_AMBULATORY_CARE_PROVIDER_SITE_OTHER): Payer: Self-pay

## 2024-03-10 ENCOUNTER — Other Ambulatory Visit (HOSPITAL_COMMUNITY): Payer: Self-pay | Admitting: Family

## 2024-03-10 VITALS — BP 102/78 | HR 92 | Temp 97.7°F | Resp 16 | Wt 100.0 lb

## 2024-03-10 DIAGNOSIS — K3184 Gastroparesis: Secondary | ICD-10-CM | POA: Insufficient documentation

## 2024-03-10 DIAGNOSIS — K912 Postsurgical malabsorption, not elsewhere classified: Secondary | ICD-10-CM | POA: Insufficient documentation

## 2024-03-10 DIAGNOSIS — N3946 Mixed incontinence: Secondary | ICD-10-CM | POA: Insufficient documentation

## 2024-03-10 DIAGNOSIS — N8000 Endometriosis of the uterus, unspecified: Secondary | ICD-10-CM | POA: Insufficient documentation

## 2024-03-10 DIAGNOSIS — R1900 Intra-abdominal and pelvic swelling, mass and lump, unspecified site: Secondary | ICD-10-CM | POA: Insufficient documentation

## 2024-03-10 DIAGNOSIS — E041 Nontoxic single thyroid nodule: Secondary | ICD-10-CM

## 2024-03-10 DIAGNOSIS — K224 Dyskinesia of esophagus: Secondary | ICD-10-CM | POA: Insufficient documentation

## 2024-03-10 DIAGNOSIS — D251 Intramural leiomyoma of uterus: Secondary | ICD-10-CM | POA: Insufficient documentation

## 2024-03-10 DIAGNOSIS — E440 Moderate protein-calorie malnutrition: Secondary | ICD-10-CM | POA: Insufficient documentation

## 2024-03-10 DIAGNOSIS — R102 Pelvic and perineal pain unspecified side: Secondary | ICD-10-CM | POA: Insufficient documentation

## 2024-03-10 DIAGNOSIS — N809 Endometriosis, unspecified: Secondary | ICD-10-CM | POA: Insufficient documentation

## 2024-03-10 DIAGNOSIS — N393 Stress incontinence (female) (male): Secondary | ICD-10-CM | POA: Insufficient documentation

## 2024-03-10 DIAGNOSIS — Z01818 Encounter for other preprocedural examination: Secondary | ICD-10-CM | POA: Insufficient documentation

## 2024-03-10 DIAGNOSIS — Z87898 Personal history of other specified conditions: Secondary | ICD-10-CM | POA: Insufficient documentation

## 2024-03-10 DIAGNOSIS — Z681 Body mass index (BMI) 19 or less, adult: Secondary | ICD-10-CM | POA: Insufficient documentation

## 2024-03-10 NOTE — Progress Notes (Signed)
 UROGYNECOLOGY, [address] [facility] [facility]  [address] [place] [address]  Operated by [facility]         Name: Holly Fritz MRN:  [id number]   Date: 03/10/2024 Age: 44 y.o.        HPI Comments:    History of Present Illness  Holly Fritz is a 44 year old female with uterine fibroids, endometriosis, and mixed urinary incontinence who presents for follow-up of chronic pelvic pain and abnormal uterine bleeding.  I questioned the prior performance of endometrial ablation as menorrhagia is problematic.  This however may be related to the fibroid.    She reports daily, severe pelvic pain for approximately twenty years, most prominent in the left lower abdomen with a burning quality. She associates the pain with her left ovary but has difficulty distinguishing ovarian from uterine pain. The pain is persistent and has significantly impaired her quality of life.    Menstrual bleeding is heavy for two to three days per cycle, with menses lasting approximately seven days. Abnormal uterine bleeding has persisted despite prior endometrial ablation performed fifteen years ago, and she expresses concern regarding ongoing bleeding post-ablation.    She has multiple uterine fibroids, including a large subserosal fibroid measuring eight centimeters and additional smaller fibroids identified on pelvic MRI from January 12th. She was previously unaware of the extent of her fibroid burden. Prior treatment with a six-month course of leuprolide acetate did not result in fibroid reduction and was poorly tolerated. She previously underwent laparoscopic myomectomy with pathologic confirmation of fibroid removal. A vaginal ultrasound two years ago did not reveal the current fibroid burden.    She has endometriosis with prior surgical intervention and a potential redo- due to complications from initial surgery. She believes pelvic scar tissue may contribute to her symptoms. She describes  nabothian cysts identified on multiple ultrasounds and is aware of free fluid and septations in the pelvis noted on recent imaging. No concerning ovarian masses have been identified.  Septations may be related to scar tissue from prior interventions.  These are exclusive from the ovary.    She experiences mixed urinary incontinence, described as leakage primarily upon standing and exacerbated by coughing or laughing. She voids prior to standing to minimize leakage.    I previously recommended Reglan  therapy for the mild gastroparesis and the mild dysmotility, her percutaneous endoscopic gastrostomy tube has been removed.  She is hesitant about utilizing Reglan  secondary to the side-effects but I believe in this case considering her body mass index and underlying infirmity that it would be warranted.       Chief Complaint   Patient presents with    Follow Up     Patient is here for a follow up on a MRI she got. Patient got MRI on the 12 of January.       Past Medical History:   Diagnosis Date    Autoimmune disorder (CMS HCC)     Awareness under anesthesia     Cervicocranial syndrome     Chiari I malformation (CMS HCC)     Chronic pain     Convergence insufficiency     Dysautonomia (CMS HCC)     Ehlers-Danlos syndrome     Endometriosis     Esophageal reflux     H/O pelvic mass 03/10/2024    Headache     Hearing loss 2022    History of anesthesia complications     patient states make sure you give me enough  medication to go to sleep and stay asleep or I will wake up during procedure    Neck problem     POTS (postural orthostatic tachycardia syndrome)     Scoliosis     Shortness of breath     Thyroid  nodule     Wears glasses            Past Surgical History:   Procedure Laterality Date    ESOPHAGEAL DILATION      HX BREAST BIOPSY      HX CHOLECYSTECTOMY      HX COLONOSCOPY      HX LAP CHOLECYSTECTOMY      HX MYOMECTOMY      HX OTHER      HX WISDOM TEETH EXTRACTION      POSTERIOR FOSSA DECOMPRESSION      2017            Social History[1]     Outpatient Medications Marked as Taking for the 03/10/24 encounter (Office Visit) with Elouise Mungo, DO   Medication Sig    acetaminophen  (TYLENOL ) 500 mg Oral Tablet Take 1 Tablet (500 mg total) by mouth Every 4 hours as needed for Pain    albuterol  sulfate (PROVENTIL  OR VENTOLIN  OR PROAIR ) 90 mcg/actuation Inhalation oral inhaler Take 2 Puffs by inhalation Every 4 hours as needed Indications: bronchospasm prevention    amoxicillin -pot clavulanate (AUGMENTIN ) 200-28.5 mg/5 mL Oral Suspension for Reconstitution Take 21.9 mL (875 mg total) by mouth Twice daily for 2 days (DISCARD REMAINDER)    cetirizine  (ZYRTEC ) 10 mg Oral Tablet Take 1 Tablet (10 mg total) by mouth Twice daily Indications: inflammation of the nose due to an allergy    clobetasoL-gauze-silicone 0.05 %- 4 X 4 Apply externally Kit Apply topically as needed Indications: skin rash     clonazePAM  (KLONOPIN ) 0.5 mg Oral Tablet Take 1 Tablet (0.5 mg total) by mouth Three times a day Indications: panic disorder    cyclobenzaprine  (FLEXERIL ) 10 mg Oral Tablet Take 1 Tablet (10 mg total) by mouth Every 8 hours as needed for Muscle spasms Indications: muscle spasm    dicyclomine  (BENTYL ) 10 mg Oral Capsule Take 1 Capsule (10 mg total) by mouth Every 6 hours as needed    diphenhydrAMINE  (BENADRYL ) 25 mg Oral Capsule Take 1 Capsule (25 mg total) by mouth Once per day as needed for Itching Indications: an allergic reaction    famotidine  (PEPCID ) 40 mg Oral Tablet Take 1 tablet by mouth twice daily as needed    hydrOXYzine  HCL (ATARAX ) 50 mg Oral Tablet Take 1 Tablet (50 mg total) by mouth Twice daily Indications: anxious    hyoscyamine  sulfate (LEVSIN /SL) 0.125 mg Sublingual Tablet, Sublingual Place 2 Tablets (0.25 mg total) under the tongue Three times a day as needed    lamoTRIgine  (LAMICTAL ) 100 mg Oral Tablet Take 1 Tablet (100 mg total) by mouth Twice daily Indications: bipolar depression    lidocaine  (LIDODERM ) 5 %  Adhesive Patch, Medicated APPLY 1 PATCH BY TOPICAL ROUTE ONCE DAILY (MAY WEAR UP TO 12HOURS.)    lidocaine  (XYLOCAINE ) 5 % Ointment APPLY TO AFFECTED AREA 1-4 TIMES DAILY AS NEEDED    multivitamin with folic acid  (THERA) 400 mcg Oral Tablet Take 1 Tablet by mouth Daily    omeprazole  (PRILOSEC) 40 mg Oral Capsule, Delayed Release(E.C.) Take 1 Capsule (40 mg total) by mouth Daily 30 min before breakfast.  Ok to open capsule and take in applesauce.    ondansetron  (ZOFRAN  ODT) 4 mg Oral Tablet,  Rapid Dissolve Take 1 Tab (4 mg total) by mouth Every 8 hours as needed for Nausea/Vomiting    pregabalin  (LYRICA ) 100 mg Oral Capsule Take 1 Capsule (100 mg total) by mouth Three times a day (Patient taking differently: Take 25 mg by mouth Three times a day)    SIMETHICONE  ORAL Take 1 Tablet by mouth Once per day as needed    Tacrolimus 0.1 % Ointment Apply topically Twice per day as needed Indications: a type of allergy that causes red and itchy skin called atopic dermatitis     traZODone  (DESYREL ) 50 mg Oral Tablet Take 1 Tablet (50 mg total) by mouth Every night as needed for Insomnia Indications: insomnia associated with depression    turmeric 400 mg Oral Capsule Take 1 Capsule (400 mg total) by mouth Daily Indications: inflammation        Allergies[2]    Family Medical History:       Problem Relation (Age of Onset)    COPD Paternal Grandmother    Congestive Heart Failure Maternal Grandmother    Hypertension (High Blood Pressure) Mother    No Known Problems Father, Sister, Maternal Grandfather, Paternal Grandfather, Daughter, Son, Maternal Aunt, Maternal Uncle, Paternal Aunt, Paternal Uncle, Other    Non-Hodgkin's lymphoma Maternal Grandmother    Primary Brain tumor Mother              OB History   Gravida Para Term Preterm AB Living   0 0 0      SAB IAB Ectopic Multiple Live Births               Objective:  Vitals:    03/10/24 1040   BP: 102/78   Pulse: 92   Resp: 16   Temp: 36.5 C (97.7 F)   TempSrc: Tympanic   SpO2: 98%    Weight: 45.4 kg (100 lb)        Review of systems: negative unless otherwise stated in HPI  Results  Radiology  Pelvis MRI with and without contrast (02/23/2024): 8 cm subserosal uterine fibroid; 1.6 cm intramural uterine fibroid; additional fibroids in posterior fundus, posterior uterine body, and right posterior uterine body; endometrium not thickened; right ovary 4.7 x 2.7 x 1.6 cm; left ovary 3.7 x 1.8 x 2.4 cm; no suspicious ovarian mass; small volume of complex free pelvic fluid with septations not involving ovaries; no features concerning for malignancy.     Results for orders placed or performed during the hospital encounter of 02/15/24 (from the past 12 weeks)   CBC/DIFF    Collection Time: 02/16/24 10:33 AM    Narrative    The following orders were created for panel order CBC/DIFF.  Procedure                               Abnormality         Status                     ---------                               -----------         ------                     CBC WITH IPQQ[[id number]]  Final result                 Please view results for these tests on the individual orders.   BASIC METABOLIC PANEL    Collection Time: 02/16/24 10:33 AM   Result Value Ref Range    SODIUM 142 136 - 145 mmol/L    POTASSIUM 3.6 3.5 - 5.1 mmol/L    CHLORIDE 110 96 - 111 mmol/L    CO2 TOTAL 25 22 - 30 mmol/L    ANION GAP 7 4 - 13 mmol/L    CALCIUM 8.9 8.6 - 10.2 mg/dL    GLUCOSE 82 65 - 874 mg/dL    BUN 10 8 - 25 mg/dL    CREATININE 9.28 9.39 - 1.05 mg/dL    eGFRcr - FEMALE >09 >=60 mL/min/1.75m2    BUN/CREA RATIO 14 6 - 22   PHOSPHORUS    Collection Time: 02/16/24 10:33 AM   Result Value Ref Range    PHOSPHORUS 2.1 (L) 2.4 - 4.7 mg/dL   MAGNESIUM    Collection Time: 02/16/24 10:33 AM   Result Value Ref Range    MAGNESIUM 2.0 1.8 - 2.6 mg/dL   C-REACTIVE PROTEIN(CRP),INFLAMMATION    Collection Time: 02/16/24 10:33 AM   Result Value Ref Range    CRP INFLAMMATION 0.4 <8.0 mg/L   CBC WITH DIFF     Collection Time: 02/16/24 10:33 AM   Result Value Ref Range    WBC 6.1 3.7 - 11.0 x103/uL    RBC 4.26 3.85 - 5.22 x106/uL    HGB 13.3 11.5 - 16.0 g/dL    HCT 62.1 65.1 - 53.9 %    MCV 88.7 78.0 - 100.0 fL    MCH 31.2 26.0 - 32.0 pg    MCHC 35.2 31.0 - 35.5 g/dL    RDW-CV 87.2 88.4 - 84.4 %    PLATELETS 337 150 - 400 x103/uL    MPV 9.2 8.7 - 12.5 fL    NEUTROPHIL % 63.1 %    LYMPHOCYTE % 26.4 %    MONOCYTE % 8.3 %    EOSINOPHIL % 1.3 %    BASOPHIL % 0.7 %    NEUTROPHIL # 3.88 1.50 - 7.70 x103/uL    LYMPHOCYTE # 1.62 1.00 - 4.80 x103/uL    MONOCYTE # 0.51 0.20 - 1.10 x103/uL    EOSINOPHIL # <0.10 <=0.50 x103/uL    BASOPHIL # <0.10 <=0.20 x103/uL    IMMATURE GRANULOCYTE % 0.2 0.0 - 1.0 %    IMMATURE GRANULOCYTE # <0.10 <0.10 x103/uL   HCG, PLASMA OR SERUM QUANTITATIVE    Collection Time: 02/16/24 10:33 AM   Result Value Ref Range    HCG QUANTITATIVE <2 <10 mIU/mL   HCG, URINE QUALITATIVE, PREGNANCY    Collection Time: 02/16/24 11:04 AM   Result Value Ref Range    HCG URINE QUALITATIVE Indeterminate (A) Negative    SPECIFIC GRAVITY 1.008 1.005 - 1.030    Narrative    The pregnancy test shows an indeterminate result, suggesting an hCG level of less than 20 mIU hCG/mL.  Repeated analysis in 48 hours is recommended.  If this is not medically feasible, a quantitative serum hCG test should be performed.   HCG, PLASMA OR SERUM QUANTITATIVE    Collection Time: 02/16/24  6:32 PM   Result Value Ref Range    HCG QUANTITATIVE <2 <10 mIU/mL   BASIC METABOLIC PANEL    Collection Time: 02/17/24  5:26 AM   Result Value  Ref Range    SODIUM 140 136 - 145 mmol/L    POTASSIUM 3.5 3.5 - 5.1 mmol/L    CHLORIDE 108 96 - 111 mmol/L    CO2 TOTAL 24 22 - 30 mmol/L    ANION GAP 8 4 - 13 mmol/L    CALCIUM 8.2 (L) 8.6 - 10.2 mg/dL    GLUCOSE 90 65 - 874 mg/dL    BUN 9 8 - 25 mg/dL    CREATININE 9.39 9.39 - 1.05 mg/dL    eGFRcr - FEMALE >09 >=60 mL/min/1.75m2    BUN/CREA RATIO 15 6 - 22   PHOSPHORUS    Collection Time: 02/17/24  5:26 AM    Result Value Ref Range    PHOSPHORUS 3.0 2.4 - 4.7 mg/dL   MAGNESIUM    Collection Time: 02/17/24  5:26 AM   Result Value Ref Range    MAGNESIUM 1.7 (L) 1.8 - 2.6 mg/dL   CBC/DIFF    Collection Time: 02/17/24  5:26 AM    Narrative    The following orders were created for panel order CBC/DIFF.  Procedure                               Abnormality         Status                     ---------                               -----------         ------                     CBC WITH IPQQ[[id number]]                Abnormal            Final result                 Please view results for these tests on the individual orders.   HEPATIC FUNCTION PANEL    Collection Time: 02/17/24  5:26 AM   Result Value Ref Range    ALBUMIN 3.5 3.5 - 5.0 g/dL    ALKALINE PHOSPHATASE 57 40 - 110 U/L    ALT (SGPT) 8 <31 U/L    AST (SGOT)  18 11 - 34 U/L    BILIRUBIN TOTAL 0.9 0.3 - 1.3 mg/dL    BILIRUBIN DIRECT 0.3 0.1 - 0.4 mg/dL    PROTEIN TOTAL 5.9 (L) 6.4 - 8.3 g/dL   C-REACTIVE PROTEIN(CRP),INFLAMMATION    Collection Time: 02/17/24  5:26 AM   Result Value Ref Range    CRP INFLAMMATION 10.3 (H) <8.0 mg/L   CBC WITH DIFF    Collection Time: 02/17/24  5:26 AM   Result Value Ref Range    WBC 13.7 (H) 3.7 - 11.0 x103/uL    RBC 4.00 3.85 - 5.22 x106/uL    HGB 12.7 11.5 - 16.0 g/dL    HCT 64.0 65.1 - 53.9 %    MCV 89.8 78.0 - 100.0 fL    MCH 31.8 26.0 - 32.0 pg    MCHC 35.4 31.0 - 35.5 g/dL    RDW-CV 87.5 88.4 - 84.4 %    PLATELETS 306 150 - 400 x103/uL    MPV 9.2 8.7 - 12.5 fL  NEUTROPHIL % 84.2 %    LYMPHOCYTE % 10.2 %    MONOCYTE % 4.2 %    EOSINOPHIL % 0.9 %    BASOPHIL % 0.2 %    NEUTROPHIL # 11.53 (H) 1.50 - 7.70 x103/uL    LYMPHOCYTE # 1.39 1.00 - 4.80 x103/uL    MONOCYTE # 0.57 0.20 - 1.10 x103/uL    EOSINOPHIL # 0.13 <=0.50 x103/uL    BASOPHIL # <0.10 <=0.20 x103/uL    IMMATURE GRANULOCYTE % 0.3 0.0 - 1.0 %    IMMATURE GRANULOCYTE # <0.10 <0.10 x103/uL   BASIC METABOLIC PANEL    Collection Time: 02/18/24  5:52 AM   Result Value  Ref Range    SODIUM 140 136 - 145 mmol/L    POTASSIUM 3.5 3.5 - 5.1 mmol/L    CHLORIDE 110 96 - 111 mmol/L    CO2 TOTAL 22 22 - 30 mmol/L    ANION GAP 8 4 - 13 mmol/L    CALCIUM 8.3 (L) 8.6 - 10.2 mg/dL    GLUCOSE 79 65 - 874 mg/dL    BUN 8 8 - 25 mg/dL    CREATININE 9.38 9.39 - 1.05 mg/dL    eGFRcr - FEMALE >09 >=60 mL/min/1.83m2    BUN/CREA RATIO 13 6 - 22   PHOSPHORUS    Collection Time: 02/18/24  5:52 AM   Result Value Ref Range    PHOSPHORUS 3.2 2.4 - 4.7 mg/dL   MAGNESIUM    Collection Time: 02/18/24  5:52 AM   Result Value Ref Range    MAGNESIUM 1.9 1.8 - 2.6 mg/dL   CBC/DIFF    Collection Time: 02/18/24  5:52 AM    Narrative    The following orders were created for panel order CBC/DIFF.  Procedure                               Abnormality         Status                     ---------                               -----------         ------                     CBC WITH IPQQ[[id number]]                Abnormal            Final result                 Please view results for these tests on the individual orders.   CBC WITH DIFF    Collection Time: 02/18/24  5:52 AM   Result Value Ref Range    WBC 7.0 3.7 - 11.0 x103/uL    RBC 3.86 3.85 - 5.22 x106/uL    HGB 12.1 11.5 - 16.0 g/dL    HCT 65.4 (L) 65.1 - 46.0 %    MCV 89.4 78.0 - 100.0 fL    MCH 31.3 26.0 - 32.0 pg    MCHC 35.1 31.0 - 35.5 g/dL    RDW-CV 87.3 88.4 - 84.4 %    PLATELETS 307 150 - 400 x103/uL    MPV 9.3 8.7 - 12.5 fL    NEUTROPHIL %  56.6 %    LYMPHOCYTE % 31.4 %    MONOCYTE % 7.1 %    EOSINOPHIL % 4.5 %    BASOPHIL % 0.1 %    NEUTROPHIL # 3.94 1.50 - 7.70 x103/uL    LYMPHOCYTE # 2.18 1.00 - 4.80 x103/uL    MONOCYTE # 0.49 0.20 - 1.10 x103/uL    EOSINOPHIL # 0.31 <=0.50 x103/uL    BASOPHIL # <0.10 <=0.20 x103/uL    IMMATURE GRANULOCYTE % 0.3 0.0 - 1.0 %    IMMATURE GRANULOCYTE # <0.10 <0.10 x103/uL   FERRITIN    Collection Time: 02/18/24  5:52 AM   Result Value Ref Range    FERRITIN 165 5 - 200 ng/mL   IRON  TRANSFERRIN AND TIBC    Collection  Time: 02/18/24  5:52 AM   Result Value Ref Range    TOTAL IRON  BINDING CAPACITY 221 (L) 252 - 504 ug/dL    IRON  (TRANSFERRIN) SATURATION 14 (L) 15 - 50 %    IRON  32 (L) 45 - 170 ug/dL    TRANSFERRIN 841 (L) 180 - 360 mg/dL    Narrative    In hereditary hemochromatosis, serum iron  is usually >150 ug/dL and % saturation is >39%. In advanced iron  overload, % saturation is often >90%.   CBC/DIFF    Collection Time: 02/19/24  7:38 AM    Narrative    The following orders were created for panel order CBC/DIFF.  Procedure                               Abnormality         Status                     ---------                               -----------         ------                     CBC WITH IPQQ[[id number]]                Abnormal            Final result                 Please view results for these tests on the individual orders.   BASIC METABOLIC PANEL    Collection Time: 02/19/24  7:38 AM   Result Value Ref Range    SODIUM 142 136 - 145 mmol/L    POTASSIUM 3.6 3.5 - 5.1 mmol/L    CHLORIDE 110 96 - 111 mmol/L    CO2 TOTAL 20 (L) 22 - 30 mmol/L    ANION GAP 12 4 - 13 mmol/L    CALCIUM 8.4 (L) 8.6 - 10.2 mg/dL    GLUCOSE 87 65 - 874 mg/dL    BUN 9 8 - 25 mg/dL    CREATININE 9.37 9.39 - 1.05 mg/dL    eGFRcr - FEMALE >09 >=60 mL/min/1.40m2    BUN/CREA RATIO 15 6 - 22   PHOSPHORUS    Collection Time: 02/19/24  7:38 AM   Result Value Ref Range    PHOSPHORUS 3.6 2.4 - 4.7 mg/dL   MAGNESIUM    Collection Time: 02/19/24  7:38 AM   Result Value Ref Range  MAGNESIUM 1.7 (L) 1.8 - 2.6 mg/dL   CBC WITH DIFF    Collection Time: 02/19/24  7:38 AM   Result Value Ref Range    WBC 6.4 3.7 - 11.0 x103/uL    RBC 3.77 (L) 3.85 - 5.22 x106/uL    HGB 11.8 11.5 - 16.0 g/dL    HCT 66.2 (L) 65.1 - 46.0 %    MCV 89.4 78.0 - 100.0 fL    MCH 31.3 26.0 - 32.0 pg    MCHC 35.0 31.0 - 35.5 g/dL    RDW-CV 87.6 88.4 - 84.4 %    PLATELETS 296 150 - 400 x103/uL    MPV 9.2 8.7 - 12.5 fL    NEUTROPHIL % 62.0 %    LYMPHOCYTE % 26.7 %    MONOCYTE % 7.8 %     EOSINOPHIL % 2.7 %    BASOPHIL % 0.5 %    NEUTROPHIL # 3.98 1.50 - 7.70 x103/uL    LYMPHOCYTE # 1.71 1.00 - 4.80 x103/uL    MONOCYTE # 0.50 0.20 - 1.10 x103/uL    EOSINOPHIL # 0.17 <=0.50 x103/uL    BASOPHIL # <0.10 <=0.20 x103/uL    IMMATURE GRANULOCYTE % 0.3 0.0 - 1.0 %    IMMATURE GRANULOCYTE # <0.10 <0.10 x103/uL   Results for orders placed or performed during the hospital encounter of 02/15/24 (from the past 12 weeks)   CBC/DIFF    Collection Time: 02/15/24 11:10 AM    Narrative    The following orders were created for panel order CBC/DIFF.  Procedure                               Abnormality         Status                     ---------                               -----------         ------                     CBC WITH IPQQ[[id number]]                Abnormal            Final result                 Please view results for these tests on the individual orders.   BASIC METABOLIC PANEL    Collection Time: 02/15/24 11:10 AM   Result Value Ref Range    SODIUM 139 136 - 145 mmol/L    POTASSIUM 3.7 3.5 - 5.1 mmol/L    CHLORIDE 106 96 - 111 mmol/L    CO2 TOTAL 25 22 - 30 mmol/L    ANION GAP 8 4 - 13 mmol/L    CALCIUM 9.0 8.6 - 10.2 mg/dL    GLUCOSE 99 65 - 874 mg/dL    BUN 9 8 - 25 mg/dL    CREATININE 9.39 9.39 - 1.05 mg/dL    eGFRcr - FEMALE >09 >=60 mL/min/1.28m2    BUN/CREA RATIO 15 6 - 22   HEPATIC FUNCTION PANEL    Collection Time: 02/15/24 11:10 AM   Result Value Ref Range    ALBUMIN 4.3 3.5 - 5.0 g/dL    ALKALINE  PHOSPHATASE 69 40 - 110 U/L    ALT (SGPT) 7 <31 U/L    AST (SGOT)  12 11 - 34 U/L    BILIRUBIN TOTAL 0.4 0.3 - 1.3 mg/dL    BILIRUBIN DIRECT 0.2 0.1 - 0.4 mg/dL    PROTEIN TOTAL 7.2 6.4 - 8.3 g/dL   SEDIMENTATION RATE    Collection Time: 02/15/24 11:10 AM   Result Value Ref Range    ERYTHROCYTE SEDIMENTATION RATE (ESR) 1 <20 mm/hr   C-REACTIVE PROTEIN(CRP),INFLAMMATION    Collection Time: 02/15/24 11:10 AM   Result Value Ref Range    CRP INFLAMMATION 0.4 <8.0 mg/L   CBC WITH DIFF     Collection Time: 02/15/24 11:10 AM   Result Value Ref Range    WBC 6.3 3.7 - 11.0 x103/uL    RBC 4.79 3.85 - 5.22 x106/uL    HGB 14.6 11.5 - 16.0 g/dL    HCT 56.2 65.1 - 53.9 %    MCV 91.2 78.0 - 100.0 fL    MCH 30.5 26.0 - 32.0 pg    MCHC 33.4 31.0 - 35.5 g/dL    RDW-CV 87.4 88.4 - 84.4 %    PLATELETS 363 150 - 400 x103/uL    MPV 8.6 (L) 8.7 - 12.5 fL    NEUTROPHIL % 60.3 %    LYMPHOCYTE % 33.1 %    MONOCYTE % 5.9 %    EOSINOPHIL % 0.3 %    BASOPHIL % 0.2 %    NEUTROPHIL # 3.77 1.50 - 7.70 x103/uL    LYMPHOCYTE # 2.07 1.00 - 4.80 x103/uL    MONOCYTE # 0.37 0.20 - 1.10 x103/uL    EOSINOPHIL # <0.04 <=0.50 x103/uL    BASOPHIL # <0.04 <=0.20 x103/uL    IMMATURE GRANULOCYTE % 0.2 0.0 - 1.0 %    IMMATURE GRANULOCYTE # <0.04 <0.10 x103/uL   Results for orders placed or performed in visit on 01/21/24 (from the past 12 weeks)   URINALYSIS, MACROSCOPIC AND MICROSCOPIC W/CULTURE REFLEX    Collection Time: 01/21/24  3:19 PM    Specimen: Urine, Straight Catheter    Narrative    The following orders were created for panel order URINALYSIS, MACROSCOPIC AND MICROSCOPIC W/CULTURE REFLEX.  Procedure                               Abnormality         Status                     ---------                               -----------         ------                     URINALYSIS, MACROSCOPIC[[id number]]                          Final result               URINALYSIS, MICROSCOPIC[[id number]]                          Final result                 Please view results for these tests  on the individual orders.   URINALYSIS, MACROSCOPIC    Collection Time: 01/21/24  3:19 PM   Result Value Ref Range    COLOR Yellow     APPEARANCE Clear     SPECIFIC GRAVITY 1.015 1.005 - 1.030    PH 7.5 5.0 - 8.0    LEUKOCYTES Negative Negative WBCs/uL    NITRITE Negative Negative    PROTEIN Negative Negative mg/dL    GLUCOSE Negative Negative mg/dL    KETONES Negative Negative mg/dL    UROBILINOGEN 0.2 0.2 - 1.0 mg/dL    BILIRUBIN Negative Negative mg/dL     BLOOD Negative Negative mg/dL   URINALYSIS, MICROSCOPIC    Collection Time: 01/21/24  3:19 PM   Result Value Ref Range    RBCS None Occasional or less, 0-2, 1-3, None /hpf    WBCS Occasional or less Occasional or less, 0-2, 1-3, None /hpf    BACTERIA None None /hpf    SQUAMOUS EPITHELIAL Rare /hpf     MRI Pelvis W/WO  Narrative: Holly Fritz    Female, 44 years old.    MRI PELVIS W/WO CONTRAST performed on 02/23/2024 10:55 AM.    REASON FOR EXAM: R19.07: Generalized abdominal or pelvic swelling or mass or lump, Generalized abdominal or pelvic swelling or mass or lump    INTRAVENOUS CONTRAST: 9 ml's of Prohance     TECHNIQUE: MR visceral pelvis with and without contrast.    COMPARISON: CT abdomen pelvis 02/15/2024 and 05/08/2018. Pelvic ultrasound 12/06/2021    FINDINGS:     The uterus is anteverted in position. There is a large subserosal fibroid which projects posteriorly off the body of the uterus measures 8 cm in diameter. There is also a 1.6 cm intramural fibroid within the posterior body of the uterus. Several additional small intramural fibroids include a 1 cm fibroid in the left posterior fundus, a 1.7 x 1.5 cm fibroid in the posterior uterine body, and a 1 cm fibroid in the right posterior uterine body. A 1.5 cm subserosal fibroid projects superiorly off the right uterine fundus. The endometrium is thin measuring just 2 mm with no endometrial mass. There is no thickening of the junctional zone which measures up to 7 mm. Multiple nabothian cysts are incidentally noted.    The right ovary measured 4.7 x 2.7 x 1.6 cm. It contains several follicles measuring up to 1.5 cm. The left ovary measured 3.7 x 1.8 x 2.4 cm. It is normal in appearance with no concerning ovarian mass. There is a small amount of free fluid in the left adnexa as well as in the pelvic cul-de-sac. Some septations are visible in the pelvic fluid suggesting mild complexity.    Urinary bladder is normal in appearance. No enlarged pelvic  lymph nodes are visualized.  Impression: Multiple uterine fibroids including a large 8 cm subserosal fibroid projecting off the posterior uterine body.    Thin endometrium.    Normal appearance of the ovaries.    Small amount of mildly complex free fluid in the pelvis.    Radiologist location ID: [id number]     Physical Exam none performed today extensively performed at the time of intake.  Last Pathology/Cytology Result   SURGICAL PATHOLOGY SPECIMEN (Collected: 01/17/2023  2:52 PM)   Result Value Ref Range    Final Diagnosis       A. ESOPHAGUS, DISTAL, BIOPSY:  - Squamous mucosa with reflux and reactive changes.  B. ESOPHAGUS, PROXIMAL, BIOPSY:  - Squamous mucosa with reactive changes.  Diagnosis Comment       There is no increased intraepithelial eosinophils noted.  Correlation with clinical and endoscopic findings is advised.      Microscopic Description       Performed.  Unless otherwise noted, all tissue is formalin fixed and paraffin embedded.      Gross Description       A: Esophagus  Part A is received in formalin, labeled Fritz, Holly and Esophagus distal. It consists of 4 tan-brown pieces of tissue which are 0.3 cm each.  The specimen is submitted entirely in cassette A1.      B: Esophagus  Part B is received in formalin, labeled Fritz, Holly and Esophagus proximal. It consists of 4 tan-brown pieces of tissue which are 0.2 cm each.  The specimen is submitted entirely in cassette B1.            Assessment & Plan  Recurrent uterine fibroids  She has multiple uterine fibroids, including a large subserosal fibroid (8 cm) and several smaller intramural and posterior fibroids, causing chronic pelvic pain and menorrhagia. Imaging shows no malignancy. Previous treatments, including leuprolide and endometrial ablation, were ineffective. The fibroid burden significantly impacts her quality of life. A hysteroscopy is scheduled to evaluate the uterine cavity and obtain endometrial samples. A  cystoscopy and examination under anesthesia are planned to further assess pelvic pathology. Future hysterectomy options, including surgical route and management of bladder and ovarian pathology, were discussed.    Mixed urinary incontinence  She experiences urinary leakage with positional changes, coughing, and laughing. Bladder function will be evaluated during the scheduled cystoscopy and examination under anesthesia. The possibility of addressing urinary incontinence during a hysterectomy, if indicated, was discussed.    Endometriosis  Endometriosis was confirmed by prior laparoscopy and ablation, with ongoing pelvic pain and burning sensations suggesting recurrence. Imaging and surgical history indicate significant pelvic adhesions and possible scar tissue. A hysteroscopy and examination under anesthesia are scheduled to assess for active endometriosis and pelvic adhesions. Endometriosis and associated scarring may be addressed during future surgical intervention, such as a hysterectomy.     Orders Placed This Encounter    XR CHEST PA AND LATERAL    CBC/DIFF    COMPREHENSIVE METABOLIC PNL, FASTING    HGA1C (HEMOGLOBIN A1C WITH EST AVG GLUCOSE)    URINALYSIS, MACROSCOPIC AND MICROSCOPIC W/CULTURE REFLEX    THYROID  STIMULATING HORMONE WITH FREE T4 REFLEX    SEDIMENTATION RATE    C-REACTIVE PROTEIN(CRP),INFLAMMATION    HIV1/HIV2 SCREEN, COMBINED ANTIGEN AND ANTIBODY    HEPATITIS A (HAV) IGM ANTIBODY    HEPATITIS A (HAV) IGG ANTIBODY    HEPATITIS B SURFACE ANTIGEN    HEPATITIS B CORE IGM, AB    HEPATITIS B CORE ANTIBODY    HEPATITIS B SURFACE ANTIBODY    HEPATITIS C VIRUS (HCV) RNA DETECTION AND QUANTIFICATION, PCR, PLASMA    HEPATITIS C ANTIBODY SCREEN WITH REFLEX TO HCV PCR    FERRITIN    VITAMIN B12    LIPID PANEL    CAROTENE, BETA    VITAMIN D  25 TOTAL    AMYLASE    LIPASE    ECG 12 LEAD    POCT OCCULT BLOOD FOR STOOL    CASE REQUEST SURGICAL: Cystoscopy with biopsy, Cystoscopy with fulguration, Hysteroscopy  with dilatation and curettage, Myomectomy with potential intrauterine morcellation        ICD-10-CM    1. Gastroparesis  K31.84     Encouraged to start Reglan  also should consider  curtailing off anticholinergic therapy of which there are multiple      2. Malnutrition of moderate degree (CMS HCC)  E44.0 CBC/DIFF     COMPREHENSIVE METABOLIC PNL, FASTING     HGA1C (HEMOGLOBIN A1C WITH EST AVG GLUCOSE)     URINALYSIS, MACROSCOPIC AND MICROSCOPIC W/CULTURE REFLEX     THYROID  STIMULATING HORMONE WITH FREE T4 REFLEX     SEDIMENTATION RATE     C-REACTIVE PROTEIN(CRP),INFLAMMATION     HIV1/HIV2 SCREEN, COMBINED ANTIGEN AND ANTIBODY     HEPATITIS A (HAV) IGM ANTIBODY     HEPATITIS A (HAV) IGG ANTIBODY     HEPATITIS B SURFACE ANTIGEN     HEPATITIS B CORE IGM, AB     HEPATITIS B CORE ANTIBODY     HEPATITIS B SURFACE ANTIBODY     HEPATITIS C VIRUS (HCV) RNA DETECTION AND QUANTIFICATION, PCR, PLASMA     HEPATITIS C ANTIBODY SCREEN WITH REFLEX TO HCV PCR     XR CHEST PA AND LATERAL     POCT OCCULT BLOOD FOR STOOL     FERRITIN     VITAMIN B12     LIPID PANEL     CAROTENE, BETA     VITAMIN D  25 TOTAL     AMYLASE     LIPASE    Workup needed.      3. 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 H/O pelvic mass  Z87.898 CASE REQUEST SURGICAL: Cystoscopy with biopsy, Cystoscopy with fulguration, Hysteroscopy with dilatation and curettage, Myomectomy with potential intrauterine morcellation      5. Pelvic mass in female  R19.00       6. 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 Pelvic pain  R10.20       8. Fibroids, intramural  D25.1       9. Endometriosis determined by laparoscopy  N80.9       10. Body mass index (BMI) 19.9 or less, adult  Z68.1       11. Preop examination  Z01.818 ECG 12 LEAD      12. Esophageal dysmotility  K22.4     Currently mild             I want to thank you very much for allowing me to participate in the care of  Holly Fritz if you have any questions regarding same please do not hesitate to contact me.  Consent for the surgical procedure obtained preoperative orders written.  Was encouraged to institute Reglan  therapy and workup for malabsorption/malnutrition performed.  BMI is 17.16.  This is significantly underweight.      Aleene Barber, DO  03/10/2024, 11:18    On the day of the encounter, a total of  68 minutes was spent on this patient encounter including review of historical information, examination, documentation and post-visit activities.      This note was created with assistance from [employer] via capture of conversational audio. Consent was obtained from the patient and all parties present prior to recording.                [1]   Social History  Tobacco Use    Smoking status: Every Day     Current packs/day: 0.50     Average packs/day: 0.5 packs/day for 26.1 years (13.0 ttl pk-yrs)     Types:  Cigarettes     Start date: 2000     Passive exposure: Current (smoker)    Smokeless tobacco: Never    Tobacco comments:     1.5 ppd   Vaping Use    Vaping status: Never Used   Substance Use Topics    Alcohol use: Not Currently    Drug use: Yes     Types: Marijuana     Comment: daily   [2]   Allergies  Allergen Reactions    Adhesive Rash    Doxycycline Rash     Agitation      Propranolol Hives/ Urticaria, Itching and Rash    Ciprofloxacin     Gabapentin   Other Adverse Reaction (Add comment)     Patient reports is sensitive to medication.    Levaquin [Levofloxacin]     Penicillins      unknown reaction when an infant - HAS TOLERATED KEFLEX     Tapentadol      Patient reports sensitivity to medication.

## 2024-03-12 ENCOUNTER — Telehealth (INDEPENDENT_AMBULATORY_CARE_PROVIDER_SITE_OTHER): Payer: Self-pay | Admitting: Gastroenterology

## 2024-03-12 ENCOUNTER — Telehealth (HOSPITAL_COMMUNITY): Payer: Self-pay

## 2024-03-12 NOTE — Telephone Encounter (Addendum)
 Attempted to call patient and patient did not answer. Able to leave voicemail at this time asking about following up with dysphagia and any improvements with hycosamine. Will Follow up on 03/15/24.      Norleen Birmingham, NURSE COORDINATOR

## 2024-03-12 NOTE — Telephone Encounter (Signed)
LVM for patient to call me back to schedule surgery.

## 2024-03-15 ENCOUNTER — Telehealth (INDEPENDENT_AMBULATORY_CARE_PROVIDER_SITE_OTHER): Payer: Self-pay | Admitting: Gastroenterology

## 2024-03-15 NOTE — Telephone Encounter (Signed)
 Attempted to call patient and patient did not answer. Able to leave voicemail at this time asking about hycosamine and improvements in dysphagia. Will continue to follow.      Norleen Birmingham, NURSE COORDINATOR

## 2024-03-30 ENCOUNTER — Ambulatory Visit (INDEPENDENT_AMBULATORY_CARE_PROVIDER_SITE_OTHER): Admitting: NEUROLOGY

## 2024-04-20 ENCOUNTER — Ambulatory Visit (INDEPENDENT_AMBULATORY_CARE_PROVIDER_SITE_OTHER): Payer: Self-pay | Admitting: Family

## 2024-04-21 ENCOUNTER — Ambulatory Visit (HOSPITAL_COMMUNITY)

## 2024-04-21 ENCOUNTER — Ambulatory Visit

## 2024-05-03 ENCOUNTER — Ambulatory Visit (HOSPITAL_BASED_OUTPATIENT_CLINIC_OR_DEPARTMENT_OTHER): Payer: Self-pay

## 2024-05-06 ENCOUNTER — Ambulatory Visit (INDEPENDENT_AMBULATORY_CARE_PROVIDER_SITE_OTHER): Payer: Self-pay | Admitting: OTOLARYNGOLOGY

## 2024-05-17 ENCOUNTER — Ambulatory Visit: Admit: 2024-05-17 | Payer: Self-pay

## 2024-05-17 ENCOUNTER — Encounter (HOSPITAL_COMMUNITY): Payer: Self-pay

## 2024-05-17 DIAGNOSIS — D251 Intramural leiomyoma of uterus: Secondary | ICD-10-CM | POA: Insufficient documentation

## 2024-05-17 DIAGNOSIS — R102 Pelvic and perineal pain unspecified side: Secondary | ICD-10-CM | POA: Insufficient documentation

## 2024-05-17 DIAGNOSIS — N393 Stress incontinence (female) (male): Secondary | ICD-10-CM | POA: Insufficient documentation

## 2024-05-17 DIAGNOSIS — N809 Endometriosis, unspecified: Secondary | ICD-10-CM | POA: Insufficient documentation

## 2024-05-17 DIAGNOSIS — R1900 Intra-abdominal and pelvic swelling, mass and lump, unspecified site: Secondary | ICD-10-CM | POA: Insufficient documentation
